# Patient Record
Sex: Male | Born: 1965 | Race: Black or African American | Hispanic: No | Marital: Single | Smoking: Never smoker
Health system: Southern US, Community
[De-identification: ages and names within clinical notes are randomized; demographics above are authoritative.]

## PROBLEM LIST (undated history)

## (undated) DIAGNOSIS — G709 Myoneural disorder, unspecified: Secondary | ICD-10-CM

## (undated) DIAGNOSIS — E11610 Type 2 diabetes mellitus with diabetic neuropathic arthropathy: Secondary | ICD-10-CM

## (undated) DIAGNOSIS — K219 Gastro-esophageal reflux disease without esophagitis: Secondary | ICD-10-CM

## (undated) DIAGNOSIS — K5900 Constipation, unspecified: Secondary | ICD-10-CM

## (undated) DIAGNOSIS — E785 Hyperlipidemia, unspecified: Secondary | ICD-10-CM

## (undated) DIAGNOSIS — I509 Heart failure, unspecified: Secondary | ICD-10-CM

## (undated) DIAGNOSIS — N189 Chronic kidney disease, unspecified: Secondary | ICD-10-CM

## (undated) DIAGNOSIS — R519 Headache, unspecified: Secondary | ICD-10-CM

## (undated) DIAGNOSIS — I10 Essential (primary) hypertension: Secondary | ICD-10-CM

## (undated) HISTORY — DX: Chronic kidney disease, unspecified: N18.9

## (undated) HISTORY — DX: Constipation, unspecified: K59.00

## (undated) HISTORY — PX: CARDIAC CATHETERIZATION: SHX172

## (undated) HISTORY — DX: Hyperlipidemia, unspecified: E78.5

## (undated) HISTORY — DX: Heart failure, unspecified: I50.9

## (undated) HISTORY — DX: Myoneural disorder, unspecified: G70.9

## (undated) HISTORY — DX: Gastro-esophageal reflux disease without esophagitis: K21.9

---

## 1998-10-17 ENCOUNTER — Emergency Department (HOSPITAL_COMMUNITY): Admission: EM | Admit: 1998-10-17 | Discharge: 1998-10-17 | Payer: Self-pay | Admitting: Emergency Medicine

## 1998-11-30 ENCOUNTER — Encounter: Admission: RE | Admit: 1998-11-30 | Discharge: 1998-11-30 | Payer: Self-pay | Admitting: Internal Medicine

## 2000-12-30 HISTORY — PX: UPPER GASTROINTESTINAL ENDOSCOPY: SHX188

## 2001-01-29 ENCOUNTER — Ambulatory Visit (HOSPITAL_COMMUNITY): Admission: RE | Admit: 2001-01-29 | Discharge: 2001-01-29 | Payer: Self-pay | Admitting: Gastroenterology

## 2001-03-31 ENCOUNTER — Emergency Department (HOSPITAL_COMMUNITY): Admission: EM | Admit: 2001-03-31 | Discharge: 2001-03-31 | Payer: Self-pay | Admitting: Emergency Medicine

## 2001-03-31 ENCOUNTER — Encounter: Payer: Self-pay | Admitting: Emergency Medicine

## 2011-07-17 ENCOUNTER — Observation Stay (HOSPITAL_COMMUNITY)
Admission: EM | Admit: 2011-07-17 | Discharge: 2011-07-18 | Disposition: A | Discharge status: Home / Self Care | Payer: Self-pay | Attending: Emergency Medicine | Admitting: Emergency Medicine

## 2011-07-17 DIAGNOSIS — L03221 Cellulitis of neck: Secondary | ICD-10-CM | POA: Insufficient documentation

## 2011-07-17 DIAGNOSIS — E119 Type 2 diabetes mellitus without complications: Secondary | ICD-10-CM | POA: Insufficient documentation

## 2011-07-17 DIAGNOSIS — L0211 Cutaneous abscess of neck: Principal | ICD-10-CM | POA: Insufficient documentation

## 2011-07-17 LAB — GLUCOSE, CAPILLARY: Glucose-Capillary: 164 mg/dL — ABNORMAL HIGH (ref 70–99)

## 2011-07-18 LAB — URINALYSIS, ROUTINE W REFLEX MICROSCOPIC
Leukocytes, UA: NEGATIVE
Protein, ur: NEGATIVE mg/dL
Urobilinogen, UA: 0.2 mg/dL (ref 0.0–1.0)

## 2011-07-18 LAB — DIFFERENTIAL
Basophils Absolute: 0 10*3/uL (ref 0.0–0.1)
Basophils Relative: 0 % (ref 0–1)
Eosinophils Absolute: 0.1 10*3/uL (ref 0.0–0.7)
Eosinophils Relative: 1 % (ref 0–5)
Lymphocytes Relative: 17 % (ref 12–46)
Lymphs Abs: 1.6 10*3/uL (ref 0.7–4.0)
Monocytes Absolute: 0.6 10*3/uL (ref 0.1–1.0)
Monocytes Relative: 7 % (ref 3–12)
Neutro Abs: 6.8 10*3/uL (ref 1.7–7.7)
Neutrophils Relative %: 74 % (ref 43–77)

## 2011-07-18 LAB — GLUCOSE, CAPILLARY
Glucose-Capillary: 317 mg/dL — ABNORMAL HIGH (ref 70–99)
Glucose-Capillary: 302 mg/dL — ABNORMAL HIGH (ref 70–99)

## 2011-07-18 LAB — BASIC METABOLIC PANEL
BUN: 16 mg/dL (ref 6–23)
CO2: 24 mEq/L (ref 19–32)
Calcium: 9.4 mg/dL (ref 8.4–10.5)
Chloride: 97 mEq/L (ref 96–112)
Creatinine, Ser: 1.09 mg/dL (ref 0.50–1.35)
GFR calc Af Amer: >60 mL/min (ref >60)
GFR calc non Af Amer: >60 mL/min (ref >60)
Glucose, Bld: 352 mg/dL — ABNORMAL HIGH (ref 70–99)
Potassium: 4.3 mEq/L (ref 3.5–5.1)
Sodium: 132 mEq/L — ABNORMAL LOW (ref 135–145)

## 2011-07-18 LAB — CBC
HCT: 41.7 % (ref 39.0–52.0)
Hemoglobin: 14.6 g/dL (ref 13.0–17.0)
MCH: 28.6 pg (ref 26.0–34.0)
MCHC: 35 g/dL (ref 30.0–36.0)
MCV: 81.6 fL (ref 78.0–100.0)
Platelets: 353 10*3/uL (ref 150–400)
RBC: 5.11 MIL/uL (ref 4.22–5.81)
RDW: 13.6 % (ref 11.5–15.5)
WBC: 9.1 10*3/uL (ref 4.0–10.5)

## 2011-07-18 LAB — URINE MICROSCOPIC-ADD ON

## 2011-07-19 LAB — URINE CULTURE
Culture  Setup Time: 201207191221
Culture: NO GROWTH

## 2012-05-04 ENCOUNTER — Emergency Department (HOSPITAL_COMMUNITY)
Admission: EM | Admit: 2012-05-04 | Discharge: 2012-05-04 | Disposition: A | Discharge status: Home / Self Care | Payer: Self-pay | Attending: Emergency Medicine | Admitting: Emergency Medicine

## 2012-05-04 ENCOUNTER — Encounter (HOSPITAL_COMMUNITY): Payer: Self-pay | Admitting: Adult Health

## 2012-05-04 DIAGNOSIS — L0291 Cutaneous abscess, unspecified: Secondary | ICD-10-CM | POA: Insufficient documentation

## 2012-05-04 DIAGNOSIS — E1169 Type 2 diabetes mellitus with other specified complication: Secondary | ICD-10-CM | POA: Insufficient documentation

## 2012-05-04 DIAGNOSIS — I10 Essential (primary) hypertension: Secondary | ICD-10-CM | POA: Insufficient documentation

## 2012-05-04 DIAGNOSIS — L0390 Cellulitis, unspecified: Secondary | ICD-10-CM

## 2012-05-04 DIAGNOSIS — E11621 Type 2 diabetes mellitus with foot ulcer: Secondary | ICD-10-CM

## 2012-05-04 DIAGNOSIS — Z79899 Other long term (current) drug therapy: Secondary | ICD-10-CM | POA: Insufficient documentation

## 2012-05-04 DIAGNOSIS — L97509 Non-pressure chronic ulcer of other part of unspecified foot with unspecified severity: Secondary | ICD-10-CM | POA: Insufficient documentation

## 2012-05-04 HISTORY — DX: Essential (primary) hypertension: I10

## 2012-05-04 LAB — POCT I-STAT, CHEM 8
Calcium, Ion: 1.23 mmol/L (ref 1.12–1.32)
Chloride: 108 mEq/L (ref 96–112)
HCT: 42 % (ref 39.0–52.0)
Hemoglobin: 14.3 g/dL (ref 13.0–17.0)
TCO2: 26 mmol/L (ref 0–100)

## 2012-05-04 LAB — CBC
HCT: 41.3 % (ref 39.0–52.0)
MCH: 28.1 pg (ref 26.0–34.0)
MCHC: 33.2 g/dL (ref 30.0–36.0)
RDW: 14.2 % (ref 11.5–15.5)

## 2012-05-04 LAB — DIFFERENTIAL
Basophils Absolute: 0.1 10*3/uL (ref 0.0–0.1)
Basophils Relative: 1 % (ref 0–1)
Eosinophils Absolute: 0.2 10*3/uL (ref 0.0–0.7)
Monocytes Absolute: 0.5 10*3/uL (ref 0.1–1.0)
Neutro Abs: 3.5 10*3/uL (ref 1.7–7.7)

## 2012-05-04 MED ORDER — CEPHALEXIN 500 MG PO CAPS: 500.0000 mg | ORAL_CAPSULE | Freq: Four times a day (QID) | ORAL | Status: AC

## 2012-05-04 MED ORDER — SULFAMETHOXAZOLE-TMP DS 800-160 MG PO TABS
1.0000 | ORAL_TABLET | Freq: Once | ORAL | Status: AC
  Administered 2012-05-04: 1 via ORAL
  Filled 2012-05-04: qty 1

## 2012-05-04 MED ORDER — SULFAMETHOXAZOLE-TRIMETHOPRIM 800-160 MG PO TABS: 1.0000 | ORAL_TABLET | Freq: Two times a day (BID) | ORAL | Status: AC

## 2012-05-04 MED ORDER — CEPHALEXIN 250 MG PO CAPS
500.0000 mg | ORAL_CAPSULE | Freq: Once | ORAL | Status: AC
  Administered 2012-05-04: 500 mg via ORAL
  Filled 2012-05-04: qty 2

## 2012-05-04 NOTE — ED Notes (Signed)
Bilateral ankle swelling that began Friday, able to ambulate well, denies pain. CMS intact. Denies SOB and CP.

## 2012-05-04 NOTE — ED Notes (Signed)
Pt requesting something to eat and drink.  Before giving pain med asked pt who was driving st's she had to call when she was discharged.  Explained to pt that she would not be able to drive after narcotics was given.  Pt voices understanding.  Pt requesting to know what kind of pain med she was getting and why was I diluting it.

## 2012-05-04 NOTE — Discharge Instructions (Signed)

## 2012-05-04 NOTE — ED Provider Notes (Addendum)
This chart was scribed for Gwyneth Sprout, MD by Williemae Natter. The patient was seen in room STRE3/STRE3 at 5:32 PM.  History     CSN: [id number]  Arrival date & time 05/04/12  1607   First MD Initiated Contact with Patient 05/04/12 1730      Chief Complaint  Patient presents with  . Ankle Pain    (Consider location/radiation/quality/duration/timing/severity/associated sxs/prior treatment) HPI Raymond Ryan is a 46 y.o. male with a hx of diabetes who presents to the Emergency Department complaining of bilateral ankle swelling that started 2 days ago. Pt was wearing work boots that scraped the skin on outer ankles shortly before onset.  Pt does not have a fever but has a headache.  Past Medical History  Diagnosis Date  . Diabetes mellitus   . Hypertension     History reviewed. No pertinent past surgical history.  History reviewed. No pertinent family history.  History  Substance Use Topics  . Smoking status: Never Smoker   . Smokeless tobacco: Not on file  . Alcohol Use: No      Review of Systems  Constitutional: Negative for fever and chills.  Respiratory: Negative for shortness of breath.   Cardiovascular: Positive for leg swelling.  Gastrointestinal: Negative for nausea and vomiting.  Neurological: Negative for weakness.    Allergies  Review of patient's allergies indicates no known allergies.  Home Medications   Current Outpatient Rx  Name Route Sig Dispense Refill  . GLIPIZIDE 10 MG PO TABS Oral Take 10 mg by mouth 2 (two) times daily before a meal.    . LISINOPRIL 10 MG PO TABS Oral Take 10 mg by mouth daily.    Marland Kitchen METFORMIN HCL 1000 MG PO TABS Oral Take 1,000 mg by mouth 2 (two) times daily with a meal.      BP 155/99  Pulse 90  Temp(Src) 98.4 F (36.9 C) (Oral)  Resp 20  SpO2 97%  Physical Exam  Nursing note and vitals reviewed. Constitutional: He is oriented to person, place, and time. He appears well-developed and well-nourished. No  distress.  HENT:  Head: Normocephalic and atraumatic.  Eyes: EOM are normal.  Neck: Normal range of motion. Neck supple. No tracheal deviation present.  Cardiovascular: Normal rate, regular rhythm and normal heart sounds.   Pulmonary/Chest: Effort normal and breath sounds normal. No respiratory distress.  Musculoskeletal: Normal range of motion. He exhibits edema (non pitting edema in bilateral ankles and feet. ). He exhibits no tenderness.       Dime sized lesions on bilateral lateral malleoli Mild erythema and warmth on rt foot  Neurological: He is alert and oriented to person, place, and time.  Skin: Skin is warm and dry.  Psychiatric: He has a normal mood and affect. His behavior is normal.    ED Course  Procedures (including critical care time)  Labs Reviewed  POCT I-STAT, CHEM 8 - Abnormal; Notable for the following:    Glucose, Bld 177 (*)    All other components within normal limits  CBC  DIFFERENTIAL   No results found.   No diagnosis found.    MDM   Patient who is diabetic who now has 2 lesions on bilateral ankles after wearing tight fitting shoes. The right foot is swollen and mild erythema with some cellulitis. There's no drainage from either lesion patient is having no systemic symptoms and labs are within normal limits. Start patient on Keflex and Bactrim to cover or strep and staph. Patient was  instructed to not wear any tight fitting shoes and to elevate his foot when he can. He is to return here in 2 days for a recheck to insure that the erythema and swelling is improving.  I personally performed the services described in this documentation, which was scribed in my presence.  The recorded information has been reviewed and considered.        Gwyneth Sprout, MD 05/04/12 1914  Gwyneth Sprout, MD 05/04/12 1850

## 2012-05-04 NOTE — ED Notes (Signed)
PT st's he had been wearing boots at this job now has wound on right ankle with swelling.  Pt denies pain

## 2012-05-07 ENCOUNTER — Encounter (HOSPITAL_COMMUNITY): Payer: Self-pay | Admitting: Emergency Medicine

## 2012-05-07 ENCOUNTER — Emergency Department (HOSPITAL_COMMUNITY)
Admission: EM | Admit: 2012-05-07 | Discharge: 2012-05-07 | Disposition: A | Discharge status: Home / Self Care | Payer: Self-pay | Attending: Emergency Medicine | Admitting: Emergency Medicine

## 2012-05-07 DIAGNOSIS — I10 Essential (primary) hypertension: Secondary | ICD-10-CM | POA: Insufficient documentation

## 2012-05-07 DIAGNOSIS — L97509 Non-pressure chronic ulcer of other part of unspecified foot with unspecified severity: Secondary | ICD-10-CM | POA: Insufficient documentation

## 2012-05-07 DIAGNOSIS — R609 Edema, unspecified: Secondary | ICD-10-CM | POA: Insufficient documentation

## 2012-05-07 DIAGNOSIS — E1169 Type 2 diabetes mellitus with other specified complication: Secondary | ICD-10-CM | POA: Insufficient documentation

## 2012-05-07 DIAGNOSIS — E11621 Type 2 diabetes mellitus with foot ulcer: Secondary | ICD-10-CM

## 2012-05-07 LAB — GLUCOSE, CAPILLARY: Glucose-Capillary: 200 mg/dL — ABNORMAL HIGH (ref 70–99)

## 2012-05-07 NOTE — ED Notes (Signed)
PT. REPORTS PERSISTENT PAIN / SWELLING / DRAINAGE AT RIGHT LATERAL DIABETIC ANKLE ULCER , SEEN HERE LAST Tuesday PRESCRIBED WITH KEFLEX WITH NO IMPROVEMENT.

## 2012-05-07 NOTE — Discharge Instructions (Signed)
Follow up with your family doctor.  If you need to find a new family doctor, call [facility], [phone] for assistance.  There are several doctors in [place] who see patient's who do not have medical insurance.  Please return to the ER if you develop fever, shortness of breath, Diabetes and Foot Care Diabetes may cause you to have a poor blood supply (circulation) to your legs and feet. Because of this, the skin may be thinner, break easier, and heal more slowly. You also may have nerve damage in your legs and feet causing decreased feeling. You may not notice minor injuries to your feet that could lead to serious problems or infections. Taking care of your feet is one of the most important things you can do for yourself.  HOME CARE INSTRUCTIONS  Do not go barefoot. Bare feet are easily injured.   Check your feet daily for blisters, cuts, and redness.   Wash your feet with warm water (not hot) and mild soap. Pat your feet and between your toes until completely dry.   Apply a moisturizing lotion that does not contain alcohol or petroleum jelly to the dry skin on your feet and to dry brittle toenails. Do not put it between your toes.   Trim your toenails straight across. Do not dig under them or around the cuticle.   Do not cut corns or calluses, or try to remove them with medicine.   Wear clean cotton socks or stockings every day. Make sure they are not too tight. Do not wear knee high stockings since they may decrease blood flow to your legs.   Wear leather shoes that fit properly and have enough cushioning. To break in new shoes, wear them just a few hours a day to avoid injuring your feet.   Wear shoes at all times, even in the house.   Do not cross your legs. This may decrease the blood flow to your feet.   If you find a minor scrape, cut, or break in the skin on your feet, keep it and the skin around it clean and dry. These areas may be cleansed with mild soap and water. Do not use  peroxide, alcohol, iodine or Merthiolate.   When you remove an adhesive bandage, be sure not to harm the skin around it.   If you have a wound, look at it several times a day to make sure it is healing.   Do not use heating pads or hot water bottles. Burns can occur. If you have lost feeling in your feet or legs, you may not know it is happening until it is too late.   Report any cuts, sores or bruises to your caregiver. Do not wait!  SEEK MEDICAL CARE IF:   You have an injury that is not healing or you notice redness, numbness, burning, or tingling.   Your feet always feel cold.   You have pain or cramps in your legs and feet.  SEEK IMMEDIATE MEDICAL CARE IF:   There is increasing redness, swelling, or increasing pain in the wound.   There is a red line that goes up your leg.   Pus is coming from a wound.   You develop an unexplained oral temperature above 102 F (38.9 C), or as your caregiver suggests.   You notice a bad smell coming from an ulcer or wound.  MAKE SURE YOU:   Understand these instructions.   Will watch your condition.   Will get help right away  if you are not doing well or get worse.  Document Released: 12/13/2000 Document Revised: 12/05/2011 Document Reviewed: 06/21/2009 [employer] Patient Information 2012 [employer].worsening swelling, redness of skin around wounds or drainage of pus from wounds.

## 2012-05-07 NOTE — ED Notes (Signed)
The patient's CBG was 200.

## 2012-05-07 NOTE — ED Provider Notes (Signed)
History     CSN: [id number]  Arrival date & time 05/07/12  4098   First MD Initiated Contact with Patient 05/07/12 [phone]      Chief Complaint  Patient presents with  . Wound Infection    (Consider location/radiation/quality/duration/timing/severity/associated sxs/prior treatment) HPI History provided by pt and prior chart.  Per prior chart, pt presented to ED 2 days ago w/ 2 days of bilateral ankle edema and diabetic ulcers of lateral malleoli that were attributed to tight-fitting work boots.  On exam, pt had bilateral cellulitis of right ankle and was d/c'd home w/ bactrim and keflex.  BUN/Cr were normal.  Pt returns today for recheck.  He has been compliant w/ abx and foot elevation.  Reports that his swelling is mildly improved.  There is no pain in his feet/ankles but he has peripheral neuropathy.  No drainage from wounds.  Has not had fever, cough or SOB.  No h/o CHF.   Past Medical History  Diagnosis Date  . Diabetes mellitus   . Hypertension     History reviewed. No pertinent past surgical history.  No family history on file.  History  Substance Use Topics  . Smoking status: Never Smoker   . Smokeless tobacco: Not on file  . Alcohol Use: No      Review of Systems  All other systems reviewed and are negative.    Allergies  Review of patient's allergies indicates no known allergies.  Home Medications   Current Outpatient Rx  Name Route Sig Dispense Refill  . CEPHALEXIN 500 MG PO CAPS Oral Take 1 capsule (500 mg total) by mouth 4 (four) times daily. 20 capsule 0  . GLIPIZIDE 10 MG PO TABS Oral Take 10 mg by mouth 2 (two) times daily before a meal.    . LISINOPRIL 10 MG PO TABS Oral Take 10 mg by mouth daily.    Marland Kitchen METFORMIN HCL 1000 MG PO TABS Oral Take 1,000 mg by mouth 2 (two) times daily with a meal.    . SULFAMETHOXAZOLE-TRIMETHOPRIM 800-160 MG PO TABS Oral Take 1 tablet by mouth every 12 (twelve) hours. 10 tablet 0    BP 134/85  Pulse 88  Temp(Src) 98.2  F (36.8 C) (Oral)  Resp 14  SpO2 100%  Physical Exam  Nursing note and vitals reviewed. Constitutional: He is oriented to person, place, and time. He appears well-developed and well-nourished. No distress.  HENT:  Head: Normocephalic and atraumatic.  Eyes:       Normal appearance  Neck: Normal range of motion.  Cardiovascular: Normal rate and regular rhythm.   Pulmonary/Chest: Effort normal and breath sounds normal. No respiratory distress. He has no rales.  Musculoskeletal: Normal range of motion.       Bilateral 2+ ankle edema.  Shallow, 2cm ulcerations w/out drainage or surrounding erythema/edema bilateral lateral malleoli.  Ankles and feet non-tender.  2+ DP pulses.  No sensation in toes which patient reports is chronic.    Neurological: He is alert and oriented to person, place, and time.  Skin: Skin is warm and dry. No rash noted.  Psychiatric: He has a normal mood and affect. His behavior is normal.    ED Course  Procedures (including critical care time)  Labs Reviewed - No data to display No results found.   1. Diabetic foot ulcer   2. Peripheral edema       MDM  45yo M presents for cellulitis recheck.  Has small, shallow diabetic ulcerations bilateral malleoli and  symmetric foot/ankle edema.  2 days ago, there was erythema of right foot and lateral malleolus.  Pt has been compliant w/ abx and this appears to have resolved.  He reports that his swelling is mildly improved as well.  No recent fever nor SOB/cough.  Basic labs checked 2 days ago and unremarkable.  Pt d/c'd home w/ recommendation to continue abx and leg elevation and f/u with his PCP.  Advised to return if swelling works or he develops SOB.          Otilio Miu, [place] 05/07/12 [phone]

## 2012-05-07 NOTE — ED Notes (Signed)
PA back at bedside to answer questions.

## 2012-05-08 NOTE — ED Provider Notes (Signed)
Medical screening examination/treatment/procedure(s) were performed by non-physician practitioner and as supervising physician I was immediately available for consultation/collaboration.   Glynn Octave, MD 05/08/12 [phone]

## 2012-09-06 ENCOUNTER — Encounter (HOSPITAL_COMMUNITY): Payer: Self-pay | Admitting: Emergency Medicine

## 2012-09-06 ENCOUNTER — Emergency Department (HOSPITAL_COMMUNITY)
Admission: EM | Admit: 2012-09-06 | Discharge: 2012-09-07 | Disposition: A | Discharge status: Home / Self Care | Payer: Self-pay | Attending: Emergency Medicine | Admitting: Emergency Medicine

## 2012-09-06 DIAGNOSIS — I10 Essential (primary) hypertension: Secondary | ICD-10-CM | POA: Insufficient documentation

## 2012-09-06 DIAGNOSIS — S93409A Sprain of unspecified ligament of unspecified ankle, initial encounter: Secondary | ICD-10-CM | POA: Insufficient documentation

## 2012-09-06 DIAGNOSIS — E119 Type 2 diabetes mellitus without complications: Secondary | ICD-10-CM | POA: Insufficient documentation

## 2012-09-06 DIAGNOSIS — W19XXXA Unspecified fall, initial encounter: Secondary | ICD-10-CM | POA: Insufficient documentation

## 2012-09-06 NOTE — ED Notes (Addendum)
C/o R foot "heaviness" and swelling since Friday.  No known injury.

## 2012-09-06 NOTE — ED Notes (Signed)
Pt states tingling in fingers, and right sided edema RLE foot and ankle

## 2012-09-07 ENCOUNTER — Emergency Department (HOSPITAL_COMMUNITY): Payer: Self-pay

## 2012-09-07 LAB — BASIC METABOLIC PANEL
Calcium: 10 mg/dL (ref 8.4–10.5)
Creatinine, Ser: 1.4 mg/dL — ABNORMAL HIGH (ref 0.50–1.35)
GFR calc non Af Amer: 59 mL/min — ABNORMAL LOW (ref >90)
Sodium: 141 mEq/L (ref 135–145)

## 2012-09-07 LAB — CBC WITH DIFFERENTIAL/PLATELET
Basophils Absolute: 0.1 10*3/uL (ref 0.0–0.1)
Basophils Relative: 1 % (ref 0–1)
Eosinophils Absolute: 0.2 10*3/uL (ref 0.0–0.7)
Eosinophils Relative: 2 % (ref 0–5)
HCT: 41.4 % (ref 39.0–52.0)
MCH: 28.8 pg (ref 26.0–34.0)
MCHC: 34.1 g/dL (ref 30.0–36.0)
MCV: 84.5 fL (ref 78.0–100.0)
Monocytes Absolute: 0.5 10*3/uL (ref 0.1–1.0)
Platelets: 356 10*3/uL (ref 150–400)
RDW: 14.3 % (ref 11.5–15.5)
WBC: 8.4 10*3/uL (ref 4.0–10.5)

## 2012-09-07 LAB — GLUCOSE, CAPILLARY: Glucose-Capillary: 80 mg/dL (ref 70–99)

## 2012-09-07 MED ORDER — HYDROCODONE-ACETAMINOPHEN 5-500 MG PO TABS: 1.0000 | ORAL_TABLET | Freq: Four times a day (QID) | ORAL | Status: AC | PRN

## 2012-09-07 NOTE — ED Notes (Signed)
CBG: 80 

## 2012-09-07 NOTE — ED Provider Notes (Signed)
History     CSN: [id number]  Arrival date & time 09/06/12  2044   First MD Initiated Contact with Patient 09/07/12 0050      Chief Complaint  Patient presents with  . Foot Swelling    (Consider location/radiation/quality/duration/timing/severity/associated sxs/prior treatment) HPI  Patient presents to the emergency department with right ankle and foot swelling. The patient is a diabetic and also has hypertension. He has been known to have wound infections to his feet as he has diabetic neuropathy. Patient states that the couple of days ago he fell backwards a little bit at work. But since he is unable to feel his feet very well he is not sure if he twisted his ankle or foot or not. He has not had any fevers, weakness, diarrhea, nausea, vomiting. He informs me that he checks his feet every day and does not have any wounds at this time. He is limping on ambulation. He informs me he does not necessarily because of the pain but because it is swollen and it feels tight to walk on. The patient's vital signs are stable he is in no acute distress  Past Medical History  Diagnosis Date  . Diabetes mellitus   . Hypertension     History reviewed. No pertinent past surgical history.  No family history on file.  History  Substance Use Topics  . Smoking status: Never Smoker   . Smokeless tobacco: Not on file  . Alcohol Use: No      Review of Systems  Review of Systems  Gen: no weight loss, fevers, chills, night sweats  Eyes: no discharge or drainage, no occular pain or visual changes  Nose: no epistaxis or rhinorrhea  Mouth: no dental pain, no sore throat  Neck: no neck pain  Lungs:No wheezing, coughing or hemoptysis CV: no chest pain, palpitations, dependent edema or orthopnea  Abd: no abdominal pain, nausea, vomiting  GU: no dysuria or gross hematuria  MSK:  Left ankle swelling Neuro: no headache, no focal neurologic deficits  Skin: no abnormalities Psyche:  negative.   Allergies  Review of patient's allergies indicates no known allergies.  Home Medications   Current Outpatient Rx  Name Route Sig Dispense Refill  . GLIPIZIDE 10 MG PO TABS Oral Take 10 mg by mouth 2 (two) times daily before a meal.    . LISINOPRIL 10 MG PO TABS Oral Take 10 mg by mouth daily.    Marland Kitchen METFORMIN HCL 1000 MG PO TABS Oral Take 1,000 mg by mouth 2 (two) times daily with a meal.    . HYDROCODONE-ACETAMINOPHEN 5-500 MG PO TABS Oral Take 1-2 tablets by mouth every 6 (six) hours as needed for pain. 15 tablet 0    BP 108/69  Pulse 98  Temp 99.4 F (37.4 C) (Oral)  Resp 18  SpO2 97%  Physical Exam  Nursing note and vitals reviewed. Constitutional: He appears well-developed and well-nourished. No distress.  HENT:  Head: Normocephalic and atraumatic.  Eyes: Pupils are equal, round, and reactive to light.  Neck: Normal range of motion. Neck supple.  Cardiovascular: Normal rate and regular rhythm.   Pulmonary/Chest: Effort normal.  Abdominal: Soft.  Musculoskeletal:       Right foot: He exhibits tenderness and swelling. He exhibits normal range of motion, no bony tenderness, normal capillary refill, no crepitus, no deformity and no laceration.       Feet:       Decreased sensation to bilateral feet. No wounds noted. No induration, crepitus,  purulent discharge from any wounds. The foot does not feel feverish. Patient has some tenderness to the lateral malleolus.  Neurological: He is alert.  Skin: Skin is warm and dry.    ED Course  Procedures (including critical care time)  Labs Reviewed  BASIC METABOLIC PANEL - Abnormal; Notable for the following:    Creatinine, Ser 1.40 (*)     GFR calc non Af Amer 59 (*)     GFR calc Af Amer 69 (*)     All other components within normal limits  CBC WITH DIFFERENTIAL   Dg Ankle Complete Right  09/07/2012  *RADIOLOGY REPORT*  Clinical Data: Right foot swelling and ankle pain.  No known injury.  History of diabetes.   RIGHT ANKLE - COMPLETE 3+ VIEW  Comparison: None.  Findings: Diffuse soft tissue swelling, most pronounced medially. Irregularity of the superior aspect of the mid to distal talus without overlying soft tissue swelling.  No effusion.  Posterior calcaneal spur.  IMPRESSION:  1.  Diffuse soft tissue swelling, most pronounced medially. 2.  Superior talar irregularity, most likely due to a previous injury. 3.  Posterior calcaneal spur.   Original Report Authenticated By: Darrol Angel, M.D.      1. Ankle sprain       MDM  Patient's symptoms most consistent with sprain. On physical examination there are no wounds to bilateral feet. The foot is not indurated or erythematous could be suggestive of infection. I have headache in depth discussion with the patient about the chance that this could potential he turned into cellulitis and infection however that is not the case at this time. The most likely scenario is that he sprained his ankle.  We'll treat with ASO splint and have him followup with his primary care Dr. Patient is to return to the emergency department ASAP if he develops any fevers, induration, foot wound or erythema to the foot. The patient's sugars are 93 in the ER and the rest of his labs are physiologic for him.  Pt has been advised of the symptoms that warrant their return to the ED. Patient has voiced understanding and has agreed to follow-up with the PCP or specialist.        Dorthula Matas, PA 09/07/12 0127

## 2012-09-08 NOTE — ED Provider Notes (Signed)
Medical screening examination/treatment/procedure(s) were performed by non-physician practitioner and as supervising physician I was immediately available for consultation/collaboration.   Markise Haymer, MD 09/08/12 0730 

## 2012-10-30 ENCOUNTER — Emergency Department (HOSPITAL_COMMUNITY)
Admission: EM | Admit: 2012-10-30 | Discharge: 2012-10-30 | Disposition: A | Discharge status: Home / Self Care | Payer: Self-pay | Attending: Emergency Medicine | Admitting: Emergency Medicine

## 2012-10-30 ENCOUNTER — Encounter (HOSPITAL_COMMUNITY): Payer: Self-pay | Admitting: *Deleted

## 2012-10-30 DIAGNOSIS — M25476 Effusion, unspecified foot: Secondary | ICD-10-CM | POA: Insufficient documentation

## 2012-10-30 DIAGNOSIS — M7989 Other specified soft tissue disorders: Secondary | ICD-10-CM

## 2012-10-30 DIAGNOSIS — I10 Essential (primary) hypertension: Secondary | ICD-10-CM | POA: Insufficient documentation

## 2012-10-30 DIAGNOSIS — E119 Type 2 diabetes mellitus without complications: Secondary | ICD-10-CM | POA: Insufficient documentation

## 2012-10-30 DIAGNOSIS — M25473 Effusion, unspecified ankle: Secondary | ICD-10-CM | POA: Insufficient documentation

## 2012-10-30 DIAGNOSIS — L02619 Cutaneous abscess of unspecified foot: Secondary | ICD-10-CM | POA: Insufficient documentation

## 2012-10-30 DIAGNOSIS — L03115 Cellulitis of right lower limb: Secondary | ICD-10-CM

## 2012-10-30 DIAGNOSIS — M79609 Pain in unspecified limb: Secondary | ICD-10-CM

## 2012-10-30 LAB — GLUCOSE, CAPILLARY: Glucose-Capillary: 111 mg/dL — ABNORMAL HIGH (ref 70–99)

## 2012-10-30 LAB — CBC WITH DIFFERENTIAL/PLATELET
HCT: 37.2 % — ABNORMAL LOW (ref 39.0–52.0)
Hemoglobin: 12.4 g/dL — ABNORMAL LOW (ref 13.0–17.0)
Lymphocytes Relative: 20 % (ref 12–46)
Monocytes Absolute: 1.2 10*3/uL — ABNORMAL HIGH (ref 0.1–1.0)
Monocytes Relative: 12 % (ref 3–12)
Neutro Abs: 6.3 10*3/uL (ref 1.7–7.7)
Neutrophils Relative %: 63 % (ref 43–77)
RBC: 4.41 MIL/uL (ref 4.22–5.81)
WBC: 9.9 10*3/uL (ref 4.0–10.5)

## 2012-10-30 LAB — BASIC METABOLIC PANEL
BUN: 32 mg/dL — ABNORMAL HIGH (ref 6–23)
CO2: 27 mEq/L (ref 19–32)
Chloride: 99 mEq/L (ref 96–112)
Creatinine, Ser: 1.76 mg/dL — ABNORMAL HIGH (ref 0.50–1.35)
Potassium: 3.9 mEq/L (ref 3.5–5.1)

## 2012-10-30 LAB — D-DIMER, QUANTITATIVE: D-Dimer, Quant: 6.45 ug/mL-FEU — ABNORMAL HIGH (ref 0.00–0.48)

## 2012-10-30 MED ORDER — CLINDAMYCIN HCL 150 MG PO CAPS: 450.0000 mg | ORAL_CAPSULE | Freq: Three times a day (TID) | ORAL | Status: DC

## 2012-10-30 MED ORDER — SODIUM CHLORIDE 0.9 % IV BOLUS (SEPSIS): 500.0000 mL | Freq: Once | INTRAVENOUS | Status: DC

## 2012-10-30 MED ORDER — OXYCODONE-ACETAMINOPHEN 5-325 MG PO TABS: ORAL_TABLET | ORAL | Status: DC

## 2012-10-30 MED ORDER — SODIUM CHLORIDE 0.9 % IV BOLUS (SEPSIS)
1000.0000 mL | Freq: Once | INTRAVENOUS | Status: AC
  Administered 2012-10-30: 1000 mL via INTRAVENOUS

## 2012-10-30 MED ORDER — OXYCODONE-ACETAMINOPHEN 5-325 MG PO TABS
2.0000 | ORAL_TABLET | Freq: Once | ORAL | Status: AC
  Administered 2012-10-30: 2 via ORAL
  Filled 2012-10-30: qty 2

## 2012-10-30 MED ORDER — CLINDAMYCIN PHOSPHATE 600 MG/50ML IV SOLN
600.0000 mg | Freq: Once | INTRAVENOUS | Status: AC
Start: 2012-10-30 — End: 2012-10-30
  Administered 2012-10-30: 600 mg via INTRAVENOUS
  Filled 2012-10-30: qty 50

## 2012-10-30 NOTE — ED Provider Notes (Signed)
History     CSN: [id number]  Arrival date & time 10/30/12  0603   First MD Initiated Contact with Patient 10/30/12 [phone]      Chief Complaint  Patient presents with  . Foot Pain    (Consider location/radiation/quality/duration/timing/severity/associated sxs/prior treatment) HPI Comments: Patient with a history of DM comes in today with a chief complaint of pain, swelling, and erythema of his right foot and ankle.  He reports that his symptoms have been present for the past week and are gradually worsening.  He states that he was seen for something similar in the ED last month.  At that time he was diagnosed with an ankle sprain and was given an Ankle ASO.  He reports that the swelling improved after that, but then returned again one week ago.  He denies any recent injury or trauma.  He does have a history of DM and currently takes Metformin and Glipizide.  He does not check his blood sugars at home.  He does not have a PCP.  He denies any recent prolonged travel, surgeries in the past 4 weeks, prior history of DVT or PE, or history of Cancer.  No fever or chills.  He has been able to ambulate, but increased pain with ambulation.  The history is provided by the patient.    Past Medical History  Diagnosis Date  . Diabetes mellitus   . Hypertension     History reviewed. No pertinent past surgical history.  No family history on file.  History  Substance Use Topics  . Smoking status: Never Smoker   . Smokeless tobacco: Not on file  . Alcohol Use: No      Review of Systems  Constitutional: Negative for fever and chills.  Musculoskeletal:       Swelling of right foot  Skin: Positive for color change.  Neurological: Positive for numbness.    Allergies  Review of patient's allergies indicates no known allergies.  Home Medications   Current Outpatient Rx  Name Route Sig Dispense Refill  . GLIPIZIDE 10 MG PO TABS Oral Take 10 mg by mouth 2 (two) times daily before a meal.      . HYDROCHLOROTHIAZIDE 25 MG PO TABS Oral Take 25 mg by mouth daily.    Marland Kitchen LISINOPRIL 10 MG PO TABS Oral Take 10 mg by mouth daily.    Marland Kitchen METFORMIN HCL 1000 MG PO TABS Oral Take 1,000 mg by mouth 2 (two) times daily with a meal.      BP 115/79  Pulse 95  Temp 98.1 F (36.7 C) (Oral)  Resp 16  SpO2 99%  Physical Exam  Nursing note and vitals reviewed. Constitutional: He appears well-developed and well-nourished. No distress.  HENT:  Head: Normocephalic and atraumatic.  Mouth/Throat: Oropharynx is clear and moist.  Cardiovascular: Normal rate, regular rhythm and normal heart sounds.   Pulmonary/Chest: Effort normal and breath sounds normal.  Musculoskeletal: Normal range of motion.       Patient with full ROM of the right ankle and able to wiggle all of his toes  Neurological: He is alert.       Decreased sensation of both feet  Skin: He is not diaphoretic.       Erythema, Edema, and Warmth of the dorsal aspect of the right foot, right ankle, and distal portion of the right leg Good capillary refill<2 seconds  Psychiatric: He has a normal mood and affect.    ED Course  Procedures (including critical care time)  Labs Reviewed  CBC WITH DIFFERENTIAL  BASIC METABOLIC PANEL   No results found.   No diagnosis found.    MDM  Patient presenting with a chief complaint of right foot pain, erythema, and swelling over the past week.  Area gradually worsening.  PMH significant for DM.  Patient given one dose of IV Clindamycin while in the ED.  D-dimer ordered to rule out DVT.  Patient moved to CDU and signed out to [facility] with the d-dimer pending.  The plan if for the patient to have a LE ultrasound if the d-dimer is elevated.  If d-dimer is negative the patient will be discharged home with antibiotics and recheck in 1-2 days.        Pascal Lux [place], [name] 10/30/12 1645

## 2012-10-30 NOTE — ED Notes (Signed)
POCT CBG resulted 111; Kelly notified

## 2012-10-30 NOTE — ED Notes (Signed)
Family at bedside. 

## 2012-10-30 NOTE — ED Provider Notes (Signed)
Medical screening examination/treatment/procedure(s) were conducted as a shared visit with non-physician practitioner(s) and myself.  I personally evaluated the patient during the encounter  Doug Sou, MD 10/30/12 1650

## 2012-10-30 NOTE — ED Provider Notes (Signed)
Raymond Ryan is a 46 y.o. male in CDU from pod A. Sign out from PA [place] as follows: Plan is to followup d-dimer results [id number] for right lower extremity DVT versus cellulitis. Patient has received one dose of IV clindamycin. No second dose of IV antibiotics is indicated at this time.  Patient seen and examined at the bedside resting comfortably, pain is moderate and patient refuses any pain control medications at this time. Right lower extremity erythematous, swollen, tender to palpation and warm; edema up to the level of the low calf. Patient denies any shortness of breath, lung sounds are clear to auscultation bilaterally, heart sounds are regular rate and rhythm with no murmur stroke or gallops, abdominal exam is benign with no tenderness to palpation or peritoneal signs.  D-dimer is elevated at greater than 6 venous Doppler will be ordered. Discussed results with patient.  Venous Doppler shows no signs of thrombus:  Author:  Kerrin Champagne  Service:  Vascular Lab  Author Type:  Cardiovascular Sonographer   Filed:  10/30/12 1234  Note Time:  10/30/12 1232          VASCULAR LAB  PRELIMINARY PRELIMINARY PRELIMINARY PRELIMINARY  Right lower extremity venous duplex completed.  Preliminary report: Right: No evidence of DVT, superficial thrombosis, or Baker's cyst. Enlargement of the inguinal lymph nodes noted.  SLAUGHTER, [place], RVS  10/30/2012, 12:33 PM    Pt verbalized understanding and agrees with care plan. Outpatient follow-up and return precautions given.    Patient will be given crutches, advised to elevate the leg and patient instructed to return for wound check in 24-48 hours.  New Prescriptions   CLINDAMYCIN (CLEOCIN) 150 MG CAPSULE    Take 3 capsules (450 mg total) by mouth 3 (three) times daily.   OXYCODONE-ACETAMINOPHEN (PERCOCET/ROXICET) 5-325 MG PER TABLET    1 to 2 tabs PO q6hrs  PRN for pain    [name], [name] 10/30/12 1444

## 2012-10-30 NOTE — Progress Notes (Signed)
Orthopedic Tech Progress Note Patient Details:  Raymond Ryan May 25, 1966 [id number] Patient issued crutches fitted for height and comfort. Patient demonstrated proper crutch use. Ortho Devices Type of Ortho Device: Crutches Ortho Device/Splint Interventions: Application   Asia R Thompson 10/30/2012, 4:01 PM

## 2012-10-30 NOTE — ED Notes (Signed)
Pt return from doppler.

## 2012-10-30 NOTE — ED Provider Notes (Signed)
Complains of painful swollen right lower extremity for one week pain started at medial ankle he describes pain is minimal feels like "a tightness" on exam right lower extremity 2+ edema red warm and tender at distal leg and dorsum of foot DP pulse 2+ no inguinal adenopathy  Doug Sou, MD 10/30/12 1054

## 2012-10-30 NOTE — Progress Notes (Signed)
VASCULAR LAB PRELIMINARY  PRELIMINARY  PRELIMINARY  PRELIMINARY  Right lower extremity venous duplex completed.    Preliminary report:  Right:  No evidence of DVT, superficial thrombosis, or Baker's cyst. Enlargement of the inguinal lymph nodes noted.  Wilsie Kern, RVS 10/30/2012, 12:33 PM

## 2012-10-30 NOTE — ED Notes (Signed)
Ordered diabetic tray

## 2012-10-30 NOTE — ED Provider Notes (Signed)
Medical screening examination/treatment/procedure(s) were conducted as a shared visit with non-physician practitioner(s) and myself.  I personally evaluated the patient during the encounter  Romilda Proby, MD 10/30/12 1650 

## 2012-10-30 NOTE — ED Notes (Signed)
Pt to ED c/o R foot pain and swelling.  He was tx here for a sprain to that same foot in Sept.  States he was d/c'd with a boot which ended up giving him a blister.  Pt states continued pain since then, but last week pain increased.  R foot swollen and red.  <2 cap refill.

## 2012-11-01 ENCOUNTER — Inpatient Hospital Stay (HOSPITAL_COMMUNITY)
Admission: EM | Admit: 2012-11-01 | Discharge: 2012-11-04 | DRG: 603 | Disposition: A | Discharge status: Home / Self Care | Payer: MEDICAID | Attending: Internal Medicine | Admitting: Internal Medicine

## 2012-11-01 ENCOUNTER — Encounter (HOSPITAL_COMMUNITY): Payer: Self-pay | Admitting: Physical Medicine and Rehabilitation

## 2012-11-01 DIAGNOSIS — I498 Other specified cardiac arrhythmias: Secondary | ICD-10-CM | POA: Diagnosis present

## 2012-11-01 DIAGNOSIS — N179 Acute kidney failure, unspecified: Secondary | ICD-10-CM | POA: Diagnosis present

## 2012-11-01 DIAGNOSIS — I10 Essential (primary) hypertension: Secondary | ICD-10-CM | POA: Diagnosis present

## 2012-11-01 DIAGNOSIS — L02619 Cutaneous abscess of unspecified foot: Principal | ICD-10-CM | POA: Diagnosis present

## 2012-11-01 DIAGNOSIS — L03119 Cellulitis of unspecified part of limb: Principal | ICD-10-CM

## 2012-11-01 DIAGNOSIS — E119 Type 2 diabetes mellitus without complications: Secondary | ICD-10-CM | POA: Diagnosis present

## 2012-11-01 DIAGNOSIS — L03115 Cellulitis of right lower limb: Secondary | ICD-10-CM

## 2012-11-01 DIAGNOSIS — A5211 Tabes dorsalis: Secondary | ICD-10-CM | POA: Diagnosis present

## 2012-11-01 LAB — BASIC METABOLIC PANEL
BUN: 22 mg/dL (ref 6–23)
CO2: 28 mEq/L (ref 19–32)
CO2: 29 mEq/L (ref 19–32)
Calcium: 9.9 mg/dL (ref 8.4–10.5)
Calcium: 9.6 mg/dL (ref 8.4–10.5)
Chloride: 98 mEq/L (ref 96–112)
Chloride: 99 mEq/L (ref 96–112)
Creatinine, Ser: 1.11 mg/dL (ref 0.50–1.35)
GFR calc Af Amer: >90 mL/min (ref >90)
GFR calc non Af Amer: 79 mL/min — ABNORMAL LOW (ref >90)
Glucose, Bld: 73 mg/dL (ref 70–99)
Glucose, Bld: 77 mg/dL (ref 70–99)
Potassium: 5.4 mEq/L — ABNORMAL HIGH (ref 3.5–5.1)
Potassium: 4.4 mEq/L (ref 3.5–5.1)
Sodium: 136 mEq/L (ref 135–145)
Sodium: 135 mEq/L (ref 135–145)

## 2012-11-01 LAB — CBC WITH DIFFERENTIAL/PLATELET
Basophils Absolute: 0.1 10*3/uL (ref 0.0–0.1)
Basophils Relative: 1 % (ref 0–1)
HCT: 38.6 % — ABNORMAL LOW (ref 39.0–52.0)
Hemoglobin: 13.1 g/dL (ref 13.0–17.0)
Lymphocytes Relative: 22 % (ref 12–46)
MCHC: 33.9 g/dL (ref 30.0–36.0)
Monocytes Absolute: 0.7 10*3/uL (ref 0.1–1.0)
Neutro Abs: 5.3 10*3/uL (ref 1.7–7.7)
Neutrophils Relative %: 65 % (ref 43–77)
RDW: 14.1 % (ref 11.5–15.5)
WBC: 8.2 10*3/uL (ref 4.0–10.5)

## 2012-11-01 MED ORDER — ACETAMINOPHEN 325 MG PO TABS: 650.0000 mg | ORAL_TABLET | Freq: Four times a day (QID) | ORAL | Status: DC | PRN

## 2012-11-01 MED ORDER — ACETAMINOPHEN 650 MG RE SUPP: 650.0000 mg | Freq: Four times a day (QID) | RECTAL | Status: DC | PRN

## 2012-11-01 MED ORDER — INSULIN ASPART 100 UNIT/ML ~~LOC~~ SOLN
0.0000 [IU] | Freq: Three times a day (TID) | SUBCUTANEOUS | Status: DC
  Administered 2012-11-02 – 2012-11-03 (×3): 1 [IU] via SUBCUTANEOUS

## 2012-11-01 MED ORDER — LISINOPRIL 10 MG PO TABS
10.0000 mg | ORAL_TABLET | Freq: Every day | ORAL | Status: DC
  Administered 2012-11-02 – 2012-11-04 (×3): 10 mg via ORAL
  Filled 2012-11-01 (×3): qty 1

## 2012-11-01 MED ORDER — ONDANSETRON HCL 4 MG/2ML IJ SOLN: 4.0000 mg | Freq: Four times a day (QID) | INTRAMUSCULAR | Status: DC | PRN

## 2012-11-01 MED ORDER — SODIUM CHLORIDE 0.9 % IV BOLUS (SEPSIS)
1000.0000 mL | Freq: Once | INTRAVENOUS | Status: AC
  Administered 2012-11-01: 1000 mL via INTRAVENOUS

## 2012-11-01 MED ORDER — ENOXAPARIN SODIUM 40 MG/0.4ML ~~LOC~~ SOLN
40.0000 mg | SUBCUTANEOUS | Status: DC
  Administered 2012-11-01 – 2012-11-03 (×3): 40 mg via SUBCUTANEOUS
  Filled 2012-11-01 (×4): qty 0.4

## 2012-11-01 MED ORDER — INFLUENZA VIRUS VACC SPLIT PF IM SUSP
0.5000 mL | INTRAMUSCULAR | Status: AC
  Administered 2012-11-02: 0.5 mL via INTRAMUSCULAR
  Filled 2012-11-01: qty 0.5

## 2012-11-01 MED ORDER — VANCOMYCIN HCL IN DEXTROSE 1-5 GM/200ML-% IV SOLN
1000.0000 mg | Freq: Two times a day (BID) | INTRAVENOUS | Status: DC
  Administered 2012-11-02 – 2012-11-04 (×4): 1000 mg via INTRAVENOUS
  Filled 2012-11-01 (×5): qty 200

## 2012-11-01 MED ORDER — SODIUM CHLORIDE 0.9 % IV SOLN
INTRAVENOUS | Status: AC
  Administered 2012-11-02: 12:00:00 via INTRAVENOUS

## 2012-11-01 MED ORDER — ONDANSETRON HCL 4 MG/2ML IJ SOLN: 4.0000 mg | Freq: Three times a day (TID) | INTRAMUSCULAR | Status: DC | PRN

## 2012-11-01 MED ORDER — CLINDAMYCIN PHOSPHATE 900 MG/50ML IV SOLN
900.0000 mg | Freq: Once | INTRAVENOUS | Status: AC
  Administered 2012-11-01: 900 mg via INTRAVENOUS
  Filled 2012-11-01: qty 50

## 2012-11-01 MED ORDER — VANCOMYCIN HCL 1000 MG IV SOLR
1500.0000 mg | INTRAVENOUS | Status: AC
  Administered 2012-11-01: 1500 mg via INTRAVENOUS
  Filled 2012-11-01: qty 1500

## 2012-11-01 MED ORDER — TETANUS-DIPHTH-ACELL PERTUSSIS 5-2.5-18.5 LF-MCG/0.5 IM SUSP
0.5000 mL | Freq: Once | INTRAMUSCULAR | Status: AC
  Administered 2012-11-02: 0.5 mL via INTRAMUSCULAR
  Filled 2012-11-01 (×2): qty 0.5

## 2012-11-01 MED ORDER — GLIPIZIDE 10 MG PO TABS
10.0000 mg | ORAL_TABLET | Freq: Two times a day (BID) | ORAL | Status: DC
  Administered 2012-11-02 – 2012-11-04 (×4): 10 mg via ORAL
  Filled 2012-11-01 (×7): qty 1

## 2012-11-01 MED ORDER — CIPROFLOXACIN IN D5W 400 MG/200ML IV SOLN
400.0000 mg | Freq: Two times a day (BID) | INTRAVENOUS | Status: DC
  Administered 2012-11-01 – 2012-11-03 (×5): 400 mg via INTRAVENOUS
  Filled 2012-11-01 (×6): qty 200

## 2012-11-01 MED ORDER — PNEUMOCOCCAL VAC POLYVALENT 25 MCG/0.5ML IJ INJ
0.5000 mL | INJECTION | INTRAMUSCULAR | Status: AC
  Administered 2012-11-02: 0.5 mL via INTRAMUSCULAR
  Filled 2012-11-01: qty 0.5

## 2012-11-01 MED ORDER — ONDANSETRON HCL 4 MG PO TABS: 4.0000 mg | ORAL_TABLET | Freq: Four times a day (QID) | ORAL | Status: DC | PRN

## 2012-11-01 MED ORDER — VANCOMYCIN HCL IN DEXTROSE 1-5 GM/200ML-% IV SOLN: 1000.0000 mg | Freq: Two times a day (BID) | INTRAVENOUS | Status: DC

## 2012-11-01 MED ORDER — VANCOMYCIN HCL 1000 MG IV SOLR
2500.0000 mg | Freq: Once | INTRAVENOUS | Status: DC
  Filled 2012-11-01: qty 2500

## 2012-11-01 MED ORDER — VANCOMYCIN HCL IN DEXTROSE 1-5 GM/200ML-% IV SOLN
1000.0000 mg | INTRAVENOUS | Status: AC
  Administered 2012-11-01: 1000 mg via INTRAVENOUS
  Filled 2012-11-01: qty 200

## 2012-11-01 MED ORDER — SODIUM CHLORIDE 0.9 % IV SOLN
INTRAVENOUS | Status: DC
  Administered 2012-11-01: 23:00:00 via INTRAVENOUS

## 2012-11-01 MED ORDER — OXYCODONE-ACETAMINOPHEN 5-325 MG PO TABS
1.0000 | ORAL_TABLET | Freq: Four times a day (QID) | ORAL | Status: DC | PRN
  Administered 2012-11-01 – 2012-11-03 (×3): 2 via ORAL
  Filled 2012-11-01 (×4): qty 2

## 2012-11-01 NOTE — ED Notes (Signed)
Pt presents to department for evaluation of R foot swelling and pain. Onset last week, was seen on 10/30/12 for same and prescribed Keflex. Pt states no relief from pain and swelling has increased. 10/10 pain upon arrival. Pedal pulses present, able to wiggle digits, swelling noted, foot also warm to touch. Pt using crutches at the time. He is alert and oriented x4.

## 2012-11-01 NOTE — Progress Notes (Addendum)
ANTIBIOTIC CONSULT NOTE - INITIAL  Pharmacy Consult for Vancomycin and Ciprofloxacin  Indication: R foot cellulitis   No Known Allergies  Patient Measurements: Height: 6\' 3"  (190.5 cm) Weight: 267 lb (121.11 kg) IBW/kg (Calculated) : 84.5   Vital Signs: Temp: 98.1 F (36.7 C) (11/03 2121) Temp src: Oral (11/03 2121) BP: 123/69 mmHg (11/03 2121) Pulse Rate: 79  (11/03 2121) Intake/Output from previous day:   Intake/Output from this shift:    Labs:  Basename 11/01/12 1820 11/01/12 1640 10/30/12 0749  WBC -- 8.2 9.9  HGB -- 13.1 12.4*  PLT -- 577* 425*  LABCREA -- -- --  CREATININE 1.15 1.11 1.76*   Estimated Creatinine Clearance: 113.7 ml/min (by C-G formula based on Cr of 1.15). No results found for this basename: VANCOTROUGH:2,VANCOPEAK:2,VANCORANDOM:2,GENTTROUGH:2,GENTPEAK:2,GENTRANDOM:2,TOBRATROUGH:2,TOBRAPEAK:2,TOBRARND:2,AMIKACINPEAK:2,AMIKACINTROU:2,AMIKACIN:2, in the last 72 hours   Microbiology: No results found for this or any previous visit (from the past 720 hour(s)).  Medical History: Past Medical History  Diagnosis Date  . Diabetes mellitus   . Hypertension    Assessment: Mr. Kauppila is a 83 yom recently [facility] for R foot cellulitis and discharged on po clindamycin. He present today with worsening R foot pain, swelling and redness to start vancomycin and ciprofloxacin per pharmacy. Pt estimated creatinine clearance is >197ml/min. WBC wnl and he is afebrile. No cultures have been drawn yet.   Noted he received 900mg  IV clindamycin as well as 1500mg  Vanc in the ED. Pt is 121kg so will require vanc loading dose.   Goal of Therapy:  Vancomycin trough level 10-15 mcg/ml  Plan:  To complete loading dose, give 1000mg  IV vancomycin x 1  Then vancomycin 1000mg  IV q12h Ciprofloxacin 400mg  IV q12h  F/u renal function, trough at steady state, and cultures  Thank you,  Brett Fairy, PharmD 11/01/2012 9:44 PM

## 2012-11-01 NOTE — H&P (Signed)
Raymond Ryan is an 46 y.o. male.   Patient was seen and examined on November 01, 2012. PCP - [facility] clinic. Chief Complaint: Swelling of the right ankle and foot. HPI: 46 year old male with history of hypertension and diabetes mellitus type 2 started experiencing swelling of his right ankle and foot 3 weeks ago. At that time he presented to the ER and had x-rays done and was told that he had a sprain. Patient over-the-counter Motrin for pain relief. 3 days ago the swelling worsened with fever and chills. Patient presented the ER again and at the time had Doppler of lower extremity done and DVT was negative. He was prescribed doxycycline and discharged home. Despite taking which patient still had worsening of swelling and pain so he returned back to the ER. At this time patient has been admitted for IV antibiotics for cellulitis. Patient denies any trauma or any sharp object piercing through his leg. In the ER initially patient was mildly tachycardic and was given a fluid bolus after which his heart rate became more stable and patient at this time does not look septic and will be admitted to medical floor. Patient has good range of joint movement. Pulses are felt.  Past Medical History  Diagnosis Date  . Diabetes mellitus   . Hypertension     History reviewed. No pertinent past surgical history.  Family History  Problem Relation Age of Onset  . Diabetes Mellitus II Mother   . CAD Mother    Social History:  reports that he has never smoked. He does not have any smokeless tobacco history on file. He reports that he does not drink alcohol or use illicit drugs.  Allergies: No Known Allergies   (Not in a hospital admission)  Results for orders placed during the hospital encounter of 11/01/12 (from the past 48 hour(s))  CBC WITH DIFFERENTIAL     Status: Abnormal   Collection Time   11/01/12  4:40 PM      Component Value Range Comment   WBC 8.2  4.0 - 10.5 K/uL    RBC 4.54  4.22 -  5.81 MIL/uL    Hemoglobin 13.1  13.0 - 17.0 g/dL    HCT 96.0 (*) 45.4 - 52.0 %    MCV 85.0  78.0 - 100.0 fL    MCH 28.9  26.0 - 34.0 pg    MCHC 33.9  30.0 - 36.0 g/dL    RDW 09.8  11.9 - 14.7 %    Platelets 577 (*) 150 - 400 K/uL    Neutrophils Relative 65  43 - 77 %    Neutro Abs 5.3  1.7 - 7.7 K/uL    Lymphocytes Relative 22  12 - 46 %    Lymphs Abs 1.8  0.7 - 4.0 K/uL    Monocytes Relative 8  3 - 12 %    Monocytes Absolute 0.7  0.1 - 1.0 K/uL    Eosinophils Relative 5  0 - 5 %    Eosinophils Absolute 0.4  0.0 - 0.7 K/uL    Basophils Relative 1  0 - 1 %    Basophils Absolute 0.1  0.0 - 0.1 K/uL   BASIC METABOLIC PANEL     Status: Abnormal   Collection Time   11/01/12  4:40 PM      Component Value Range Comment   Sodium 136  135 - 145 mEq/L    Potassium 5.4 (*) 3.5 - 5.1 mEq/L HEMOLYSIS AT THIS LEVEL MAY  AFFECT RESULT   Chloride 98  96 - 112 mEq/L    CO2 28  19 - 32 mEq/L    Glucose, Bld 73  70 - 99 mg/dL    BUN 22  6 - 23 mg/dL    Creatinine, Ser 7.84  0.50 - 1.35 mg/dL    Calcium 9.9  8.4 - 69.6 mg/dL    GFR calc non Af Amer 79 (*) >90 mL/min    GFR calc Af Amer >90  >90 mL/min   BASIC METABOLIC PANEL     Status: Abnormal   Collection Time   11/01/12  6:20 PM      Component Value Range Comment   Sodium 135  135 - 145 mEq/L    Potassium 4.4  3.5 - 5.1 mEq/L DELTA CHECK NOTED   Chloride 99  96 - 112 mEq/L    CO2 29  19 - 32 mEq/L    Glucose, Bld 77  70 - 99 mg/dL    BUN 22  6 - 23 mg/dL    Creatinine, Ser 2.95  0.50 - 1.35 mg/dL    Calcium 9.6  8.4 - 28.4 mg/dL    GFR calc non Af Amer 75 (*) >90 mL/min    GFR calc Af Amer 87 (*) >90 mL/min    No results found.  Review of Systems  Constitutional: Positive for fever and chills.  HENT: Negative.   Eyes: Negative.   Respiratory: Negative.   Cardiovascular: Negative.   Gastrointestinal: Negative.   Genitourinary: Negative.   Musculoskeletal: Negative.        Swelling of the right foot and ankle.  Skin: Negative.     Endo/Heme/Allergies: Negative.   Psychiatric/Behavioral: Negative.     Blood pressure 115/63, pulse 85, temperature 98.7 F (37.1 C), temperature source Oral, resp. rate 18, height 6\' 3"  (1.905 m), weight 121.11 kg (267 lb), SpO2 100.00%. Physical Exam  Constitutional: He is oriented to person, place, and time. He appears well-developed and well-nourished. No distress.  HENT:  Head: Normocephalic and atraumatic.  Right Ear: External ear normal.  Left Ear: External ear normal.  Nose: Nose normal.  Mouth/Throat: Oropharynx is clear and moist. No oropharyngeal exudate.  Eyes: Conjunctivae normal are normal. Pupils are equal, round, and reactive to light. Right eye exhibits no discharge. Left eye exhibits no discharge. No scleral icterus.  Neck: Normal range of motion. Neck supple.  Cardiovascular: Normal rate and regular rhythm.   Respiratory: Effort normal and breath sounds normal. No respiratory distress. He has no wheezes. He has no rales.  GI: Soft. Bowel sounds are normal. He exhibits no distension. There is no tenderness. There is no rebound.  Musculoskeletal:       Swelling of the right foot and ankle. Mildly warm and not tender. Pulses felt.  Neurological: He is alert and oriented to person, place, and time.       Moves all extremities.  Skin: He is not diaphoretic.  Psychiatric: His behavior is normal.     Assessment/Plan #1. Cellulitis of the right foot and ankle - patient's clinical scenario is compatible with cellulitis and thus patient has been started on vancomycin which will be continued, with Cipro. Since patient has been having swelling for 3 weeks we will get an MRI of the right foot and ankle. Recent Dopplers done 2 days ago were negative for DVT. Check uric acid levels. #2. Hypertension - since patient is getting hydrated will hold HCTZ. Continue lisinopril. #3. Diabetes mellitus type 2 -  continue Glucotrol but will hold metformin and will be placing patient on  sliding-scale coverage. Check hemoglobin A1c.  CODE STATUS - full code urine  Douglas Rooks N. 11/01/2012, 8:08 PM

## 2012-11-01 NOTE — ED Provider Notes (Signed)
History     CSN: [id number]  Arrival date & time 11/01/12  1514   First MD Initiated Contact with Patient 11/01/12 1627      Chief Complaint  Patient presents with  . Foot Pain  . Edema    (Consider location/radiation/quality/duration/timing/severity/associated sxs/prior treatment) HPI Comments: Patient was seen here 2 days ago and diagnosed with right foot cellulitis. He also had a DVT study which was negative. He was prescribed oral clindamycin. He returns to the ER today for worsening right foot pain, swelling, redness. He is also had subjective fever and chills at home. Denies any drainage, focal area of induration or fluctuance, new injury, or calf or knee pain.  Patient is a 46 y.o. male presenting with lower extremity pain. The history is provided by the patient. No language interpreter was used.  Foot Pain This is a recurrent problem. The current episode started in the past 7 days. The problem occurs constantly. The problem has been gradually worsening. Associated symptoms include chills, a fever (subj) and joint swelling (R ankle). Pertinent negatives include no abdominal pain, arthralgias, chest pain, coughing, diaphoresis, fatigue, nausea, neck pain, numbness, sore throat, vomiting or weakness. The symptoms are aggravated by walking. He has tried oral narcotics (on day 3 of clinda) for the symptoms. The treatment provided moderate relief.    Past Medical History  Diagnosis Date  . Diabetes mellitus   . Hypertension     No past surgical history on file.  History reviewed. No pertinent family history.  History  Substance Use Topics  . Smoking status: Never Smoker   . Smokeless tobacco: Not on file  . Alcohol Use: No      Review of Systems  Constitutional: Positive for fever (subj) and chills. Negative for diaphoresis, activity change, appetite change and fatigue.  HENT: Negative for sore throat and neck pain.   Eyes: Negative for discharge and visual disturbance.   Respiratory: Negative for cough, choking and shortness of breath.   Cardiovascular: Negative for chest pain and leg swelling.  Gastrointestinal: Negative for nausea, vomiting, abdominal pain, diarrhea and constipation.  Genitourinary: Negative for dysuria and difficulty urinating.  Musculoskeletal: Positive for joint swelling (R ankle) and gait problem (mild, still ambulatory. hurts more to walk on it). Negative for back pain and arthralgias.  Skin: Negative for color change, pallor and wound.  Neurological: Negative for dizziness, speech difficulty, weakness, light-headedness and numbness.  Psychiatric/Behavioral: Negative for behavioral problems and agitation.  All other systems reviewed and are negative.    Allergies  Review of patient's allergies indicates no known allergies.  Home Medications   Current Outpatient Rx  Name  Route  Sig  Dispense  Refill  . CLINDAMYCIN HCL 150 MG PO CAPS   Oral   Take 3 capsules (450 mg total) by mouth 3 (three) times daily.   90 capsule   0   . GLIPIZIDE 10 MG PO TABS   Oral   Take 10 mg by mouth 2 (two) times daily before a meal.         . HYDROCHLOROTHIAZIDE 25 MG PO TABS   Oral   Take 25 mg by mouth daily.         Marland Kitchen LISINOPRIL 10 MG PO TABS   Oral   Take 10 mg by mouth daily.         Marland Kitchen METFORMIN HCL 1000 MG PO TABS   Oral   Take 1,000 mg by mouth 2 (two) times daily with a meal.         .  OXYCODONE-ACETAMINOPHEN 5-325 MG PO TABS      1 to 2 tabs PO q6hrs  PRN for pain   15 tablet   0     BP 150/86  Pulse 105  Temp 97.7 F (36.5 C) (Oral)  Resp 18  SpO2 100%  Physical Exam  Constitutional: He appears well-developed. No distress.  HENT:  Head: Normocephalic and atraumatic.  Mouth/Throat: No oropharyngeal exudate.  Eyes: EOM are normal. Pupils are equal, round, and reactive to light. Right eye exhibits no discharge. Left eye exhibits no discharge.  Neck: Normal range of motion. Neck supple. No JVD present.    Cardiovascular: Regular rhythm and normal heart sounds.        Sinus tac  Pulmonary/Chest: Effort normal and breath sounds normal. No stridor. No respiratory distress. He has no wheezes. He has no rales. He exhibits no tenderness.  Abdominal: Soft. Bowel sounds are normal. There is no tenderness. There is no guarding.  Genitourinary: Penis normal.  Musculoskeletal: Normal range of motion. He exhibits tenderness. He exhibits no edema.       On inspection the entire dorsum of the right foot is erythematous, warm to touch, mildly tender to palpation. The edema extends from the dorsum of his foot to his entire ankle. Patient has full range of motion of his ankle with minimal pain. 2+ dorsalis pedis pulse. Full range of motion of all toes. No focal area of induration, fluctuance, drainage. No tenderness over the calf or knee, full range of motion of the knee. 1+ pitting edema over dorsum of foot and ankle. No open sores over the bottom of his foot.  Neurological: He is alert. No cranial nerve deficit. He exhibits normal muscle tone.  Skin: Skin is warm and dry. He is not diaphoretic. No erythema. No pallor.  Psychiatric: He has a normal mood and affect. His behavior is normal. Judgment and thought content normal.    ED Course  Procedures (including critical care time)  Labs Reviewed  CBC WITH DIFFERENTIAL - Abnormal; Notable for the following:    HCT 38.6 (*)     Platelets 577 (*)     All other components within normal limits  BASIC METABOLIC PANEL - Abnormal; Notable for the following:    Potassium 5.4 (*)  HEMOLYSIS AT THIS LEVEL MAY AFFECT RESULT   GFR calc non Af Amer 79 (*)     All other components within normal limits  BASIC METABOLIC PANEL - Abnormal; Notable for the following:    GFR calc non Af Amer 75 (*)     GFR calc Af Amer 87 (*)     All other components within normal limits  BASIC METABOLIC PANEL  CBC WITH DIFFERENTIAL  URIC ACID  HEMOGLOBIN A1C   No results found.   1.  Cellulitis of right foot   2. Diabetes mellitus   3. HTN (hypertension)       Date: 11/01/2012  Rate: 70  Rhythm: normal sinus rhythm  QRS Axis: normal  Intervals: normal  ST/T Wave abnormalities: normal  Conduction Disutrbances: none  Narrative Interpretation: nml  Old EKG Reviewed: No significant changes noted    MDM  4:57 PM consistent with cellulitis. Consider but doubt septic joint, necrotizing fasciitis, abscess. Has failed outpatient management at this point so will give IV clindamycin and vancomycin. Will get CBC, BMP, getting IV fluids. Nontoxic appearing, not septic.  Admitted in stable condition        Warrick Parisian, MD 11/02/12 [phone]

## 2012-11-01 NOTE — ED Notes (Signed)
Admitting MD at bedside, pt awaiting inpt beds assignment.  

## 2012-11-01 NOTE — ED Notes (Signed)
Pt resting quietly in chair, watching television. Pt denies any pain or complaints at this time. Pt has antibiotics infusing with no s/s of any infiltration noted. Pt has moderate to severe swelling of right foot and lower leg with pitting edema up to calf area of leg. Pt has palpable pedal pulse, pulse are is marked and pt tolerated procedure well. Plan of care is updated with verbal understanding and will continue to monitor pt.

## 2012-11-01 NOTE — ED Notes (Addendum)
Pt c/o swelling to right leg and right foot since 1 week ago, it has progressively worsened. Pt is diabetic, neuropathy at feet, rates intermittent shooting 7/10 pain to right foot. Friday, pt stubbed rt toe and oozing puss was noted by pt. Pt A&Ox4, ambulatory using crutches. Pt's foot is noted to be warm and red, bilateral dp pulses strong.

## 2012-11-02 ENCOUNTER — Inpatient Hospital Stay (HOSPITAL_COMMUNITY): Payer: Self-pay

## 2012-11-02 ENCOUNTER — Inpatient Hospital Stay (HOSPITAL_COMMUNITY): Payer: MEDICAID

## 2012-11-02 LAB — GLUCOSE, CAPILLARY
Glucose-Capillary: 92 mg/dL (ref 70–99)
Glucose-Capillary: 100 mg/dL — ABNORMAL HIGH (ref 70–99)

## 2012-11-02 LAB — BASIC METABOLIC PANEL
BUN: 18 mg/dL (ref 6–23)
CO2: 29 mEq/L (ref 19–32)
Calcium: 9.3 mg/dL (ref 8.4–10.5)
Chloride: 102 mEq/L (ref 96–112)
Creatinine, Ser: 1.14 mg/dL (ref 0.50–1.35)
GFR calc Af Amer: 88 mL/min — ABNORMAL LOW (ref >90)
GFR calc non Af Amer: 76 mL/min — ABNORMAL LOW (ref >90)
Glucose, Bld: 128 mg/dL — ABNORMAL HIGH (ref 70–99)
Potassium: 4.5 mEq/L (ref 3.5–5.1)
Sodium: 139 mEq/L (ref 135–145)

## 2012-11-02 LAB — CBC WITH DIFFERENTIAL/PLATELET
Basophils Relative: 1 % (ref 0–1)
Eosinophils Absolute: 0.4 10*3/uL (ref 0.0–0.7)
Lymphs Abs: 2.1 10*3/uL (ref 0.7–4.0)
MCH: 28 pg (ref 26.0–34.0)
MCHC: 32.8 g/dL (ref 30.0–36.0)
Neutrophils Relative %: 56 % (ref 43–77)
Platelets: 549 10*3/uL — ABNORMAL HIGH (ref 150–400)
RBC: 4.43 MIL/uL (ref 4.22–5.81)

## 2012-11-02 LAB — HEMOGLOBIN A1C
Hgb A1c MFr Bld: 6.6 % — ABNORMAL HIGH (ref <5.7)
Mean Plasma Glucose: 143 mg/dL — ABNORMAL HIGH (ref <117)

## 2012-11-02 LAB — URIC ACID: Uric Acid, Serum: 7 mg/dL (ref 4.0–7.8)

## 2012-11-02 MED ORDER — SODIUM CHLORIDE 0.9 % IV SOLN: INTRAVENOUS | Status: AC

## 2012-11-02 MED ORDER — GADOBENATE DIMEGLUMINE 529 MG/ML IV SOLN
20.0000 mL | Freq: Once | INTRAVENOUS | Status: AC
  Administered 2012-11-02: 20 mL via INTRAVENOUS

## 2012-11-02 NOTE — Progress Notes (Signed)
[facility]                                                                                Patient Demographics  Raymond Ryan, is a 46 y.o. male  ZOX:[id number]  [id number]  DOB - 09/25/1966  Admit date - 11/01/2012  Admitting Physician Eduard Clos, MD  Outpatient Primary MD for the patient is DEFAULT,PROVIDER, MD  LOS - 1   Chief Complaint  Patient presents with  . Foot Pain  . Edema        Assessment & Plan    1. Cellulitis of foot - clinically improved, await MRI of the foot, continue empiric antibiotics.   2. Diabetes mellitus -2 - stable continue present regimen of oral hypoglycemic agent along with sliding scale insulin  No results found for this basename: HGBA1C    CBG (last 3)   Basename 11/02/12 1100 11/02/12 0805 11/02/12 0335  GLUCAP 146* 116* 107*      3. History of hypertension  Blood pressure stable continue on present regimen Of ACE inhibitor   Code Status: Full  Family Communication: Discussed with the patient  Disposition Plan: home    Procedures MRI of the right leg and foot   Consults  None   Time Spent in minutes   45   Antibiotics   Anti-infectives     Start     Dose/Rate Route Frequency Ordered Stop   11/02/12 1200   vancomycin (VANCOCIN) IVPB 1000 mg/200 mL premix        1,000 mg 200 mL/hr over 60 Minutes Intravenous Every 12 hours 11/01/12 2159     11/02/12 1100   vancomycin (VANCOCIN) IVPB 1000 mg/200 mL premix  Status:  Discontinued        1,000 mg 200 mL/hr over 60 Minutes Intravenous Every 12 hours 11/01/12 2151 11/01/12 2159   11/01/12 2300   vancomycin (VANCOCIN) 2,500 mg in sodium chloride 0.9 % 500 mL IVPB  Status:  Discontinued        2,500 mg 250 mL/hr over 120 Minutes Intravenous  Once 11/01/12 2151 11/01/12 2154   11/01/12 2200   vancomycin (VANCOCIN) IVPB 1000 mg/200 mL premix        1,000 mg 200 mL/hr over 60 Minutes Intravenous NOW 11/01/12 2158 11/02/12 0002   11/01/12 1745   vancomycin (VANCOCIN) 1,500 mg in sodium chloride 0.9 % 500 mL IVPB        1,500 mg 250 mL/hr over 120 Minutes Intravenous To Emergency Dept 11/01/12 1656 11/01/12 1959   11/01/12 1645   clindamycin (CLEOCIN) IVPB 900 mg        900 mg 100 mL/hr over 30 Minutes Intravenous  Once 11/01/12 1639 11/01/12 1759   11/01/12 0000   ciprofloxacin (CIPRO) IVPB 400 mg        400 mg 200 mL/hr over 60 Minutes Intravenous Every 12 hours 11/01/12 2151            Scheduled Meds:   . sodium chloride   Intravenous STAT  . ciprofloxacin  400 mg Intravenous Q12H  . [COMPLETED] clindamycin (CLEOCIN) IV  900 mg Intravenous Once  . enoxaparin (LOVENOX) injection  40 mg  Subcutaneous Q24H  . [COMPLETED] gadobenate dimeglumine  20 mL Intravenous Once  . glipiZIDE  10 mg Oral BID AC  . [COMPLETED] influenza  inactive virus vaccine  0.5 mL Intramuscular Tomorrow-1000  . insulin aspart  0-9 Units Subcutaneous TID WC  . lisinopril  10 mg Oral Daily  . [COMPLETED] pneumococcal 23 valent vaccine  0.5 mL Intramuscular Tomorrow-1000  . [COMPLETED] sodium chloride  1,000 mL Intravenous Once  . [COMPLETED] TDaP  0.5 mL Intramuscular Once  . [COMPLETED] vancomycin  1,500 mg Intravenous To ER  . [COMPLETED] vancomycin  1,000 mg Intravenous NOW  . vancomycin  1,000 mg Intravenous Q12H  . [DISCONTINUED] vancomycin  2,500 mg Intravenous Once  . [DISCONTINUED] vancomycin  1,000 mg Intravenous Q12H   Continuous Infusions:   . sodium chloride 125 mL/hr at 11/01/12 2303   PRN Meds:.acetaminophen, acetaminophen, ondansetron (ZOFRAN) IV, ondansetron, oxyCODONE-acetaminophen, [DISCONTINUED] ondansetron (ZOFRAN) IV   DVT Prophylaxis  Lovenox  Lab Results  Component Value Date   PLT 549* 11/02/2012      Susa Raring K M.D on 11/02/2012 at 11:13 AM  Between 7am to 7pm - Pager - [phone]  After 7pm go to [email or web address] - password [email or web address]  And look for the night coverage person covering for me  after hours  [facility] Office  [phone]    Subjective:   Raymond Ryan today has, No headache, No chest pain, No abdominal pain - No Nausea, No new weakness tingling or numbness, No Cough - SOB.  Objective:   Filed Vitals:   11/01/12 1922 11/01/12 2057 11/01/12 2121 11/02/12 0605  BP: 115/63 117/82 123/69 130/81  Pulse: 85 87 79 74  Temp: 98.7 F (37.1 C) 98.5 F (36.9 C) 98.1 F (36.7 C) 97.7 F (36.5 C)  TempSrc: Oral Oral Oral   Resp: 18 18 18 18   Height:      Weight:      SpO2: 100% 100% 100% 100%    Wt Readings from Last 3 Encounters:  11/01/12 121.11 kg (267 lb)     Intake/Output Summary (Last 24 hours) at 11/02/12 1113 Last data filed at 11/02/12 0700  Gross per 24 hour  Intake 683.75 ml  Output    500 ml  Net 183.75 ml    Exam Awake Alert, Oriented X 3, No new F.N deficits, Normal affect [place].AT,PERRAL Supple Neck,No JVD, No cervical lymphadenopathy appriciated.  Symmetrical Chest wall movement, Good air movement bilaterally, CTAB RRR,No Gallops,Rubs or new Murmurs, No Parasternal Heave +ve B.Sounds, Abd Soft, Non tender, No organomegaly appriciated, No rebound - guarding or rigidity. No Cyanosis, Clubbing or edema, No new Rash or bruise , Rt foot mildly swollen , mild redness n warmth   Data Review   Micro Results No results found for this or any previous visit (from the past 240 hour(s)).  Radiology Reports No results found.  CBC  Lab 11/02/12 0730 11/01/12 1640 10/30/12 0749  WBC 7.3 8.2 9.9  HGB 12.4* 13.1 12.4*  HCT 37.8* 38.6* 37.2*  PLT 549* 577* 425*  MCV 85.3 85.0 84.4  MCH 28.0 28.9 28.1  MCHC 32.8 33.9 33.3  RDW 14.1 14.1 14.3  LYMPHSABS 2.1 1.8 2.0  MONOABS 0.6 0.7 1.2*  EOSABS 0.4 0.4 0.4  BASOSABS 0.1 0.1 0.1  BANDABS -- -- --    Chemistries   Lab 11/02/12 0730 11/01/12 1820 11/01/12 1640 10/30/12 0749  NA 139 135 136 138  K 4.5 4.4 5.4* 3.9  CL 102 99 98 99  CO2  29 29 28 27   GLUCOSE 128* 77 73  113*  BUN 18 22 22  32*  CREATININE 1.14 1.15 1.11 1.76*  CALCIUM 9.3 9.6 9.9 9.4  MG -- -- -- --  AST -- -- -- --  ALT -- -- -- --  ALKPHOS -- -- -- --  BILITOT -- -- -- --   ------------------------------------------------------------------------------------------------------------------ estimated creatinine clearance is 114.7 ml/min (by C-G formula based on Cr of 1.14). ------------------------------------------------------------------------------------------------------------------ No results found for this basename: HGBA1C:2 in the last 72 hours ------------------------------------------------------------------------------------------------------------------ No results found for this basename: CHOL:2,HDL:2,LDLCALC:2,TRIG:2,CHOLHDL:2,LDLDIRECT:2 in the last 72 hours ------------------------------------------------------------------------------------------------------------------ No results found for this basename: TSH,T4TOTAL,FREET3,T3FREE,THYROIDAB in the last 72 hours ------------------------------------------------------------------------------------------------------------------ No results found for this basename: VITAMINB12:2,FOLATE:2,FERRITIN:2,TIBC:2,IRON:2,RETICCTPCT:2 in the last 72 hours  Coagulation profile No results found for this basename: INR:5,PROTIME:5 in the last 168 hours  No results found for this basename: DDIMER:2 in the last 72 hours  Cardiac Enzymes No results found for this basename: CK:3,CKMB:3,TROPONINI:3,MYOGLOBIN:3 in the last 168 hours ------------------------------------------------------------------------------------------------------------------ No components found with this basename: POCBNP:3

## 2012-11-02 NOTE — Progress Notes (Signed)
Nutrition Brief Note  Patient identified on the Malnutrition Screening Tool (MST) Report  Body mass index is 33.37 kg/(m^2). Pt meets criteria for obese based on current BMI.   Current diet order is CHO Mod, patient is consuming approximately 100% of meals at this time per pt report. Labs and medications reviewed.   Pt reports ~10 lbs wt loss over an unspecified period of time (3% wt change).  States he lost this weight "because I'm working now."  RD clarified with pt that this wt loss was due to increased work-related activity vs. Poor appetite or intake.  Pt reports he checks his blood glucose several times per week- does not check it every day.  He reports it is usually 140-150 mg/dL  "like it has been here."  RD stated importance of nutrition and adequate glucose control for healing.  Pt denies need for education at this time.  No nutrition interventions warranted at this time. If nutrition issues arise, please consult RD.   Loyce Dys, MS RD LDN Clinical Inpatient Dietitian Pager: [phone] Weekend/After hours pager: [phone]

## 2012-11-03 LAB — GLUCOSE, CAPILLARY: Glucose-Capillary: 135 mg/dL — ABNORMAL HIGH (ref 70–99)

## 2012-11-03 NOTE — Progress Notes (Signed)
Orthopedic Tech Progress Note Patient Details:  Raymond Ryan 01-15-1966 [id number] CAM Walker applied to Right LE. Because of significant amount of swelling coupled with the large shoe size of patient, fitting modified. Toe strappings left loose as to not irritate toes or cut off circulation.   Ortho Devices Type of Ortho Device: CAM walker Ortho Device/Splint Location: Right Le Ortho Device/Splint Interventions: Application   Asia R Thompson 11/03/2012, 10:46 AM

## 2012-11-03 NOTE — Progress Notes (Signed)
[facility]                                                                                Patient Demographics  Raymond Ryan, is a 46 y.o. male  [id number]  [id number]  DOB - 1966/06/01  Admit date - 11/01/2012  Admitting Physician Eduard Clos, MD  Outpatient Primary MD for the patient is DEFAULT,PROVIDER, MD  LOS - 2   Chief Complaint  Patient presents with  . Foot Pain  . Edema        Assessment & Plan    1. Cellulitis of foot - clinically improved, x-ray and MRI of the right foot confirm chronic Charcot joint related fractures along with cellulitis but no ostia mellitus, continue empiric antibiotics. Discussed the case with Dr. Aldean Baker orthopedics who has requested CAM boot for the right foot which has been ordered, will give another few days of IV antibiotics thereafter we'll switch to by mouth pill and sent home with outpatient orthopedic followup.   2. Diabetes mellitus -2 - stable continue present regimen of oral hypoglycemic agent along with sliding scale insulin  Lab Results  Component Value Date   HGBA1C 6.6* 11/02/2012    CBG (last 3)   Basename 11/03/12 0643 11/02/12 2223 11/02/12 1601  GLUCAP 135* 100* 92      3. History of hypertension  Blood pressure stable continue on present regimen Of ACE inhibitor.    4. Mild acute renal failure upon admission  Completely resolved after IV fluids suggesting prerenal azotemia upon admission.   Code Status: Full  Family Communication: Discussed with the patient  Disposition Plan: home    Procedures MRI of the right leg and foot   Consults  None   Time Spent in minutes   45   Antibiotics   Anti-infectives     Start     Dose/Rate Route Frequency Ordered Stop   11/02/12 1200   vancomycin (VANCOCIN) IVPB 1000 mg/200 mL premix        1,000 mg 200 mL/hr over 60 Minutes Intravenous Every 12 hours 11/01/12 2159     11/02/12 1100   vancomycin (VANCOCIN) IVPB  1000 mg/200 mL premix  Status:  Discontinued        1,000 mg 200 mL/hr over 60 Minutes Intravenous Every 12 hours 11/01/12 2151 11/01/12 2159   11/01/12 2300   vancomycin (VANCOCIN) 2,500 mg in sodium chloride 0.9 % 500 mL IVPB  Status:  Discontinued        2,500 mg 250 mL/hr over 120 Minutes Intravenous  Once 11/01/12 2151 11/01/12 2154   11/01/12 2200   vancomycin (VANCOCIN) IVPB 1000 mg/200 mL premix        1,000 mg 200 mL/hr over 60 Minutes Intravenous NOW 11/01/12 2158 11/02/12 0002   11/01/12 1745   vancomycin (VANCOCIN) 1,500 mg in sodium chloride 0.9 % 500 mL IVPB        1,500 mg 250 mL/hr over 120 Minutes Intravenous To Emergency Dept 11/01/12 1656 11/01/12 1959   11/01/12 1645   clindamycin (CLEOCIN) IVPB 900 mg        900 mg 100 mL/hr over 30 Minutes Intravenous  Once  11/01/12 1639 11/01/12 1759   11/01/12 0000   ciprofloxacin (CIPRO) IVPB 400 mg        400 mg 200 mL/hr over 60 Minutes Intravenous Every 12 hours 11/01/12 2151            Scheduled Meds:    . [COMPLETED] sodium chloride   Intravenous STAT  . ciprofloxacin  400 mg Intravenous Q12H  . enoxaparin (LOVENOX) injection  40 mg Subcutaneous Q24H  . [COMPLETED] gadobenate dimeglumine  20 mL Intravenous Once  . glipiZIDE  10 mg Oral BID AC  . [COMPLETED] influenza  inactive virus vaccine  0.5 mL Intramuscular Tomorrow-1000  . insulin aspart  0-9 Units Subcutaneous TID WC  . lisinopril  10 mg Oral Daily  . [COMPLETED] pneumococcal 23 valent vaccine  0.5 mL Intramuscular Tomorrow-1000  . vancomycin  1,000 mg Intravenous Q12H   Continuous Infusions:    . sodium chloride    . [DISCONTINUED] sodium chloride 125 mL/hr at 11/01/12 2303   PRN Meds:.acetaminophen, acetaminophen, ondansetron (ZOFRAN) IV, ondansetron, oxyCODONE-acetaminophen   DVT Prophylaxis  Lovenox  Lab Results  Component Value Date   PLT 549* 11/02/2012      Susa Raring K M.D on 11/03/2012 at 8:46 AM  Between 7am to 7pm - Pager  - [phone]  After 7pm go to [email or web address] - password [email or web address]  And look for the night coverage person covering for me after hours  [facility] Office  [phone]    Subjective:   [name] today has, No headache, No chest pain, No abdominal pain - No Nausea, No new weakness tingling or numbness, No Cough - SOB.  Objective:   Filed Vitals:   11/02/12 0605 11/02/12 1407 11/02/12 2326 11/03/12 0617  BP: 130/81 136/75 140/78 145/86  Pulse: 74 88 86 82  Temp: 97.7 F (36.5 C) 97.9 F (36.6 C) 98.7 F (37.1 C) 98.9 F (37.2 C)  TempSrc:      Resp: 18 20 20 20   Height:      Weight:      SpO2: 100% 100% 99% 100%    Wt Readings from Last 3 Encounters:  11/01/12 121.11 kg (267 lb)     Intake/Output Summary (Last 24 hours) at 11/03/12 0846 Last data filed at 11/02/12 1700  Gross per 24 hour  Intake    480 ml  Output      0 ml  Net    480 ml    Exam Awake Alert, Oriented X 3, No new F.N deficits, Normal affect [place].AT,PERRAL Supple Neck,No JVD, No cervical lymphadenopathy appriciated.  Symmetrical Chest wall movement, Good air movement bilaterally, CTAB RRR,No Gallops,Rubs or new Murmurs, No Parasternal Heave +ve B.Sounds, Abd Soft, Non tender, No organomegaly appriciated, No rebound - guarding or rigidity. No Cyanosis, Clubbing or edema, No new Rash or bruise , Rt foot mildly swollen , mild redness n warmth   Data Review   Micro Results No results found for this or any previous visit (from the past 240 hour(s)).  Radiology Reports No results found.  CBC  Lab 11/02/12 0730 11/01/12 1640 10/30/12 0749  WBC 7.3 8.2 9.9  HGB 12.4* 13.1 12.4*  HCT 37.8* 38.6* 37.2*  PLT 549* 577* 425*  MCV 85.3 85.0 84.4  MCH 28.0 28.9 28.1  MCHC 32.8 33.9 33.3  RDW 14.1 14.1 14.3  LYMPHSABS 2.1 1.8 2.0  MONOABS 0.6 0.7 1.2*  EOSABS 0.4 0.4 0.4  BASOSABS 0.1 0.1 0.1  BANDABS -- -- --    Chemistries  Lab 11/02/12 0730 11/01/12 1820 11/01/12 1640  10/30/12 0749  NA 139 135 136 138  K 4.5 4.4 5.4* 3.9  CL 102 99 98 99  CO2 29 29 28 27   GLUCOSE 128* 77 73 113*  BUN 18 22 22  32*  CREATININE 1.14 1.15 1.11 1.76*  CALCIUM 9.3 9.6 9.9 9.4  MG -- -- -- --  AST -- -- -- --  ALT -- -- -- --  ALKPHOS -- -- -- --  BILITOT -- -- -- --   ------------------------------------------------------------------------------------------------------------------ estimated creatinine clearance is 114.7 ml/min (by C-G formula based on Cr of 1.14). ------------------------------------------------------------------------------------------------------------------  Basename 11/02/12 0730  HGBA1C 6.6*   ------------------------------------------------------------------------------------------------------------------ No results found for this basename: CHOL:2,HDL:2,LDLCALC:2,TRIG:2,CHOLHDL:2,LDLDIRECT:2 in the last 72 hours ------------------------------------------------------------------------------------------------------------------ No results found for this basename: TSH,T4TOTAL,FREET3,T3FREE,THYROIDAB in the last 72 hours ------------------------------------------------------------------------------------------------------------------ No results found for this basename: VITAMINB12:2,FOLATE:2,FERRITIN:2,TIBC:2,IRON:2,RETICCTPCT:2 in the last 72 hours  Coagulation profile No results found for this basename: INR:5,PROTIME:5 in the last 168 hours  No results found for this basename: DDIMER:2 in the last 72 hours  Cardiac Enzymes No results found for this basename: CK:3,CKMB:3,TROPONINI:3,MYOGLOBIN:3 in the last 168 hours ------------------------------------------------------------------------------------------------------------------ No components found with this basename: POCBNP:3

## 2012-11-03 NOTE — Progress Notes (Signed)
CARE MANAGEMENT NOTE 11/03/2012  Patient:  Raymond Ryan, Raymond Ryan   Account Number:  [id number]  Date Initiated:  11/03/2012  Documentation initiated by:  Vance Peper  Subjective/Objective Assessment:   46 yr old male admitted for right foot cellulitis.     Action/Plan:   No home health needs identified at this time. will follow.   Anticipated DC Date:  11/04/2012   Anticipated DC Plan:  HOME/SELF CARE      DC Planning Services  CM consult      Choice offered to / List presented to:             Status of service:  In process, will continue to follow Medicare Important Message given?   (If response is "NO", the following Medicare IM given date fields will be blank) Date Medicare IM given:   Date Additional Medicare IM given:    Discharge Disposition:    Per UR Regulation:    If discussed at Long Length of Stay Meetings, dates discussed:    Comments:

## 2012-11-04 LAB — GLUCOSE, CAPILLARY: Glucose-Capillary: 112 mg/dL — ABNORMAL HIGH (ref 70–99)

## 2012-11-04 MED ORDER — DOXYCYCLINE HYCLATE 100 MG PO TABS: 100.0000 mg | ORAL_TABLET | Freq: Two times a day (BID) | ORAL | Status: DC

## 2012-11-04 NOTE — Progress Notes (Signed)
CARE MANAGEMENT NOTE 11/04/2012  Patient:  Raymond Ryan, Raymond Ryan   Account Number:  [id number]  Date Initiated:  11/03/2012  Documentation initiated by:  Vance Peper  Subjective/Objective Assessment:   46 yr old male admitted for right foot cellulitis.     Action/Plan:   No home health needs identified at this time. will follow.has CAM boot.  Patient is active with [facility]. Finanical counselor to call patient regarding disability process.   Anticipated DC Date:  11/04/2012   Anticipated DC Plan:  HOME/SELF CARE      DC Planning Services  CM consult      Choice offered to / List presented to:             Status of service:  Completed, signed off Medicare Important Message given?   (If response is "NO", the following Medicare IM given date fields will be blank) Date Medicare IM given:   Date Additional Medicare IM given:    Discharge Disposition:    Per UR Regulation:    If discussed at Long Length of Stay Meetings, dates discussed:    Comments:

## 2012-11-04 NOTE — Discharge Summary (Signed)
[facility]                                                                                   Westly Hinnant, is a 46 y.o. male  DOB 1966-06-10  MRN [id number].  Admission date:  11/01/2012  Discharge Date:  11/04/2012  Primary MD  DEFAULT,PROVIDER, MD  Admitting Physician  Eduard Clos, MD  Admission Diagnosis  HTN (hypertension) [401.9] Cellulitis of right foot [682.7] Diabetes mellitus [250.00] Cellulitis  Discharge Diagnosis     Active Problems:  Cellulitis of foot  Diabetes mellitus  HTN (hypertension)   Past Medical History  Diagnosis Date  . Diabetes mellitus   . Hypertension     History reviewed. No pertinent past surgical history.      Discharge Diagnoses:   Active Problems:  Cellulitis of foot  Diabetes mellitus  HTN (hypertension)    Discharge Condition: Stable   Diet recommendation: See Discharge Instructions below   Consults Dr. Lajoyce Corners over the phone who reviewed his images and recommended CAM boot with outpatient followup with him.   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 Lajoyce Corners to the right foot requested by Dr. Lajoyce Corners  over the phone, at this point CAM boot along with outpatient followup with him in a week.  Case management has been requested to arrange for outpatient primary care physician .   He should have history of diabetes mellitus and hypertension both were stable and home medications for both will be continued. Have requested to closely follow with her primary care physician suggested by case management .   Lab Results  Component Value  Date   HGBA1C 6.6* 11/02/2012   CBG (last 3)   Basename 11/04/12 0658 11/03/12 2222 11/03/12 1606  GLUCAP 112* 98 99     Today   Subjective:   Raymond Ryan today has no headache,no chest abdominal pain,no new weakness tingling or numbness, feels much better wants to go home today.   Objective:   Blood pressure 150/80, pulse 90, temperature 99 F (37.2 C), temperature source Oral, resp. rate 22, height 6\' 3"  (1.905 m), weight 121.11 kg (267 lb), SpO2 99.00%.   Intake/Output Summary (Last 24 hours) at 11/04/12 0929 Last data filed at 11/03/12 2252  Gross per 24 hour  Intake   1160 ml  Output    350 ml  Net    810 ml    Exam Awake Alert, Oriented *3, No new F.N deficits, Normal affect [place].AT,PERRAL Supple Neck,No JVD, No cervical lymphadenopathy appriciated.  Symmetrical Chest wall movement, Good air movement bilaterally, CTAB RRR,No Gallops,Rubs or new Murmurs, No Parasternal Heave +ve B.Sounds, Abd Soft, Non tender, No organomegaly appriciated, No rebound -guarding or rigidity. No Cyanosis, Clubbing or edema, No new Rash or bruise, mild edema in the right foot, no warmth or  tenderness, good range of motion at the ankle.  Data Review    Mr Foot Right W Wo Contrast  11/02/2012  *RADIOLOGY REPORT*  Clinical Data: Diabetic patient with swollen foot. Osteomyelitis.  MRI OF THE RIGHT FOREFOOT WITHOUT AND WITH CONTRAST  Technique:  Multiplanar, multisequence MR imaging was performed both before and after administration of intravenous contrast.  Contrast: 20mL MULTIHANCE GADOBENATE DIMEGLUMINE 529 MG/ML IV SOLN  Comparison: None.  Findings: Constellation of findings present compatible with neuropathic midfoot.  There is dislocation and subluxation at the tarsometatarsal junction which spares the fifth metatarsal base. Dorsal subluxation of the first metatarsal base.  There are fractures of the second and third metatarsal bases. There is dorsal dislocation of the third metatarsal with a  small plantar fragment of bone.  Chronic the second metatarsal base fracture is present.  Lateral subluxation of the third metatarsal base associated with fracture and midfoot instability.  Dorsal and lateral subluxation of the third through fifth metatarsal bases relative to the midfoot bones. Lisfranc ligament remains attached to the second metatarsal base.  Diffuse edema is present in the forefoot both subcutaneously and in the musculature.  There are no focal fluid collections or ulcerations to suggest abscess.  There is a chronic transverse fracture of the second metatarsal base.   Intermetatarsal bursitis is present in the first and second metatarsals.  Midfoot joint effusions are present.  IMPRESSION: 1.  Negative for osteomyelitis.  Diffuse edema of the soft tissues likely represents combination of either dependent edema or cellulitis and denervation in this diabetic patient. 2.  Neuropathic midfoot changes with effusion. 3. Chronic fractures of the second third metatarsal bases with dorsal dislocation of the third metatarsal and lateral and dorsal subluxation of the fourth and fifth metatarsals.  Plain film radiographs would be useful to assess the alignment. Although there is a second metatarsal base fracture, the second metatarsal remains fairly well approximated to the middle cuneiform which is atypical for Lisfranc fracture dislocation.   Original Report Authenticated By: Andreas Newport, M.D.    Mr Ankle Right W Wo Contrast  11/02/2012  *RADIOLOGY REPORT*  Clinical Data: Osteomyelitis.  Foot and ankle swelling.  Diabetic foot.  MRI OF THE RIGHT ANKLE WITHOUT AND WITH CONTRAST  Technique:  Multiplanar, multisequence MR imaging was performed both before and after administration of intravenous contrast.  Contrast: 20mL MULTIHANCE GADOBENATE DIMEGLUMINE 529 MG/ML IV SOLN  Comparison: Ankle radiographs 09/07/2012.  MRI of the forefoot today.  Findings: Neuropathic changes of the midfoot are again noted.   See forefoot dictation for further description.  Mild subtalar osteoarthritis.  Achilles tendon and plantar fascia appear within normal limits.  Pes planus is present associated with midfoot instability.  Peroneal tendons and posteromedial tendons appear intact.  Diffuse subcutaneous edema is present circumferentially. Anterior tendon group appears normal.  Sinus tarsi appears within normal limits.  There is no ankle or subtalar effusion.  There is no evidence of osteomyelitis.  No soft tissue abscess.  IMPRESSION:  1.  Negative for osteomyelitis or abscess. 2.  Neuropathic midfoot. 3.  Intact ankle ligaments and tendons.  Mild subtalar osteoarthritis. 4.  Diffuse subcutaneous edema in the leg and ankle.  This may represent dependent edema or cellulitis in the appropriate clinical setting.   Original Report Authenticated By: Andreas Newport, M.D.    Dg Foot Complete Right  11/02/2012  *RADIOLOGY REPORT*  Clinical Data: 46 year old male with right foot swelling times 1 week and diabetes.  RIGHT FOOT COMPLETE - 3+ VIEW  Comparison: Right foot MRI from the same day.  Findings: Some of midfoot collapse and fragmentation at the tarsometatarsal junction. Calcaneus intact.  Phalanges intact.  No definite osteolysis. No subcutaneous gas.  There is soft tissue swelling primarily at the level of the metatarsals. No radiopaque foreign body identified.  IMPRESSION: Soft tissue swelling and neuropathic changes with no plain radiographic evidence of osteomyelitis.   Original Report Authenticated By: Erskine Speed, M.D.     Micro Results     CBC w Diff: Lab Results  Component Value Date   WBC 7.3 11/02/2012   HGB 12.4* 11/02/2012   HCT 37.8* 11/02/2012   PLT 549* 11/02/2012   LYMPHOPCT 28 11/02/2012   MONOPCT 8 11/02/2012   EOSPCT 6* 11/02/2012   BASOPCT 1 11/02/2012    CMP: Lab Results  Component Value Date   NA 139 11/02/2012   K 4.5 11/02/2012   CL 102 11/02/2012   CO2 29 11/02/2012   BUN 18 11/02/2012   CREATININE  1.14 11/02/2012  .   Discharge Instructions     Follow with Primary MD DEFAULT,PROVIDER, MD in 2 days   Get CBC, CMP, checked 2 days by Primary MD and again as instructed by your Primary MD.   Get Medicines reviewed and adjusted.  Please request your Prim.MD to go over all Hospital Tests and Procedure/Radiological results at the follow up, please get all Hospital records sent to your Prim MD by signing hospital release before you go home.  Activity: As tolerated with Full fall precautions use walker/cane & assistance as needed  Accuchecks 4 times/day, Once in AM empty stomach and then before each meal. Log in all results and show them to your Prim.MD in 3 days. If any glucose reading is under 80 or above 300 call your Prim MD immidiately. Follow Low glucose instructions for glucose under 80 as instructed.   Diet:  Heart healthy low carbohydrate  For Heart failure patients - Check your Weight same time everyday, if you gain over 2 pounds, or you develop in leg swelling, experience more shortness of breath or chest pain, call your Primary MD immediately. Follow Cardiac Low Salt Diet and 1.8 lit/day fluid restriction.  Disposition Home   If you experience worsening of your admission symptoms, develop shortness of breath, life threatening emergency, suicidal or homicidal thoughts you must seek medical attention immediately by calling 911 or calling your MD immediately  if symptoms less severe.  You Must read complete instructions/literature along with all the possible adverse reactions/side effects for all the Medicines you take and that have been prescribed to you. Take any new Medicines after you have completely understood and accpet all the possible adverse reactions/side effects.   Do not drive and provide baby sitting services if your were admitted for syncope or siezures until you have seen by Primary MD or a Neurologist and advised to do so again.  Do not drive when taking Pain  medications.    Do not take more than prescribed Pain, Sleep and Anxiety Medications  Special Instructions: If you have smoked or chewed Tobacco  in the last 2 yrs please stop smoking, stop any regular Alcohol  and or any Recreational drug use.  Wear Seat belts while driving.  Follow-up Information    Follow up with DEFAULT,PROVIDER, MD. Schedule an appointment as soon as possible for a visit in 3 days.   Contact information:   [address] [place] [address] [phone]  Follow up with DUDA,MARCUS V, MD. Schedule an appointment as soon as possible for a visit in 1 week.   Contact information:   [address] [place] [address] [place] [address] [phone]       Schedule an appointment as soon as possible for a visit in 2 days to follow up.           Discharge Medications     Medication List     As of 11/04/2012  9:29 AM    START taking these medications         doxycycline 100 MG tablet   Commonly known as: VIBRA-TABS   Take 1 tablet (100 mg total) by mouth 2 (two) times daily.      CONTINUE taking these medications         glipiZIDE 10 MG tablet   Commonly known as: GLUCOTROL      hydrochlorothiazide 25 MG tablet   Commonly known as: HYDRODIURIL      lisinopril 10 MG tablet   Commonly known as: PRINIVIL,ZESTRIL      metFORMIN 1000 MG tablet   Commonly known as: GLUCOPHAGE      oxyCODONE-acetaminophen 5-325 MG per tablet   Commonly known as: PERCOCET/ROXICET      STOP taking these medications         clindamycin 150 MG capsule   Commonly known as: CLEOCIN          Where to get your medications    These are the prescriptions that you need to pick up.   You may get these medications from any pharmacy.         doxycycline 100 MG tablet               Total Time in preparing paper work, data evaluation and todays exam - 35 minutes  Leroy Sea M.D on 11/04/2012 at 9:29 AM  [facility] Office  [phone]

## 2012-11-11 NOTE — ED Provider Notes (Signed)
I saw and evaluated the patient, reviewed the resident's note and I agree with the findings and plan.  Raeford Razor, MD 11/11/12 [phone]

## 2012-11-11 NOTE — ED Provider Notes (Signed)
I saw and evaluated the patient, reviewed the resident's note and I agree with the findings and plan.  46 year old male with right lower extremity pain and swelling. Recently seen for the same concern on antibiotics. Symptoms worsening despite this. Patient did have an ultrasound on his previous evaluation which is negative for clot. Given worsening of patient's symptoms will admit for IV antibiotics.  Raeford Razor, MD 11/11/12 [phone]

## 2013-06-25 ENCOUNTER — Encounter: Payer: Self-pay | Admitting: Family Medicine

## 2013-06-25 ENCOUNTER — Ambulatory Visit: Payer: No Typology Code available for payment source | Attending: Family Medicine | Admitting: Family Medicine

## 2013-06-25 VITALS — BP 157/93 | HR 104 | Temp 97.3°F | Resp 18 | Ht 76.0 in | Wt 280.4 lb

## 2013-06-25 DIAGNOSIS — E1122 Type 2 diabetes mellitus with diabetic chronic kidney disease: Secondary | ICD-10-CM | POA: Insufficient documentation

## 2013-06-25 DIAGNOSIS — I10 Essential (primary) hypertension: Secondary | ICD-10-CM

## 2013-06-25 DIAGNOSIS — A5211 Tabes dorsalis: Secondary | ICD-10-CM

## 2013-06-25 DIAGNOSIS — M14671 Charcot's joint, right ankle and foot: Secondary | ICD-10-CM

## 2013-06-25 DIAGNOSIS — E1165 Type 2 diabetes mellitus with hyperglycemia: Secondary | ICD-10-CM

## 2013-06-25 DIAGNOSIS — E1140 Type 2 diabetes mellitus with diabetic neuropathy, unspecified: Secondary | ICD-10-CM | POA: Insufficient documentation

## 2013-06-25 DIAGNOSIS — E1149 Type 2 diabetes mellitus with other diabetic neurological complication: Secondary | ICD-10-CM

## 2013-06-25 DIAGNOSIS — E1142 Type 2 diabetes mellitus with diabetic polyneuropathy: Secondary | ICD-10-CM

## 2013-06-25 DIAGNOSIS — E119 Type 2 diabetes mellitus without complications: Secondary | ICD-10-CM | POA: Insufficient documentation

## 2013-06-25 LAB — HEMOGLOBIN A1C: Hgb A1c MFr Bld: 7.9 % — ABNORMAL HIGH (ref <5.7)

## 2013-06-25 LAB — COMPREHENSIVE METABOLIC PANEL
ALT: 22 U/L (ref 0–53)
Albumin: 4.6 g/dL (ref 3.5–5.2)
CO2: 22 mEq/L (ref 19–32)
Calcium: 9.9 mg/dL (ref 8.4–10.5)
Chloride: 103 mEq/L (ref 96–112)
Creat: 1.4 mg/dL — ABNORMAL HIGH (ref 0.50–1.35)
Total Protein: 8.4 g/dL — ABNORMAL HIGH (ref 6.0–8.3)

## 2013-06-25 LAB — LIPID PANEL: LDL Cholesterol: 106 mg/dL — ABNORMAL HIGH (ref 0–99)

## 2013-06-25 MED ORDER — LISINOPRIL-HYDROCHLOROTHIAZIDE 20-12.5 MG PO TABS: 1.0000 | ORAL_TABLET | Freq: Every day | ORAL | Status: DC

## 2013-06-25 MED ORDER — GLIPIZIDE 10 MG PO TABS: 10.0000 mg | ORAL_TABLET | Freq: Two times a day (BID) | ORAL | Status: DC

## 2013-06-25 MED ORDER — GABAPENTIN 300 MG PO CAPS: 300.0000 mg | ORAL_CAPSULE | Freq: Every evening | ORAL | Status: DC | PRN

## 2013-06-25 MED ORDER — METFORMIN HCL ER 500 MG PO TB24: 1000.0000 mg | ORAL_TABLET | Freq: Two times a day (BID) | ORAL | Status: DC

## 2013-06-25 NOTE — Patient Instructions (Addendum)
A1c, Hemoglobin A1c The A1c (hemoglobin A1c, glycosylated hemoglobin) test checks the average amount of sugar (glucose) in the blood over the last 2 to 3 months. It does this by measuring the concentration of glycosylated hemoglobin. As glucose circulates in the blood, some of it binds to hemoglobin A. This is the main form of hemoglobin in adults. Hemoglobin is a red protein that carries oxygen in the red blood cells (RBCs). Once the glucose is bound to the hemoglobin A, it remains there for the life of the red blood cell (about 120 days). This combination of glucose and hemoglobin A is called A1c. Increased glucose in the blood, increases the hemoglobin A1c. A1c levels do not change quickly but will shift as RBCs are replaced. A1c is a valuable test because it enables you to know how your glucose has been controlled over the past 3 months.  5% A1c  Estimated Average Glucose mg/dL: 97  6% Z6X  Estimated Average Glucose mg/dL: 096  7% E4V  Estimated Average Glucose mg/dL: 409  8% W1X  Estimated Average Glucose mg/dL: 914  9% N8G  Estimated Average Glucose mg/dL: 956  21% H0Q  Estimated Average Glucose mg/dL: 657  84% O9G  Estimated Average Glucose mg/dL: 295  28% U1L  Estimated Average Glucose mg/dL: 244 The American Diabetes Association (ADA) recommends testing your A1c level 4 times each year if you have type 1 or type 2 diabetes and use insulin; or 2 times each year if you have type 2 diabetes and do not use insulin. When someone is first diagnosed with diabetes or if control is not good, A1c may be ordered more frequently. PREPARATION FOR TEST No preparation or fasting is necessary for this blood sample. NORMAL FINDINGS   Adults without diabetes: 2.2 to 4.8%  Children without diabetes: 1.8 to 4.0%  Good diabetic control: 2.5 to 5.9%  Fair diabetic control: 6 to 8%  Poor diabetic control: greater than 8% The values of A1c may be falsely low in pregnancy, in  disorders with shortened red blood cell lives, or in sickle cell disease or trait (carrier). The values may be falsely high in disorders with longer red cell lives, or in people with Thallassemia, kidney failure, or iron deficiency anemia. Ranges for normal findings may vary among different laboratories and hospitals. You should always check with your doctor after having lab work or other tests done to discuss the meaning of your test results and whether your values are considered within normal limits. MEANING OF TEST  Your caregiver will go over the test results with you and discuss the importance and meaning of your results, as well as treatment options and the need for additional tests if necessary. If your A1c is greater than 7%, discuss treatment options with your caregiver. OBTAINING THE TEST RESULTS  It is your responsibility to obtain your test results. Ask the lab or department performing the test when and how you will get your results. Document Released: 01/07/2005 Document Revised: 03/09/2012 Document Reviewed: 11/12/2012 [facility] Patient Information 2014 [place], [place]. Diabetes and Foot Care Diabetes may cause you to have a poor blood supply (circulation) to your legs and feet. Because of this, the skin may be thinner, break easier, and heal more slowly. You also may have nerve damage in your legs and feet causing decreased feeling. You may not notice minor injuries to your feet that could lead to serious problems or infections. Taking care of your feet is one of the most important things you  can do for yourself.  HOME CARE INSTRUCTIONS  Do not go barefoot. Bare feet are easily injured.  Check your feet daily for blisters, cuts, and redness.  Wash your feet with warm water (not hot) and mild soap. Pat your feet and between your toes until completely dry.  Apply a moisturizing lotion that does not contain alcohol or petroleum jelly to the dry skin on your feet and to dry brittle  toenails. Do not put it between your toes.  Trim your toenails straight across. Do not dig under them or around the cuticle.  Do not cut corns or calluses, or try to remove them with medicine.  Wear clean cotton socks or stockings every day. Make sure they are not too tight. Do not wear knee high stockings since they may decrease blood flow to your legs.  Wear leather shoes that fit properly and have enough cushioning. To break in new shoes, wear them just a few hours a day to avoid injuring your feet.  Wear shoes at all times, even in the house.  Do not cross your legs. This may decrease the blood flow to your feet.  If you find a minor scrape, cut, or break in the skin on your feet, keep it and the skin around it clean and dry. These areas may be cleansed with mild soap and water. Do not use peroxide, alcohol, iodine or Merthiolate.  When you remove an adhesive bandage, be sure not to harm the skin around it.  If you have a wound, look at it several times a day to make sure it is healing.  Do not use heating pads or hot water bottles. Burns can occur. If you have lost feeling in your feet or legs, you may not know it is happening until it is too late.  Report any cuts, sores or bruises to your caregiver. Do not wait! SEEK MEDICAL CARE IF:   You have an injury that is not healing or you notice redness, numbness, burning, or tingling.  Your feet always feel cold.  You have pain or cramps in your legs and feet. SEEK IMMEDIATE MEDICAL CARE IF:   There is increasing redness, swelling, or increasing pain in the wound.  There is a red line that goes up your leg.  Pus is coming from a wound.  You develop an unexplained oral temperature above 102 F (38.9 C), or as your caregiver suggests.  You notice a bad smell coming from an ulcer or wound. MAKE SURE YOU:   Understand these instructions.  Will watch your condition.  Will get help right away if you are not doing well or get  worse. Document Released: 12/13/2000 Document Revised: 03/09/2012 Document Reviewed: 06/21/2009 [name] Patient Information 2014 [place], [place]. Blood Sugar Monitoring, Adult GLUCOSE METERS FOR SELF-MONITORING OF BLOOD GLUCOSE  It is important to be able to correctly measure your blood sugar (glucose). You can use a blood glucose monitor (a small battery-operated device) to check your glucose level at any time. This allows you and your caregiver to monitor your diabetes and to determine how well your treatment plan is working. The process of monitoring your blood glucose with a glucose meter is called self-monitoring of blood glucose (SMBG). When people with diabetes control their blood sugar, they have better health. To test for glucose with a typical glucose meter, place the disposable strip in the meter. Then place a small sample of blood on the "test strip." The test strip is coated with chemicals  that combine with glucose in blood. The meter measures how much glucose is present. The meter displays the glucose level as a number. Several new models can record and store a number of test results. Some models can connect to personal computers to store test results or print them out.  Newer meters are often easier to use than older models. Some meters allow you to get blood from places other than your fingertip. Some new models have automatic timing, error codes, signals, or barcode readers to help with proper adjustment (calibration). Some meters have a large display screen or spoken instructions for people with visual impairments.  INSTRUCTIONS FOR USING GLUCOSE METERS  Wash your hands with soap and warm water, or clean the area with alcohol. Dry your hands completely.  Prick the side of your fingertip with a lancet (a sharp-pointed tool used by hand).  Hold the hand down and gently milk the finger until a small drop of blood appears. Catch the blood with the test strip.  Follow the instructions  for inserting the test strip and using the SMBG meter. Most meters require the meter to be turned on and the test strip to be inserted before applying the blood sample.  Record the test result.  Read the instructions carefully for both the meter and the test strips that go with it. Meter instructions are found in the user manual. Keep this manual to help you solve any problems that may arise. Many meters use "error codes" when there is a problem with the meter, the test strip, or the blood sample on the strip. You will need the manual to understand these error codes and fix the problem.  New devices are available such as laser lancets and meters that can test blood taken from "alternative sites" of the body, other than fingertips. However, you should use standard fingertip testing if your glucose changes rapidly. Also, use standard testing if:  You have eaten, exercised, or taken insulin in the past 2 hours.  You think your glucose is low.  You tend to not feel symptoms of low blood glucose (hypoglycemia).  You are ill or under stress.  Clean the meter as directed by the manufacturer.  Test the meter for accuracy as directed by the manufacturer.  Take your meter with you to your caregiver's office. This way, you can test your glucose in front of your caregiver to make sure you are using the meter correctly. Your caregiver can also take a sample of blood to test using a routine lab method. If values on the glucose meter are close to the lab results, you and your caregiver will see that your meter is working well and you are using good technique. Your caregiver will advise you about what to do if the results do not match. FREQUENCY OF TESTING  Your caregiver will tell you how often you should check your blood glucose. This will depend on your type of diabetes, your current level of diabetes control, and your types of medicines. The following are general guidelines, but your care plan may be  different. Record all your readings and the time of day you took them for review with your caregiver.   Diabetes type 1.  When you are using insulin with good diabetic control (either multiple daily injections or via a pump), you should check your glucose 4 times a day.  If your diabetes is not well controlled, you may need to monitor more frequently, including before meals and 2 hours after  meals, at bedtime, and occasionally between 2 a.m. and 3 a.m.  You should always check your glucose before a dose of insulin or before changing the rate on your insulin pump.  Diabetes type 2.  Guidelines for SMBG in diabetes type 2 are not as well defined.  If you are on insulin, follow the guidelines above.  If you are on medicines, but not insulin, and your glucose is not well controlled, you should test at least twice daily.  If you are not on insulin, and your diabetes is controlled with medicines or diet alone, you should test at least once daily, usually before breakfast.  A weekly profile will help your caregiver advise you on your care plan. The week before your visit, check your glucose before a meal and 2 hours after a meal at least daily. You may want to test before and after a different meal each day so you and your caregiver can tell how well controlled your blood sugars are throughout the course of a 24 hour period.  Gestational diabetes (diabetes during pregnancy).  Frequent testing is often necessary. Accurate timing is important.  If you are not on insulin, check your glucose 4 times a day. Check it before breakfast and 1 hour after the start of each meal.  If you are on insulin, check your glucose 6 times a day. Check it before each meal and 1 hour after the first bite of each meal.  General guidelines.  More frequent testing is required at the start of insulin treatment. Your caregiver will instruct you.  Test your glucose any time you suspect you have low blood sugar  (hypoglycemia).  You should test more often when you change medicines, when you have unusual stress or illness, or in other unusual circumstances. OTHER THINGS TO KNOW ABOUT GLUCOSE METERS  Measurement Range. Most glucose meters are able to read glucose levels over a broad range of values from as low as 0 to as high as 600 mg/dL. If you get an extremely high or low reading from your meter, you should first confirm it with another reading. Report very high or very low readings to your caregiver.  Whole Blood Glucose versus Plasma Glucose. Some older home glucose meters measure glucose in your whole blood. In a lab or when using some newer home glucose meters, the glucose is measured in your plasma (one component of blood). The difference can be important. It is important for you and your caregiver to know whether your meter gives its results as "whole blood equivalent" or "plasma equivalent."  Display of High and Low Glucose Values. Part of learning how to operate a meter is understanding what the meter results mean. Know how high and low glucose concentrations are displayed on your meter.  Factors that Affect Glucose Meter Performance. The accuracy of your test results depends on many factors and varies depending on the brand and type of meter. These factors include:  Low red blood cell count (anemia).  Substances in your blood (such as uric acid, vitamin C, and others).  Environmental factors (temperature, humidity, altitude).  Name-brand versus generic test strips.  Calibration. Make sure your meter is set up properly. It is a good idea to do a calibration test with a control solution recommended by the manufacturer of your meter whenever you begin using a fresh bottle of test strips. This will help verify the accuracy of your meter.  Improperly stored, expired, or defective test strips. Keep your strips in a  dry place with the lid on.  Soiled meter.  Inadequate blood sample. NEW  TECHNOLOGIES FOR GLUCOSE TESTING Alternative site testing Some glucose meters allow testing blood from alternative sites. These include the:  Upper arm.  Forearm.  Base of the thumb.  Thigh. Sampling blood from alternative sites may be desirable. However, it may have some limitations. Blood in the fingertips show changes in glucose levels more quickly than blood in other parts of the body. This means that alternative site test results may be different from fingertip test results, not because of the meter's ability to test accurately, but because the actual glucose concentration can be different.  Continuous Glucose Monitoring Devices to measure your blood glucose continuously are available, and others are in development. These methods can be more expensive than self-monitoring with a glucose meter. However, it is uncertain how effective and reliable these devices are. Your caregiver will advise you if this approach makes sense for you. IF BLOOD SUGARS ARE CONTROLLED, PEOPLE WITH DIABETES REMAIN HEALTHIER.  SMBG is an important part of the treatment plan of patients with diabetes mellitus. Below are reasons for using SMBG:   It confirms that your glucose is at a specific, healthy level.  It detects hypoglycemia and severe hyperglycemia.  It allows you and your caregiver to make adjustments in response to changes in lifestyle for individuals requiring medicine.  It determines the need for starting insulin therapy in temporary diabetes that happens during pregnancy (gestational diabetes). Document Released: 12/19/2003 Document Revised: 03/09/2012 Document Reviewed: 04/11/2011 [facility] Patient Information 2014 [place], [place].

## 2013-06-25 NOTE — Progress Notes (Signed)
Patient ID: Raymond Ryan, male   DOB: 20-Dec-1966, 47 y.o.   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   He has type 2 DM and reports no feeling in the feet.  He says that he does not take metformin regularly because it causes diarrhea.  He's been having so much difficulty with the GI complaints that he has decided to take more of the Glucotrol unless of the metformin because of symptoms and side effects.  He reports that he would like to see podiatrist to be treated for his nails.  He needs to have some clippings done.   No Known Allergies Past Medical History  Diagnosis Date  . Diabetes mellitus   . Hypertension    Current Outpatient Prescriptions on File Prior to Visit  Medication Sig Dispense Refill  . oxyCODONE-acetaminophen (PERCOCET/ROXICET) 5-325 MG per tablet Take 1-2 tablets by mouth every 6 (six) hours as needed. For pain       No current facility-administered medications on file prior to visit.   Family History  Problem Relation Age of Onset  . Diabetes Mellitus II Mother   . CAD Mother    History   Social History  . Marital Status: Single    Spouse Name: N/A    Number of Children: N/A  . Years of Education: N/A   Occupational History  . Not on file.   Social History Main Topics  . Smoking status: Never Smoker   . Smokeless tobacco: Not on file  . Alcohol Use: No  . Drug Use: No  . Sexually Active:    Other Topics Concern  . Not on file   Social History Narrative  . No narrative on file    Review of Systems  Constitutional: Negative for fever, chills, diaphoresis, activity change, appetite change and fatigue.  HENT: Negative for ear pain, nosebleeds, congestion, facial swelling, rhinorrhea, neck pain, neck stiffness and ear discharge.   Eyes: Negative for pain, discharge, redness, itching and visual disturbance.  Respiratory: Negative for cough, choking, chest tightness, shortness of  breath, wheezing and stridor.   Cardiovascular: Negative for chest pain, palpitations and leg swelling.  Gastrointestinal: Negative for abdominal distention.  Genitourinary: Negative for dysuria, urgency, frequency, hematuria, flank pain, decreased urine volume, difficulty urinating and dyspareunia.  Musculoskeletal: Negative for back pain, joint swelling, arthralgias and gait problem.  Neurological: Negative for dizziness, tremors, seizures, syncope, facial asymmetry, speech difficulty, weakness, light-headedness, numbness and headaches.  Hematological: Negative for adenopathy. Does not bruise/bleed easily.  Psychiatric/Behavioral: Negative for hallucinations, behavioral problems, confusion, dysphoric mood, decreased concentration and agitation.    Objective:   Filed Vitals:   06/25/13 1325  BP: 157/93  Pulse: 104  Temp: 97.3 F (36.3 C)  Resp: 18    Physical Exam  Constitutional: Appears well-developed and well-nourished. No distress.  HENT: Normocephalic. External right and left ear normal. Oropharynx is clear and moist.  Eyes: Conjunctivae and EOM are normal. PERRLA, no scleral icterus.  Neck: Normal ROM. Neck supple. No JVD. No tracheal deviation. No thyromegaly.  CVS: RRR, S1/S2 +, no murmurs, no gallops, no carotid bruit.  Pulmonary: Effort and breath sounds normal, no stridor, rhonchi, wheezes, rales.  Abdominal: Soft. BS +,  no distension, tenderness, rebound or guarding.  Musculoskeletal:right charcot foot  Lymphadenopathy: No lymphadenopathy noted, cervical, inguinal. Neuro: Alert. Normal reflexes, muscle tone coordination. No cranial nerve deficit. Skin: Skin is warm and dry. No rash noted.  Not diaphoretic. No erythema. No pallor.  Psychiatric: Normal mood and affect. Behavior, judgment, thought content normal.   Lab Results  Component Value Date   WBC 7.3 11/02/2012   HGB 12.4* 11/02/2012   HCT 37.8* 11/02/2012   MCV 85.3 11/02/2012   PLT 549* 11/02/2012   Lab  Results  Component Value Date   CREATININE 1.14 11/02/2012   BUN 18 11/02/2012   NA 139 11/02/2012   K 4.5 11/02/2012   CL 102 11/02/2012   CO2 29 11/02/2012    Lab Results  Component Value Date   HGBA1C 6.6* 11/02/2012   Lipid Panel  No results found for this basename: chol, trig, hdl, cholhdl, vldl, ldlcalc       Assessment and plan:   Patient Active Problem List   Diagnosis Date Noted  . Uncontrolled diabetes mellitus 06/25/2013  . Charcot's joint of right foot 06/25/2013  . Diabetic neuropathy 06/25/2013  . HTN (hypertension) 11/01/2012   Check labs Check A1c Referral to podiatry for further care and nail clippings  Increase BP meds to zestoretic 20/12.5 take 1 po daily  Follow up in 3 months  The patient was given clear instructions to go to ER or return to medical center if symptoms don't improve, worsen or new problems develop.  The patient verbalized understanding.  The patient was told to call to get any lab results if not heard anything in the next week.    Rodney Langton, MD, CDE, FAAFP [facility] [place], [place]

## 2013-06-25 NOTE — Progress Notes (Signed)
PT HERE FOR MEDICATION REFILL AND TO F/U ON DIABETES CARE. ALSO NEED A1C . STATES LAST CBG X 2 DYS AGO 260.DENIES PAIN

## 2013-06-28 ENCOUNTER — Telehealth: Payer: Self-pay | Admitting: *Deleted

## 2013-06-28 NOTE — Progress Notes (Signed)
Quick Note:  Please inform patient that his blood sugar and cholesterol is elevated and his hemoglobin A1c has gone way up to 7.9% from 6.6% 7 months ago. He needs to really work harder on getting his diabetes under better control. Watching his diet and taking his medications. He needs to take something for cholesterol to get his LDL cholesterol under 100. Recommend pravastatin 20 mg take 1 by mouth each bedtime, dispense #30, refill x3. Please call in for patient and document the medication in the med list. Labs should be rechecked again in 3 months.  Rodney Langton, MD, CDE, FAAFP [facility] [place], [place]   ______

## 2013-06-28 NOTE — Telephone Encounter (Signed)
06/28/13 Patient made aware of lab results . Informed patient to start making better food choices  And to work on his Diabetes . Will call in prescription for cholesterol per Dr. Laural Benes. Patient request  Pharmacy [facility] at [facility].Marland Kitchen P.Jakub Debold,RN BSN [facility]

## 2013-06-30 ENCOUNTER — Ambulatory Visit (HOSPITAL_COMMUNITY)
Admission: RE | Admit: 2013-06-30 | Discharge: 2013-06-30 | Disposition: A | Discharge status: Home / Self Care | Payer: No Typology Code available for payment source | Source: Ambulatory Visit | Attending: Internal Medicine | Admitting: Internal Medicine

## 2013-06-30 ENCOUNTER — Ambulatory Visit: Payer: No Typology Code available for payment source | Attending: Family Medicine | Admitting: Internal Medicine

## 2013-06-30 VITALS — BP 135/87 | HR 60 | Temp 97.7°F | Resp 17 | Ht 75.0 in | Wt 280.0 lb

## 2013-06-30 DIAGNOSIS — M259 Joint disorder, unspecified: Secondary | ICD-10-CM | POA: Insufficient documentation

## 2013-06-30 DIAGNOSIS — M25473 Effusion, unspecified ankle: Secondary | ICD-10-CM

## 2013-06-30 DIAGNOSIS — M7989 Other specified soft tissue disorders: Secondary | ICD-10-CM | POA: Insufficient documentation

## 2013-06-30 DIAGNOSIS — M25474 Effusion, right foot: Secondary | ICD-10-CM

## 2013-06-30 MED ORDER — DOXYCYCLINE HYCLATE 100 MG PO TABS: 100.0000 mg | ORAL_TABLET | Freq: Two times a day (BID) | ORAL | Status: DC

## 2013-06-30 NOTE — Progress Notes (Signed)
Patient ID: Raymond Ryan, male   DOB: 03/09/1966, 47 y.o.   MRN: [id number]   CC: Right great toe swelling  HPI: Patient is 47 year old male who presents to clinic with main concern of 2-3 days duration of right toe pain, swelling, draining pus, clear, no specific odor to it, tender to palpation, erythema and warmth to touch. Patient denies any known trauma to the area, no difficulty with ambulation, no fevers or chills, no similar events in the past.  No Known Allergies Past Medical History  Diagnosis Date  . Diabetes mellitus   . Hypertension    Current Outpatient Prescriptions on File Prior to Visit  Medication Sig Dispense Refill  . gabapentin (NEURONTIN) 300 MG capsule Take 1 capsule (300 mg total) by mouth at bedtime as needed.  30 capsule  3  . glipiZIDE (GLUCOTROL) 10 MG tablet Take 1 tablet (10 mg total) by mouth 2 (two) times daily before a meal.  60 tablet  4  . lisinopril-hydrochlorothiazide (ZESTORETIC) 20-12.5 MG per tablet Take 1 tablet by mouth daily.  30 tablet  3  . metFORMIN (GLUCOPHAGE XR) 500 MG 24 hr tablet Take 2 tablets (1,000 mg total) by mouth 2 (two) times daily with a meal.  120 tablet  4  . oxyCODONE-acetaminophen (PERCOCET/ROXICET) 5-325 MG per tablet Take 1-2 tablets by mouth every 6 (six) hours as needed. For pain       No current facility-administered medications on file prior to visit.   Family History  Problem Relation Age of Onset  . Diabetes Mellitus II Mother   . CAD Mother    History   Social History  . Marital Status: Single    Spouse Name: N/A    Number of Children: N/A  . Years of Education: N/A   Occupational History  . Not on file.   Social History Main Topics  . Smoking status: Never Smoker   . Smokeless tobacco: Not on file  . Alcohol Use: No  . Drug Use: No  . Sexually Active:    Other Topics Concern  . Not on file   Social History Narrative  . No narrative on file    Review of Systems  Constitutional: Negative  for fever, chills, diaphoresis, activity change, appetite change and fatigue.  HENT: Negative for ear pain, nosebleeds, congestion, facial swelling, rhinorrhea, neck pain, neck stiffness and ear discharge.   Eyes: Negative for pain, discharge, redness, itching and visual disturbance.  Respiratory: Negative for cough, choking, chest tightness, shortness of breath, wheezing and stridor.   Cardiovascular: Negative for chest pain, palpitations and leg swelling.  Gastrointestinal: Negative for abdominal distention.  Genitourinary: Negative for dysuria, urgency, frequency, hematuria, flank pain, decreased urine volume, difficulty urinating and dyspareunia.  Musculoskeletal: Negative for back pain, joint swelling, arthralgias and gait problem.  Neurological: Negative for dizziness, tremors, seizures, syncope, facial asymmetry, speech difficulty, weakness, light-headedness, numbness and headaches.  Hematological: Negative for adenopathy. Does not bruise/bleed easily.  Psychiatric/Behavioral: Negative for hallucinations, behavioral problems, confusion, dysphoric mood, decreased concentration and agitation.    Objective:   Filed Vitals:   06/30/13 1122  BP: 135/87  Pulse: 60  Temp: 97.7 F (36.5 C)  Resp: 17    Physical Exam  Constitutional: Appears well-developed and well-nourished. No distress.  HENT: Normocephalic. External right and left ear normal. Oropharynx is clear and moist.  Eyes: Conjunctivae and EOM are normal. PERRLA, no scleral icterus.  Neck: Normal ROM. Neck supple. No JVD. No tracheal deviation. No thyromegaly.  CVS: RRR, S1/S2 +, no murmurs, no gallops, no carotid bruit.  Pulmonary: Effort and breath sounds normal, no stridor, rhonchi, wheezes, rales.  Abdominal: Soft. BS +,  no distension, tenderness, rebound or guarding.  Musculoskeletal: Normal range of motion. No edema and no tenderness. Right great toe swollen and with erythema and tenderness to palpation  Lab Results   Component Value Date   WBC 7.3 11/02/2012   HGB 12.4* 11/02/2012   HCT 37.8* 11/02/2012   MCV 85.3 11/02/2012   PLT 549* 11/02/2012   Lab Results  Component Value Date   CREATININE 1.40* 06/25/2013   BUN 19 06/25/2013   NA 139 06/25/2013   K 4.6 06/25/2013   CL 103 06/25/2013   CO2 22 06/25/2013    Lab Results  Component Value Date   HGBA1C 7.9* 06/25/2013   Lipid Panel     Component Value Date/Time   CHOL 194 06/25/2013 1401   TRIG 285* 06/25/2013 1401   HDL 31* 06/25/2013 1401   CHOLHDL 6.3 06/25/2013 1401   VLDL 57* 06/25/2013 1401   LDLCALC 106* 06/25/2013 1401       Assessment and plan:   Patient Active Problem List   Diagnosis Date Noted  . Right great toe swelling - consistent with cellulitis, possible fracture, will send for xray of the right foot, prescribe course of Doxycycline for 7 days  06/25/2013

## 2013-06-30 NOTE — Progress Notes (Signed)
Patient states went yesterday to get fitted for orthotics When they put his foot into mold the great toe on the right foot began Bleeding

## 2013-06-30 NOTE — Patient Instructions (Signed)
Cellulitis Cellulitis is an infection of the skin and the tissue beneath it. The infected area is usually red and tender. Cellulitis occurs most often in the arms and lower legs.  CAUSES  Cellulitis is caused by bacteria that enter the skin through cracks or cuts in the skin. The most common types of bacteria that cause cellulitis are Staphylococcus and Streptococcus. SYMPTOMS   Redness and warmth.  Swelling.  Tenderness or pain.  Fever. DIAGNOSIS  Your caregiver can usually determine what is wrong based on a physical exam. Blood tests may also be done. TREATMENT  Treatment usually involves taking an antibiotic medicine. HOME CARE INSTRUCTIONS   Take your antibiotics as directed. Finish them even if you start to feel better.  Keep the infected arm or leg elevated to reduce swelling.  Apply a warm cloth to the affected area up to 4 times per day to relieve pain.  Only take over-the-counter or prescription medicines for pain, discomfort, or fever as directed by your caregiver.  Keep all follow-up appointments as directed by your caregiver. SEEK MEDICAL CARE IF:   You notice red streaks coming from the infected area.  Your red area gets larger or turns dark in color.  Your bone or joint underneath the infected area becomes painful after the skin has healed.  Your infection returns in the same area or another area.  You notice a swollen bump in the infected area.  You develop new symptoms. SEEK IMMEDIATE MEDICAL CARE IF:   You have a fever.  You feel very sleepy.  You develop vomiting or diarrhea.  You have a general ill feeling (malaise) with muscle aches and pains. MAKE SURE YOU:   Understand these instructions.  Will watch your condition.  Will get help right away if you are not doing well or get worse. Document Released: 09/25/2005 Document Revised: 06/16/2012 Document Reviewed: 03/02/2012 [employer] Patient Information 2014 [employer].  

## 2013-07-08 ENCOUNTER — Telehealth: Payer: Self-pay

## 2013-07-08 NOTE — Telephone Encounter (Signed)
Message copied by Lestine Mount on Thu Jul 08, 2013 12:41 PM ------      Message from: Cleora Fleet      Created: Mon Jun 28, 2013  9:06 AM       Please inform patient that his blood sugar and cholesterol is elevated and his hemoglobin A1c has gone way up to 7.9% from 6.6% 7 months ago.  He needs to really work harder on getting his diabetes under better control.  Watching his diet and taking his medications.  He needs to take something for cholesterol to get his LDL cholesterol under 100.  Recommend pravastatin 20 mg take 1 by mouth each bedtime, dispense #30, refill x3.  Please call in for patient and document the medication in the med list.  Labs should be rechecked again in 3 months.            Rodney Langton, MD, CDE, FAAFP      [facility]      [facility]      [place], [place]        ------

## 2013-07-08 NOTE — Telephone Encounter (Signed)
Left message to return our call.

## 2013-07-13 ENCOUNTER — Ambulatory Visit: Payer: No Typology Code available for payment source | Attending: Family Medicine | Admitting: Family Medicine

## 2013-07-13 VITALS — BP 127/82 | HR 99 | Temp 99.2°F | Resp 16 | Wt 284.0 lb

## 2013-07-13 DIAGNOSIS — E1140 Type 2 diabetes mellitus with diabetic neuropathy, unspecified: Secondary | ICD-10-CM

## 2013-07-13 DIAGNOSIS — A5211 Tabes dorsalis: Secondary | ICD-10-CM | POA: Insufficient documentation

## 2013-07-13 DIAGNOSIS — E1165 Type 2 diabetes mellitus with hyperglycemia: Secondary | ICD-10-CM

## 2013-07-13 DIAGNOSIS — I10 Essential (primary) hypertension: Secondary | ICD-10-CM

## 2013-07-13 DIAGNOSIS — N289 Disorder of kidney and ureter, unspecified: Secondary | ICD-10-CM | POA: Insufficient documentation

## 2013-07-13 DIAGNOSIS — IMO0001 Reserved for inherently not codable concepts without codable children: Secondary | ICD-10-CM | POA: Insufficient documentation

## 2013-07-13 DIAGNOSIS — M14671 Charcot's joint, right ankle and foot: Secondary | ICD-10-CM

## 2013-07-13 DIAGNOSIS — E1142 Type 2 diabetes mellitus with diabetic polyneuropathy: Secondary | ICD-10-CM

## 2013-07-13 DIAGNOSIS — E1149 Type 2 diabetes mellitus with other diabetic neurological complication: Secondary | ICD-10-CM

## 2013-07-13 LAB — LIPID PANEL
LDL Cholesterol: 57 mg/dL (ref 0–99)
VLDL: 47 mg/dL — ABNORMAL HIGH (ref 0–40)

## 2013-07-13 LAB — COMPLETE METABOLIC PANEL WITH GFR
ALT: 20 U/L (ref 0–53)
CO2: 30 mEq/L (ref 19–32)
Calcium: 10.3 mg/dL (ref 8.4–10.5)
Chloride: 99 mEq/L (ref 96–112)
GFR, Est African American: 73 mL/min
Potassium: 4.6 mEq/L (ref 3.5–5.3)
Sodium: 138 mEq/L (ref 135–145)
Total Protein: 7.9 g/dL (ref 6.0–8.3)

## 2013-07-13 NOTE — Progress Notes (Signed)
Patient ID: Raymond Ryan, male   DOB: [birth year], 47 y.o.   MRN: [id number]  CC: follow up right charcot foot   HPI: Pt says that he is here to follow up   No Known Allergies Past Medical History  Diagnosis Date  . Diabetes mellitus   . Hypertension    Current Outpatient Prescriptions on File Prior to Visit  Medication Sig Dispense Refill  . doxycycline (VIBRA-TABS) 100 MG tablet Take 1 tablet (100 mg total) by mouth 2 (two) times daily.  14 tablet  0  . gabapentin (NEURONTIN) 300 MG capsule Take 1 capsule (300 mg total) by mouth at bedtime as needed.  30 capsule  3  . glipiZIDE (GLUCOTROL) 10 MG tablet Take 1 tablet (10 mg total) by mouth 2 (two) times daily before a meal.  60 tablet  4  . lisinopril-hydrochlorothiazide (ZESTORETIC) 20-12.5 MG per tablet Take 1 tablet by mouth daily.  30 tablet  3  . metFORMIN (GLUCOPHAGE XR) 500 MG 24 hr tablet Take 2 tablets (1,000 mg total) by mouth 2 (two) times daily with a meal.  120 tablet  4  . oxyCODONE-acetaminophen (PERCOCET/ROXICET) 5-325 MG per tablet Take 1-2 tablets by mouth every 6 (six) hours as needed. For pain       No current facility-administered medications on file prior to visit.   Family History  Problem Relation Age of Onset  . Diabetes Mellitus II Mother   . CAD Mother    History   Social History  . Marital Status: Single    Spouse Name: N/A    Number of Children: N/A  . Years of Education: N/A   Occupational History  . Not on file.   Social History Main Topics  . Smoking status: Never Smoker   . Smokeless tobacco: Not on file  . Alcohol Use: No  . Drug Use: No  . Sexually Active:    Other Topics Concern  . Not on file   Social History Narrative  . No narrative on file    Review of Systems  Constitutional: Negative for fever, chills, diaphoresis, activity change, appetite change and fatigue.  HENT: Negative for ear pain, nosebleeds, congestion, facial swelling, rhinorrhea, neck pain, neck stiffness  and ear discharge.   Eyes: Negative for pain, discharge, redness, itching and visual disturbance.  Respiratory: Negative for cough, choking, chest tightness, shortness of breath, wheezing and stridor.   Cardiovascular: Negative for chest pain, palpitations and leg swelling.  Gastrointestinal: Negative for abdominal distention.  Genitourinary: Negative for dysuria, urgency, frequency, hematuria, flank pain, decreased urine volume, difficulty urinating and dyspareunia.  Musculoskeletal: Negative for back pain, joint swelling, arthralgias and gait problem.  Neurological: Negative for dizziness, tremors, seizures, syncope, facial asymmetry, speech difficulty, weakness, light-headedness, numbness and headaches.  Hematological: Negative for adenopathy. Does not bruise/bleed easily.  Psychiatric/Behavioral: Negative for hallucinations, behavioral problems, confusion, dysphoric mood, decreased concentration and agitation.    Objective:   Filed Vitals:   07/13/13 0959  BP: 127/82  Pulse: 99  Temp: 99.2 F (37.3 C)  Resp: 16    Physical Exam  Constitutional: Appears well-developed and well-nourished. No distress.  HENT: Normocephalic. External right and left ear normal. Oropharynx is clear and moist.  Eyes: Conjunctivae and EOM are normal. PERRLA, no scleral icterus.  Neck: Normal ROM. Neck supple. No JVD. No tracheal deviation. No thyromegaly.  CVS: RRR, S1/S2 +, no murmurs, no gallops, no carotid bruit.  Pulmonary: Effort and breath sounds normal, no stridor, rhonchi, wheezes, rales.  Abdominal: Soft. BS +,  no distension, tenderness, rebound or guarding.  Musculoskeletal: Normal range of motion. No edema and no tenderness.  Lymphadenopathy: No lymphadenopathy noted, cervical, inguinal. Neuro: Alert. Normal reflexes, muscle tone coordination. No cranial nerve deficit. Skin: Skin is warm and dry. No rash noted. Not diaphoretic. No erythema. No pallor.  Psychiatric: Normal mood and affect.  Behavior, judgment, thought content normal.   Lab Results  Component Value Date   WBC 7.3 11/02/2012   HGB 12.4* 11/02/2012   HCT 37.8* 11/02/2012   MCV 85.3 11/02/2012   PLT 549* 11/02/2012   Lab Results  Component Value Date   CREATININE 1.40* 06/25/2013   BUN 19 06/25/2013   NA 139 06/25/2013   K 4.6 06/25/2013   CL 103 06/25/2013   CO2 22 06/25/2013    Lab Results  Component Value Date   HGBA1C 7.9* 06/25/2013   Lipid Panel     Component Value Date/Time   CHOL 194 06/25/2013 1401   TRIG 285* 06/25/2013 1401   HDL 31* 06/25/2013 1401   CHOLHDL 6.3 06/25/2013 1401   VLDL 57* 06/25/2013 1401   LDLCALC 106* 06/25/2013 1401       Assessment and plan:   Patient Active Problem List   Diagnosis Date Noted  . Uncontrolled diabetes mellitus 06/25/2013  . Charcot's joint of right foot 06/25/2013  . Diabetic neuropathy 06/25/2013  . HTN (hypertension) 11/01/2012    Right Charcot Foot - will refer for second opinion and for podiatry from [facility] -also will get a second opinion orthopedic consult per patient request - Encouraged to wear a boot for protection of foot -the patient is at high-risk for amputation  Type 2 diabetes mellitus, uncontrolled - pt to continue to work to better control his DM and eat better  Renal Insufficiency - check labs today  Follow results.  RTC in 3 months  The patient was given clear instructions to go to ER or return to medical center if symptoms don't improve, worsen or new problems develop.  The patient verbalized understanding.  The patient was told to call to get any lab results if not heard anything in the next week.    Rodney Langton, MD, CDE, FAAFP [facility] [place], [place]

## 2013-07-13 NOTE — Progress Notes (Signed)
Pt here for wound recheck to right ft great toe/second digit s/p infection. Pt was sent for for xray 06/30/13 requesting results. Taking prescribed ATB's but concerned about clear drainage. Skin intact,swelling noted and nail bed off.

## 2013-07-13 NOTE — Patient Instructions (Addendum)
Blood Sugar Monitoring, Adult GLUCOSE METERS FOR SELF-MONITORING OF BLOOD GLUCOSE  It is important to be able to correctly measure your blood sugar (glucose). You can use a blood glucose monitor (a small battery-operated device) to check your glucose level at any time. This allows you and your caregiver to monitor your diabetes and to determine how well your treatment plan is working. The process of monitoring your blood glucose with a glucose meter is called self-monitoring of blood glucose (SMBG). When people with diabetes control their blood sugar, they have better health. To test for glucose with a typical glucose meter, place the disposable strip in the meter. Then place a small sample of blood on the "test strip." The test strip is coated with chemicals that combine with glucose in blood. The meter measures how much glucose is present. The meter displays the glucose level as a number. Several new models can record and store a number of test results. Some models can connect to personal computers to store test results or print them out.  Newer meters are often easier to use than older models. Some meters allow you to get blood from places other than your fingertip. Some new models have automatic timing, error codes, signals, or barcode readers to help with proper adjustment (calibration). Some meters have a large display screen or spoken instructions for people with visual impairments.  INSTRUCTIONS FOR USING GLUCOSE METERS  Wash your hands with soap and warm water, or clean the area with alcohol. Dry your hands completely.  Prick the side of your fingertip with a lancet (a sharp-pointed tool used by hand).  Hold the hand down and gently milk the finger until a small drop of blood appears. Catch the blood with the test strip.  Follow the instructions for inserting the test strip and using the SMBG meter. Most meters require the meter to be turned on and the test strip to be inserted before applying  the blood sample.  Record the test result.  Read the instructions carefully for both the meter and the test strips that go with it. Meter instructions are found in the user manual. Keep this manual to help you solve any problems that may arise. Many meters use "error codes" when there is a problem with the meter, the test strip, or the blood sample on the strip. You will need the manual to understand these error codes and fix the problem.  New devices are available such as laser lancets and meters that can test blood taken from "alternative sites" of the body, other than fingertips. However, you should use standard fingertip testing if your glucose changes rapidly. Also, use standard testing if:  You have eaten, exercised, or taken insulin in the past 2 hours.  You think your glucose is low.  You tend to not feel symptoms of low blood glucose (hypoglycemia).  You are ill or under stress.  Clean the meter as directed by the manufacturer.  Test the meter for accuracy as directed by the manufacturer.  Take your meter with you to your caregiver's office. This way, you can test your glucose in front of your caregiver to make sure you are using the meter correctly. Your caregiver can also take a sample of blood to test using a routine lab method. If values on the glucose meter are close to the lab results, you and your caregiver will see that your meter is working well and you are using good technique. Your caregiver will advise you about what   to do if the results do not match. FREQUENCY OF TESTING  Your caregiver will tell you how often you should check your blood glucose. This will depend on your type of diabetes, your current level of diabetes control, and your types of medicines. The following are general guidelines, but your care plan may be different. Record all your readings and the time of day you took them for review with your caregiver.   Diabetes type 1.  When you are using insulin  with good diabetic control (either multiple daily injections or via a pump), you should check your glucose 4 times a day.  If your diabetes is not well controlled, you may need to monitor more frequently, including before meals and 2 hours after meals, at bedtime, and occasionally between 2 a.m. and 3 a.m.  You should always check your glucose before a dose of insulin or before changing the rate on your insulin pump.  Diabetes type 2.  Guidelines for SMBG in diabetes type 2 are not as well defined.  If you are on insulin, follow the guidelines above.  If you are on medicines, but not insulin, and your glucose is not well controlled, you should test at least twice daily.  If you are not on insulin, and your diabetes is controlled with medicines or diet alone, you should test at least once daily, usually before breakfast.  A weekly profile will help your caregiver advise you on your care plan. The week before your visit, check your glucose before a meal and 2 hours after a meal at least daily. You may want to test before and after a different meal each day so you and your caregiver can tell how well controlled your blood sugars are throughout the course of a 24 hour period.  Gestational diabetes (diabetes during pregnancy).  Frequent testing is often necessary. Accurate timing is important.  If you are not on insulin, check your glucose 4 times a day. Check it before breakfast and 1 hour after the start of each meal.  If you are on insulin, check your glucose 6 times a day. Check it before each meal and 1 hour after the first bite of each meal.  General guidelines.  More frequent testing is required at the start of insulin treatment. Your caregiver will instruct you.  Test your glucose any time you suspect you have low blood sugar (hypoglycemia).  You should test more often when you change medicines, when you have unusual stress or illness, or in other unusual circumstances. OTHER  THINGS TO KNOW ABOUT GLUCOSE METERS  Measurement Range. Most glucose meters are able to read glucose levels over a broad range of values from as low as 0 to as high as 600 mg/dL. If you get an extremely high or low reading from your meter, you should first confirm it with another reading. Report very high or very low readings to your caregiver.  Whole Blood Glucose versus Plasma Glucose. Some older home glucose meters measure glucose in your whole blood. In a lab or when using some newer home glucose meters, the glucose is measured in your plasma (one component of blood). The difference can be important. It is important for you and your caregiver to know whether your meter gives its results as "whole blood equivalent" or "plasma equivalent."  Display of High and Low Glucose Values. Part of learning how to operate a meter is understanding what the meter results mean. Know how high and low glucose concentrations are displayed  on your meter.  Factors that Affect Glucose Meter Performance. The accuracy of your test results depends on many factors and varies depending on the brand and type of meter. These factors include:  Low red blood cell count (anemia).  Substances in your blood (such as uric acid, vitamin C, and others).  Environmental factors (temperature, humidity, altitude).  Name-brand versus generic test strips.  Calibration. Make sure your meter is set up properly. It is a good idea to do a calibration test with a control solution recommended by the manufacturer of your meter whenever you begin using a fresh bottle of test strips. This will help verify the accuracy of your meter.  Improperly stored, expired, or defective test strips. Keep your strips in a dry place with the lid on.  Soiled meter.  Inadequate blood sample. NEW TECHNOLOGIES FOR GLUCOSE TESTING Alternative site testing Some glucose meters allow testing blood from alternative sites. These include the:  Upper  arm.  Forearm.  Base of the thumb.  Thigh. Sampling blood from alternative sites may be desirable. However, it may have some limitations. Blood in the fingertips show changes in glucose levels more quickly than blood in other parts of the body. This means that alternative site test results may be different from fingertip test results, not because of the meter's ability to test accurately, but because the actual glucose concentration can be different.  Continuous Glucose Monitoring Devices to measure your blood glucose continuously are available, and others are in development. These methods can be more expensive than self-monitoring with a glucose meter. However, it is uncertain how effective and reliable these devices are. Your caregiver will advise you if this approach makes sense for you. IF BLOOD SUGARS ARE CONTROLLED, PEOPLE WITH DIABETES REMAIN HEALTHIER.  SMBG is an important part of the treatment plan of patients with diabetes mellitus. Below are reasons for using SMBG:   It confirms that your glucose is at a specific, healthy level.  It detects hypoglycemia and severe hyperglycemia.  It allows you and your caregiver to make adjustments in response to changes in lifestyle for individuals requiring medicine.  It determines the need for starting insulin therapy in temporary diabetes that happens during pregnancy (gestational diabetes). Document Released: 12/19/2003 Document Revised: 03/09/2012 Document Reviewed: 04/11/2011 [facility] Patient Information 2014 [place], [place]. Charcot-Marie-Tooth Disorder Charcot-Marie-Tooth (CMT) Disorder is a group of inherited diseases which affect the nerves to the arms and legs. The problems can range from very mild to severe weakness. The feet and legs tend to be affected first. High foot arches and curled toes are often the first signs of this disorder. Because the muscles are not getting the right signals from the brain, walking may become difficult.  There may be numbness as well. Fingers and hands may also be involved. Over time, the feet and hands may be deformed.  TREATMENT  There is no cure or specific treatment for CMT. Care may include custom-made shoes and leg braces to reduce discomfort and increase function. Physical therapy and moderate activity are often used to maintain muscle strength. For some patients, surgery may help correct deformities. Pain medicines may be needed. PROGNOSIS CMT is not a fatal disease and most forms of the disorder do not affect normal life expectancy. Most individuals with CMT are able to work. Wheelchair confinement is rare. Document Released: 12/06/2002 Document Revised: 03/09/2012 Document Reviewed: 12/13/2008 [facility] Patient Information 2014 [place], [place].

## 2013-07-14 ENCOUNTER — Telehealth: Payer: Self-pay

## 2013-07-14 NOTE — Telephone Encounter (Signed)
Message copied by Lestine Mount on Wed Jul 14, 2013  9:35 AM ------      Message from: Cleora Fleet      Created: Wed Jul 14, 2013  8:37 AM       Please inform patient that his kidney function came back that showed improvement.  Other labs came back okay except that his blood sugar is elevated.  He really needs to work harder on getting his blood sugars under better control.  Recommend rechecking in 3 months.                  Rodney Langton, MD, CDE, FAAFP      [facility]      [facility]      [place], [place]        ------

## 2013-07-14 NOTE — Telephone Encounter (Signed)
Spoke with patient Aware of his labs

## 2013-07-14 NOTE — Progress Notes (Signed)
Quick Note:  Please inform patient that his kidney function came back that showed improvement. Other labs came back okay except that his blood sugar is elevated. He really needs to work harder on getting his blood sugars under better control. Recommend rechecking in 3 months.   Rodney Langton, MD, CDE, FAAFP [facility] [place], [place]   ______

## 2013-08-17 ENCOUNTER — Ambulatory Visit (INDEPENDENT_AMBULATORY_CARE_PROVIDER_SITE_OTHER): Payer: No Typology Code available for payment source | Admitting: Family Medicine

## 2013-08-17 VITALS — BP 141/99 | Ht 75.0 in | Wt 280.0 lb

## 2013-08-17 DIAGNOSIS — E1149 Type 2 diabetes mellitus with other diabetic neurological complication: Secondary | ICD-10-CM

## 2013-08-17 DIAGNOSIS — E11610 Type 2 diabetes mellitus with diabetic neuropathic arthropathy: Secondary | ICD-10-CM

## 2013-08-17 NOTE — Patient Instructions (Signed)
Thank you for coming in today  You have Charcot foot Unfortunately, there is very little we can do for this.  We will refer you to [facility] to see foot and ankle surgeon I would recommend that you check your feet EVERY DAY Never walk barefoot Work really hard to get your sugar under control. Trying stationary bike or swimming

## 2013-08-17 NOTE — Progress Notes (Signed)
CC: Right foot deformity HPI: Patient is a very pleasant 68 are old male with past medical history of diabetes complicated by neuropathy who presents for evaluation of right foot deformity. Patient states that he was at work and had acute collapse of his foot. He was seen initially by podiatrist and placed in a boot. He was in a boot for 8-9 months and was only recently taken out of the boot. He has had foot infections in the past. He has neuropathy to the level of his ankle. He has minimal to no pain at this time as he is very little in sensation in the foot. He is most concerned because he has been told that this may eventually result in amputation. He thought he was supposed to be referred to [facility] but was scheduled for an appointment here.  ROS: As above in the HPI. All other systems are stable or negative.  PMH: Includes hypertension, poorly controlled diabetes with neuropathy, obesity, history of diabetic foot infection Social: Patient denies alcohol or drug use. Family: Positive for diabetes and coronary artery disease in patient's mother.  Allergies: No known drug allergies    OBJECTIVE: APPEARANCE:  Patient in no acute distress.The patient appeared well nourished and normally developed. HEENT: No scleral icterus. Conjunctiva non-injected Resp: Non labored Skin: No rash MSK:  Right foot: Patient has obvious Charcot deformity of the foot with collapse of the longitudinal arch and bony hypertrophy over the midfoot. There is no evidence of ulceration at this point. Patient has complete loss of sensation to light touch in the foot to the level of the ankle. There is no tenderness to palpation. Full range of motion at the ankle.    Radiographs: Previous radiographs from outside facility were personally reviewed. These show complete collapse of the midfoot with fragmentation of the tarsal metatarsal joints.  ASSESSMENT: #1. Charcot deformity of right foot in diabetic patient    PLAN: Discussed with the patient that unfortunately this is a very challenging problem to treat. Unfortunately we do not have much to offer him as far as shoe modification, orthotics, or rehabilitation for this problem. Recommended that he see a foot and ankle surgeon for consultation about the possibility of surgery to discuss his candidacy as well as risks and benefits. We will place a referral to [facility] for him to see a foot and ankle surgeon. In the meantime, recommended that patient check his feet every single day, avoid walking barefoot at any time, maximized his blood sugar control, as well as increase non-weightbearing exercise such as stationary bicycle.

## 2013-09-10 IMAGING — CR DG FOOT COMPLETE 3+V*R*
3 series · 3 of 3 positions shown · non-contrast
Comparison: Right foot MRI from the same day.

CLINICAL DATA: 46-year-old male with right foot swelling times 1
week and diabetes.

RIGHT FOOT COMPLETE - 3+ VIEW

[t foot ap right]
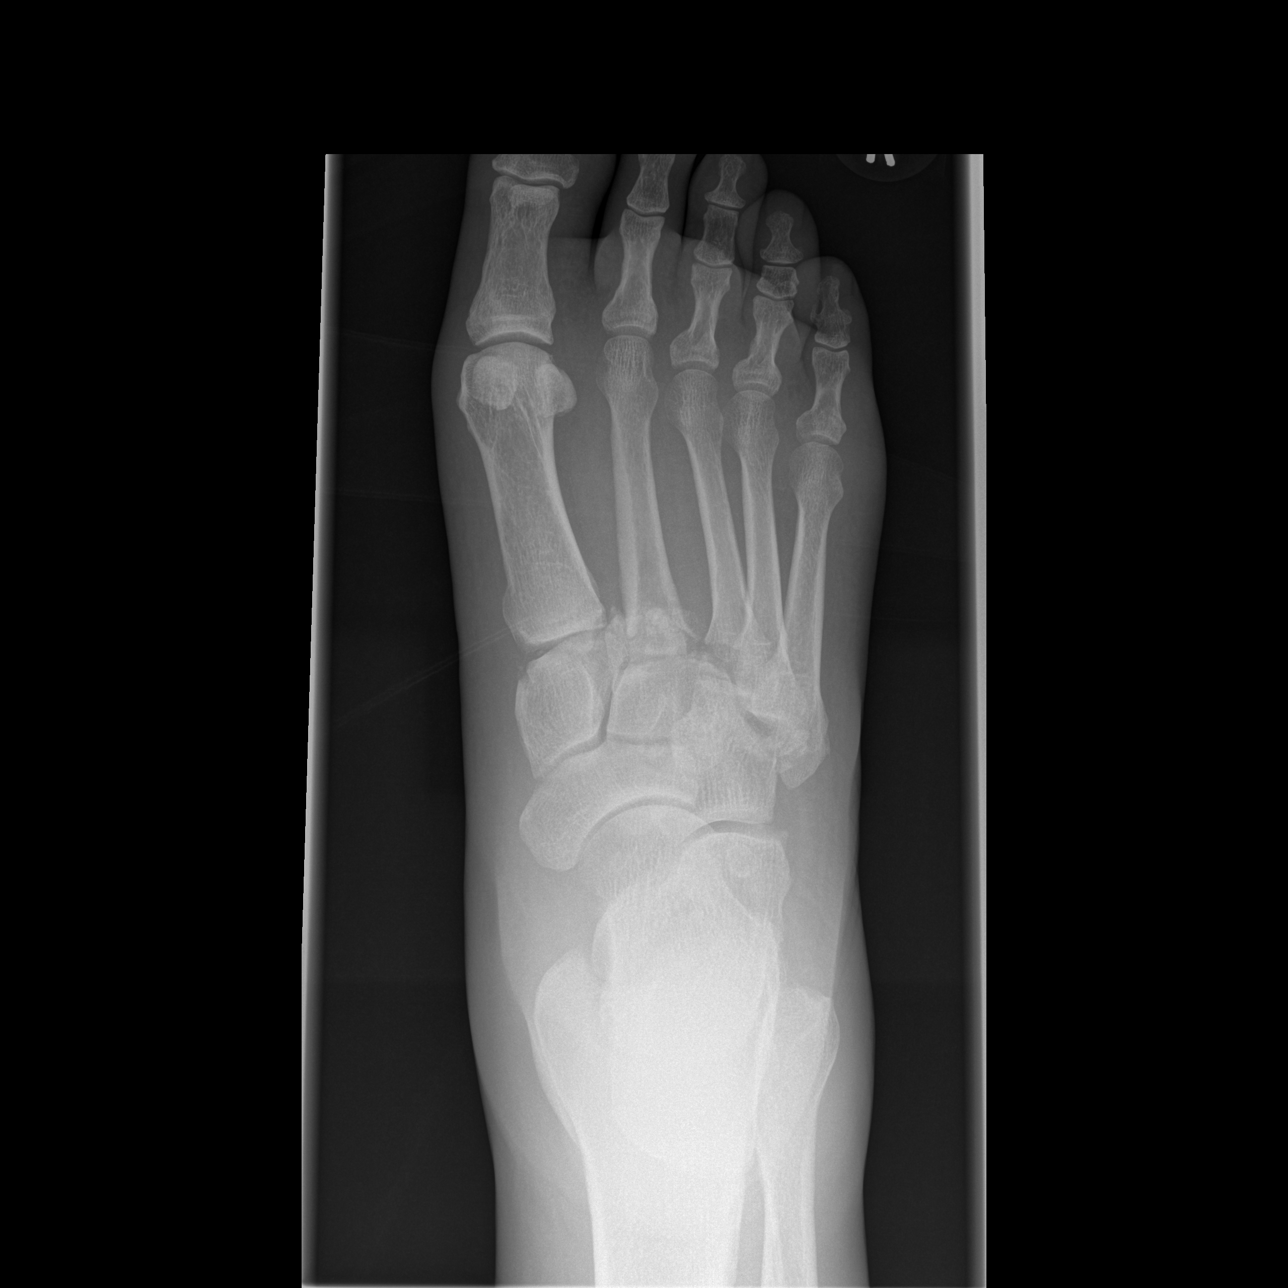

[t foot oblique right]
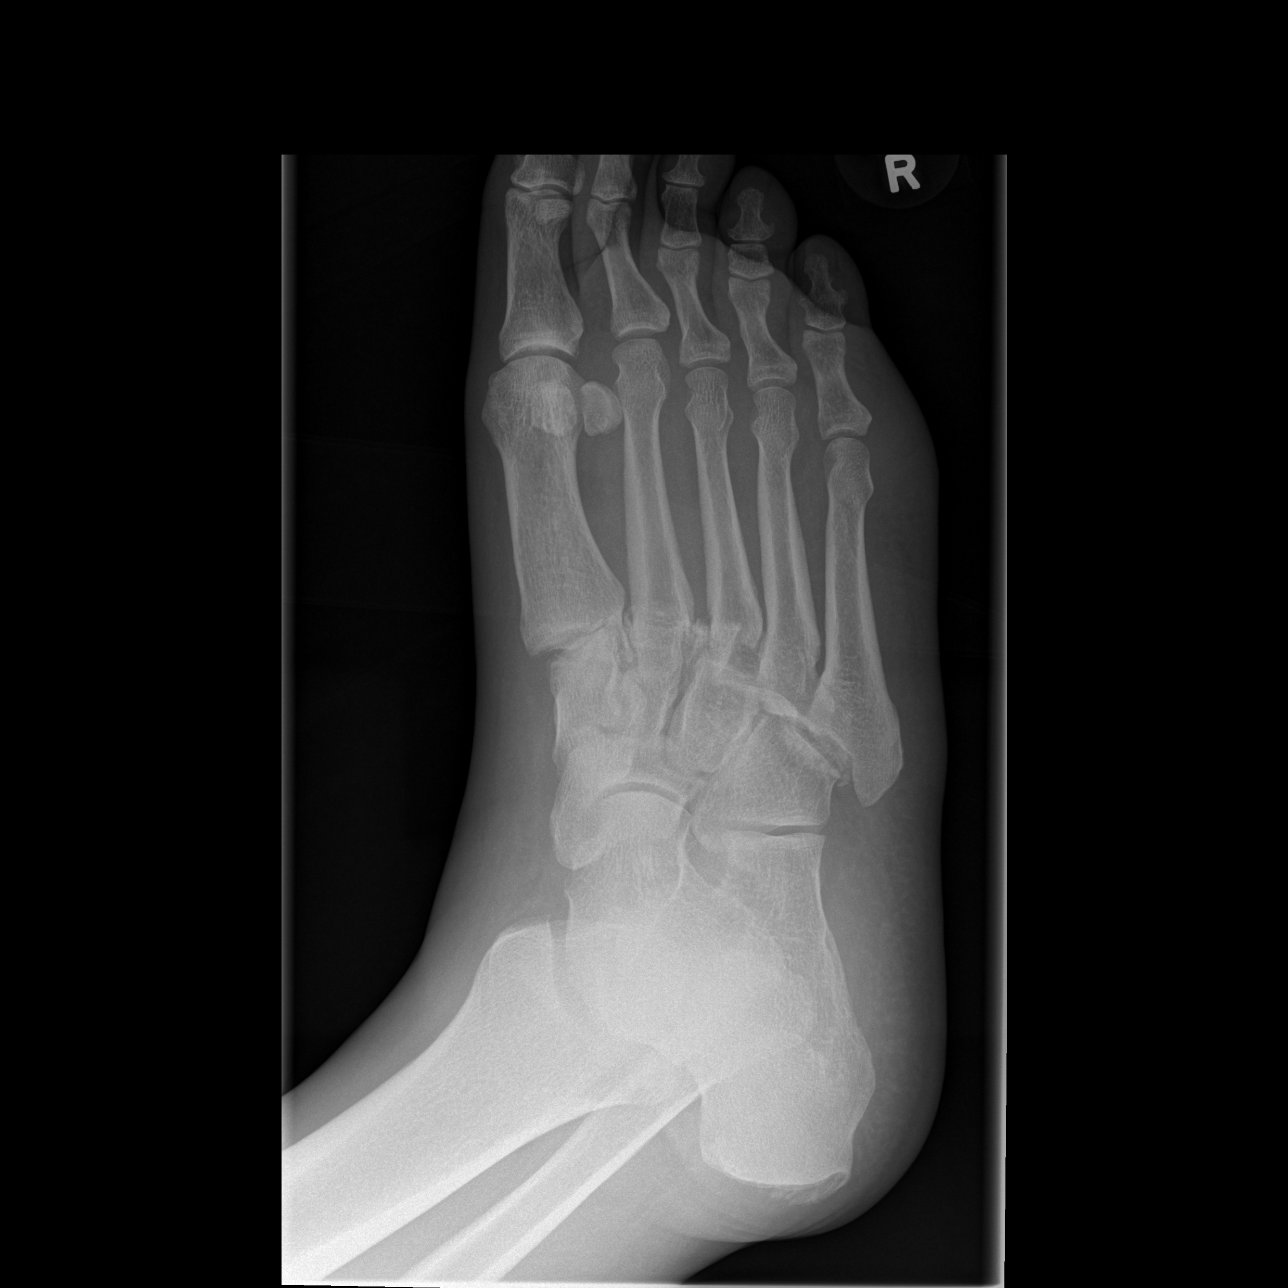

[t foot lat right]
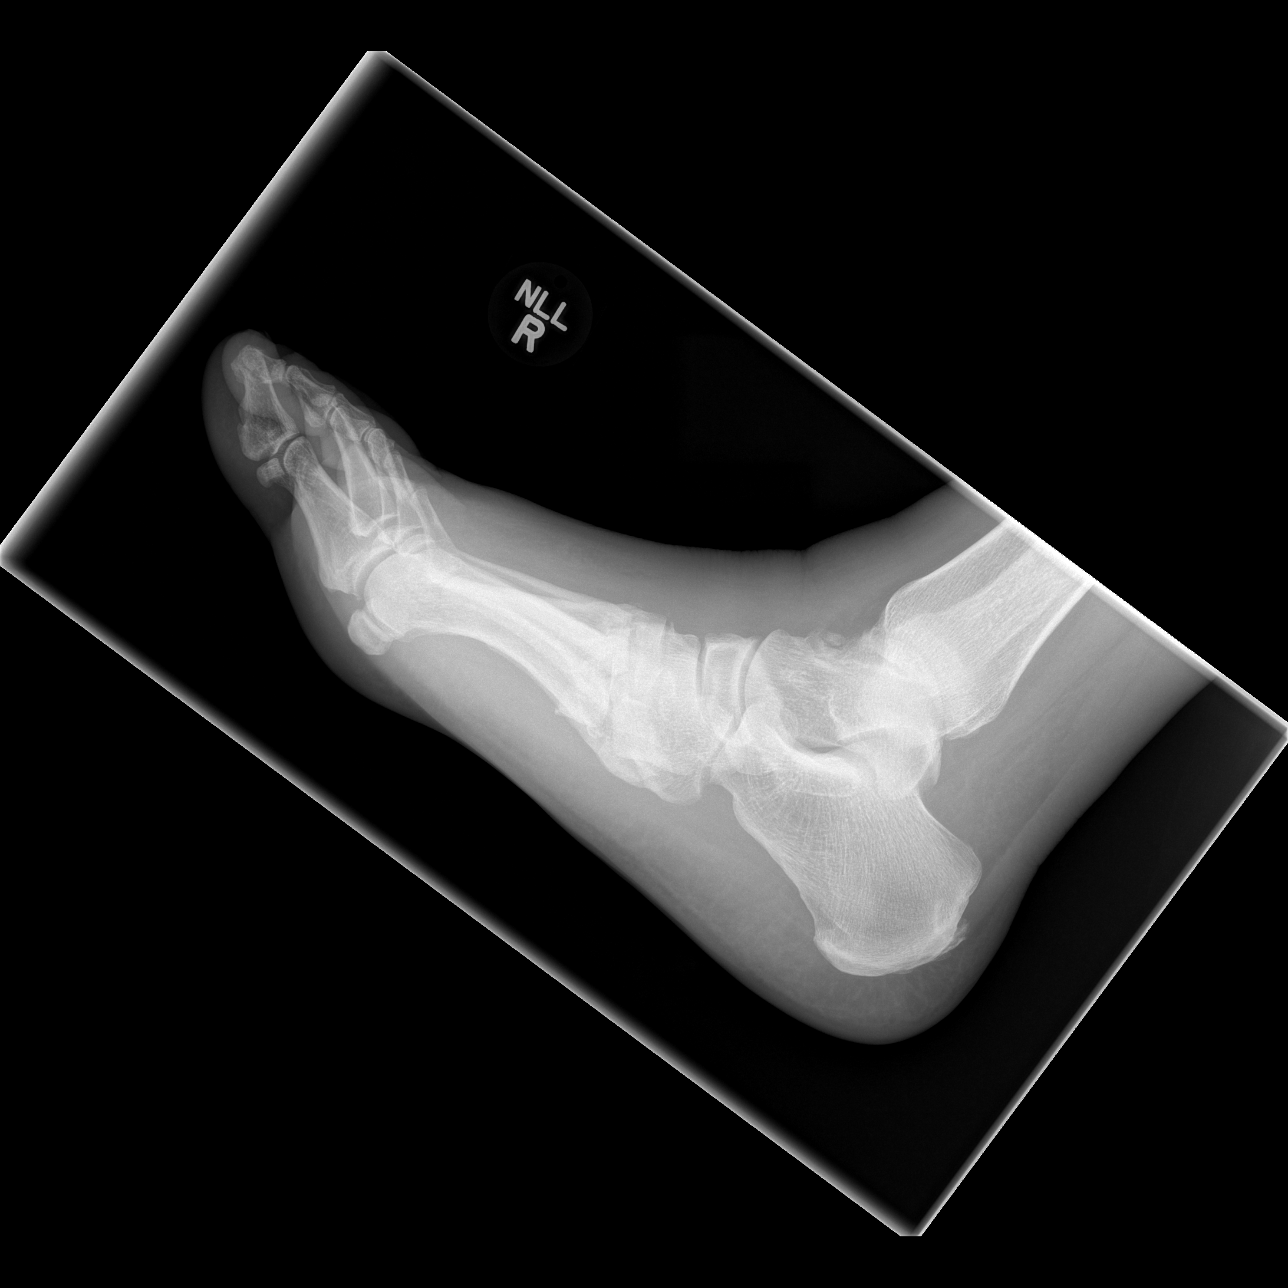

[3 of 3 positions shown; findings below may reference images not displayed]

FINDINGS: Some of midfoot collapse and fragmentation at the
tarsometatarsal junction. Calcaneus intact.  Phalanges intact.  No
definite osteolysis. No subcutaneous gas.  There is soft tissue
swelling primarily at the level of the metatarsals. No radiopaque
foreign body identified.
IMPRESSION: Soft tissue swelling and neuropathic changes with no plain
radiographic evidence of osteomyelitis.

## 2013-09-27 ENCOUNTER — Ambulatory Visit: Payer: No Typology Code available for payment source

## 2013-10-13 ENCOUNTER — Ambulatory Visit: Payer: No Typology Code available for payment source

## 2013-11-03 ENCOUNTER — Ambulatory Visit: Payer: No Typology Code available for payment source | Attending: Internal Medicine | Admitting: Internal Medicine

## 2013-11-03 VITALS — BP 128/83 | HR 70 | Temp 98.5°F | Resp 15

## 2013-11-03 DIAGNOSIS — E1142 Type 2 diabetes mellitus with diabetic polyneuropathy: Secondary | ICD-10-CM

## 2013-11-03 DIAGNOSIS — IMO0001 Reserved for inherently not codable concepts without codable children: Secondary | ICD-10-CM

## 2013-11-03 DIAGNOSIS — Z0000 Encounter for general adult medical examination without abnormal findings: Secondary | ICD-10-CM

## 2013-11-03 DIAGNOSIS — E1140 Type 2 diabetes mellitus with diabetic neuropathy, unspecified: Secondary | ICD-10-CM

## 2013-11-03 DIAGNOSIS — I10 Essential (primary) hypertension: Secondary | ICD-10-CM

## 2013-11-03 DIAGNOSIS — E1149 Type 2 diabetes mellitus with other diabetic neurological complication: Secondary | ICD-10-CM

## 2013-11-03 DIAGNOSIS — E1165 Type 2 diabetes mellitus with hyperglycemia: Secondary | ICD-10-CM

## 2013-11-03 DIAGNOSIS — E119 Type 2 diabetes mellitus without complications: Secondary | ICD-10-CM

## 2013-11-03 LAB — LIPID PANEL
Cholesterol: 156 mg/dL (ref 0–200)
HDL: 31 mg/dL — ABNORMAL LOW (ref >39)
Total CHOL/HDL Ratio: 5 Ratio

## 2013-11-03 MED ORDER — GLIPIZIDE 10 MG PO TABS: 10.0000 mg | ORAL_TABLET | Freq: Two times a day (BID) | ORAL | Status: DC

## 2013-11-03 MED ORDER — LISINOPRIL-HYDROCHLOROTHIAZIDE 20-12.5 MG PO TABS: 1.0000 | ORAL_TABLET | Freq: Every day | ORAL | Status: DC

## 2013-11-03 MED ORDER — METFORMIN HCL ER 500 MG PO TB24: 1000.0000 mg | ORAL_TABLET | Freq: Two times a day (BID) | ORAL | Status: DC

## 2013-11-03 MED ORDER — GABAPENTIN 300 MG PO CAPS: 300.0000 mg | ORAL_CAPSULE | Freq: Every evening | ORAL | Status: DC | PRN

## 2013-11-03 NOTE — Progress Notes (Signed)
Patient ID: Raymond Ryan, male   DOB: Oct 28, 1966, 47 y.o.   MRN: [id number]  CC: follow up  HPI: 47 year old male with past medical history of diabetes and diabetic neuropathy, hypertension, Charcot's foot who presented to clinic for followup. Patient reports numbness and tingling especially in the right foot. He reports pain on ambulation. No blurry vision or headaches.  No Known Allergies Past Medical History  Diagnosis Date  . Diabetes mellitus   . Hypertension    Current Outpatient Prescriptions on File Prior to Visit  Medication Sig Dispense Refill  . doxycycline (VIBRA-TABS) 100 MG tablet Take 1 tablet (100 mg total) by mouth 2 (two) times daily.  14 tablet  0  . oxyCODONE-acetaminophen (PERCOCET/ROXICET) 5-325 MG per tablet Take 1-2 tablets by mouth every 6 (six) hours as needed. For pain       No current facility-administered medications on file prior to visit.   Family History  Problem Relation Age of Onset  . Diabetes Mellitus II Mother   . CAD Mother    History   Social History  . Marital Status: Single    Spouse Name: N/A    Number of Children: N/A  . Years of Education: N/A   Occupational History  . Not on file.   Social History Main Topics  . Smoking status: Never Smoker   . Smokeless tobacco: Not on file  . Alcohol Use: No  . Drug Use: No  . Sexual Activity:    Other Topics Concern  . Not on file   Social History Narrative  . No narrative on file    Review of Systems  Constitutional: Negative for fever, chills, diaphoresis, activity change, appetite change and fatigue.  HENT: Negative for ear pain, nosebleeds, congestion, facial swelling, rhinorrhea, neck pain, neck stiffness and ear discharge.   Eyes: Negative for pain, discharge, redness, itching and visual disturbance.  Respiratory: Negative for cough, choking, chest tightness, shortness of breath, wheezing and stridor.   Cardiovascular: Negative for chest pain, palpitations and leg  swelling.  Gastrointestinal: Negative for abdominal distention.  Genitourinary: Negative for dysuria, urgency, frequency, hematuria, flank pain, decreased urine volume, difficulty urinating and dyspareunia.  Musculoskeletal: Negative for back pain, joint swelling, arthralgias and gait problem.  Neurological: Negative for dizziness, tremors, seizures, syncope, facial asymmetry, speech difficulty, weakness, light-headedness Hematological: Negative for adenopathy. Does not bruise/bleed easily.  Psychiatric/Behavioral: Negative for hallucinations, behavioral problems, confusion, dysphoric mood, decreased concentration and agitation.    Objective:   Filed Vitals:   11/03/13 1011  BP: 128/83  Pulse: 70  Temp: 98.5 F (36.9 C)  Resp: 15    Physical Exam  Constitutional: Appears well-developed and well-nourished. No distress.  HENT: Normocephalic. External right and left ear normal. Oropharynx is clear and moist.  Eyes: Conjunctivae and EOM are normal. PERRLA, no scleral icterus.  Neck: Normal ROM. Neck supple. No JVD. No tracheal deviation. No thyromegaly.  CVS: RRR, S1/S2 +, no murmurs, no gallops, no carotid bruit.  Pulmonary: Effort and breath sounds normal, no stridor, rhonchi, wheezes, rales.  Abdominal: Soft. BS +,  no distension, tenderness, rebound or guarding.  Musculoskeletal: Normal range of motion. No edema and no tenderness.  Lymphadenopathy: No lymphadenopathy noted, cervical, inguinal. Neuro: Alert. Normal reflexes, muscle tone coordination. No cranial nerve deficit. Skin: Skin is warm and dry. No rash noted. Not diaphoretic. No erythema. No pallor.  Psychiatric: Normal mood and affect. Behavior, judgment, thought content normal.   Lab Results  Component Value Date   WBC  7.3 11/02/2012   HGB 12.4* 11/02/2012   HCT 37.8* 11/02/2012   MCV 85.3 11/02/2012   PLT 549* 11/02/2012   Lab Results  Component Value Date   CREATININE 1.35 07/13/2013   BUN 16 07/13/2013   NA 138  07/13/2013   K 4.6 07/13/2013   CL 99 07/13/2013   CO2 30 07/13/2013    Lab Results  Component Value Date   HGBA1C 7.9* 06/25/2013   Lipid Panel     Component Value Date/Time   CHOL 135 07/13/2013 1105   TRIG 236* 07/13/2013 1105   HDL 31* 07/13/2013 1105   CHOLHDL 4.4 07/13/2013 1105   VLDL 47* 07/13/2013 1105   LDLCALC 57 07/13/2013 1105       Assessment and plan:   Patient Active Problem List   Diagnosis Date Noted  . Uncontrolled diabetes mellitus 06/25/2013    Priority: Medium - Check A1c today  - Continue glipizide and metformin   . Diabetic neuropathy 06/25/2013    Priority: Medium - Continue gabapentin   . HTN (hypertension) 11/01/2012    Priority: Medium - We have discussed target BP range - I have advised pt to check BP regularly and to call us back if the numbers are higher than 140/90 - discussed the importance of compliance with medical therapy and diet  - Continue Zestoretic   . Charcot's joint of right foot - follow up with ortho per scheduled appt 06/25/2013

## 2013-11-03 NOTE — Patient Instructions (Signed)

## 2013-11-03 NOTE — Progress Notes (Signed)
Patient here for follow up on his sharko foot(right) States was referred to [facility] but required 170.00 co pay that he can  Not afford

## 2014-06-30 ENCOUNTER — Telehealth: Payer: Self-pay | Admitting: Internal Medicine

## 2014-06-30 NOTE — Telephone Encounter (Signed)
Pt calling for diabetes and bp med refill, last appt was in Nov and was advised he would need to see Dr first. Pt is all out of meds and wanted to walkin on week of 07/04/14, pt was advised that walkins would not be helpful since he needs to schedule appt with Dr but not appts available. Please f/u with pt.

## 2014-07-27 ENCOUNTER — Encounter: Payer: Self-pay | Admitting: Internal Medicine

## 2014-07-27 ENCOUNTER — Ambulatory Visit: Payer: No Typology Code available for payment source | Attending: Internal Medicine | Admitting: Internal Medicine

## 2014-07-27 VITALS — BP 122/80 | HR 103 | Temp 98.6°F | Resp 15 | Wt 278.6 lb

## 2014-07-27 DIAGNOSIS — Z8249 Family history of ischemic heart disease and other diseases of the circulatory system: Secondary | ICD-10-CM | POA: Insufficient documentation

## 2014-07-27 DIAGNOSIS — E1122 Type 2 diabetes mellitus with diabetic chronic kidney disease: Secondary | ICD-10-CM | POA: Insufficient documentation

## 2014-07-27 DIAGNOSIS — Z833 Family history of diabetes mellitus: Secondary | ICD-10-CM | POA: Insufficient documentation

## 2014-07-27 DIAGNOSIS — E785 Hyperlipidemia, unspecified: Secondary | ICD-10-CM | POA: Insufficient documentation

## 2014-07-27 DIAGNOSIS — E089 Diabetes mellitus due to underlying condition without complications: Secondary | ICD-10-CM

## 2014-07-27 DIAGNOSIS — K5900 Constipation, unspecified: Secondary | ICD-10-CM | POA: Insufficient documentation

## 2014-07-27 DIAGNOSIS — E139 Other specified diabetes mellitus without complications: Secondary | ICD-10-CM | POA: Insufficient documentation

## 2014-07-27 DIAGNOSIS — I10 Essential (primary) hypertension: Secondary | ICD-10-CM | POA: Insufficient documentation

## 2014-07-27 LAB — POCT URINALYSIS DIPSTICK
BILIRUBIN UA: NEGATIVE
Glucose, UA: 500
Ketones, UA: NEGATIVE
Leukocytes, UA: NEGATIVE
NITRITE UA: NEGATIVE
PH UA: 5
PROTEIN UA: NEGATIVE
RBC UA: NEGATIVE
Spec Grav, UA: <=1.005
Urobilinogen, UA: 0.2

## 2014-07-27 LAB — GLUCOSE, POCT (MANUAL RESULT ENTRY)
POC Glucose: 469 mg/dl — AB (ref 70–99)
POC Glucose: 443 mg/dl — AB (ref 70–99)

## 2014-07-27 LAB — POCT GLYCOSYLATED HEMOGLOBIN (HGB A1C): HEMOGLOBIN A1C: 12.7

## 2014-07-27 MED ORDER — MAGNESIUM HYDROXIDE 400 MG/5ML PO SUSP: 15.0000 mL | Freq: Every day | ORAL | Status: DC | PRN

## 2014-07-27 MED ORDER — LISINOPRIL-HYDROCHLOROTHIAZIDE 20-12.5 MG PO TABS: 1.0000 | ORAL_TABLET | Freq: Every day | ORAL | Status: DC

## 2014-07-27 MED ORDER — METFORMIN HCL ER 500 MG PO TB24: 1000.0000 mg | ORAL_TABLET | Freq: Two times a day (BID) | ORAL | Status: DC

## 2014-07-27 MED ORDER — INSULIN ASPART 100 UNIT/ML ~~LOC~~ SOLN
20.0000 [IU] | Freq: Once | SUBCUTANEOUS | Status: AC
  Administered 2014-07-27: 20 [IU] via SUBCUTANEOUS

## 2014-07-27 MED ORDER — GLIPIZIDE 10 MG PO TABS: 20.0000 mg | ORAL_TABLET | Freq: Two times a day (BID) | ORAL | Status: DC

## 2014-07-27 MED ORDER — ATORVASTATIN CALCIUM 20 MG PO TABS: 20.0000 mg | ORAL_TABLET | Freq: Every day | ORAL | Status: DC

## 2014-07-27 NOTE — Progress Notes (Signed)
MRN: [id number] Name: Raymond Ryan  Sex: male Age: 48 y.o. DOB: 04/19/1966  Allergies: Review of patient's allergies indicates no known allergies.  Chief Complaint  Patient presents with  . Diabetes    HPI: Patient is 48 y.o. male who has history of diabetes hypertension hyperlipidemia patient comes today for followup as per patient for the last few days he ran out of his medications and is requesting refill, today's blood sugar is elevated, was given insulin his urine is negative for ketones patient has some symptoms of polyuria polydipsia denies any headache dizziness chest and shortness of breath. Patient denies smoking cigarettes. As per patient he was also on Lipitor in the past for high cholesterol. Patient has also eaten today. Patient also reported to have constipation and has to strain. Past Medical History  Diagnosis Date  . Diabetes mellitus   . Hypertension     History reviewed. No pertinent past surgical history.    Medication List       This list is accurate as of: 07/27/14  2:47 PM.  Always use your most recent med list.               atorvastatin 20 MG tablet  Commonly known as:  LIPITOR  Take 1 tablet (20 mg total) by mouth daily.     doxycycline 100 MG tablet  Commonly known as:  VIBRA-TABS  Take 1 tablet (100 mg total) by mouth 2 (two) times daily.     gabapentin 300 MG capsule  Commonly known as:  NEURONTIN  Take 1 capsule (300 mg total) by mouth at bedtime as needed.     glipiZIDE 10 MG tablet  Commonly known as:  GLUCOTROL  Take 2 tablets (20 mg total) by mouth 2 (two) times daily before a meal.     lisinopril-hydrochlorothiazide 20-12.5 MG per tablet  Commonly known as:  ZESTORETIC  Take 1 tablet by mouth daily.     magnesium hydroxide 400 MG/5ML suspension  Commonly known as:  MILK OF MAGNESIA  Take 15 mLs by mouth daily as needed for mild constipation.     metFORMIN 500 MG 24 hr tablet  Commonly known as:  GLUCOPHAGE XR  Take  2 tablets (1,000 mg total) by mouth 2 (two) times daily with a meal.     oxyCODONE-acetaminophen 5-325 MG per tablet  Commonly known as:  PERCOCET/ROXICET  Take 1-2 tablets by mouth every 6 (six) hours as needed. For pain        Meds ordered this encounter  Medications  . insulin aspart (novoLOG) injection 20 Units    Sig:   . glipiZIDE (GLUCOTROL) 10 MG tablet    Sig: Take 2 tablets (20 mg total) by mouth 2 (two) times daily before a meal.    Dispense:  120 tablet    Refill:  4  . metFORMIN (GLUCOPHAGE XR) 500 MG 24 hr tablet    Sig: Take 2 tablets (1,000 mg total) by mouth 2 (two) times daily with a meal.    Dispense:  120 tablet    Refill:  4  . atorvastatin (LIPITOR) 20 MG tablet    Sig: Take 1 tablet (20 mg total) by mouth daily.    Dispense:  90 tablet    Refill:  3  . lisinopril-hydrochlorothiazide (ZESTORETIC) 20-12.5 MG per tablet    Sig: Take 1 tablet by mouth daily.    Dispense:  30 tablet    Refill:  3  . magnesium hydroxide (MILK  OF MAGNESIA) 400 MG/5ML suspension    Sig: Take 15 mLs by mouth daily as needed for mild constipation.    Dispense:  360 mL    Refill:  0    Immunization History  Administered Date(s) Administered  . Influenza Split 11/02/2012  . Pneumococcal Polysaccharide-23 11/02/2012  . Tdap 11/02/2012    Family History  Problem Relation Age of Onset  . Diabetes Mellitus II Mother   . CAD Mother     History  Substance Use Topics  . Smoking status: Never Smoker   . Smokeless tobacco: Not on file  . Alcohol Use: No    Review of Systems   As noted in HPI  Filed Vitals:   07/27/14 1419  BP: 122/80  Pulse: 103  Temp: 98.6 F (37 C)  Resp: 15    Physical Exam  Physical Exam  HENT:  Moist oral mucosa  Eyes: EOM are normal. Pupils are equal, round, and reactive to light.  Cardiovascular: Normal rate and regular rhythm.   Pulmonary/Chest: Breath sounds normal. No respiratory distress. He has no wheezes. He has no rales.    Abdominal: Soft. There is no tenderness.  Musculoskeletal: He exhibits no edema.    CBC    Component Value Date/Time   WBC 7.3 11/02/2012 0730   RBC 4.43 11/02/2012 0730   HGB 12.4* 11/02/2012 0730   HCT 37.8* 11/02/2012 0730   PLT 549* 11/02/2012 0730   MCV 85.3 11/02/2012 0730   LYMPHSABS 2.1 11/02/2012 0730   MONOABS 0.6 11/02/2012 0730   EOSABS 0.4 11/02/2012 0730   BASOSABS 0.1 11/02/2012 0730    CMP     Component Value Date/Time   NA 138 07/13/2013 1105   K 4.6 07/13/2013 1105   CL 99 07/13/2013 1105   CO2 30 07/13/2013 1105   GLUCOSE 289* 07/13/2013 1105   BUN 16 07/13/2013 1105   CREATININE 1.35 07/13/2013 1105   CREATININE 1.14 11/02/2012 0730   CALCIUM 10.3 07/13/2013 1105   PROT 7.9 07/13/2013 1105   ALBUMIN 4.4 07/13/2013 1105   AST 17 07/13/2013 1105   ALT 20 07/13/2013 1105   ALKPHOS 95 07/13/2013 1105   BILITOT 0.5 07/13/2013 1105   GFRNONAA 63 07/13/2013 1105   GFRNONAA 76* 11/02/2012 0730   GFRAA 73 07/13/2013 1105   GFRAA 88* 11/02/2012 0730    Lab Results  Component Value Date/Time   CHOL 156 11/03/2013 10:29 AM    No components found with this basename: hga1c    Lab Results  Component Value Date/Time   AST 17 07/13/2013 11:05 AM    Assessment and Plan  Diabetes mellitus due to underlying condition without complications - Plan:  Results for orders placed in visit on 07/27/14  GLUCOSE, POCT (MANUAL RESULT ENTRY)      Result Value Ref Range   POC Glucose 469 (*) 70 - 99 mg/dl  POCT GLYCOSYLATED HEMOGLOBIN (HGB A1C)      Result Value Ref Range   Hemoglobin A1C 12.7    POCT URINALYSIS DIPSTICK      Result Value Ref Range   Color, UA yellow     Clarity, UA clear     Glucose, UA 500     Bilirubin, UA neg     Ketones, UA neg     Spec Grav, UA <=1.005     Blood, UA neg     pH, UA 5.0     Protein, UA neg     Urobilinogen, UA 0.2  Nitrite, UA neg     Leukocytes, UA Negative    GLUCOSE, POCT (MANUAL RESULT ENTRY)      Result Value Ref Range   POC  Glucose 443 (*) 70 - 99 mg/dl   Urine is negative for ketones. Diabetes is uncontrolled secondary to patient being off medications, resume back on metformin, I have increased the dose of Glucotrol to 20 mg twice a day, advise patient to keep the fingerstick log, he'll come back in 2 weeks for CBG check nurses it. Glucose (CBG), HgB A1c, insulin aspart (novoLOG) injection 20 Units, Urinalysis Dipstick, glipiZIDE (GLUCOTROL) 10 MG tablet, metFORMIN (GLUCOPHAGE XR) 500 MG 24 hr tablet  Essential hypertension - Plan: Resume back on lisinopril-hydrochlorothiazide (ZESTORETIC) 20-12.5 MG per tablet, advised for DASH diet.  Other and unspecified hyperlipidemia - Plan: Resume back on atorvastatin (LIPITOR) 20 MG tablet, will do fasting lipid panel.  Unspecified constipation - Plan: Advised patient to increase fiber diet, trial of magnesium hydroxide (MILK OF MAGNESIA) 400 MG/5ML suspension   Return in about 3 months (around 10/27/2014) for diabetes,  CBG check in 2 weeks/Nurse Visit.  Lorayne Marek, MD

## 2014-07-27 NOTE — Progress Notes (Signed)
Patient here for follow up on his diabetes Presents in office today with elevated blood sugar

## 2014-07-27 NOTE — Patient Instructions (Signed)
DASH Eating Plan DASH stands for "Dietary Approaches to Stop Hypertension." The DASH eating plan is a healthy eating plan that has been shown to reduce high blood pressure (hypertension). Additional health benefits may include reducing the risk of type 2 diabetes mellitus, heart disease, and stroke. The DASH eating plan may also help with weight loss. WHAT DO I NEED TO KNOW ABOUT THE DASH EATING PLAN? For the DASH eating plan, you will follow these general guidelines:  Choose foods with a percent daily value for sodium of less than 5% (as listed on the food label).  Use salt-free seasonings or herbs instead of table salt or sea salt.  Check with your health care provider or pharmacist before using salt substitutes.  Eat lower-sodium products, often labeled as "lower sodium" or "no salt added."  Eat fresh foods.  Eat more vegetables, fruits, and low-fat dairy products.  Choose whole grains. Look for the word "whole" as the first word in the ingredient list.  Choose fish and skinless chicken or turkey more often than red meat. Limit fish, poultry, and meat to 6 oz (170 g) each day.  Limit sweets, desserts, sugars, and sugary drinks.  Choose heart-healthy fats.  Limit cheese to 1 oz (28 g) per day.  Eat more home-cooked food and less restaurant, buffet, and fast food.  Limit fried foods.  Cook foods using methods other than frying.  Limit canned vegetables. If you do use them, rinse them well to decrease the sodium.  When eating at a restaurant, ask that your food be prepared with less salt, or no salt if possible. WHAT FOODS CAN I EAT? Seek help from a dietitian for individual calorie needs. Grains Whole grain or whole wheat bread. Brown rice. Whole grain or whole wheat pasta. Quinoa, bulgur, and whole grain cereals. Low-sodium cereals. Corn or whole wheat flour tortillas. Whole grain cornbread. Whole grain crackers. Low-sodium crackers. Vegetables Fresh or frozen vegetables  (raw, steamed, roasted, or grilled). Low-sodium or reduced-sodium tomato and vegetable juices. Low-sodium or reduced-sodium tomato sauce and paste. Low-sodium or reduced-sodium canned vegetables.  Fruits All fresh, canned (in natural juice), or frozen fruits. Meat and Other Protein Products Ground beef (85% or leaner), grass-fed beef, or beef trimmed of fat. Skinless chicken or turkey. Ground chicken or turkey. Pork trimmed of fat. All fish and seafood. Eggs. Dried beans, peas, or lentils. Unsalted nuts and seeds. Unsalted canned beans. Dairy Low-fat dairy products, such as skim or 1% milk, 2% or reduced-fat cheeses, low-fat ricotta or cottage cheese, or plain low-fat yogurt. Low-sodium or reduced-sodium cheeses. Fats and Oils Tub margarines without trans fats. Light or reduced-fat mayonnaise and salad dressings (reduced sodium). Avocado. Safflower, olive, or canola oils. Natural peanut or almond butter. Other Unsalted popcorn and pretzels. The items listed above may not be a complete list of recommended foods or beverages. Contact your dietitian for more options. WHAT FOODS ARE NOT RECOMMENDED? Grains White bread. White pasta. White rice. Refined cornbread. Bagels and croissants. Crackers that contain trans fat. Vegetables Creamed or fried vegetables. Vegetables in a cheese sauce. Regular canned vegetables. Regular canned tomato sauce and paste. Regular tomato and vegetable juices. Fruits Dried fruits. Canned fruit in light or heavy syrup. Fruit juice. Meat and Other Protein Products Fatty cuts of meat. Ribs, chicken wings, bacon, sausage, bologna, salami, chitterlings, fatback, hot dogs, bratwurst, and packaged luncheon meats. Salted nuts and seeds. Canned beans with salt. Dairy Whole or 2% milk, cream, half-and-half, and cream cheese. Whole-fat or sweetened yogurt. Full-fat   cheeses or blue cheese. Nondairy creamers and whipped toppings. Processed cheese, cheese spreads, or cheese  curds. Condiments Onion and garlic salt, seasoned salt, table salt, and sea salt. Canned and packaged gravies. Worcestershire sauce. Tartar sauce. Barbecue sauce. Teriyaki sauce. Soy sauce, including reduced sodium. Steak sauce. Fish sauce. Oyster sauce. Cocktail sauce. Horseradish. Ketchup and mustard. Meat flavorings and tenderizers. Bouillon cubes. Hot sauce. Tabasco sauce. Marinades. Taco seasonings. Relishes. Fats and Oils Butter, stick margarine, lard, shortening, ghee, and bacon fat. Coconut, palm kernel, or palm oils. Regular salad dressings. Other Pickles and olives. Salted popcorn and pretzels. The items listed above may not be a complete list of foods and beverages to avoid. Contact your dietitian for more information. WHERE CAN I FIND MORE INFORMATION? National [employer], [employer], [employer]: travelstabloid.com Document Released: 12/05/2011 Document Revised: 05/02/2014 Document Reviewed: 10/20/2013 [facility] Patient Information 2015 [place], [place]. This information is not intended to replace advice given to you by your health care provider. Make sure you discuss any questions you have with your health care provider. Diabetes and Exercise Exercising regularly is important. It is not just about losing weight. It has many health benefits, such as:  Improving your overall fitness, flexibility, and endurance.  Increasing your bone density.  Helping with weight control.  Decreasing your body fat.  Increasing your muscle strength.  Reducing stress and tension.  Improving your overall health. People with diabetes who exercise gain additional benefits because exercise:  Reduces appetite.  Improves the body's use of blood sugar (glucose).  Helps lower or control blood glucose.  Decreases blood pressure.  Helps control blood lipids (such as cholesterol and triglycerides).  Improves the body's use of the hormone insulin by:  Increasing the  body's insulin sensitivity.  Reducing the body's insulin needs.  Decreases the risk for heart disease because exercising:  Lowers cholesterol and triglycerides levels.  Increases the levels of good cholesterol (such as high-density lipoproteins [HDL]) in the body.  Lowers blood glucose levels. YOUR ACTIVITY PLAN  Choose an activity that you enjoy and set realistic goals. Your health care provider or diabetes educator can help you make an activity plan that works for you. Exercise regularly as directed by your health care provider. This includes:  Performing resistance training twice a week such as push-ups, sit-ups, lifting weights, or using resistance bands.  Performing 150 minutes of cardio exercises each week such as walking, running, or playing sports.  Staying active and spending no more than 90 minutes at one time being inactive. Even short bursts of exercise are good for you. Three 10-minute sessions spread throughout the day are just as beneficial as a single 30-minute session. Some exercise ideas include:  Taking the dog for a walk.  Taking the stairs instead of the elevator.  Dancing to your favorite song.  Doing an exercise video.  Doing your favorite exercise with a friend. RECOMMENDATIONS FOR EXERCISING WITH TYPE 1 OR TYPE 2 DIABETES   Check your blood glucose before exercising. If blood glucose levels are greater than 240 mg/dL, check for urine ketones. Do not exercise if ketones are present.  Avoid injecting insulin into areas of the body that are going to be exercised. For example, avoid injecting insulin into:  The arms when playing tennis.  The legs when jogging.  Keep a record of:  Food intake before and after you exercise.  Expected peak times of insulin action.  Blood glucose levels before and after you exercise.  The type and amount of exercise  you have done.  Review your records with your health care provider. Your health care provider will  help you to develop guidelines for adjusting food intake and insulin amounts before and after exercising.  If you take insulin or oral hypoglycemic agents, watch for signs and symptoms of hypoglycemia. They include:  Dizziness.  Shaking.  Sweating.  Chills.  Confusion.  Drink plenty of water while you exercise to prevent dehydration or heat stroke. Body water is lost during exercise and must be replaced.  Talk to your health care provider before starting an exercise program to make sure it is safe for you. Remember, almost any type of activity is better than none. Document Released: 03/07/2004 Document Revised: 05/02/2014 Document Reviewed: 05/25/2013 [employer] Patient Information 2015 [employer]. This information is not intended to replace advice given to you by your health care provider. Make sure you discuss any questions you have with your health care provider.  

## 2014-08-10 ENCOUNTER — Other Ambulatory Visit: Payer: No Typology Code available for payment source

## 2015-01-08 ENCOUNTER — Other Ambulatory Visit: Payer: Self-pay | Admitting: Internal Medicine

## 2015-04-05 ENCOUNTER — Ambulatory Visit: Payer: Self-pay | Attending: Internal Medicine | Admitting: Internal Medicine

## 2015-04-05 ENCOUNTER — Encounter: Payer: Self-pay | Admitting: Internal Medicine

## 2015-04-05 VITALS — BP 143/95 | HR 102 | Temp 98.0°F | Resp 16 | Wt 276.4 lb

## 2015-04-05 DIAGNOSIS — Z794 Long term (current) use of insulin: Secondary | ICD-10-CM | POA: Insufficient documentation

## 2015-04-05 DIAGNOSIS — K029 Dental caries, unspecified: Secondary | ICD-10-CM | POA: Insufficient documentation

## 2015-04-05 DIAGNOSIS — E7800 Pure hypercholesterolemia, unspecified: Secondary | ICD-10-CM

## 2015-04-05 DIAGNOSIS — E139 Other specified diabetes mellitus without complications: Secondary | ICD-10-CM

## 2015-04-05 DIAGNOSIS — E119 Type 2 diabetes mellitus without complications: Secondary | ICD-10-CM | POA: Insufficient documentation

## 2015-04-05 DIAGNOSIS — H538 Other visual disturbances: Secondary | ICD-10-CM | POA: Insufficient documentation

## 2015-04-05 DIAGNOSIS — I10 Essential (primary) hypertension: Secondary | ICD-10-CM | POA: Insufficient documentation

## 2015-04-05 LAB — POCT GLYCOSYLATED HEMOGLOBIN (HGB A1C): HEMOGLOBIN A1C: 14

## 2015-04-05 LAB — GLUCOSE, POCT (MANUAL RESULT ENTRY): POC Glucose: 297 mg/dl — AB (ref 70–99)

## 2015-04-05 MED ORDER — ATORVASTATIN CALCIUM 20 MG PO TABS: 20.0000 mg | ORAL_TABLET | Freq: Every day | ORAL | Status: DC

## 2015-04-05 MED ORDER — GLIPIZIDE 10 MG PO TABS: 20.0000 mg | ORAL_TABLET | Freq: Two times a day (BID) | ORAL | Status: DC

## 2015-04-05 MED ORDER — GABAPENTIN 300 MG PO CAPS: 300.0000 mg | ORAL_CAPSULE | Freq: Every evening | ORAL | Status: DC | PRN

## 2015-04-05 MED ORDER — METFORMIN HCL ER 500 MG PO TB24: 1000.0000 mg | ORAL_TABLET | Freq: Two times a day (BID) | ORAL | Status: DC

## 2015-04-05 MED ORDER — LISINOPRIL-HYDROCHLOROTHIAZIDE 20-12.5 MG PO TABS: 1.0000 | ORAL_TABLET | Freq: Every day | ORAL | Status: DC

## 2015-04-05 MED ORDER — GLIPIZIDE 10 MG PO TABS
20.0000 mg | ORAL_TABLET | Freq: Two times a day (BID) | ORAL | Status: DC
Start: 2015-04-05 — End: 2015-04-05

## 2015-04-05 MED ORDER — INSULIN GLARGINE 100 UNIT/ML SOLOSTAR PEN: 15.0000 [IU] | PEN_INJECTOR | Freq: Every day | SUBCUTANEOUS | Status: DC

## 2015-04-05 NOTE — Patient Instructions (Signed)
Diabetes Mellitus and Food It is important for you to manage your blood sugar (glucose) level. Your blood glucose level can be greatly affected by what you eat. Eating healthier foods in the appropriate amounts throughout the day at about the same time each day will help you control your blood glucose level. It can also help slow or prevent worsening of your diabetes mellitus. Healthy eating may even help you improve the level of your blood pressure and reach or maintain a healthy weight.  HOW CAN FOOD AFFECT ME? Carbohydrates Carbohydrates affect your blood glucose level more than any other type of food. Your dietitian will help you determine how many carbohydrates to eat at each meal and teach you how to count carbohydrates. Counting carbohydrates is important to keep your blood glucose at a healthy level, especially if you are using insulin or taking certain medicines for diabetes mellitus. Alcohol Alcohol can cause sudden decreases in blood glucose (hypoglycemia), especially if you use insulin or take certain medicines for diabetes mellitus. Hypoglycemia can be a life-threatening condition. Symptoms of hypoglycemia (sleepiness, dizziness, and disorientation) are similar to symptoms of having too much alcohol.  If your health care provider has given you approval to drink alcohol, do so in moderation and use the following guidelines:  Women should not have more than one drink per day, and men should not have more than two drinks per day. One drink is equal to:  12 oz of beer.  5 oz of wine.  1 oz of hard liquor.  Do not drink on an empty stomach.  Keep yourself hydrated. Have water, diet soda, or unsweetened iced tea.  Regular soda, juice, and other mixers might contain a lot of carbohydrates and should be counted. WHAT FOODS ARE NOT RECOMMENDED? As you make food choices, it is important to remember that all foods are not the same. Some foods have fewer nutrients per serving than other  foods, even though they might have the same number of calories or carbohydrates. It is difficult to get your body what it needs when you eat foods with fewer nutrients. Examples of foods that you should avoid that are high in calories and carbohydrates but low in nutrients include:  Trans fats (most processed foods list trans fats on the Nutrition Facts label).  Regular soda.  Juice.  Candy.  Sweets, such as cake, pie, doughnuts, and cookies.  Fried foods. WHAT FOODS CAN I EAT? Have nutrient-rich foods, which will nourish your body and keep you healthy. The food you should eat also will depend on several factors, including:  The calories you need.  The medicines you take.  Your weight.  Your blood glucose level.  Your blood pressure level.  Your cholesterol level. You also should eat a variety of foods, including:  Protein, such as meat, poultry, fish, tofu, nuts, and seeds (lean animal proteins are best).  Fruits.  Vegetables.  Dairy products, such as milk, cheese, and yogurt (low fat is best).  Breads, grains, pasta, cereal, rice, and beans.  Fats such as olive oil, trans fat-free margarine, canola oil, avocado, and olives. DOES EVERYONE WITH DIABETES MELLITUS HAVE THE SAME MEAL PLAN? Because every person with diabetes mellitus is different, there is not one meal plan that works for everyone. It is very important that you meet with a dietitian who will help you create a meal plan that is just right for you. Document Released: 09/12/2005 Document Revised: 12/21/2013 Document Reviewed: 11/12/2013 Exit[employer] Patient Information 2015 [employer]. This   information is not intended to replace advice given to you by your health care provider. Make sure you discuss any questions you have with your health care provider. DASH Eating Plan DASH stands for "Dietary Approaches to Stop Hypertension." The DASH eating plan is a healthy eating plan that has been shown to reduce high  blood pressure (hypertension). Additional health benefits may include reducing the risk of type 2 diabetes mellitus, heart disease, and stroke. The DASH eating plan may also help with weight loss. WHAT DO I NEED TO KNOW ABOUT THE DASH EATING PLAN? For the DASH eating plan, you will follow these general guidelines:  Choose foods with a percent daily value for sodium of less than 5% (as listed on the food label).  Use salt-free seasonings or herbs instead of table salt or sea salt.  Check with your health care provider or pharmacist before using salt substitutes.  Eat lower-sodium products, often labeled as "lower sodium" or "no salt added."  Eat fresh foods.  Eat more vegetables, fruits, and low-fat dairy products.  Choose whole grains. Look for the word "whole" as the first word in the ingredient list.  Choose fish and skinless chicken or turkey more often than red meat. Limit fish, poultry, and meat to 6 oz (170 g) each day.  Limit sweets, desserts, sugars, and sugary drinks.  Choose heart-healthy fats.  Limit cheese to 1 oz (28 g) per day.  Eat more home-cooked food and less restaurant, buffet, and fast food.  Limit fried foods.  Cook foods using methods other than frying.  Limit canned vegetables. If you do use them, rinse them well to decrease the sodium.  When eating at a restaurant, ask that your food be prepared with less salt, or no salt if possible. WHAT FOODS CAN I EAT? Seek help from a dietitian for individual calorie needs. Grains Whole grain or whole wheat bread. Brown rice. Whole grain or whole wheat pasta. Quinoa, bulgur, and whole grain cereals. Low-sodium cereals. Corn or whole wheat flour tortillas. Whole grain cornbread. Whole grain crackers. Low-sodium crackers. Vegetables Fresh or frozen vegetables (raw, steamed, roasted, or grilled). Low-sodium or reduced-sodium tomato and vegetable juices. Low-sodium or reduced-sodium tomato sauce and paste. Low-sodium  or reduced-sodium canned vegetables.  Fruits All fresh, canned (in natural juice), or frozen fruits. Meat and Other Protein Products Ground beef (85% or leaner), grass-fed beef, or beef trimmed of fat. Skinless chicken or turkey. Ground chicken or turkey. Pork trimmed of fat. All fish and seafood. Eggs. Dried beans, peas, or lentils. Unsalted nuts and seeds. Unsalted canned beans. Dairy Low-fat dairy products, such as skim or 1% milk, 2% or reduced-fat cheeses, low-fat ricotta or cottage cheese, or plain low-fat yogurt. Low-sodium or reduced-sodium cheeses. Fats and Oils Tub margarines without trans fats. Light or reduced-fat mayonnaise and salad dressings (reduced sodium). Avocado. Safflower, olive, or canola oils. Natural peanut or almond butter. Other Unsalted popcorn and pretzels. The items listed above may not be a complete list of recommended foods or beverages. Contact your dietitian for more options. WHAT FOODS ARE NOT RECOMMENDED? Grains White bread. White pasta. White rice. Refined cornbread. Bagels and croissants. Crackers that contain trans fat. Vegetables Creamed or fried vegetables. Vegetables in a cheese sauce. Regular canned vegetables. Regular canned tomato sauce and paste. Regular tomato and vegetable juices. Fruits Dried fruits. Canned fruit in light or heavy syrup. Fruit juice. Meat and Other Protein Products Fatty cuts of meat. Ribs, chicken wings, bacon, sausage, bologna, salami, chitterlings, fatback, hot   dogs, bratwurst, and packaged luncheon meats. Salted nuts and seeds. Canned beans with salt. Dairy Whole or 2% milk, cream, half-and-half, and cream cheese. Whole-fat or sweetened yogurt. Full-fat cheeses or blue cheese. Nondairy creamers and whipped toppings. Processed cheese, cheese spreads, or cheese curds. Condiments Onion and garlic salt, seasoned salt, table salt, and sea salt. Canned and packaged gravies. Worcestershire sauce. Tartar sauce. Barbecue sauce.  Teriyaki sauce. Soy sauce, including reduced sodium. Steak sauce. Fish sauce. Oyster sauce. Cocktail sauce. Horseradish. Ketchup and mustard. Meat flavorings and tenderizers. Bouillon cubes. Hot sauce. Tabasco sauce. Marinades. Taco seasonings. Relishes. Fats and Oils Butter, stick margarine, lard, shortening, ghee, and bacon fat. Coconut, palm kernel, or palm oils. Regular salad dressings. Other Pickles and olives. Salted popcorn and pretzels. The items listed above may not be a complete list of foods and beverages to avoid. Contact your dietitian for more information. WHERE CAN I FIND MORE INFORMATION? National Heart, [employer], [employer] Institute: www.nhlbi.nih.gov/health/health-topics/topics/dash/ Document Released: 12/05/2011 Document Revised: 05/02/2014 Document Reviewed: 10/20/2013 Exit[employer] Patient Information 2015 [employer]. This information is not intended to replace advice given to you by your health care provider. Make sure you discuss any questions you have with your health care provider.  

## 2015-04-05 NOTE — Progress Notes (Signed)
Patient here for follow up on his diabetes Patient is requesting referrals to podiatry, dentist and eye doctor

## 2015-04-05 NOTE — Progress Notes (Signed)
MRN: [id number] Name: Raymond Ryan  Sex: male Age: 49 y.o. DOB: 23-Feb-1966  Allergies: Review of patient's allergies indicates no known allergies.  Chief Complaint  Patient presents with  . Follow-up    HPI: Patient is 49 y.o. male who has history of diabetes hypertension hyperlipidemia, he was seen in our office last year, as per patient he ran out of his medications currently his blood pressure is borderline elevated, denies any headache dizziness but does complain of blurry vision and is requesting referral to see an ophthalmologist, also has several dental cavities and is requesting to see a dentist, he has lost follow up with his podiatrist and needs the referral,his diabetes is uncontrolled his hemoglobin A1c has trended up to 14 %.  Past Medical History  Diagnosis Date  . Diabetes mellitus   . Hypertension     History reviewed. No pertinent past surgical history.    Medication List       This list is accurate as of: 04/05/15 12:27 PM.  Always use your most recent med list.               atorvastatin 20 MG tablet  Commonly known as:  LIPITOR  Take 1 tablet (20 mg total) by mouth daily.     doxycycline 100 MG tablet  Commonly known as:  VIBRA-TABS  Take 1 tablet (100 mg total) by mouth 2 (two) times daily.     gabapentin 300 MG capsule  Commonly known as:  NEURONTIN  Take 1 capsule (300 mg total) by mouth at bedtime as needed.     glipiZIDE 10 MG tablet  Commonly known as:  GLUCOTROL  Take 2 tablets (20 mg total) by mouth 2 (two) times daily before a meal.     Insulin Glargine 100 UNIT/ML Solostar Pen  Commonly known as:  LANTUS  Inject 15 Units into the skin daily at 10 pm.     lisinopril-hydrochlorothiazide 20-12.5 MG per tablet  Commonly known as:  PRINZIDE,ZESTORETIC  Take 1 tablet by mouth daily.     magnesium hydroxide 400 MG/5ML suspension  Commonly known as:  MILK OF MAGNESIA  Take 15 mLs by mouth daily as needed for mild constipation.     metFORMIN 500 MG 24 hr tablet  Commonly known as:  GLUCOPHAGE XR  Take 2 tablets (1,000 mg total) by mouth 2 (two) times daily with a meal.     oxyCODONE-acetaminophen 5-325 MG per tablet  Commonly known as:  PERCOCET/ROXICET  Take 1-2 tablets by mouth every 6 (six) hours as needed. For pain        Meds ordered this encounter  Medications  . atorvastatin (LIPITOR) 20 MG tablet    Sig: Take 1 tablet (20 mg total) by mouth daily.    Dispense:  90 tablet    Refill:  3  . gabapentin (NEURONTIN) 300 MG capsule    Sig: Take 1 capsule (300 mg total) by mouth at bedtime as needed.    Dispense:  30 capsule    Refill:  3  . glipiZIDE (GLUCOTROL) 10 MG tablet    Sig: Take 2 tablets (20 mg total) by mouth 2 (two) times daily before a meal.    Dispense:  120 tablet    Refill:  3  . lisinopril-hydrochlorothiazide (PRINZIDE,ZESTORETIC) 20-12.5 MG per tablet    Sig: Take 1 tablet by mouth daily.    Dispense:  30 tablet    Refill:  3  . metFORMIN (GLUCOPHAGE XR) 500  MG 24 hr tablet    Sig: Take 2 tablets (1,000 mg total) by mouth 2 (two) times daily with a meal.    Dispense:  120 tablet    Refill:  4  . Insulin Glargine (LANTUS) 100 UNIT/ML Solostar Pen    Sig: Inject 15 Units into the skin daily at 10 pm.    Dispense:  15 mL    Refill:  3    Immunization History  Administered Date(s) Administered  . Influenza Split 11/02/2012  . Pneumococcal Polysaccharide-23 11/02/2012  . Tdap 11/02/2012    Family History  Problem Relation Age of Onset  . Diabetes Mellitus II Mother   . CAD Mother     History  Substance Use Topics  . Smoking status: Never Smoker   . Smokeless tobacco: Not on file  . Alcohol Use: No    Review of Systems   As noted in HPI  Filed Vitals:   04/05/15 1206  BP: 143/95  Pulse: 102  Temp: 98 F (36.7 C)  Resp: 16    Physical Exam  Physical Exam  Constitutional:  Obese male sitting comfortably not in acute distress  HENT:  Dental cavities    Eyes: EOM are normal. Pupils are equal, round, and reactive to light.  Cardiovascular: Normal rate and regular rhythm.   Pulmonary/Chest: Breath sounds normal. No respiratory distress. He has no wheezes. He has no rales.  Musculoskeletal: He exhibits no edema.    CBC    Component Value Date/Time   WBC 7.3 11/02/2012 0730   RBC 4.43 11/02/2012 0730   HGB 12.4* 11/02/2012 0730   HCT 37.8* 11/02/2012 0730   PLT 549* 11/02/2012 0730   MCV 85.3 11/02/2012 0730   LYMPHSABS 2.1 11/02/2012 0730   MONOABS 0.6 11/02/2012 0730   EOSABS 0.4 11/02/2012 0730   BASOSABS 0.1 11/02/2012 0730    CMP     Component Value Date/Time   NA 138 07/13/2013 1105   K 4.6 07/13/2013 1105   CL 99 07/13/2013 1105   CO2 30 07/13/2013 1105   GLUCOSE 289* 07/13/2013 1105   BUN 16 07/13/2013 1105   CREATININE 1.35 07/13/2013 1105   CREATININE 1.14 11/02/2012 0730   CALCIUM 10.3 07/13/2013 1105   PROT 7.9 07/13/2013 1105   ALBUMIN 4.4 07/13/2013 1105   AST 17 07/13/2013 1105   ALT 20 07/13/2013 1105   ALKPHOS 95 07/13/2013 1105   BILITOT 0.5 07/13/2013 1105   GFRNONAA 63 07/13/2013 1105   GFRNONAA 76* 11/02/2012 0730   GFRAA 73 07/13/2013 1105   GFRAA 88* 11/02/2012 0730    Lab Results  Component Value Date/Time   CHOL 156 11/03/2013 10:29 AM    No components found for: HGA1C  Lab Results  Component Value Date/Time   AST 17 07/13/2013 11:05 AM    Assessment and Plan  Other specified diabetes mellitus without complications - Plan:  Results for orders placed or performed in visit on 04/05/15  Glucose (CBG)  Result Value Ref Range   POC Glucose 297.0 (A) 70 - 99 mg/dl  HgB A1c  Result Value Ref Range   Hemoglobin A1C 14.0    Diabetes is uncontrolled, he will be resumed back on metformin, Glucotrol, I have started patient on Lantus 15 units each bedtime, advise patient for diabetes meal planning, keep the fingerstick log, he will come back in 3 weeks per nurse visit CBG check HgB A1c,  glipiZIDE (GLUCOTROL) 10 MG tablet, metFORMIN (GLUCOPHAGE XR) 500 MG 24 hr tablet,  Ambulatory referral to Podiatry, Insulin Glargine (LANTUS) 100 UNIT/ML Solostar Pen  High cholesterol - Plan: resume back on atorvastatin (LIPITOR) 20 MG tablet,recheck Lipid panel  Essential hypertension - Plan:advised patient for DASH diet, resume back on  lisinopril-hydrochlorothiazide (PRINZIDE,ZESTORETIC) 20-12.5 MG per tablet, COMPLETE METABOLIC PANEL WITH GFR  Blurry vision - Plan: Ambulatory referral to Ophthalmology  Dental cavities - Plan: Ambulatory referral to Dentistry    Return in about 3 months (around 07/05/2015) for diabetes, hypertension, hyperipidemia, BP check in 3 weeks/Nurse Visit.   This note has been created with Surveyor, quantity. Any transcriptional errors are unintentional.    Lorayne Marek, MD

## 2015-04-06 LAB — COMPLETE METABOLIC PANEL WITHOUT GFR
ALT: 18 U/L (ref 0–53)
AST: 13 U/L (ref 0–37)
Albumin: 4.5 g/dL (ref 3.5–5.2)
Alkaline Phosphatase: 85 U/L (ref 39–117)
BUN: 21 mg/dL (ref 6–23)
CO2: 24 meq/L (ref 19–32)
Calcium: 10.1 mg/dL (ref 8.4–10.5)
Chloride: 97 meq/L (ref 96–112)
Creat: 1.38 mg/dL — ABNORMAL HIGH (ref 0.50–1.35)
GFR, Est African American: 70 mL/min
GFR, Est Non African American: 60 mL/min
Glucose, Bld: 293 mg/dL — ABNORMAL HIGH (ref 70–99)
Potassium: 5 meq/L (ref 3.5–5.3)
Sodium: 136 meq/L (ref 135–145)
Total Bilirubin: 0.6 mg/dL (ref 0.2–1.2)
Total Protein: 8.2 g/dL (ref 6.0–8.3)

## 2015-04-06 LAB — LIPID PANEL
Cholesterol: 189 mg/dL (ref 0–200)
HDL: 25 mg/dL — AB (ref >=40)
LDL Cholesterol: 99 mg/dL (ref 0–99)
TRIGLYCERIDES: 325 mg/dL — AB (ref <150)
Total CHOL/HDL Ratio: 7.6 Ratio
VLDL: 65 mg/dL — ABNORMAL HIGH (ref 0–40)

## 2015-04-07 ENCOUNTER — Telehealth: Payer: Self-pay

## 2015-04-07 NOTE — Telephone Encounter (Signed)
Patient not available.

## 2015-04-07 NOTE — Telephone Encounter (Signed)
-----   Message from Lorayne Marek, MD sent at 04/06/2015  9:36 AM EDT ----- Blood work reviewed noticed worsening renal function, likely secondary to uncontrolled diabetes, advise patient for diabetes meal planning and start taking insulin which he was prescribed yesterday. Also noticed his triglycerides are elevated, advise patient for low fat diet and continue with statins Lipitor will check fasting lipid panel on the next visit.

## 2015-04-26 ENCOUNTER — Ambulatory Visit: Payer: No Typology Code available for payment source | Admitting: Podiatry

## 2015-04-26 ENCOUNTER — Ambulatory Visit: Payer: Self-pay

## 2015-04-26 ENCOUNTER — Encounter: Payer: Self-pay | Admitting: Podiatry

## 2015-04-26 DIAGNOSIS — R52 Pain, unspecified: Secondary | ICD-10-CM

## 2015-04-26 DIAGNOSIS — E11610 Type 2 diabetes mellitus with diabetic neuropathic arthropathy: Secondary | ICD-10-CM

## 2015-04-26 NOTE — Progress Notes (Signed)
   Subjective:    Patient ID: Raymond Ryan, male    DOB: [birth year], 49 y.o.   MRN: [id number]  HPI N-PAINFUL L-RT FOOT ARCH D-2+ YEARS O-SLOWLY C-WORSE A- WALKING, PRESSURE T-DR. 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 He describes approximately year and a half of immobilization with a cast-like device. More recently last 6-8 months patient is wearing an athletic style shoe with a soft Plastizote insoles. He denies any open wounds in the right foot. He is here for a second opinion about treatment options  Patient denies any history of claudication or foot ulcerations  Review of Systems  Musculoskeletal: Positive for gait problem.  All other systems reviewed and are negative.      Objective:   Physical Exam  Orientated 3  Vascular: DP and PT pulses 2/4 bilaterally Capillary reflex immediate bilaterally No edema noted bilaterally  Neurological: Sensation to 10 g monofilament wire intact 0/5 bilaterally Vibratory sensation nonreactive bilaterally Ankle reflex equal and reactive bilaterally  Dermatological: Texture and turgor within normal limits bilaterally No skin lesions are noted bilaterally The toenails are neatly trimmed with texture and color changes within nail plate 6-10  Musculoskeletal: The right foot demonstrates a low medial longitudinal arch with a rocker bottom shape foot The left foot demonstrates medium longitudinal arch There is restricted range of motion in the midtarsal joint right There is no restriction of motion or crepitus midtarsal joint left There is no restriction or crepitus on range of motion MPJs bilaterally There is no restriction in range of motion or crepitus of ankle joints bilaterally Patient has stable gait  Patient has multilaminated soft Plastizote insole in his athletic style shoes that are in a good state of repair         Assessment & Plan:   Assessment: Satisfactory vascular status Diabetic peripheral neuropathy Stable Charcot's foot right without any history of foot ulceration, pre-ulcerative callus  Plan: At this time because patient has had no open wounds, pre-ulcerative callus I am recommending that he continue wearing a soft accommodative foot orthotic an ongoing continuous basis in athletic style shoe. Replace the accommodative shoe insole on a regular basis.  Reappoint when when necessary or yearly

## 2015-04-26 NOTE — Patient Instructions (Addendum)
Continue to wear your molded insole on the right foot in an athletic style shoe on a continuous and daily basis Replace the insoles as needed or yearly  Diabetes and Foot Care Diabetes may cause you to have problems because of poor blood supply (circulation) to your feet and legs. This may cause the skin on your feet to become thinner, break easier, and heal more slowly. Your skin may become dry, and the skin may peel and crack. You may also have nerve damage in your legs and feet causing decreased feeling in them. You may not notice minor injuries to your feet that could lead to infections or more serious problems. Taking care of your feet is one of the most important things you can do for yourself.  HOME CARE INSTRUCTIONS  Wear shoes at all times, even in the house. Do not go barefoot. Bare feet are easily injured.  Check your feet daily for blisters, cuts, and redness. If you cannot see the bottom of your feet, use a mirror or ask someone for help.  Wash your feet with warm water (do not use hot water) and mild soap. Then pat your feet and the areas between your toes until they are completely dry. Do not soak your feet as this can dry your skin.  Apply a moisturizing lotion or petroleum jelly (that does not contain alcohol and is unscented) to the skin on your feet and to dry, brittle toenails. Do not apply lotion between your toes.  Trim your toenails straight across. Do not dig under them or around the cuticle. File the edges of your nails with an emery board or nail file.  Do not cut corns or calluses or try to remove them with medicine.  Wear clean socks or stockings every day. Make sure they are not too tight. Do not wear knee-high stockings since they may decrease blood flow to your legs.  Wear shoes that fit properly and have enough cushioning. To break in new shoes, wear them for just a few hours a day. This prevents you from injuring your feet. Always look in your shoes before you  put them on to be sure there are no objects inside.  Do not cross your legs. This may decrease the blood flow to your feet.  If you find a minor scrape, cut, or break in the skin on your feet, keep it and the skin around it clean and dry. These areas may be cleansed with mild soap and water. Do not cleanse the area with peroxide, alcohol, or iodine.  When you remove an adhesive bandage, be sure not to damage the skin around it.  If you have a wound, look at it several times a day to make sure it is healing.  Do not use heating pads or hot water bottles. They may burn your skin. If you have lost feeling in your feet or legs, you may not know it is happening until it is too late.  Make sure your health care provider performs a complete foot exam at least annually or more often if you have foot problems. Report any cuts, sores, or bruises to your health care provider immediately. SEEK MEDICAL CARE IF:   You have an injury that is not healing.  You have cuts or breaks in the skin.  You have an ingrown nail.  You notice redness on your legs or feet.  You feel burning or tingling in your legs or feet.  You have pain or cramps  in your legs and feet.  Your legs or feet are numb.  Your feet always feel cold. SEEK IMMEDIATE MEDICAL CARE IF:   There is increasing redness, swelling, or pain in or around a wound.  There is a red line that goes up your leg.  Pus is coming from a wound.  You develop a fever or as directed by your health care provider.  You notice a bad smell coming from an ulcer or wound. Document Released: 12/13/2000 Document Revised: 08/18/2013 Document Reviewed: 05/25/2013 [facility] Patient Information 2015 [place], [place]. This information is not intended to replace advice given to you by your health care provider. Make sure you discuss any questions you have with your health care provider.

## 2015-04-27 ENCOUNTER — Ambulatory Visit: Payer: Self-pay | Attending: Internal Medicine | Admitting: *Deleted

## 2015-04-27 VITALS — BP 107/69 | HR 89 | Temp 98.5°F | Resp 18

## 2015-04-27 DIAGNOSIS — E139 Other specified diabetes mellitus without complications: Secondary | ICD-10-CM

## 2015-04-27 DIAGNOSIS — E1165 Type 2 diabetes mellitus with hyperglycemia: Secondary | ICD-10-CM | POA: Insufficient documentation

## 2015-04-27 LAB — POCT CBG (FASTING - GLUCOSE)-MANUAL ENTRY: Glucose Fasting, POC: 232 mg/dL — AB (ref 70–99)

## 2015-04-27 MED ORDER — INSULIN GLARGINE 100 UNIT/ML SOLOSTAR PEN: 15.0000 [IU] | PEN_INJECTOR | Freq: Every day | SUBCUTANEOUS | Status: DC

## 2015-04-27 NOTE — Patient Instructions (Addendum)
DASH Eating Plan DASH stands for "Dietary Approaches to Stop Hypertension." The DASH eating plan is a healthy eating plan that has been shown to reduce high blood pressure (hypertension). Additional health benefits may include reducing the risk of type 2 diabetes mellitus, heart disease, and stroke. The DASH eating plan may also help with weight loss. WHAT DO I NEED TO KNOW ABOUT THE DASH EATING PLAN? For the DASH eating plan, you will follow these general guidelines:  Choose foods with a percent daily value for sodium of less than 5% (as listed on the food label).  Use salt-free seasonings or herbs instead of table salt or sea salt.  Check with your health care provider or pharmacist before using salt substitutes.  Eat lower-sodium products, often labeled as "lower sodium" or "no salt added."  Eat fresh foods.  Eat more vegetables, fruits, and low-fat dairy products.  Choose whole grains. Look for the word "whole" as the first word in the ingredient list.  Choose fish and skinless chicken or turkey more often than red meat. Limit fish, poultry, and meat to 6 oz (170 g) each day.  Limit sweets, desserts, sugars, and sugary drinks.  Choose heart-healthy fats.  Limit cheese to 1 oz (28 g) per day.  Eat more home-cooked food and less restaurant, buffet, and fast food.  Limit fried foods.  Cook foods using methods other than frying.  Limit canned vegetables. If you do use them, rinse them well to decrease the sodium.  When eating at a restaurant, ask that your food be prepared with less salt, or no salt if possible. WHAT FOODS CAN I EAT? Seek help from a dietitian for individual calorie needs. Grains Whole grain or whole wheat bread. Brown rice. Whole grain or whole wheat pasta. Quinoa, bulgur, and whole grain cereals. Low-sodium cereals. Corn or whole wheat flour tortillas. Whole grain cornbread. Whole grain crackers. Low-sodium crackers. Vegetables Fresh or frozen vegetables  (raw, steamed, roasted, or grilled). Low-sodium or reduced-sodium tomato and vegetable juices. Low-sodium or reduced-sodium tomato sauce and paste. Low-sodium or reduced-sodium canned vegetables.  Fruits All fresh, canned (in natural juice), or frozen fruits. Meat and Other Protein Products Ground beef (85% or leaner), grass-fed beef, or beef trimmed of fat. Skinless chicken or turkey. Ground chicken or turkey. Pork trimmed of fat. All fish and seafood. Eggs. Dried beans, peas, or lentils. Unsalted nuts and seeds. Unsalted canned beans. Dairy Low-fat dairy products, such as skim or 1% milk, 2% or reduced-fat cheeses, low-fat ricotta or cottage cheese, or plain low-fat yogurt. Low-sodium or reduced-sodium cheeses. Fats and Oils Tub margarines without trans fats. Light or reduced-fat mayonnaise and salad dressings (reduced sodium). Avocado. Safflower, olive, or canola oils. Natural peanut or almond butter. Other Unsalted popcorn and pretzels. The items listed above may not be a complete list of recommended foods or beverages. Contact your dietitian for more options. WHAT FOODS ARE NOT RECOMMENDED? Grains White bread. White pasta. White rice. Refined cornbread. Bagels and croissants. Crackers that contain trans fat. Vegetables Creamed or fried vegetables. Vegetables in a cheese sauce. Regular canned vegetables. Regular canned tomato sauce and paste. Regular tomato and vegetable juices. Fruits Dried fruits. Canned fruit in light or heavy syrup. Fruit juice. Meat and Other Protein Products Fatty cuts of meat. Ribs, chicken wings, bacon, sausage, bologna, salami, chitterlings, fatback, hot dogs, bratwurst, and packaged luncheon meats. Salted nuts and seeds. Canned beans with salt. Dairy Whole or 2% milk, cream, half-and-half, and cream cheese. Whole-fat or sweetened yogurt. Full-fat   cheeses or blue cheese. Nondairy creamers and whipped toppings. Processed cheese, cheese spreads, or cheese  curds. Condiments Onion and garlic salt, seasoned salt, table salt, and sea salt. Canned and packaged gravies. Worcestershire sauce. Tartar sauce. Barbecue sauce. Teriyaki sauce. Soy sauce, including reduced sodium. Steak sauce. Fish sauce. Oyster sauce. Cocktail sauce. Horseradish. Ketchup and mustard. Meat flavorings and tenderizers. Bouillon cubes. Hot sauce. Tabasco sauce. Marinades. Taco seasonings. Relishes. Fats and Oils Butter, stick margarine, lard, shortening, ghee, and bacon fat. Coconut, palm kernel, or palm oils. Regular salad dressings. Other Pickles and olives. Salted popcorn and pretzels. The items listed above may not be a complete list of foods and beverages to avoid. Contact your dietitian for more information. WHERE CAN I FIND MORE INFORMATION? National Heart, Lung, and [employer] Institute: www.nhlbi.nih.gov/health/health-topics/topics/dash/ Document Released: 12/05/2011 Document Revised: 05/02/2014 Document Reviewed: 10/20/2013 [employer] Patient Information 2015 [employer]. This information is not intended to replace advice given to you by your health care provider. Make sure you discuss any questions you have with your health care provider. Diabetes Mellitus and Food It is important for you to manage your blood sugar (glucose) level. Your blood glucose level can be greatly affected by what you eat. Eating healthier foods in the appropriate amounts throughout the day at about the same time each day will help you control your blood glucose level. It can also help slow or prevent worsening of your diabetes mellitus. Healthy eating may even help you improve the level of your blood pressure and reach or maintain a healthy weight.  HOW CAN FOOD AFFECT ME? Carbohydrates Carbohydrates affect your blood glucose level more than any other type of food. Your dietitian will help you determine how many carbohydrates to eat at each meal and teach you how to count carbohydrates. Counting  carbohydrates is important to keep your blood glucose at a healthy level, especially if you are using insulin or taking certain medicines for diabetes mellitus. Alcohol Alcohol can cause sudden decreases in blood glucose (hypoglycemia), especially if you use insulin or take certain medicines for diabetes mellitus. Hypoglycemia can be a life-threatening condition. Symptoms of hypoglycemia (sleepiness, dizziness, and disorientation) are similar to symptoms of having too much alcohol.  If your health care provider has given you approval to drink alcohol, do so in moderation and use the following guidelines:  Women should not have more than one drink per day, and men should not have more than two drinks per day. One drink is equal to:  12 oz of beer.  5 oz of wine.  1 oz of hard liquor.  Do not drink on an empty stomach.  Keep yourself hydrated. Have water, diet soda, or unsweetened iced tea.  Regular soda, juice, and other mixers might contain a lot of carbohydrates and should be counted. WHAT FOODS ARE NOT RECOMMENDED? As you make food choices, it is important to remember that all foods are not the same. Some foods have fewer nutrients per serving than other foods, even though they might have the same number of calories or carbohydrates. It is difficult to get your body what it needs when you eat foods with fewer nutrients. Examples of foods that you should avoid that are high in calories and carbohydrates but low in nutrients include:  Trans fats (most processed foods list trans fats on the Nutrition Facts label).  Regular soda.  Juice.  Candy.  Sweets, such as cake, pie, doughnuts, and cookies.  Fried foods. WHAT FOODS CAN I EAT? Have nutrient-rich foods,   which will nourish your body and keep you healthy. The food you should eat also will depend on several factors, including:  The calories you need.  The medicines you take.  Your weight.  Your blood glucose level.  Your  blood pressure level.  Your cholesterol level. You also should eat a variety of foods, including:  Protein, such as meat, poultry, fish, tofu, nuts, and seeds (lean animal proteins are best).  Fruits.  Vegetables.  Dairy products, such as milk, cheese, and yogurt (low fat is best).  Breads, grains, pasta, cereal, rice, and beans.  Fats such as olive oil, trans fat-free margarine, canola oil, avocado, and olives. DOES EVERYONE WITH DIABETES MELLITUS HAVE THE SAME MEAL PLAN? Because every person with diabetes mellitus is different, there is not one meal plan that works for everyone. It is very important that you meet with a dietitian who will help you create a meal plan that is just right for you. Document Released: 09/12/2005 Document Revised: 12/21/2013 Document Reviewed: 11/12/2013 [facility] Patient Information 2015 [place], [place]. This information is not intended to replace advice given to you by your health care provider. Make sure you discuss any questions you have with your health care provider. Diabetes and Foot Care Diabetes may cause you to have problems because of poor blood supply (circulation) to your feet and legs. This may cause the skin on your feet to become thinner, break easier, and heal more slowly. Your skin may become dry, and the skin may peel and crack. You may also have nerve damage in your legs and feet causing decreased feeling in them. You may not notice minor injuries to your feet that could lead to infections or more serious problems. Taking care of your feet is one of the most important things you can do for yourself.  HOME CARE INSTRUCTIONS  Wear shoes at all times, even in the house. Do not go barefoot. Bare feet are easily injured.  Check your feet daily for blisters, cuts, and redness. If you cannot see the bottom of your feet, use a mirror or ask someone for help.  Wash your feet with warm water (do not use hot water) and mild soap. Then pat your feet and  the areas between your toes until they are completely dry. Do not soak your feet as this can dry your skin.  Apply a moisturizing lotion or petroleum jelly (that does not contain alcohol and is unscented) to the skin on your feet and to dry, brittle toenails. Do not apply lotion between your toes.  Trim your toenails straight across. Do not dig under them or around the cuticle. File the edges of your nails with an emery board or nail file.  Do not cut corns or calluses or try to remove them with medicine.  Wear clean socks or stockings every day. Make sure they are not too tight. Do not wear knee-high stockings since they may decrease blood flow to your legs.  Wear shoes that fit properly and have enough cushioning. To break in new shoes, wear them for just a few hours a day. This prevents you from injuring your feet. Always look in your shoes before you put them on to be sure there are no objects inside.  Do not cross your legs. This may decrease the blood flow to your feet.  If you find a minor scrape, cut, or break in the skin on your feet, keep it and the skin around it clean and dry. These areas  may be cleansed with mild soap and water. Do not cleanse the area with peroxide, alcohol, or iodine.  When you remove an adhesive bandage, be sure not to damage the skin around it.  If you have a wound, look at it several times a day to make sure it is healing.  Do not use heating pads or hot water bottles. They may burn your skin. If you have lost feeling in your feet or legs, you may not know it is happening until it is too late.  Make sure your health care provider performs a complete foot exam at least annually or more often if you have foot problems. Report any cuts, sores, or bruises to your health care provider immediately. SEEK MEDICAL CARE IF:   You have an injury that is not healing.  You have cuts or breaks in the skin.  You have an ingrown nail.  You notice redness on your legs  or feet.  You feel burning or tingling in your legs or feet.  You have pain or cramps in your legs and feet.  Your legs or feet are numb.  Your feet always feel cold. SEEK IMMEDIATE MEDICAL CARE IF:   There is increasing redness, swelling, or pain in or around a wound.  There is a red line that goes up your leg.  Pus is coming from a wound.  You develop a fever or as directed by your health care provider.  You notice a bad smell coming from an ulcer or wound. Document Released: 12/13/2000 Document Revised: 08/18/2013 Document Reviewed: 05/25/2013 [facility] Patient Information 2015 [place], [place]. This information is not intended to replace advice given to you by your health care provider. Make sure you discuss any questions you have with your health care provider. Diabetes and Exercise Exercising regularly is important. It is not just about losing weight. It has many health benefits, such as:  Improving your overall fitness, flexibility, and endurance.  Increasing your bone density.  Helping with weight control.  Decreasing your body fat.  Increasing your muscle strength.  Reducing stress and tension.  Improving your overall health. People with diabetes who exercise gain additional benefits because exercise:  Reduces appetite.  Improves the body's use of blood sugar (glucose).  Helps lower or control blood glucose.  Decreases blood pressure.  Helps control blood lipids (such as cholesterol and triglycerides).  Improves the body's use of the hormone insulin by:  Increasing the body's insulin sensitivity.  Reducing the body's insulin needs.  Decreases the risk for heart disease because exercising:  Lowers cholesterol and triglycerides levels.  Increases the levels of good cholesterol (such as high-density lipoproteins [HDL]) in the body.  Lowers blood glucose levels. YOUR ACTIVITY PLAN  Choose an activity that you enjoy and set realistic goals. Your  health care provider or diabetes educator can help you make an activity plan that works for you. Exercise regularly as directed by your health care provider. This includes:  Performing resistance training twice a week such as push-ups, sit-ups, lifting weights, or using resistance bands.  Performing 150 minutes of cardio exercises each week such as walking, running, or playing sports.  Staying active and spending no more than 90 minutes at one time being inactive. Even short bursts of exercise are good for you. Three 10-minute sessions spread throughout the day are just as beneficial as a single 30-minute session. Some exercise ideas include:  Taking the dog for a walk.  Taking the stairs instead of the elevator.  Dancing to your favorite song.  Doing an exercise video.  Doing your favorite exercise with a friend. RECOMMENDATIONS FOR EXERCISING WITH TYPE 1 OR TYPE 2 DIABETES   Check your blood glucose before exercising. If blood glucose levels are greater than 240 mg/dL, check for urine ketones. Do not exercise if ketones are present.  Avoid injecting insulin into areas of the body that are going to be exercised. For example, avoid injecting insulin into:  The arms when playing tennis.  The legs when jogging.  Keep a record of:  Food intake before and after you exercise.  Expected peak times of insulin action.  Blood glucose levels before and after you exercise.  The type and amount of exercise you have done.  Review your records with your health care provider. Your health care provider will help you to develop guidelines for adjusting food intake and insulin amounts before and after exercising.  If you take insulin or oral hypoglycemic agents, watch for signs and symptoms of hypoglycemia. They include:  Dizziness.  Shaking.  Sweating.  Chills.  Confusion.  Drink plenty of water while you exercise to prevent dehydration or heat stroke. Body water is lost during  exercise and must be replaced.  Talk to your health care provider before starting an exercise program to make sure it is safe for you. Remember, almost any type of activity is better than none. Document Released: 03/07/2004 Document Revised: 05/02/2014 Document Reviewed: 05/25/2013 [facility] Patient Information 2015 [place], [place]. This information is not intended to replace advice given to you by your health care provider. Make sure you discuss any questions you have with your health care provider. Basic Carbohydrate Counting for Diabetes Mellitus Carbohydrate counting is a method for keeping track of the amount of carbohydrates you eat. Eating carbohydrates naturally increases the level of sugar (glucose) in your blood, so it is important for you to know the amount that is okay for you to have in every meal. Carbohydrate counting helps keep the level of glucose in your blood within normal limits. The amount of carbohydrates allowed is different for every person. A dietitian can help you calculate the amount that is right for you. Once you know the amount of carbohydrates you can have, you can count the carbohydrates in the foods you want to eat. Carbohydrates are found in the following foods:  Grains, such as breads and cereals.  Dried beans and soy products.  Starchy vegetables, such as potatoes, peas, and corn.  Fruit and fruit juices.  Milk and yogurt.  Sweets and snack foods, such as cake, cookies, candy, chips, soft drinks, and fruit drinks. CARBOHYDRATE COUNTING There are two ways to count the carbohydrates in your food. You can use either of the methods or a combination of both. Reading the "Nutrition Facts" on [place] The "Nutrition Facts" is an area that is included on the labels of almost all packaged food and beverages in the [place]. It includes the serving size of that food or beverage and information about the nutrients in each serving of the food, including the  grams (g) of carbohydrate per serving.  Decide the number of servings of this food or beverage that you will be able to eat or drink. Multiply that number of servings by the number of grams of carbohydrate that is listed on the label for that serving. The total will be the amount of carbohydrates you will be having when you eat or drink this food or beverage. Learning Standard Serving  Sizes of Food When you eat food that is not packaged or does not include "Nutrition Facts" on the label, you need to measure the servings in order to count the amount of carbohydrates.A serving of most carbohydrate-rich foods contains about 15 g of carbohydrates. The following list includes serving sizes of carbohydrate-rich foods that provide 15 g ofcarbohydrate per serving:   1 slice of bread (1 oz) or 1 six-inch tortilla.    of a hamburger bun or English muffin.  4-6 crackers.   cup unsweetened dry cereal.    cup hot cereal.   cup rice or pasta.    cup mashed potatoes or  of a large baked potato.  1 cup fresh fruit or one small piece of fruit.    cup canned or frozen fruit or fruit juice.  1 cup milk.   cup plain fat-free yogurt or yogurt sweetened with artificial sweeteners.   cup cooked dried beans or starchy vegetable, such as peas, corn, or potatoes.  Decide the number of standard-size servings that you will eat. Multiply that number of servings by 15 (the grams of carbohydrates in that serving). For example, if you eat 2 cups of strawberries, you will have eaten 2 servings and 30 g of carbohydrates (2 servings x 15 g = 30 g). For foods such as soups and casseroles, in which more than one food is mixed in, you will need to count the carbohydrates in each food that is included. EXAMPLE OF CARBOHYDRATE COUNTING Sample Dinner  3 oz chicken breast.   cup of brown rice.   cup of corn.  1 cup milk.   1 cup strawberries with sugar-free whipped topping.  Carbohydrate  Calculation Step 1: Identify the foods that contain carbohydrates:   Rice.   Corn.   Milk.   Strawberries. Step 2:Calculate the number of servings eaten of each:   2 servings of rice.   1 serving of corn.   1 serving of milk.   1 serving of strawberries. Step 3: Multiply each of those number of servings by 15 g:   2 servings of rice x 15 g = 30 g.   1 serving of corn x 15 g = 15 g.   1 serving of milk x 15 g = 15 g.   1 serving of strawberries x 15 g = 15 g. Step 4: Add together all of the amounts to find the total grams of carbohydrates eaten: 30 g + 15 g + 15 g + 15 g = 75 g. Document Released: 12/16/2005 Document Revised: 05/02/2014 Document Reviewed: 11/12/2013 [facility] Patient Information 2015 [place], [place]. This information is not intended to replace advice given to you by your health care provider. Make sure you discuss any questions you have with your health care provider. Fat and Cholesterol Control Diet Fat and cholesterol levels in your blood and organs are influenced by your diet. High levels of fat and cholesterol may lead to diseases of the heart, small and large blood vessels, gallbladder, liver, and pancreas. CONTROLLING FAT AND CHOLESTEROL WITH DIET Although exercise and lifestyle factors are important, your diet is key. That is because certain foods are known to raise cholesterol and others to lower it. The goal is to balance foods for their effect on cholesterol and more importantly, to replace saturated and trans fat with other types of fat, such as monounsaturated fat, polyunsaturated fat, and omega-3 fatty acids. On average, a person should consume no more than 15 to 17 g of  saturated fat daily. Saturated and trans fats are considered "bad" fats, and they will raise LDL cholesterol. Saturated fats are primarily found in animal products such as meats, butter, and cream. However, that does not mean you need to give up all your favorite foods.  Today, there are good tasting, low-fat, low-cholesterol substitutes for most of the things you like to eat. Choose low-fat or nonfat alternatives. Choose round or loin cuts of red meat. These types of cuts are lowest in fat and cholesterol. Chicken (without the skin), fish, veal, and ground [place] breast are great choices. Eliminate fatty meats, such as hot dogs and salami. Even shellfish have little or no saturated fat. Have a 3 oz (85 g) portion when you eat lean meat, poultry, or fish. Trans fats are also called "partially hydrogenated oils." They are oils that have been scientifically manipulated so that they are solid at room temperature resulting in a longer shelf life and improved taste and texture of foods in which they are added. Trans fats are found in stick margarine, some tub margarines, cookies, crackers, and baked goods.  When baking and cooking, oils are a great substitute for butter. The monounsaturated oils are especially beneficial since it is believed they lower LDL and raise HDL. The oils you should avoid entirely are saturated tropical oils, such as coconut and palm.  Remember to eat a lot from food groups that are naturally free of saturated and trans fat, including fish, fruit, vegetables, beans, grains (barley, rice, couscous, bulgur wheat), and pasta (without cream sauces).  IDENTIFYING FOODS THAT LOWER FAT AND CHOLESTEROL  Soluble fiber may lower your cholesterol. This type of fiber is found in fruits such as apples, vegetables such as broccoli, potatoes, and carrots, legumes such as beans, peas, and lentils, and grains such as barley. Foods fortified with plant sterols (phytosterol) may also lower cholesterol. You should eat at least 2 g per day of these foods for a cholesterol lowering effect.  Read package labels to identify low-saturated fats, trans fat free, and low-fat foods at the supermarket. Select cheeses that have only 2 to 3 g saturated fat per ounce. Use a heart-healthy  tub margarine that is free of trans fats or partially hydrogenated oil. When buying baked goods (cookies, crackers), avoid partially hydrogenated oils. Breads and muffins should be made from whole grains (whole-wheat or whole oat flour, instead of "flour" or "enriched flour"). Buy non-creamy canned soups with reduced salt and no added fats.  FOOD PREPARATION TECHNIQUES  Never deep-fry. If you must fry, either stir-fry, which uses very little fat, or use non-stick cooking sprays. When possible, broil, bake, or roast meats, and steam vegetables. Instead of putting butter or margarine on vegetables, use lemon and herbs, applesauce, and cinnamon (for squash and sweet potatoes). Use nonfat yogurt, salsa, and low-fat dressings for salads.  LOW-SATURATED FAT / LOW-FAT FOOD SUBSTITUTES Meats / Saturated Fat (g)  Avoid: Steak, marbled (3 oz/85 g) / 11 g  Choose: Steak, lean (3 oz/85 g) / 4 g  Avoid: Hamburger (3 oz/85 g) / 7 g  Choose: Hamburger, lean (3 oz/85 g) / 5 g  Avoid: Ham (3 oz/85 g) / 6 g  Choose: Ham, lean cut (3 oz/85 g) / 2.4 g  Avoid: Chicken, with skin, dark meat (3 oz/85 g) / 4 g  Choose: Chicken, skin removed, dark meat (3 oz/85 g) / 2 g  Avoid: Chicken, with skin, light meat (3 oz/85 g) / 2.5 g  Choose: Chicken, skin  removed, light meat (3 oz/85 g) / 1 g Dairy / Saturated Fat (g)  Avoid: Whole milk (1 cup) / 5 g  Choose: Low-fat milk, 2% (1 cup) / 3 g  Choose: Low-fat milk, 1% (1 cup) / 1.5 g  Choose: Skim milk (1 cup) / 0.3 g  Avoid: Hard cheese (1 oz/28 g) / 6 g  Choose: Skim milk cheese (1 oz/28 g) / 2 to 3 g  Avoid: Cottage cheese, 4% fat (1 cup) / 6.5 g  Choose: Low-fat cottage cheese, 1% fat (1 cup) / 1.5 g  Avoid: Ice cream (1 cup) / 9 g  Choose: Sherbet (1 cup) / 2.5 g  Choose: Nonfat frozen yogurt (1 cup) / 0.3 g  Choose: Frozen fruit bar / trace  Avoid: Whipped cream (1 tbs) / 3.5 g  Choose: Nondairy whipped topping (1 tbs) / 1 g Condiments /  Saturated Fat (g)  Avoid: Mayonnaise (1 tbs) / 2 g  Choose: Low-fat mayonnaise (1 tbs) / 1 g  Avoid: Butter (1 tbs) / 7 g  Choose: Extra light margarine (1 tbs) / 1 g  Avoid: Coconut oil (1 tbs) / 11.8 g  Choose: Olive oil (1 tbs) / 1.8 g  Choose: Corn oil (1 tbs) / 1.7 g  Choose: Safflower oil (1 tbs) / 1.2 g  Choose: Sunflower oil (1 tbs) / 1.4 g  Choose: Soybean oil (1 tbs) / 2.4 g  Choose: Canola oil (1 tbs) / 1 g Document Released: 12/16/2005 Document Revised: 04/12/2013 Document Reviewed: 03/16/2014 ExitCare Patient Information 2015 [place], [place]. This information is not intended to replace advice given to you by your health care provider. Make sure you discuss any questions you have with your health care provider.

## 2015-04-27 NOTE — Progress Notes (Signed)
Patient presents for BP check, CBG and record review for T2DM Med list reviewed; states taking all meds as directed Patient is not adding salt to foods or cooking with salt. Patient made aware of Mrs Deliah Boston as alternative to salt. Encouraged patient to choose foods with 5% or less of daily value for sodium. Discussed walking 30 minutes per day for exercise, however, patient states he is non-weight bearing on right foot due to T2DM Patient denies headaches, blurred vision, SHOB, chest pain  Patient's AM fasting blood sugars ranging 129-190 Labs from last OV reviewed with patient  CBG 232 AM Fasting   Lab Results  Component Value Date   HGBA1C 14.0 04/05/2015    Filed Vitals:   04/27/15 0957  BP: 107/69  Pulse: 89  Temp: 98.5 F (36.9 C)  Resp: 18    Per PCP: Increase lantus to 17 units at 10 pm  Patient given blood sugar log and instructed on use. Instructed to bring to all future visits.  Patient to return in 2 weeks for nurse visit for CBG and record review  Patient advised to call for med refills at least 7 days before running out so as not to go without.  Patient given literature on DASH Eating Plan, Fat and Cholesterol Control Diet, Diabetes and Food, Diabetes and Exercise, Basic Carb Counting, Diabetes and Foot Care, and The Plate Method

## 2015-05-12 ENCOUNTER — Ambulatory Visit: Payer: Self-pay | Attending: Internal Medicine | Admitting: *Deleted

## 2015-05-12 VITALS — BP 113/68 | HR 87 | Temp 98.3°F | Resp 16

## 2015-05-12 DIAGNOSIS — E1165 Type 2 diabetes mellitus with hyperglycemia: Secondary | ICD-10-CM

## 2015-05-12 DIAGNOSIS — Z794 Long term (current) use of insulin: Secondary | ICD-10-CM | POA: Insufficient documentation

## 2015-05-12 DIAGNOSIS — E139 Other specified diabetes mellitus without complications: Secondary | ICD-10-CM

## 2015-05-12 LAB — POCT CBG (FASTING - GLUCOSE)-MANUAL ENTRY: GLUCOSE FASTING, POC: 235 mg/dL — AB (ref 70–99)

## 2015-05-12 MED ORDER — INSULIN GLARGINE 100 UNIT/ML SOLOSTAR PEN: 20.0000 [IU] | PEN_INJECTOR | Freq: Every day | SUBCUTANEOUS | Status: DC

## 2015-05-12 NOTE — Progress Notes (Signed)
Patient presents for BP check, CBG and record review for T2DM Med list reviewed; patient reports taking all meds as directed except has been injecting 18 units lantus insulin instead of 17 ordered Patient's AM fasting blood sugars ranging 236-262 Patient's before dinner blood sugars ranging 236-287 Denies increased thirst and urination Patient is not adding salt to foods or cooking with salt.  States since mother's day he has felt depressed and has been overeating; hasn't checked BS in 7 days. Offered appt with LCSW for counseling; patient refused. Also stated he does not want to be on medication for depression. PCP made aware Patient states he knows what to do (food choices) going forward and will try  CBG 235 AM fasting  Lab Results  Component Value Date   HGBA1C 14.0 04/05/2015   Filed Vitals:   05/12/15 1021  BP: 113/68  Pulse: 87  Temp: 98.3 F (36.8 C)  Resp: 16     Per PCP: Increase lantus insulin to 20 units q HS  Patient advised to call for med refills at least 7 days before running out so as not to go without.  Patient to return in 2-4 weeks (based on dietary compliance) for nurse visit for CBG and record review

## 2015-06-06 ENCOUNTER — Ambulatory Visit: Payer: Self-pay | Attending: Internal Medicine

## 2015-06-14 ENCOUNTER — Other Ambulatory Visit: Payer: Self-pay

## 2015-06-14 DIAGNOSIS — E139 Other specified diabetes mellitus without complications: Secondary | ICD-10-CM

## 2015-06-14 MED ORDER — INSULIN GLARGINE 100 UNIT/ML SOLOSTAR PEN: 20.0000 [IU] | PEN_INJECTOR | Freq: Every day | SUBCUTANEOUS | Status: DC

## 2015-07-07 ENCOUNTER — Other Ambulatory Visit: Payer: Self-pay | Admitting: Pharmacist

## 2015-07-07 MED ORDER — INSULIN GLARGINE 300 UNIT/ML ~~LOC~~ SOPN: 20.0000 [IU] | PEN_INJECTOR | Freq: Every day | SUBCUTANEOUS | Status: DC

## 2015-07-12 ENCOUNTER — Ambulatory Visit: Payer: Self-pay | Attending: Family Medicine | Admitting: Family Medicine

## 2015-07-12 ENCOUNTER — Encounter: Payer: Self-pay | Admitting: Family Medicine

## 2015-07-12 VITALS — BP 154/84 | HR 92 | Temp 97.8°F | Resp 16 | Wt 278.6 lb

## 2015-07-12 DIAGNOSIS — S90221A Contusion of right lesser toe(s) with damage to nail, initial encounter: Secondary | ICD-10-CM | POA: Insufficient documentation

## 2015-07-12 DIAGNOSIS — I10 Essential (primary) hypertension: Secondary | ICD-10-CM | POA: Insufficient documentation

## 2015-07-12 DIAGNOSIS — T7840XA Allergy, unspecified, initial encounter: Secondary | ICD-10-CM

## 2015-07-12 DIAGNOSIS — IMO0002 Reserved for concepts with insufficient information to code with codable children: Secondary | ICD-10-CM

## 2015-07-12 DIAGNOSIS — E1165 Type 2 diabetes mellitus with hyperglycemia: Secondary | ICD-10-CM | POA: Insufficient documentation

## 2015-07-12 DIAGNOSIS — Z889 Allergy status to unspecified drugs, medicaments and biological substances status: Secondary | ICD-10-CM | POA: Insufficient documentation

## 2015-07-12 LAB — GLUCOSE, POCT (MANUAL RESULT ENTRY): POC GLUCOSE: 227 mg/dL — AB (ref 70–99)

## 2015-07-12 LAB — POCT GLYCOSYLATED HEMOGLOBIN (HGB A1C)

## 2015-07-12 MED ORDER — LIRAGLUTIDE 18 MG/3ML ~~LOC~~ SOPN: PEN_INJECTOR | SUBCUTANEOUS | Status: DC

## 2015-07-12 MED ORDER — CEPHALEXIN 500 MG PO CAPS: 500.0000 mg | ORAL_CAPSULE | Freq: Four times a day (QID) | ORAL | Status: DC

## 2015-07-12 NOTE — Progress Notes (Addendum)
Subjective:    Patient ID: Raymond Ryan, male    DOB: 20-Jun-1966, 49 y.o.   MRN: [id number]  HPI  Raymond Ryan is a 49 year old male patient with a history of type II uncontrolled diabetes mellitus(hemoglobin A1c of 14. from 03/2015), peripheral neuropathy, hypertension who presents today for complaining of pruritus secondary to to [facility]; he endorses the fact that he also had pruritus with doing Lantus which he took prior to [facility] (denies anaphylaxis or swelling of lip, dyspnea) and expresses that he would like to get off insulin at this time.Review of his chart indicates he is ready and glipizide and metformin; he does admit to not being compliant with ADA diet.  Bumped his big toe 2 days ago and noticed it was bleeding and now has a foul odor; he applied a dressing to it. Has not used any OTC creams or medications.  Past Medical History  Diagnosis Date  . Diabetes mellitus   . Hypertension     No past surgical history on file.  History   Social History  . Marital Status: Single    Spouse Name: N/A  . Number of Children: N/A  . Years of Education: N/A   Occupational History  . Not on file.   Social History Main Topics  . Smoking status: Never Smoker   . Smokeless tobacco: Not on file  . Alcohol Use: No  . Drug Use: No  . Sexual Activity: Not on file   Other Topics Concern  . Not on file   Social History Narrative    Raymond Ryan does not currently have medications on file.  No Known Allergies  Current Outpatient Prescriptions on File Prior to Visit  Medication Sig Dispense Refill  . atorvastatin (LIPITOR) 20 MG tablet Take 1 tablet (20 mg total) by mouth daily. 90 tablet 3  . gabapentin (NEURONTIN) 300 MG capsule Take 1 capsule (300 mg total) by mouth at bedtime as needed. 30 capsule 3  . glipiZIDE (GLUCOTROL) 10 MG tablet Take 2 tablets (20 mg total) by mouth 2 (two) times daily before a meal. 120 tablet 3  . Insulin Glargine (LANTUS) 100 UNIT/ML  Solostar Pen Inject 20 Units into the skin daily at 10 pm. 30 mL 3  . Insulin Glargine (TOUJEO SOLOSTAR) 300 UNIT/ML SOPN Inject 20 Units into the skin daily at 10 pm. 1 pen 0  . lisinopril-hydrochlorothiazide (PRINZIDE,ZESTORETIC) 20-12.5 MG per tablet Take 1 tablet by mouth daily. 30 tablet 3  . magnesium hydroxide (MILK OF MAGNESIA) 400 MG/5ML suspension Take 15 mLs by mouth daily as needed for mild constipation. 360 mL 0  . metFORMIN (GLUCOPHAGE XR) 500 MG 24 hr tablet Take 2 tablets (1,000 mg total) by mouth 2 (two) times daily with a meal. 120 tablet 4   No current facility-administered medications on file prior to visit.        Review of Systems  Constitutional: Negative for activity change and appetite change.  Respiratory: Negative for chest tightness and shortness of breath.   Cardiovascular: Negative for chest pain and palpitations.  Gastrointestinal: Negative for abdominal pain and abdominal distention.  Endocrine: Negative for cold intolerance, heat intolerance and polyphagia.  Genitourinary: Negative for dysuria, frequency and difficulty urinating.  Musculoskeletal:       See hpi  Neurological: Negative for dizziness, tremors and weakness.  Psychiatric/Behavioral: Negative for suicidal ideas and behavioral problems.         Objective: Filed Vitals:   07/12/15 0914  BP: 154/84  Pulse: 92  Temp: 97.8 F (36.6 C)  Resp: 16  Weight: 278 lb 9.6 oz (126.372 kg)  SpO2: 99%      Physical Exam  Constitutional: He is oriented to person, place, and time. He appears well-developed and well-nourished.  HENT:  Head: Atraumatic.  Neck: Normal range of motion. Neck supple. No tracheal deviation present.  Cardiovascular: Normal rate, regular rhythm and normal heart sounds.   No murmur heard. Pulmonary/Chest: Effort normal and breath sounds normal. No respiratory distress. He has no wheezes. He exhibits no tenderness.  Abdominal: Soft. Bowel sounds are normal. He exhibits  no mass. There is no tenderness.  Musculoskeletal:  Right big toe with ulcer at the nail cuticle, foul smelly discharge and tender to palpation. Onychomycosis of right toenail. Left foot is normal.  Neurological: He is alert and oriented to person, place, and time.  Psychiatric: He has a normal mood and affect.            Assessment & Plan:  49 year old male patient with history of uncontrolled type 2 diabetes mellitus, hypertension, presenting with allergic reaction to insulin as well as recent history of trauma to the right foot.  Right big toe trauma: Evidence of beginning infection. Placed on Keflex and dressing change performed in the office. He will need follow-up evaluation at his office visit in 2 weeks with his PCP. 10 precautions discussed.  Type 2 diabetes mellitus: Uncontrolled with A1c of 11.9 which is down from 14 three months ago Discontinue Lantus and Toujeo due to pruritus. Placed on Victoza which she will receive via the patient assistance program from the pharmacy on site any side effects have been discussed with the patient as well as moderate administration.  Questionable Drug reaction: Discontinue Lantus and Toujeo  Hypertension Blood pressure is above goal of less than 140/90 Lifestyle changes advised.  This note has been created with Surveyor, quantity. Any transcriptional errors are unintentional.

## 2015-07-12 NOTE — Patient Instructions (Signed)
Diabetes Mellitus and Food It is important for you to manage your blood sugar (glucose) level. Your blood glucose level can be greatly affected by what you eat. Eating healthier foods in the appropriate amounts throughout the day at about the same time each day will help you control your blood glucose level. It can also help slow or prevent worsening of your diabetes mellitus. Healthy eating may even help you improve the level of your blood pressure and reach or maintain a healthy weight.  HOW CAN FOOD AFFECT ME? Carbohydrates Carbohydrates affect your blood glucose level more than any other type of food. Your dietitian will help you determine how many carbohydrates to eat at each meal and teach you how to count carbohydrates. Counting carbohydrates is important to keep your blood glucose at a healthy level, especially if you are using insulin or taking certain medicines for diabetes mellitus. Alcohol Alcohol can cause sudden decreases in blood glucose (hypoglycemia), especially if you use insulin or take certain medicines for diabetes mellitus. Hypoglycemia can be a life-threatening condition. Symptoms of hypoglycemia (sleepiness, dizziness, and disorientation) are similar to symptoms of having too much alcohol.  If your health care provider has given you approval to drink alcohol, do so in moderation and use the following guidelines:  Women should not have more than one drink per day, and men should not have more than two drinks per day. One drink is equal to:  12 oz of beer.  5 oz of wine.  1 oz of hard liquor.  Do not drink on an empty stomach.  Keep yourself hydrated. Have water, diet soda, or unsweetened iced tea.  Regular soda, juice, and other mixers might contain a lot of carbohydrates and should be counted. WHAT FOODS ARE NOT RECOMMENDED? As you make food choices, it is important to remember that all foods are not the same. Some foods have fewer nutrients per serving than other  foods, even though they might have the same number of calories or carbohydrates. It is difficult to get your body what it needs when you eat foods with fewer nutrients. Examples of foods that you should avoid that are high in calories and carbohydrates but low in nutrients include:  Trans fats (most processed foods list trans fats on the Nutrition Facts label).  Regular soda.  Juice.  Candy.  Sweets, such as cake, pie, doughnuts, and cookies.  Fried foods. WHAT FOODS CAN I EAT? Have nutrient-rich foods, which will nourish your body and keep you healthy. The food you should eat also will depend on several factors, including:  The calories you need.  The medicines you take.  Your weight.  Your blood glucose level.  Your blood pressure level.  Your cholesterol level. You also should eat a variety of foods, including:  Protein, such as meat, poultry, fish, tofu, nuts, and seeds (lean animal proteins are best).  Fruits.  Vegetables.  Dairy products, such as milk, cheese, and yogurt (low fat is best).  Breads, grains, pasta, cereal, rice, and beans.  Fats such as olive oil, trans fat-free margarine, canola oil, avocado, and olives. DOES EVERYONE WITH DIABETES MELLITUS HAVE THE SAME MEAL PLAN? Because every person with diabetes mellitus is different, there is not one meal plan that works for everyone. It is very important that you meet with a dietitian who will help you create a meal plan that is just right for you. Document Released: 09/12/2005 Document Revised: 12/21/2013 Document Reviewed: 11/12/2013 ExitCare Patient Information 2015 [employer]. This   information is not intended to replace advice given to you by your health care provider. Make sure you discuss any questions you have with your health care provider.  

## 2015-07-12 NOTE — Progress Notes (Signed)
Pt present today to check his right big toe. He states he hit it a couple days ago. He current CBG is 227.

## 2015-07-13 NOTE — Progress Notes (Signed)
Quick Note:  Labs addressed at office as well as medications and patient was made aware. ______ 

## 2015-07-24 ENCOUNTER — Ambulatory Visit: Payer: Self-pay | Attending: Internal Medicine | Admitting: Internal Medicine

## 2015-07-24 ENCOUNTER — Encounter: Payer: Self-pay | Admitting: Internal Medicine

## 2015-07-24 VITALS — BP 120/80 | HR 97 | Temp 98.0°F | Resp 16 | Wt 278.0 lb

## 2015-07-24 DIAGNOSIS — I10 Essential (primary) hypertension: Secondary | ICD-10-CM | POA: Insufficient documentation

## 2015-07-24 DIAGNOSIS — Z79899 Other long term (current) drug therapy: Secondary | ICD-10-CM | POA: Insufficient documentation

## 2015-07-24 DIAGNOSIS — E785 Hyperlipidemia, unspecified: Secondary | ICD-10-CM | POA: Insufficient documentation

## 2015-07-24 DIAGNOSIS — L299 Pruritus, unspecified: Secondary | ICD-10-CM

## 2015-07-24 DIAGNOSIS — E119 Type 2 diabetes mellitus without complications: Secondary | ICD-10-CM | POA: Insufficient documentation

## 2015-07-24 DIAGNOSIS — E139 Other specified diabetes mellitus without complications: Secondary | ICD-10-CM

## 2015-07-24 LAB — COMPLETE METABOLIC PANEL WITH GFR
ALT: 17 U/L (ref 9–46)
AST: 15 U/L (ref 10–40)
Albumin: 4.8 g/dL (ref 3.6–5.1)
Alkaline Phosphatase: 80 U/L (ref 40–115)
BUN: 24 mg/dL (ref 7–25)
CALCIUM: 10.3 mg/dL (ref 8.6–10.3)
CHLORIDE: 101 mmol/L (ref 98–110)
CO2: 26 mmol/L (ref 20–31)
Creat: 1.53 mg/dL — ABNORMAL HIGH (ref 0.60–1.35)
GFR, EST AFRICAN AMERICAN: 62 mL/min (ref >=60)
GFR, Est Non African American: 53 mL/min — ABNORMAL LOW (ref >=60)
Glucose, Bld: 207 mg/dL — ABNORMAL HIGH (ref 65–99)
POTASSIUM: 5.2 mmol/L (ref 3.5–5.3)
SODIUM: 140 mmol/L (ref 135–146)
TOTAL PROTEIN: 8 g/dL (ref 6.1–8.1)
Total Bilirubin: 0.5 mg/dL (ref 0.2–1.2)

## 2015-07-24 LAB — LIPID PANEL
CHOLESTEROL: 154 mg/dL (ref 125–200)
HDL: 29 mg/dL — ABNORMAL LOW (ref >=40)
LDL CALC: 88 mg/dL (ref <130)
Total CHOL/HDL Ratio: 5.3 Ratio — ABNORMAL HIGH (ref <=5.0)
Triglycerides: 185 mg/dL — ABNORMAL HIGH (ref <150)
VLDL: 37 mg/dL — ABNORMAL HIGH (ref <30)

## 2015-07-24 LAB — GLUCOSE, POCT (MANUAL RESULT ENTRY): POC GLUCOSE: 192 mg/dL — AB (ref 70–99)

## 2015-07-24 MED ORDER — CETIRIZINE HCL 10 MG PO TABS: 10.0000 mg | ORAL_TABLET | Freq: Every day | ORAL | Status: DC

## 2015-07-24 NOTE — Patient Instructions (Signed)
DASH Eating Plan DASH stands for "Dietary Approaches to Stop Hypertension." The DASH eating plan is a healthy eating plan that has been shown to reduce high blood pressure (hypertension). Additional health benefits may include reducing the risk of type 2 diabetes mellitus, heart disease, and stroke. The DASH eating plan may also help with weight loss. WHAT DO I NEED TO KNOW ABOUT THE DASH EATING PLAN? For the DASH eating plan, you will follow these general guidelines:  Choose foods with a percent daily value for sodium of less than 5% (as listed on the food label).  Use salt-free seasonings or herbs instead of table salt or sea salt.  Check with your health care provider or pharmacist before using salt substitutes.  Eat lower-sodium products, often labeled as "lower sodium" or "no salt added."  Eat fresh foods.  Eat more vegetables, fruits, and low-fat dairy products.  Choose whole grains. Look for the word "whole" as the first word in the ingredient list.  Choose fish and skinless chicken or turkey more often than red meat. Limit fish, poultry, and meat to 6 oz (170 g) each day.  Limit sweets, desserts, sugars, and sugary drinks.  Choose heart-healthy fats.  Limit cheese to 1 oz (28 g) per day.  Eat more home-cooked food and less restaurant, buffet, and fast food.  Limit fried foods.  Cook foods using methods other than frying.  Limit canned vegetables. If you do use them, rinse them well to decrease the sodium.  When eating at a restaurant, ask that your food be prepared with less salt, or no salt if possible. WHAT FOODS CAN I EAT? Seek help from a dietitian for individual calorie needs. Grains Whole grain or whole wheat bread. Brown rice. Whole grain or whole wheat pasta. Quinoa, bulgur, and whole grain cereals. Low-sodium cereals. Corn or whole wheat flour tortillas. Whole grain cornbread. Whole grain crackers. Low-sodium crackers. Vegetables Fresh or frozen vegetables  (raw, steamed, roasted, or grilled). Low-sodium or reduced-sodium tomato and vegetable juices. Low-sodium or reduced-sodium tomato sauce and paste. Low-sodium or reduced-sodium canned vegetables.  Fruits All fresh, canned (in natural juice), or frozen fruits. Meat and Other Protein Products Ground beef (85% or leaner), grass-fed beef, or beef trimmed of fat. Skinless chicken or turkey. Ground chicken or turkey. Pork trimmed of fat. All fish and seafood. Eggs. Dried beans, peas, or lentils. Unsalted nuts and seeds. Unsalted canned beans. Dairy Low-fat dairy products, such as skim or 1% milk, 2% or reduced-fat cheeses, low-fat ricotta or cottage cheese, or plain low-fat yogurt. Low-sodium or reduced-sodium cheeses. Fats and Oils Tub margarines without trans fats. Light or reduced-fat mayonnaise and salad dressings (reduced sodium). Avocado. Safflower, olive, or canola oils. Natural peanut or almond butter. Other Unsalted popcorn and pretzels. The items listed above may not be a complete list of recommended foods or beverages. Contact your dietitian for more options. WHAT FOODS ARE NOT RECOMMENDED? Grains White bread. White pasta. White rice. Refined cornbread. Bagels and croissants. Crackers that contain trans fat. Vegetables Creamed or fried vegetables. Vegetables in a cheese sauce. Regular canned vegetables. Regular canned tomato sauce and paste. Regular tomato and vegetable juices. Fruits Dried fruits. Canned fruit in light or heavy syrup. Fruit juice. Meat and Other Protein Products Fatty cuts of meat. Ribs, chicken wings, bacon, sausage, bologna, salami, chitterlings, fatback, hot dogs, bratwurst, and packaged luncheon meats. Salted nuts and seeds. Canned beans with salt. Dairy Whole or 2% milk, cream, half-and-half, and cream cheese. Whole-fat or sweetened yogurt. Full-fat   cheeses or blue cheese. Nondairy creamers and whipped toppings. Processed cheese, cheese spreads, or cheese  curds. Condiments Onion and garlic salt, seasoned salt, table salt, and sea salt. Canned and packaged gravies. Worcestershire sauce. Tartar sauce. Barbecue sauce. Teriyaki sauce. Soy sauce, including reduced sodium. Steak sauce. Fish sauce. Oyster sauce. Cocktail sauce. Horseradish. Ketchup and mustard. Meat flavorings and tenderizers. Bouillon cubes. Hot sauce. Tabasco sauce. Marinades. Taco seasonings. Relishes. Fats and Oils Butter, stick margarine, lard, shortening, ghee, and bacon fat. Coconut, palm kernel, or palm oils. Regular salad dressings. Other Pickles and olives. Salted popcorn and pretzels. The items listed above may not be a complete list of foods and beverages to avoid. Contact your dietitian for more information. WHERE CAN I FIND MORE INFORMATION? National Heart, Lung, and Blood Institute: www.nhlbi.nih.gov/health/health-topics/topics/dash/ Document Released: 12/05/2011 Document Revised: 05/02/2014 Document Reviewed: 10/20/2013 ExitCare Patient Information 2015 [employer]. This information is not intended to replace advice given to you by your health care provider. Make sure you discuss any questions you have with your health care provider. Diabetes Mellitus and Food It is important for you to manage your blood sugar (glucose) level. Your blood glucose level can be greatly affected by what you eat. Eating healthier foods in the appropriate amounts throughout the day at about the same time each day will help you control your blood glucose level. It can also help slow or prevent worsening of your diabetes mellitus. Healthy eating may even help you improve the level of your blood pressure and reach or maintain a healthy weight.  HOW CAN FOOD AFFECT ME? Carbohydrates Carbohydrates affect your blood glucose level more than any other type of food. Your dietitian will help you determine how many carbohydrates to eat at each meal and teach you how to count carbohydrates. Counting  carbohydrates is important to keep your blood glucose at a healthy level, especially if you are using insulin or taking certain medicines for diabetes mellitus. Alcohol Alcohol can cause sudden decreases in blood glucose (hypoglycemia), especially if you use insulin or take certain medicines for diabetes mellitus. Hypoglycemia can be a life-threatening condition. Symptoms of hypoglycemia (sleepiness, dizziness, and disorientation) are similar to symptoms of having too much alcohol.  If your health care provider has given you approval to drink alcohol, do so in moderation and use the following guidelines:  Women should not have more than one drink per day, and men should not have more than two drinks per day. One drink is equal to:  12 oz of beer.  5 oz of wine.  1 oz of hard liquor.  Do not drink on an empty stomach.  Keep yourself hydrated. Have water, diet soda, or unsweetened iced tea.  Regular soda, juice, and other mixers might contain a lot of carbohydrates and should be counted. WHAT FOODS ARE NOT RECOMMENDED? As you make food choices, it is important to remember that all foods are not the same. Some foods have fewer nutrients per serving than other foods, even though they might have the same number of calories or carbohydrates. It is difficult to get your body what it needs when you eat foods with fewer nutrients. Examples of foods that you should avoid that are high in calories and carbohydrates but low in nutrients include:  Trans fats (most processed foods list trans fats on the Nutrition Facts label).  Regular soda.  Juice.  Candy.  Sweets, such as cake, pie, doughnuts, and cookies.  Fried foods. WHAT FOODS CAN I EAT? Have nutrient-rich foods,   which will nourish your body and keep you healthy. The food you should eat also will depend on several factors, including:  The calories you need.  The medicines you take.  Your weight.  Your blood glucose level.  Your  blood pressure level.  Your cholesterol level. You also should eat a variety of foods, including:  Protein, such as meat, poultry, fish, tofu, nuts, and seeds (lean animal proteins are best).  Fruits.  Vegetables.  Dairy products, such as milk, cheese, and yogurt (low fat is best).  Breads, grains, pasta, cereal, rice, and beans.  Fats such as olive oil, trans fat-free margarine, canola oil, avocado, and olives. DOES EVERYONE WITH DIABETES MELLITUS HAVE THE SAME MEAL PLAN? Because every person with diabetes mellitus is different, there is not one meal plan that works for everyone. It is very important that you meet with a dietitian who will help you create a meal plan that is just right for you. Document Released: 09/12/2005 Document Revised: 12/21/2013 Document Reviewed: 11/12/2013 ExitCare Patient Information 2015 [employer]. This information is not intended to replace advice given to you by your health care provider. Make sure you discuss any questions you have with your health care provider.  

## 2015-07-24 NOTE — Progress Notes (Signed)
MRN: [id number] Name: Raymond Ryan  Sex: male Age: 49 y.o. DOB: 1966-06-08  Allergies: Review of patient's allergies indicates no known allergies.  Chief Complaint  Patient presents with  . Follow-up    HPI: Patient is 48 y.o. male who has history of diabetes hypertension hyperlipidemia comes today for followup her, 2 weeks ago he was seen by Dr. Jarold Song, at the time he had traumatic subungual hematoma of right big toe, at that time he was started on antibiotic as per patient his half nail fell off, swelling is improved her denies any fever chills any discharge, patient has lost follow up with  Podiatrist, his insulin was also switched to [place] as per patient he still has some itching in his arms which is somewhat better than before, denies any chest pain or shortness of breath, denies any recent change in soap detergent.  Past Medical History  Diagnosis Date  . Diabetes mellitus   . Hypertension     History reviewed. No pertinent past surgical history.    Medication List       This list is accurate as of: 07/24/15 11:38 AM.  Always use your most recent med list.               atorvastatin 20 MG tablet  Commonly known as:  LIPITOR  Take 1 tablet (20 mg total) by mouth daily.     cephALEXin 500 MG capsule  Commonly known as:  KEFLEX  Take 1 capsule (500 mg total) by mouth 4 (four) times daily.     cetirizine 10 MG tablet  Commonly known as:  ZYRTEC  Take 1 tablet (10 mg total) by mouth daily.     gabapentin 300 MG capsule  Commonly known as:  NEURONTIN  Take 1 capsule (300 mg total) by mouth at bedtime as needed.     glipiZIDE 10 MG tablet  Commonly known as:  GLUCOTROL  Take 2 tablets (20 mg total) by mouth 2 (two) times daily before a meal.     Liraglutide 18 MG/3ML Sopn  Commonly known as:  VICTOZA  Administer subcutaneously 0.6mg  daily x1 week, then 1.2mg  x1 week , then 1.8mg  daily subsequently     lisinopril-hydrochlorothiazide 20-12.5 MG per tablet   Commonly known as:  PRINZIDE,ZESTORETIC  Take 1 tablet by mouth daily.     magnesium hydroxide 400 MG/5ML suspension  Commonly known as:  MILK OF MAGNESIA  Take 15 mLs by mouth daily as needed for mild constipation.     metFORMIN 500 MG 24 hr tablet  Commonly known as:  GLUCOPHAGE XR  Take 2 tablets (1,000 mg total) by mouth 2 (two) times daily with a meal.        Meds ordered this encounter  Medications  . cetirizine (ZYRTEC) 10 MG tablet    Sig: Take 1 tablet (10 mg total) by mouth daily.    Dispense:  30 tablet    Refill:  3    Immunization History  Administered Date(s) Administered  . Influenza Split 11/02/2012  . Pneumococcal Polysaccharide-23 11/02/2012  . Tdap 11/02/2012    Family History  Problem Relation Age of Onset  . Diabetes Mellitus II Mother   . CAD Mother     History  Substance Use Topics  . Smoking status: Never Smoker   . Smokeless tobacco: Not on file  . Alcohol Use: No    Review of Systems   As noted in HPI  Filed Vitals:   07/24/15 1132  BP: 120/80  Pulse:   Temp:   Resp:     Physical Exam  Physical Exam  Constitutional: No distress.  Eyes: EOM are normal. Pupils are equal, round, and reactive to light.  Cardiovascular: Normal rate and regular rhythm.   Pulmonary/Chest: Breath sounds normal. No respiratory distress. He has no wheezes. He has no rales.  Musculoskeletal: He exhibits no edema.  Right foot big toe no swelling, nail covering half of the nailbed, no apparent discharge     CBC    Component Value Date/Time   WBC 7.3 11/02/2012 0730   RBC 4.43 11/02/2012 0730   HGB 12.4* 11/02/2012 0730   HCT 37.8* 11/02/2012 0730   PLT 549* 11/02/2012 0730   MCV 85.3 11/02/2012 0730   LYMPHSABS 2.1 11/02/2012 0730   MONOABS 0.6 11/02/2012 0730   EOSABS 0.4 11/02/2012 0730   BASOSABS 0.1 11/02/2012 0730    CMP     Component Value Date/Time   NA 136 04/05/2015 1238   K 5.0 04/05/2015 1238   CL 97 04/05/2015 1238   CO2  24 04/05/2015 1238   GLUCOSE 293* 04/05/2015 1238   BUN 21 04/05/2015 1238   CREATININE 1.38* 04/05/2015 1238   CREATININE 1.14 11/02/2012 0730   CALCIUM 10.1 04/05/2015 1238   PROT 8.2 04/05/2015 1238   ALBUMIN 4.5 04/05/2015 1238   AST 13 04/05/2015 1238   ALT 18 04/05/2015 1238   ALKPHOS 85 04/05/2015 1238   BILITOT 0.6 04/05/2015 1238   GFRNONAA 60 04/05/2015 1238   GFRNONAA 76* 11/02/2012 0730   GFRAA 70 04/05/2015 1238   GFRAA 88* 11/02/2012 0730    Lab Results  Component Value Date/Time   CHOL 189 04/05/2015 12:38 PM    Lab Results  Component Value Date/Time   HGBA1C 11.9% 07/12/2015 09:50 AM   HGBA1C 7.9* 06/25/2013 02:01 PM    Lab Results  Component Value Date/Time   AST 13 04/05/2015 12:38 PM    Assessment and Plan  Other specified diabetes mellitus without complications - Plan:  Results for orders placed or performed in visit on 07/24/15  Glucose (CBG)  Result Value Ref Range   POC Glucose 192 (A) 70 - 99 mg/dl   Patient has recently been started on victoza , is a titrated the dose up to 1.8 mg , continue with metformin and Glucotrol, advise patient for diabetes meal planning, repeat A1c in 3 months, Ambulatory referral to Podiatry  Essential hypertension - Plan: blood pressure is controlled continue with current meds, repeat blood chemistry COMPLETE METABOLIC PANEL WITH GFR  Itching - Plan: cetirizine (ZYRTEC) 10 MG tablet  Hyperlipidemia - Plan: currently patient is on Lipitor 20 mg, repeat her  Lipid panel   Return in about 3 months (around 10/24/2015), or if symptoms worsen or fail to improve.   This note has been created with Surveyor, quantity. Any transcriptional errors are unintentional.    Lorayne Marek, MD

## 2015-07-24 NOTE — Progress Notes (Signed)
Patient here for follow up on his diabetes Patient has some concerns with his insulin His fasting blood sugars have been running high

## 2015-07-25 ENCOUNTER — Telehealth: Payer: Self-pay

## 2015-07-25 NOTE — Telephone Encounter (Signed)
Patient not available Left message on voice mail to return our call 

## 2015-07-25 NOTE — Telephone Encounter (Signed)
-----   Message from Lorayne Marek, MD sent at 07/25/2015 10:52 AM EDT ----- Blood work reviewed, call and let the patient know that his creatinine is elevated, advise patient to drink plenty of water to prevent dehydration, also avoid taking any over-the-counter NSAIDs including Aleve, ibuprofen, will repeat blood chemistry on the following visit. Also let  the patient know that his cholesterol level is improved, still has elevated triglycerides, advise patient for low fat diet and exercise, repeat fasting lipid panel on the next visit.

## 2015-07-31 ENCOUNTER — Telehealth: Payer: Self-pay

## 2015-07-31 NOTE — Telephone Encounter (Signed)
Returned patient phone call Patient not available Left message on voice mail to return our call 

## 2015-07-31 NOTE — Telephone Encounter (Signed)
Pt. Is returning nurse's call for results....please call patient

## 2015-08-01 NOTE — Telephone Encounter (Signed)
Patient called Nurse Supervisor telephone and left message explaining someone had called with lab results and patient wanted to know about dental referral. Nurse returned call to patient, patient verified date of birth.  Patient aware of the following:  Creatinine is elevated, patient is advised to drink plenty of water to prevent dehydration. Patient agrees to avoid Nsaids, such as aleve and ibuprofen. Patient aware of improved cholesterol but elevated triglycerides. Nurse advised patient to eat low fat diet and exercise. Patient agrees to repeat fasting labs on next visit.  Patient is requesting dental referral, patient explains he has been asking about it for 2 months.  Nurse will send message to provider.

## 2015-08-08 ENCOUNTER — Other Ambulatory Visit: Payer: Self-pay | Admitting: Internal Medicine

## 2015-08-09 ENCOUNTER — Telehealth: Payer: Self-pay | Admitting: General Practice

## 2015-08-09 ENCOUNTER — Other Ambulatory Visit: Payer: Self-pay

## 2015-08-09 DIAGNOSIS — I10 Essential (primary) hypertension: Secondary | ICD-10-CM

## 2015-08-09 MED ORDER — LISINOPRIL-HYDROCHLOROTHIAZIDE 20-12.5 MG PO TABS: 1.0000 | ORAL_TABLET | Freq: Every day | ORAL | Status: DC

## 2015-08-09 NOTE — Telephone Encounter (Signed)
Patient presents to clinic to request medication refill for lisinopril-hydrochlorothiazide (PRINZIDE,ZESTORETIC) 20-12.5 MG per tablet Please assist

## 2015-08-15 ENCOUNTER — Ambulatory Visit: Payer: No Typology Code available for payment source | Admitting: Podiatry

## 2015-09-20 ENCOUNTER — Ambulatory Visit: Payer: No Typology Code available for payment source | Admitting: Podiatry

## 2015-11-01 ENCOUNTER — Ambulatory Visit: Payer: Self-pay | Attending: Family Medicine

## 2015-11-15 ENCOUNTER — Encounter: Payer: Self-pay | Admitting: Internal Medicine

## 2015-11-15 ENCOUNTER — Ambulatory Visit: Payer: Self-pay | Attending: Internal Medicine | Admitting: Internal Medicine

## 2015-11-15 VITALS — BP 127/87 | HR 106 | Temp 98.7°F | Resp 16 | Ht 76.0 in | Wt 274.0 lb

## 2015-11-15 DIAGNOSIS — Z79899 Other long term (current) drug therapy: Secondary | ICD-10-CM | POA: Insufficient documentation

## 2015-11-15 DIAGNOSIS — Z833 Family history of diabetes mellitus: Secondary | ICD-10-CM | POA: Insufficient documentation

## 2015-11-15 DIAGNOSIS — Z0000 Encounter for general adult medical examination without abnormal findings: Secondary | ICD-10-CM

## 2015-11-15 DIAGNOSIS — Z7984 Long term (current) use of oral hypoglycemic drugs: Secondary | ICD-10-CM | POA: Insufficient documentation

## 2015-11-15 DIAGNOSIS — E119 Type 2 diabetes mellitus without complications: Secondary | ICD-10-CM

## 2015-11-15 DIAGNOSIS — E1142 Type 2 diabetes mellitus with diabetic polyneuropathy: Secondary | ICD-10-CM | POA: Insufficient documentation

## 2015-11-15 DIAGNOSIS — I10 Essential (primary) hypertension: Secondary | ICD-10-CM | POA: Insufficient documentation

## 2015-11-15 DIAGNOSIS — Z8249 Family history of ischemic heart disease and other diseases of the circulatory system: Secondary | ICD-10-CM | POA: Insufficient documentation

## 2015-11-15 LAB — POCT GLYCOSYLATED HEMOGLOBIN (HGB A1C): HEMOGLOBIN A1C: 8.4

## 2015-11-15 LAB — GLUCOSE, POCT (MANUAL RESULT ENTRY): POC GLUCOSE: 99 mg/dL (ref 70–99)

## 2015-11-15 MED ORDER — CANAGLIFLOZIN 100 MG PO TABS: 100.0000 mg | ORAL_TABLET | Freq: Every day | ORAL | Status: DC

## 2015-11-15 MED ORDER — LISINOPRIL-HYDROCHLOROTHIAZIDE 20-12.5 MG PO TABS: 1.0000 | ORAL_TABLET | Freq: Every day | ORAL | Status: DC

## 2015-11-15 NOTE — Progress Notes (Signed)
F/U DM No pain today  No tobacco user No suicidal thought in the past two weeks Dental referral

## 2015-11-15 NOTE — Patient Instructions (Signed)
Call me back in 1-2 weeks to know if you were approved for Invokana.

## 2015-11-15 NOTE — Progress Notes (Signed)
Patient ID: Raymond Ryan, male   DOB: 05-09-1966, 49 y.o.   MRN: [id number]  CC: f/u  HPI: Raymond Ryan is a 49 year old male patient with a history of type II uncontrolled diabetes mellitus, peripheral neuropathy, hypertension. Patient is currently taking Victoza, Metformin, and glipizide daily. Since his last A1C he has come down substantially to 8.4%. 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 Patient is very anxious and wants to be switched to a oral medication if possible.  He takes his blood pressure medication daily with complications of headaches, chest pain, edema, or palpitations.  He would like a dental referral today---just renewed orange card. He has several cavities and has been having pain in his back upper and lower molars.   Patient has No headache, No chest pain, No abdominal pain - No Nausea, No new weakness tingling or numbness, No Cough - SOB.  No Known Allergies Past Medical History  Diagnosis Date  . Diabetes mellitus   . Hypertension    Current Outpatient Prescriptions on File Prior to Visit  Medication Sig Dispense Refill  . atorvastatin (LIPITOR) 20 MG tablet Take 1 tablet (20 mg total) by mouth daily. 90 tablet 3  . gabapentin (NEURONTIN) 300 MG capsule Take 1 capsule (300 mg total) by mouth at bedtime as needed. 30 capsule 3  . glipiZIDE (GLUCOTROL) 10 MG tablet Take 2 tablets (20 mg total) by mouth 2 (two) times daily before a meal. 120 tablet 3  . Liraglutide (VICTOZA) 18 MG/3ML SOPN Administer subcutaneously 0.6mg  daily x1 week, then 1.2mg  x1 week , then 1.8mg  daily subsequently 9 mL 5  . lisinopril-hydrochlorothiazide (PRINZIDE,ZESTORETIC) 20-12.5 MG per tablet Take 1 tablet by mouth daily. 30 tablet 3  . metFORMIN (GLUCOPHAGE XR) 500 MG 24 hr tablet Take 2 tablets (1,000 mg total) by mouth 2 (two) times daily with a meal.  120 tablet 4  . cephALEXin (KEFLEX) 500 MG capsule Take 1 capsule (500 mg total) by mouth 4 (four) times daily. (Patient not taking: Reported on 11/15/2015) 40 capsule 0  . cetirizine (ZYRTEC) 10 MG tablet Take 1 tablet (10 mg total) by mouth daily. (Patient not taking: Reported on 11/15/2015) 30 tablet 3  . magnesium hydroxide (MILK OF MAGNESIA) 400 MG/5ML suspension Take 15 mLs by mouth daily as needed for mild constipation. (Patient not taking: Reported on 11/15/2015) 360 mL 0   No current facility-administered medications on file prior to visit.   Family History  Problem Relation Age of Onset  . Diabetes Mellitus II Mother   . CAD Mother    Social History   Social History  . Marital Status: Single    Spouse Name: N/A  . Number of Children: N/A  . Years of Education: N/A   Occupational History  . Not on file.   Social History Main Topics  . Smoking status: Never Smoker   . Smokeless tobacco: Not on file  . Alcohol Use: No  . Drug Use: No  . Sexual Activity: Not on file   Other Topics Concern  . Not on file   Social History Narrative    Review of Systems: Other than what is stated in HPI, all other systems are negative.   Objective:   Filed Vitals:   11/15/15 1501  BP: 127/87  Pulse: 106  Temp: 98.7 F (37.1 C)  Resp: 16    Physical Exam  Constitutional: He is oriented to person, place, and time.  HENT:  Mouth/Throat: Dental caries present.  Cardiovascular: Normal rate, regular rhythm and normal heart sounds.   Pulses:      Dorsalis pedis pulses are 2+ on the right side, and 2+ on the left side.       Posterior tibial pulses are 2+ on the right side, and 2+ on the left side.  Pulmonary/Chest: Effort normal and breath sounds normal.  Feet:  Right Foot:  Protective Sensation: 10 sites tested.1 site sensed. Skin Integrity: Negative for skin breakdown.  Left Foot:  Protective Sensation: 10 sites tested. 0 sites sensed. Skin Integrity: Negative for skin  breakdown.  Neurological: He is alert and oriented to person, place, and time.  Skin: Skin is warm and dry.     Lab Results  Component Value Date   WBC 7.3 11/02/2012   HGB 12.4* 11/02/2012   HCT 37.8* 11/02/2012   MCV 85.3 11/02/2012   PLT 549* 11/02/2012   Lab Results  Component Value Date   CREATININE 1.53* 07/24/2015   BUN 24 07/24/2015   NA 140 07/24/2015   K 5.2 07/24/2015   CL 101 07/24/2015   CO2 26 07/24/2015    Lab Results  Component Value Date   HGBA1C 8.40 11/15/2015   Lipid Panel     Component Value Date/Time   CHOL 154 07/24/2015 1138   TRIG 185* 07/24/2015 1138   HDL 29* 07/24/2015 1138   CHOLHDL 5.3* 07/24/2015 1138   VLDL 37* 07/24/2015 1138   LDLCALC 88 07/24/2015 1138       Assessment and plan:   Amin was seen today for diabetes and medication refill.  Diagnoses and all orders for this visit:  Type 2 diabetes mellitus without complication, without long-term current use of insulin (HCC) -     Glucose (CBG) -     HgB A1c -     Flu Vaccine QUAD 36+ mos IM -     canagliflozin (INVOKANA) 100 MG TABS tablet; Take 1 tablet (100 mg total) by mouth daily before breakfast. I will d/c Victoza and switch him over to Invokana so that he is able to perform his job. He will apply for patient assistance program with pharmacy.  Diet, exercise, and long term complications addressed with patient  HTN Patient blood pressure is stable and may continue on current medication.  Education on diet, exercise, and modifiable risk factors discussed. Will obtain appropriate labs as needed. Will follow up in 3-6 months.    Healthcare maintenance Flu vaccine given in office     Return in about 3 months (around 02/15/2016) for DM/HTN.   Lance Bosch, [place] [phone] 11/15/2015, 3:15 PM

## 2015-12-27 ENCOUNTER — Other Ambulatory Visit: Payer: Self-pay | Admitting: *Deleted

## 2015-12-27 DIAGNOSIS — E089 Diabetes mellitus due to underlying condition without complications: Secondary | ICD-10-CM

## 2015-12-27 DIAGNOSIS — E119 Type 2 diabetes mellitus without complications: Secondary | ICD-10-CM

## 2015-12-27 MED ORDER — CANAGLIFLOZIN 100 MG PO TABS: 100.0000 mg | ORAL_TABLET | Freq: Every day | ORAL | Status: DC

## 2016-01-02 ENCOUNTER — Telehealth: Payer: Self-pay | Admitting: Internal Medicine

## 2016-01-02 NOTE — Telephone Encounter (Signed)
Patient dropped off DMV paperwork to be completed by doctor.

## 2016-01-03 ENCOUNTER — Telehealth: Payer: Self-pay

## 2016-01-03 ENCOUNTER — Telehealth: Payer: Self-pay | Admitting: Internal Medicine

## 2016-01-03 LAB — HM DIABETES EYE EXAM

## 2016-01-03 NOTE — Telephone Encounter (Signed)
Tried to contact patient  Patient not available Left message on voice mail to return our call 

## 2016-01-03 NOTE — Telephone Encounter (Signed)
Patient picked up completed paperwork by doctor.

## 2016-01-05 ENCOUNTER — Other Ambulatory Visit: Payer: Self-pay

## 2016-01-05 DIAGNOSIS — E119 Type 2 diabetes mellitus without complications: Secondary | ICD-10-CM

## 2016-01-05 MED ORDER — CANAGLIFLOZIN 100 MG PO TABS: 100.0000 mg | ORAL_TABLET | Freq: Every day | ORAL | Status: DC

## 2016-01-11 ENCOUNTER — Other Ambulatory Visit: Payer: Self-pay | Admitting: Internal Medicine

## 2016-01-17 ENCOUNTER — Other Ambulatory Visit: Payer: Self-pay | Admitting: Internal Medicine

## 2016-01-17 ENCOUNTER — Other Ambulatory Visit: Payer: Self-pay

## 2016-01-17 DIAGNOSIS — E119 Type 2 diabetes mellitus without complications: Secondary | ICD-10-CM

## 2016-01-17 MED ORDER — CANAGLIFLOZIN 100 MG PO TABS: 100.0000 mg | ORAL_TABLET | Freq: Every day | ORAL | Status: DC

## 2016-01-17 NOTE — Telephone Encounter (Signed)
Nurse reordered Invokana prescription due to prescription being printed on plain white paper instead of prescription paper.

## 2016-01-21 ENCOUNTER — Emergency Department (HOSPITAL_COMMUNITY)
Admission: EM | Admit: 2016-01-21 | Discharge: 2016-01-21 | Disposition: A | Discharge status: Home / Self Care | Payer: Self-pay | Attending: Emergency Medicine | Admitting: Emergency Medicine

## 2016-01-21 ENCOUNTER — Emergency Department (HOSPITAL_COMMUNITY): Payer: Self-pay

## 2016-01-21 ENCOUNTER — Encounter (HOSPITAL_COMMUNITY): Payer: Self-pay | Admitting: Emergency Medicine

## 2016-01-21 DIAGNOSIS — I10 Essential (primary) hypertension: Secondary | ICD-10-CM | POA: Insufficient documentation

## 2016-01-21 DIAGNOSIS — E11610 Type 2 diabetes mellitus with diabetic neuropathic arthropathy: Secondary | ICD-10-CM

## 2016-01-21 DIAGNOSIS — G8929 Other chronic pain: Secondary | ICD-10-CM | POA: Insufficient documentation

## 2016-01-21 DIAGNOSIS — Z79899 Other long term (current) drug therapy: Secondary | ICD-10-CM | POA: Insufficient documentation

## 2016-01-21 DIAGNOSIS — Z7984 Long term (current) use of oral hypoglycemic drugs: Secondary | ICD-10-CM | POA: Insufficient documentation

## 2016-01-21 HISTORY — DX: Type 2 diabetes mellitus with diabetic neuropathic arthropathy: E11.610

## 2016-01-21 MED ORDER — OXYCODONE-ACETAMINOPHEN 5-325 MG PO TABS
1.0000 | ORAL_TABLET | Freq: Once | ORAL | Status: AC
  Administered 2016-01-21: 1 via ORAL
  Filled 2016-01-21: qty 1

## 2016-01-21 MED ORDER — OXYCODONE-ACETAMINOPHEN 5-325 MG PO TABS: 2.0000 | ORAL_TABLET | ORAL | Status: DC | PRN

## 2016-01-21 NOTE — ED Notes (Signed)
The pt is alert  See notes from triage and pa.  No distress   Med given  Waiting for xray

## 2016-01-21 NOTE — Discharge Instructions (Signed)
Diabetes and Foot Care Diabetes may cause you to have problems because of poor blood supply (circulation) to your feet and legs. This may cause the skin on your feet to become thinner, break easier, and heal more slowly. Your skin may become dry, and the skin may peel and crack. You may also have nerve damage in your legs and feet causing decreased feeling in them. You may not notice minor injuries to your feet that could lead to infections or more serious problems. Taking care of your feet is one of the most important things you can do for yourself.  HOME CARE INSTRUCTIONS  Wear shoes at all times, even in the house. Do not go barefoot. Bare feet are easily injured.  Check your feet daily for blisters, cuts, and redness. If you cannot see the bottom of your feet, use a mirror or ask someone for help.  Wash your feet with warm water (do not use hot water) and mild soap. Then pat your feet and the areas between your toes until they are completely dry. Do not soak your feet as this can dry your skin.  Apply a moisturizing lotion or petroleum jelly (that does not contain alcohol and is unscented) to the skin on your feet and to dry, brittle toenails. Do not apply lotion between your toes.  Trim your toenails straight across. Do not dig under them or around the cuticle. File the edges of your nails with an emery board or nail file.  Do not cut corns or calluses or try to remove them with medicine.  Wear clean socks or stockings every day. Make sure they are not too tight. Do not wear knee-high stockings since they may decrease blood flow to your legs.  Wear shoes that fit properly and have enough cushioning. To break in new shoes, wear them for just a few hours a day. This prevents you from injuring your feet. Always look in your shoes before you put them on to be sure there are no objects inside.  Do not cross your legs. This may decrease the blood flow to your feet.  If you find a minor scrape,  cut, or break in the skin on your feet, keep it and the skin around it clean and dry. These areas may be cleansed with mild soap and water. Do not cleanse the area with peroxide, alcohol, or iodine.  When you remove an adhesive bandage, be sure not to damage the skin around it.  If you have a wound, look at it several times a day to make sure it is healing.  Do not use heating pads or hot water bottles. They may burn your skin. If you have lost feeling in your feet or legs, you may not know it is happening until it is too late.  Make sure your health care provider performs a complete foot exam at least annually or more often if you have foot problems. Report any cuts, sores, or bruises to your health care provider immediately. SEEK MEDICAL CARE IF:   You have an injury that is not healing.  You have cuts or breaks in the skin.  You have an ingrown nail.  You notice redness on your legs or feet.  You feel burning or tingling in your legs or feet.  You have pain or cramps in your legs and feet.  Your legs or feet are numb.  Your feet always feel cold. SEEK IMMEDIATE MEDICAL CARE IF:   There is increasing redness,  swelling, or pain in or around a wound.  There is a red line that goes up your leg.  Pus is coming from a wound.  You develop a fever or as directed by your health care provider.  You notice a bad smell coming from an ulcer or wound.   This information is not intended to replace advice given to you by your health care provider. Make sure you discuss any questions you have with your health care provider.  Follow up with your podiatrist for reevaluation. Continue wearing a walking boot. Take pain medication as needed. Return to the emergency department if you experience severe increase in your pain, new trauma or injury, redness or swelling around your foot, numbness or tingling in your extremity that is new.

## 2016-01-21 NOTE — ED Notes (Addendum)
Pt reports history of Charcot's foot.  States he took his boot off so he could go to work last night and now c/o R foot pain and R hip pain since ambulating without boot.   Pt states he has been wearing boot for 8 months.

## 2016-01-21 NOTE — ED Notes (Signed)
Pt returned from xray

## 2016-01-23 NOTE — ED Provider Notes (Signed)
CSN: [id number]     Arrival date & time 01/21/16  1516 History   First MD Initiated Contact with Patient 01/21/16 1642     Chief Complaint  Patient presents with  . Foot Pain  . Hip Pain     (Consider location/radiation/quality/duration/timing/severity/associated sxs/prior Treatment) HPI   Raymond Ryan is a 50 y.o m with a pmhx of DM, Charcot foot, HTN who presents to the ED c/o R foot pain and R hip pain. Pt states that he has chronic R foot pain due to Charcot foot for which he sees podiatry for. He has been wearing a walking boot as recommended by podiatry for 8 months until this past week. Pt states that he boot is worn out and needs a new one. Pt states that he went back to work last night without his boot and was walking on uneven ground. His R foot and R hip began aching at work last night, but today when he woke up the pain was significantly worse. No new trauma or injury. Pain is worse with ambulation. Pt has chronic neuropathy, no new numbness/paresthesias. No discoloration or edema.   Past Medical History  Diagnosis Date  . Diabetes mellitus   . Hypertension   . Charcot foot due to diabetes mellitus ([place])    History reviewed. No pertinent past surgical history. Family History  Problem Relation Age of Onset  . Diabetes Mellitus II Mother   . CAD Mother    Social History  Substance Use Topics  . Smoking status: Never Smoker   . Smokeless tobacco: None  . Alcohol Use: No    Review of Systems  All other systems reviewed and are negative.     Allergies  Review of patient's allergies indicates no known allergies.  Home Medications   Prior to Admission medications   Medication Sig Start Date End Date Taking? Authorizing Provider  atorvastatin (LIPITOR) 20 MG tablet Take 1 tablet (20 mg total) by mouth daily. 04/05/15   Lorayne Marek, MD  canagliflozin (INVOKANA) 100 MG TABS tablet Take 1 tablet (100 mg total) by mouth daily before breakfast. 01/17/16   Tresa Garter, MD  cephALEXin (KEFLEX) 500 MG capsule Take 1 capsule (500 mg total) by mouth 4 (four) times daily. Patient not taking: Reported on 11/15/2015 07/12/15   Arnoldo Morale, MD  cetirizine (ZYRTEC) 10 MG tablet Take 1 tablet (10 mg total) by mouth daily. Patient not taking: Reported on 11/15/2015 07/24/15   Lorayne Marek, MD  gabapentin (NEURONTIN) 300 MG capsule Take 1 capsule (300 mg total) by mouth at bedtime as needed. 04/05/15   Lorayne Marek, MD  glipiZIDE (GLUCOTROL) 10 MG tablet Take 2 tablets (20 mg total) by mouth 2 (two) times daily before a meal. 04/05/15   Lorayne Marek, MD  lisinopril-hydrochlorothiazide (PRINZIDE,ZESTORETIC) 20-12.5 MG tablet Take 1 tablet by mouth daily. 11/15/15   Lance Bosch, NP  magnesium hydroxide (MILK OF MAGNESIA) 400 MG/5ML suspension Take 15 mLs by mouth daily as needed for mild constipation. Patient not taking: Reported on 11/15/2015 07/27/14   Lorayne Marek, MD  metFORMIN (GLUCOPHAGE XR) 500 MG 24 hr tablet Take 2 tablets (1,000 mg total) by mouth 2 (two) times daily with a meal. 04/05/15   Lorayne Marek, MD  oxyCODONE-acetaminophen (PERCOCET/ROXICET) 5-325 MG tablet Take 2 tablets by mouth every 4 (four) hours as needed for severe pain. 01/21/16   Reggie Bise Tripp Giles Currie, [name]   BP 114/65 mmHg  Pulse 86  Temp(Src) 99.3 F (37.4 C) (  Oral)  Resp 18  Ht 6\' 3"  (1.905 m)  Wt 121.564 kg  BMI 33.50 kg/m2  SpO2 98% Physical Exam  Constitutional: He is oriented to person, place, and time. He appears well-developed and well-nourished. No distress.  HENT:  Head: Normocephalic and atraumatic.  Eyes: Conjunctivae are normal. Right eye exhibits no discharge. Left eye exhibits no discharge. No scleral icterus.  Cardiovascular: Normal rate and intact distal pulses.   Pulmonary/Chest: Effort normal.  Musculoskeletal: Normal range of motion. He exhibits no edema or tenderness.  Chronic foot changes due to Charcot foot. No wound or ulceration.  No decrease Rom of R  hip. No pain with internal or external rotation. Negative SLR. No hip length discrepancy.   Neurological: He is alert and oriented to person, place, and time. Coordination normal.  Skin: Skin is warm and dry. No rash noted. He is not diaphoretic. No erythema. No pallor.  Psychiatric: He has a normal mood and affect. His behavior is normal.  Nursing note and vitals reviewed.   ED Course  Procedures (including critical care time) Labs Review Labs Reviewed - No data to display  Imaging Review No results found. I have personally reviewed and evaluated these images and lab results as part of my medical decision-making.   EKG Interpretation None      MDM   Final diagnoses:  Charcot foot due to diabetes mellitus ([place])    Pt presents with R foot and hip pain due to chronic Charcot foot. Pt has been ambulating without his Cam walker, suspect this is cause of pts worsening pain. No new trauma or injury. Xray reveals chronic Charcot foot changes. No acute abnormality. Pt is ambulating in ED without difficulty. Will order CAM walker for pt. Pain managed in ED. Recommend follow up with podiatrist. Return precautions outlined in patient discharge instructions.      [place], [name] 01/24/16 [place], DO 01/24/16 2357

## 2016-01-29 MED FILL — INVOKANA 100 MG TABLET: 100 | 30 days supply | Qty: 30 | Fill #3

## 2016-01-29 MED FILL — LISINOPRIL-HCTZ 20-12.5 MG: 20-12.5 | 30 days supply | Qty: 30 | Fill #1

## 2016-02-14 ENCOUNTER — Ambulatory Visit: Payer: Self-pay | Attending: Internal Medicine | Admitting: Internal Medicine

## 2016-02-14 ENCOUNTER — Encounter: Payer: Self-pay | Admitting: Internal Medicine

## 2016-02-14 VITALS — BP 126/86 | HR 85 | Temp 98.0°F | Resp 16 | Ht 75.0 in | Wt 273.0 lb

## 2016-02-14 DIAGNOSIS — E119 Type 2 diabetes mellitus without complications: Secondary | ICD-10-CM

## 2016-02-14 DIAGNOSIS — K0889 Other specified disorders of teeth and supporting structures: Secondary | ICD-10-CM

## 2016-02-14 DIAGNOSIS — Z79899 Other long term (current) drug therapy: Secondary | ICD-10-CM | POA: Insufficient documentation

## 2016-02-14 DIAGNOSIS — I10 Essential (primary) hypertension: Secondary | ICD-10-CM

## 2016-02-14 DIAGNOSIS — Z7984 Long term (current) use of oral hypoglycemic drugs: Secondary | ICD-10-CM | POA: Insufficient documentation

## 2016-02-14 DIAGNOSIS — E785 Hyperlipidemia, unspecified: Secondary | ICD-10-CM

## 2016-02-14 LAB — GLUCOSE, POCT (MANUAL RESULT ENTRY): POC Glucose: 172 mg/dL — AB (ref 70–99)

## 2016-02-14 LAB — LIPID PANEL
CHOL/HDL RATIO: 6.5 ratio — AB (ref <=5.0)
Cholesterol: 196 mg/dL (ref 125–200)
HDL: 30 mg/dL — AB (ref >=40)
LDL CALC: 130 mg/dL — AB (ref <130)
Triglycerides: 178 mg/dL — ABNORMAL HIGH (ref <150)
VLDL: 36 mg/dL — ABNORMAL HIGH (ref <30)

## 2016-02-14 LAB — POCT GLYCOSYLATED HEMOGLOBIN (HGB A1C): Hemoglobin A1C: 8.6

## 2016-02-14 LAB — COMPLETE METABOLIC PANEL WITH GFR
ALT: 13 U/L (ref 9–46)
AST: 12 U/L (ref 10–40)
Albumin: 4.3 g/dL (ref 3.6–5.1)
Alkaline Phosphatase: 75 U/L (ref 40–115)
BUN: 17 mg/dL (ref 7–25)
CO2: 26 mmol/L (ref 20–31)
Calcium: 10.3 mg/dL (ref 8.6–10.3)
Chloride: 99 mmol/L (ref 98–110)
Creat: 1.61 mg/dL — ABNORMAL HIGH (ref 0.60–1.35)
GFR, EST NON AFRICAN AMERICAN: 50 mL/min — AB (ref >=60)
GFR, Est African American: 58 mL/min — ABNORMAL LOW (ref >=60)
GLUCOSE: 160 mg/dL — AB (ref 65–99)
POTASSIUM: 4.7 mmol/L (ref 3.5–5.3)
SODIUM: 137 mmol/L (ref 135–146)
Total Bilirubin: 0.7 mg/dL (ref 0.2–1.2)
Total Protein: 7.8 g/dL (ref 6.1–8.1)

## 2016-02-14 MED ORDER — METFORMIN HCL ER 500 MG PO TB24: 1000.0000 mg | ORAL_TABLET | Freq: Two times a day (BID) | ORAL | Status: DC

## 2016-02-14 MED ORDER — ATORVASTATIN CALCIUM 20 MG PO TABS: 20.0000 mg | ORAL_TABLET | Freq: Every day | ORAL | Status: DC

## 2016-02-14 MED ORDER — CANAGLIFLOZIN 100 MG PO TABS: 300.0000 mg | ORAL_TABLET | Freq: Every day | ORAL | Status: DC

## 2016-02-14 MED ORDER — LISINOPRIL-HYDROCHLOROTHIAZIDE 20-12.5 MG PO TABS: 1.0000 | ORAL_TABLET | Freq: Every day | ORAL | Status: DC

## 2016-02-14 MED FILL — glipiZIDE 10 MG TABS: 10 | 30 days supply | Qty: 120 | Fill #2

## 2016-02-14 MED FILL — INVOKANA 100 MG TABLET: 100 | 30 days supply | Qty: 90 | Fill #0

## 2016-02-14 NOTE — Progress Notes (Signed)
Patient here for follow up on his diabetes and HTN Patient also requesting a referral to the dentist for a hole to his back molar

## 2016-02-14 NOTE — Progress Notes (Signed)
Patient ID: Raymond Ryan, male   DOB: 1966/11/14, 50 y.o.   MRN: [id number] SUBJECTIVE: 50 y.o. male for follow up of diabetes  Diabetic Review of Systems - medication compliance: noncompliant some of the time, diabetic diet compliance: noncompliant some of the time, home glucose monitoring: is performed regularly, further diabetic ROS: no polyuria or polydipsia, no chest pain, dyspnea or TIA's, no unusual visual symptoms, no hypoglycemia, has dysesthesias in the feet.  Other symptoms and concerns: Reports that he only received the Invokana 1 month ago. He states that he has been eating unhealthy for the past one month or so.  He takes his blood pressure medication daily with complications of headaches, chest pain, edema, or palpitations.  He would like a dental referral today because he never received a call after last referral. He has several cavities and has been having pain in his back upper and lower molars.   Current Outpatient Prescriptions  Medication Sig Dispense Refill  . atorvastatin (LIPITOR) 20 MG tablet Take 1 tablet (20 mg total) by mouth daily. 90 tablet 3  . canagliflozin (INVOKANA) 100 MG TABS tablet Take 1 tablet (100 mg total) by mouth daily before breakfast. 90 tablet 3  . gabapentin (NEURONTIN) 300 MG capsule Take 1 capsule (300 mg total) by mouth at bedtime as needed. 30 capsule 3  . glipiZIDE (GLUCOTROL) 10 MG tablet Take 2 tablets (20 mg total) by mouth 2 (two) times daily before a meal. 120 tablet 3  . lisinopril-hydrochlorothiazide (PRINZIDE,ZESTORETIC) 20-12.5 MG tablet Take 1 tablet by mouth daily. 30 tablet 3  . metFORMIN (GLUCOPHAGE XR) 500 MG 24 hr tablet Take 2 tablets (1,000 mg total) by mouth 2 (two) times daily with a meal. 120 tablet 4  . cephALEXin (KEFLEX) 500 MG capsule Take 1 capsule (500 mg total) by mouth 4 (four) times daily. (Patient not taking: Reported on 11/15/2015) 40 capsule 0  . cetirizine (ZYRTEC) 10 MG tablet Take 1 tablet (10 mg total) by  mouth daily. (Patient not taking: Reported on 11/15/2015) 30 tablet 3  . magnesium hydroxide (MILK OF MAGNESIA) 400 MG/5ML suspension Take 15 mLs by mouth daily as needed for mild constipation. (Patient not taking: Reported on 11/15/2015) 360 mL 0  . oxyCODONE-acetaminophen (PERCOCET/ROXICET) 5-325 MG tablet Take 2 tablets by mouth every 4 (four) hours as needed for severe pain. 10 tablet 0   No current facility-administered medications for this visit.  Review of Systems: Other than what is stated in HPI, all other systems are negative.   OBJECTIVE: Appearance: alert, well appearing, and in no distress, oriented to person, place, and time and overweight. BP 126/86 mmHg  Pulse 85  Temp(Src) 98 F (36.7 C)  Resp 16  Ht 6\' 3"  (1.905 m)  Wt 273 lb (123.832 kg)  BMI 34.12 kg/m2  SpO2 100%  Exam: heart sounds normal rate, regular rhythm, normal S1, S2, no murmurs, rubs, clicks or gallops, no JVD, chest clear, no carotid bruits, no edema. Dental caries.   ASSESSMENT: [name] was seen today for follow-up.  Diagnoses and all orders for this visit:  Type 2 diabetes mellitus without complication, without long-term current use of insulin (HCC) -     Glucose (CBG) -     HgB A1c -     canagliflozin (INVOKANA) 100 MG TABS tablet; Take 3 tablets (300 mg total) by mouth daily before breakfast. -     metFORMIN (GLUCOPHAGE XR) 500 MG 24 hr tablet; Take 2 tablets (1,000 mg total) by mouth  2 (two) times daily with a meal.  A1C has trended up. I will increase his Invokana and explained that he really needs to focus on following ADA diet recommendations and exercising to lose weight. Explained that diet is a large part of diabetes which helps significantly. Goal A1C for patient is 7%.   Essential hypertension -     lisinopril-hydrochlorothiazide (PRINZIDE,ZESTORETIC) 20-12.5 MG tablet; Take 1 tablet by mouth daily. -     COMPLETE METABOLIC PANEL WITH GFR Patient blood pressure is stable and may continue on  current medication.  Education on diet, exercise, and modifiable risk factors discussed. Will obtain appropriate labs as needed. Will follow up in 3-6 months.   HLD (hyperlipidemia) -     atorvastatin (LIPITOR) 20 MG tablet; Take 1 tablet (20 mg total) by mouth daily. -     Lipid panel Education provided on proper lifestyle changes in order to lower cholesterol. Patient advised to maintain healthy weight and to keep total fat intake at 25-35% of total calories and carbohydrates 50-60% of total daily calories. Explained how high cholesterol places patient at risk for heart disease. Patient placed on appropriate medication and repeat labs in 6 months   Pain, dental -     Ambulatory referral to Dentistry I will place referral again. Explained that he may call number on back of his orange card.   PLAN: See orders for this visit as documented in the electronic medical record. Issues reviewed with him: diabetic diet discussed in detail, written exchange diet given, low cholesterol diet, weight control and daily exercise discussed, foot care discussed and Podiatry visits discussed, annual eye examinations at Ophthalmology discussed and long term diabetic complications discussed.   Return in about 3 months (around 05/13/2016) for DM/HTN.   Lance Bosch, NP 02/14/2016 2:36 PM

## 2016-02-19 ENCOUNTER — Other Ambulatory Visit: Payer: Self-pay | Admitting: Internal Medicine

## 2016-02-19 ENCOUNTER — Telehealth: Payer: Self-pay

## 2016-02-19 MED ORDER — ATORVASTATIN CALCIUM 20 MG PO TABS: 20.0000 mg | ORAL_TABLET | Freq: Every day | ORAL | Status: DC

## 2016-02-19 NOTE — Telephone Encounter (Signed)
Tried to call patient about his labs Patient not available Message left on voice mail to return our call

## 2016-02-19 NOTE — Telephone Encounter (Signed)
-----   Message from Lance Bosch, NP sent at 02/19/2016  8:40 AM EST ----- Cholesterol is really elevated. Please go over things that increase cholesterol levels such as breads pasta, rice, butters, fried foods, etc. Please send Lipitor 20 mg to take every evening with dinner. Please explain that high cholesterol places him at risk for stroke and heart disease. Patient's kidneys are gradually worsening. It is very important that he maintains tight control over sugars and blood pressure to prevent further damage. If he does not have improvement on repeat check in 3 months then I will likely send him for a nephrology consult.

## 2016-03-22 MED FILL — LISINOPRIL-HCTZ 20-12.5 MG: 20-12.5 | 30 days supply | Qty: 30 | Fill #2

## 2016-03-22 MED FILL — METFORMIN HCL ER 500 MG TAB: 500 | 30 days supply | Qty: 120 | Fill #2

## 2016-04-03 MED FILL — INVOKANA 100 MG TABLET: 100 | 30 days supply | Qty: 90 | Fill #1

## 2016-04-24 ENCOUNTER — Ambulatory Visit: Payer: Self-pay | Attending: Family Medicine | Admitting: Family Medicine

## 2016-04-24 ENCOUNTER — Encounter: Payer: Self-pay | Admitting: Family Medicine

## 2016-04-24 DIAGNOSIS — Z79899 Other long term (current) drug therapy: Secondary | ICD-10-CM | POA: Insufficient documentation

## 2016-04-24 DIAGNOSIS — E785 Hyperlipidemia, unspecified: Secondary | ICD-10-CM | POA: Insufficient documentation

## 2016-04-24 DIAGNOSIS — E1149 Type 2 diabetes mellitus with other diabetic neurological complication: Secondary | ICD-10-CM

## 2016-04-24 DIAGNOSIS — E669 Obesity, unspecified: Secondary | ICD-10-CM | POA: Insufficient documentation

## 2016-04-24 DIAGNOSIS — R200 Anesthesia of skin: Secondary | ICD-10-CM | POA: Insufficient documentation

## 2016-04-24 DIAGNOSIS — E1140 Type 2 diabetes mellitus with diabetic neuropathy, unspecified: Secondary | ICD-10-CM | POA: Insufficient documentation

## 2016-04-24 DIAGNOSIS — I10 Essential (primary) hypertension: Secondary | ICD-10-CM | POA: Insufficient documentation

## 2016-04-24 DIAGNOSIS — Z6833 Body mass index (BMI) 33.0-33.9, adult: Secondary | ICD-10-CM | POA: Insufficient documentation

## 2016-04-24 DIAGNOSIS — Z7984 Long term (current) use of oral hypoglycemic drugs: Secondary | ICD-10-CM | POA: Insufficient documentation

## 2016-04-24 LAB — GLUCOSE, POCT (MANUAL RESULT ENTRY): POC Glucose: 84 mg/dl (ref 70–99)

## 2016-04-24 MED ORDER — LISINOPRIL-HYDROCHLOROTHIAZIDE 20-12.5 MG PO TABS: 1.0000 | ORAL_TABLET | Freq: Every day | ORAL | Status: DC

## 2016-04-24 MED ORDER — GLIPIZIDE 10 MG PO TABS: 20.0000 mg | ORAL_TABLET | Freq: Two times a day (BID) | ORAL | Status: DC

## 2016-04-24 MED ORDER — METFORMIN HCL ER 500 MG PO TB24: 1000.0000 mg | ORAL_TABLET | Freq: Two times a day (BID) | ORAL | Status: DC

## 2016-04-24 MED ORDER — ATORVASTATIN CALCIUM 20 MG PO TABS: 20.0000 mg | ORAL_TABLET | Freq: Every day | ORAL | Status: DC

## 2016-04-24 MED ORDER — GABAPENTIN 300 MG PO CAPS: 300.0000 mg | ORAL_CAPSULE | Freq: Every evening | ORAL | Status: DC | PRN

## 2016-04-24 NOTE — Progress Notes (Signed)
Patient's here for f/up DM and HTN and to re-establish care with PCP.  Patient denies any pain today.  Requesting med refills atorvastatin,gabapentin, glipizide, lisinopril-hydrocholorthiazide, metformin.

## 2016-04-24 NOTE — Progress Notes (Signed)
Subjective:  Patient ID: Raymond Ryan, male    DOB: 1966-08-11  Age: 50 y.o. MRN: [id number]  CC: Follow-up; Diabetes; and Hypertension   HPI Raymond Ryan is a 50 year old male with history of type 2 diabetes mellitus (A1c 8.6 from 01/2015), diabetic neuropathy, hypertension, hyperlipidemia who comes into the clinic for a follow-up visit.  He has been compliant with his medications and had an annual eye exam last month. He reports his fasting blood sugars have been in the 80-110 range and denies any random sugars above 200. He suffers from diabetic neuropathy and does have some numbness in his feet; he also has Charcot joint.  He is able to tolerate his antihypertensives and statin and has no complaints at this time. He is hoping to obtain insurance sometime in August through his job but as of now remains on the [place] card which she says will expire next month and I have advised him to renew it.  Outpatient Prescriptions Prior to Visit  Medication Sig Dispense Refill  . canagliflozin (INVOKANA) 100 MG TABS tablet Take 3 tablets (300 mg total) by mouth daily before breakfast. 90 tablet 3  . oxyCODONE-acetaminophen (PERCOCET/ROXICET) 5-325 MG tablet Take 2 tablets by mouth every 4 (four) hours as needed for severe pain. 10 tablet 0  . atorvastatin (LIPITOR) 20 MG tablet Take 1 tablet (20 mg total) by mouth daily. 90 tablet 3  . gabapentin (NEURONTIN) 300 MG capsule Take 1 capsule (300 mg total) by mouth at bedtime as needed. 30 capsule 3  . glipiZIDE (GLUCOTROL) 10 MG tablet Take 2 tablets (20 mg total) by mouth 2 (two) times daily before a meal. 120 tablet 3  . lisinopril-hydrochlorothiazide (PRINZIDE,ZESTORETIC) 20-12.5 MG tablet Take 1 tablet by mouth daily. 30 tablet 3  . metFORMIN (GLUCOPHAGE XR) 500 MG 24 hr tablet Take 2 tablets (1,000 mg total) by mouth 2 (two) times daily with a meal. 120 tablet 4  . cetirizine (ZYRTEC) 10 MG tablet Take 1 tablet (10 mg total) by mouth  daily. (Patient not taking: Reported on 11/15/2015) 30 tablet 3  . magnesium hydroxide (MILK OF MAGNESIA) 400 MG/5ML suspension Take 15 mLs by mouth daily as needed for mild constipation. (Patient not taking: Reported on 11/15/2015) 360 mL 0  . atorvastatin (LIPITOR) 20 MG tablet Take 1 tablet (20 mg total) by mouth daily. (Patient not taking: Reported on 04/24/2016) 90 tablet 3   No facility-administered medications prior to visit.    ROS Review of Systems  Constitutional: Negative for activity change and appetite change.  HENT: Negative for sinus pressure and sore throat.   Eyes: Negative for visual disturbance.  Respiratory: Negative for cough, chest tightness and shortness of breath.   Cardiovascular: Negative for chest pain and leg swelling.  Gastrointestinal: Negative for abdominal pain, diarrhea, constipation and abdominal distention.  Endocrine: Negative.   Genitourinary: Negative for dysuria.  Musculoskeletal: Negative for myalgias and joint swelling.  Skin: Negative for rash.  Allergic/Immunologic: Negative.   Neurological: Positive for numbness. Negative for weakness and light-headedness.  Psychiatric/Behavioral: Negative for suicidal ideas and dysphoric mood.    Objective:  BP 123/85 mmHg  Pulse 81  Temp(Src) 98.4 F (36.9 C) (Oral)  Resp 16  Ht 6\' 3"  (1.905 m)  Wt 266 lb 12.8 oz (121.02 kg)  BMI 33.35 kg/m2  SpO2 98%  BP/Weight 04/24/2016 02/14/2016 [id number]  Systolic BP [id number]  Diastolic BP 85 86 65  Wt. (Lbs) 266.8 273 268  BMI 33.35 34.12  33.5      Physical Exam  Constitutional: He is oriented to person, place, and time. He appears well-developed and well-nourished.  Cardiovascular: Normal rate, normal heart sounds and intact distal pulses.   No murmur heard. Pulmonary/Chest: Effort normal and breath sounds normal. He has no wheezes. He has no rales. He exhibits no tenderness.  Abdominal: Soft. Bowel sounds are normal. He exhibits no distension and  no mass. There is no tenderness.  Musculoskeletal: Normal range of motion.  Neurological: He is alert and oriented to person, place, and time.  Skin: Skin is warm and dry.  Psychiatric: He has a normal mood and affect.    Lab Results  Component Value Date   HGBA1C 8.60 02/14/2016    CMP Latest Ref Rng 02/14/2016 07/24/2015 04/05/2015  Glucose 65 - 99 mg/dL 160(H) 207(H) 293(H)  BUN 7 - 25 mg/dL 17 24 21   Creatinine 0.60 - 1.35 mg/dL 1.61(H) 1.53(H) 1.38(H)  Sodium 135 - 146 mmol/L 137 140 136  Potassium 3.5 - 5.3 mmol/L 4.7 5.2 5.0  Chloride 98 - 110 mmol/L 99 101 97  CO2 20 - 31 mmol/L 26 26 24   Calcium 8.6 - 10.3 mg/dL 10.3 10.3 10.1  Total Protein 6.1 - 8.1 g/dL 7.8 8.0 8.2  Total Bilirubin 0.2 - 1.2 mg/dL 0.7 0.5 0.6  Alkaline Phos 40 - 115 U/L 75 80 85  AST 10 - 40 U/L 12 15 13   ALT 9 - 46 U/L 13 17 18      Lipid Panel     Component Value Date/Time   CHOL 196 02/14/2016 1005   TRIG 178* 02/14/2016 1005   HDL 30* 02/14/2016 1005   CHOLHDL 6.5* 02/14/2016 1005   VLDL 36* 02/14/2016 1005   LDLCALC 130* 02/14/2016 1005      Assessment & Plan:   1. Essential hypertension Controlled - lisinopril-hydrochlorothiazide (PRINZIDE,ZESTORETIC) 20-12.5 MG tablet; Take 1 tablet by mouth daily.  Dispense: 30 tablet; Refill: 3  2. Other diabetic neurological complication associated with type 2 diabetes mellitus (HCC) A1c of 8.6. Blood sugar log reveals some improvement and so I will not make any regimen changes today. - Microalbumin/Creatinine Ratio, Urine - Glucose (CBG) - gabapentin (NEURONTIN) 300 MG capsule; Take 1 capsule (300 mg total) by mouth at bedtime as needed.  Dispense: 30 capsule; Refill: 3 - metFORMIN (GLUCOPHAGE XR) 500 MG 24 hr tablet; Take 2 tablets (1,000 mg total) by mouth 2 (two) times daily with a meal.  Dispense: 120 tablet; Refill: 4 - glipiZIDE (GLUCOTROL) 10 MG tablet; Take 2 tablets (20 mg total) by mouth 2 (two) times daily before a meal.  Dispense:  120 tablet; Refill: 3  3. Hyperlipidemia LDL is 1:30 which is above goal of less than 100 We'll repeat labs at next office visit. - atorvastatin (LIPITOR) 20 MG tablet; Take 1 tablet (20 mg total) by mouth daily.  Dispense: 30 tablet; Refill: 3  4. Obesity Discussed weight loss options including reducing portion sizes, increase in physical activity.   Meds ordered this encounter  Medications  . gabapentin (NEURONTIN) 300 MG capsule    Sig: Take 1 capsule (300 mg total) by mouth at bedtime as needed.    Dispense:  30 capsule    Refill:  3  . lisinopril-hydrochlorothiazide (PRINZIDE,ZESTORETIC) 20-12.5 MG tablet    Sig: Take 1 tablet by mouth daily.    Dispense:  30 tablet    Refill:  3  . metFORMIN (GLUCOPHAGE XR) 500 MG 24 hr tablet  Sig: Take 2 tablets (1,000 mg total) by mouth 2 (two) times daily with a meal.    Dispense:  120 tablet    Refill:  4  . atorvastatin (LIPITOR) 20 MG tablet    Sig: Take 1 tablet (20 mg total) by mouth daily.    Dispense:  30 tablet    Refill:  3  . glipiZIDE (GLUCOTROL) 10 MG tablet    Sig: Take 2 tablets (20 mg total) by mouth 2 (two) times daily before a meal.    Dispense:  120 tablet    Refill:  3    Follow-up: 2 months follow up on Diabetes Mellitus   Arnoldo Morale MD

## 2016-04-25 ENCOUNTER — Encounter: Payer: Self-pay | Admitting: Clinical

## 2016-04-25 LAB — MICROALBUMIN / CREATININE URINE RATIO
CREATININE, URINE: 174 mg/dL (ref 20–370)
MICROALB UR: 1.4 mg/dL
Microalb Creat Ratio: 8 mcg/mg creat (ref <30)

## 2016-04-25 NOTE — Progress Notes (Signed)
Depression screen [facility] 2/9 04/24/2016 02/14/2016 11/15/2015 07/12/2015 06/25/2013  Decreased Interest 0 0 0 0 0  Down, Depressed, Hopeless 0 0 0 0 0  PHQ - 2 Score 0 0 0 0 0  Altered sleeping 2 - - - -  Tired, decreased energy 2 - - - -  Change in appetite 1 - - - -  Feeling bad or failure about yourself  0 - - - -  Trouble concentrating 0 - - - -  Moving slowly or fidgety/restless 0 - - - -  Suicidal thoughts 0 - - - -  PHQ-9 Score 5 - - - -    GAD 7 : Generalized Anxiety Score 04/24/2016 02/14/2016  Nervous, Anxious, on Edge 0 0  Control/stop worrying 0 0  Worry too much - different things 0 0  Trouble relaxing 0 1  Restless 0 0  Easily annoyed or irritable 0 0  Afraid - awful might happen 0 0  Total GAD 7 Score 0 1

## 2016-05-16 MED FILL — INVOKANA 100 MG TABLET: 100 | 30 days supply | Qty: 90 | Fill #2

## 2016-05-29 MED FILL — LISINOPRIL-HCTZ 20-12.5 MG: 20-12.5 | 30 days supply | Qty: 30 | Fill #3

## 2016-05-29 MED FILL — glipiZIDE 10 MG TABS: 10 | 30 days supply | Qty: 120 | Fill #0

## 2016-05-29 MED FILL — METFORMIN HCL ER 500 MG TAB: 500 | 30 days supply | Qty: 120 | Fill #0

## 2016-07-01 ENCOUNTER — Other Ambulatory Visit: Payer: Self-pay | Admitting: Internal Medicine

## 2016-07-01 MED FILL — INVOKANA 100 MG TABLET: 100 | 30 days supply | Qty: 90 | Fill #3

## 2016-07-03 MED FILL — LISINOPRIL-HCTZ 20-12.5 MG: 20-12.5 | 30 days supply | Qty: 30 | Fill #0

## 2016-08-02 ENCOUNTER — Ambulatory Visit: Payer: Self-pay | Attending: Internal Medicine

## 2016-08-02 MED FILL — INVOKANA 100 MG TABLET: 100 | 30 days supply | Qty: 30 | Fill #0

## 2016-08-02 MED FILL — glipiZIDE 10 MG TABS: 10 | 30 days supply | Qty: 120 | Fill #1

## 2016-08-02 MED FILL — METFORMIN HCL ER 500 MG TAB: 500 | 30 days supply | Qty: 120 | Fill #1

## 2016-08-02 MED FILL — LISINOPRIL-HCTZ 20-12.5 MG: 20-12.5 | 30 days supply | Qty: 30 | Fill #1

## 2016-08-19 ENCOUNTER — Ambulatory Visit: Payer: Self-pay | Attending: Family Medicine | Admitting: Family Medicine

## 2016-08-19 ENCOUNTER — Encounter: Payer: Self-pay | Admitting: Family Medicine

## 2016-08-19 VITALS — BP 93/60 | HR 96 | Temp 98.6°F | Ht 76.0 in | Wt 265.6 lb

## 2016-08-19 DIAGNOSIS — E119 Type 2 diabetes mellitus without complications: Secondary | ICD-10-CM

## 2016-08-19 DIAGNOSIS — N179 Acute kidney failure, unspecified: Secondary | ICD-10-CM | POA: Insufficient documentation

## 2016-08-19 DIAGNOSIS — E785 Hyperlipidemia, unspecified: Secondary | ICD-10-CM | POA: Insufficient documentation

## 2016-08-19 DIAGNOSIS — E1140 Type 2 diabetes mellitus with diabetic neuropathy, unspecified: Secondary | ICD-10-CM | POA: Insufficient documentation

## 2016-08-19 DIAGNOSIS — Z7984 Long term (current) use of oral hypoglycemic drugs: Secondary | ICD-10-CM | POA: Insufficient documentation

## 2016-08-19 DIAGNOSIS — E089 Diabetes mellitus due to underlying condition without complications: Secondary | ICD-10-CM

## 2016-08-19 DIAGNOSIS — Z79899 Other long term (current) drug therapy: Secondary | ICD-10-CM | POA: Insufficient documentation

## 2016-08-19 DIAGNOSIS — I10 Essential (primary) hypertension: Secondary | ICD-10-CM | POA: Insufficient documentation

## 2016-08-19 DIAGNOSIS — E1149 Type 2 diabetes mellitus with other diabetic neurological complication: Secondary | ICD-10-CM

## 2016-08-19 DIAGNOSIS — E1169 Type 2 diabetes mellitus with other specified complication: Secondary | ICD-10-CM | POA: Insufficient documentation

## 2016-08-19 LAB — GLUCOSE, POCT (MANUAL RESULT ENTRY): POC GLUCOSE: 182 mg/dL — AB (ref 70–99)

## 2016-08-19 LAB — POCT GLYCOSYLATED HEMOGLOBIN (HGB A1C): HEMOGLOBIN A1C: 7.7

## 2016-08-19 MED ORDER — CANAGLIFLOZIN 100 MG PO TABS: 300.0000 mg | ORAL_TABLET | Freq: Every day | ORAL | 3 refills | Status: DC

## 2016-08-19 MED ORDER — GLIPIZIDE 10 MG PO TABS: 20.0000 mg | ORAL_TABLET | Freq: Two times a day (BID) | ORAL | 3 refills | Status: DC

## 2016-08-19 MED ORDER — LISINOPRIL-HYDROCHLOROTHIAZIDE 10-12.5 MG PO TABS: 1.0000 | ORAL_TABLET | Freq: Every day | ORAL | 3 refills | Status: DC

## 2016-08-19 MED ORDER — GABAPENTIN 300 MG PO CAPS: 300.0000 mg | ORAL_CAPSULE | Freq: Every evening | ORAL | 3 refills | Status: DC | PRN

## 2016-08-19 MED ORDER — ATORVASTATIN CALCIUM 20 MG PO TABS: 20.0000 mg | ORAL_TABLET | Freq: Every day | ORAL | 3 refills | Status: DC

## 2016-08-19 MED ORDER — METFORMIN HCL ER 500 MG PO TB24: 1000.0000 mg | ORAL_TABLET | Freq: Two times a day (BID) | ORAL | 4 refills | Status: DC

## 2016-08-19 MED FILL — ?ATORVASTATIN 20 MG TABLET: 20 | 30 days supply | Qty: 30 | Fill #0

## 2016-08-19 NOTE — Progress Notes (Signed)
Wants to discuss stopping lisinopril Blood in stool last week Headaches recently

## 2016-08-19 NOTE — Progress Notes (Signed)
Subjective:  Patient ID: Raymond Ryan, male    DOB: February 12, 1966  Age: 50 y.o. MRN: [id number]  CC: Hypertension and Diabetes   HPI Raymond Ryan is a 50 year old male with history of type 2 diabetes mellitus (A1c 7.7), diabetic neuropathy, hypertension, hyperlipidemia who comes into the clinic for a follow-up visit.  He has been compliant with his medications and is up to date on annual eye exams. Denies hypoglycemic episodes. He suffers from diabetic neuropathy and does have some numbness in his feet; he also has Charcot joint.  He did have an episode of hematochezia but states this happened after he strained during his bowel movement. Also has intermittent headaches which are infrequent and resolved with the use of ibuprofen.  He is concerned about lisinopril and does not want to take it because he heard "it will affect his liver".  Past Medical History:  Diagnosis Date  . Charcot foot due to diabetes mellitus ([place])   . Diabetes mellitus   . Hypertension     History reviewed. No pertinent surgical history.  No Known Allergies   Outpatient Medications Prior to Visit  Medication Sig Dispense Refill  . atorvastatin (LIPITOR) 20 MG tablet Take 1 tablet (20 mg total) by mouth daily. 30 tablet 3  . canagliflozin (INVOKANA) 100 MG TABS tablet Take 3 tablets (300 mg total) by mouth daily before breakfast. 90 tablet 3  . gabapentin (NEURONTIN) 300 MG capsule Take 1 capsule (300 mg total) by mouth at bedtime as needed. 30 capsule 3  . glipiZIDE (GLUCOTROL) 10 MG tablet Take 2 tablets (20 mg total) by mouth 2 (two) times daily before a meal. 120 tablet 3  . lisinopril-hydrochlorothiazide (PRINZIDE,ZESTORETIC) 20-12.5 MG tablet TAKE 1 TABLET BY MOUTH DAILY. 30 tablet 2  . metFORMIN (GLUCOPHAGE XR) 500 MG 24 hr tablet Take 2 tablets (1,000 mg total) by mouth 2 (two) times daily with a meal. 120 tablet 4  . cetirizine (ZYRTEC) 10 MG tablet Take 1 tablet (10 mg total) by mouth  daily. (Patient not taking: Reported on 11/15/2015) 30 tablet 3  . magnesium hydroxide (MILK OF MAGNESIA) 400 MG/5ML suspension Take 15 mLs by mouth daily as needed for mild constipation. (Patient not taking: Reported on 11/15/2015) 360 mL 0  . oxyCODONE-acetaminophen (PERCOCET/ROXICET) 5-325 MG tablet Take 2 tablets by mouth every 4 (four) hours as needed for severe pain. (Patient not taking: Reported on 08/19/2016) 10 tablet 0   No facility-administered medications prior to visit.     ROS Review of Systems  Constitutional: Negative for activity change and appetite change.  HENT: Negative for sinus pressure and sore throat.   Eyes: Negative for visual disturbance.  Respiratory: Negative for cough, chest tightness and shortness of breath.   Cardiovascular: Negative for chest pain and leg swelling.  Gastrointestinal: Positive for blood in stool. Negative for abdominal distention, abdominal pain, constipation and diarrhea.  Endocrine: Negative.   Genitourinary: Negative for dysuria.  Musculoskeletal: Negative for joint swelling and myalgias.  Skin: Negative for rash.  Allergic/Immunologic: Negative.   Neurological: Positive for numbness and headaches. Negative for weakness and light-headedness.  Psychiatric/Behavioral: Negative for dysphoric mood and suicidal ideas.    Objective:  BP 93/60 (BP Location: Right Arm, Patient Position: Sitting, Cuff Size: Large)   Pulse 96   Temp 98.6 F (37 C) (Oral)   Ht 6\' 4"  (1.93 m)   Wt 265 lb 9.6 oz (120.5 kg)   SpO2 98%   BMI 32.33 kg/m   BP/Weight  08/19/2016 04/24/2016 [id number]  Systolic BP 93 [id number]  Diastolic BP 60 85 86  Wt. (Lbs) 265.6 266.8 273  BMI 32.33 33.35 34.12      Physical Exam  Constitutional: He is oriented to person, place, and time. He appears well-developed and well-nourished.  Cardiovascular: Normal rate, normal heart sounds and intact distal pulses.   No murmur heard. Pulmonary/Chest: Effort normal and breath  sounds normal. He has no wheezes. He has no rales. He exhibits no tenderness.  Abdominal: Soft. Bowel sounds are normal. He exhibits no distension and no mass. There is no tenderness.  Musculoskeletal: Normal range of motion.  Neurological: He is alert and oriented to person, place, and time.  Skin: Skin is warm and dry.  Psychiatric: He has a normal mood and affect.    Lab Results  Component Value Date   HGBA1C 7.7 08/19/2016    Assessment & Plan:   1. Diabetes mellitus due to underlying condition without complication, without long-term current use of insulin (HCC) A1c of 7.7 Improved from 8.6 previously Educated on compliance with diabetic diet and lifestyle modifications I have discussed the option of insulin which is not open to at this time. If renal function worsens I will have to discontinue metformin and Invokana - Glucose (CBG) - HgB A1c  2. Other diabetic neurological complication associated with type 2 diabetes mellitus (HCC) Controlled on gabapentin - metFORMIN (GLUCOPHAGE XR) 500 MG 24 hr tablet; Take 2 tablets (1,000 mg total) by mouth 2 (two) times daily with a meal.  Dispense: 120 tablet; Refill: 4 - glipiZIDE (GLUCOTROL) 10 MG tablet; Take 2 tablets (20 mg total) by mouth 2 (two) times daily before a meal.  Dispense: 120 tablet; Refill: 3 - gabapentin (NEURONTIN) 300 MG capsule; Take 1 capsule (300 mg total) by mouth at bedtime as needed.  Dispense: 30 capsule; Refill: 3  3. Type 2 diabetes mellitus without complication, without long-term current use of insulin (HCC) canagliflozin (INVOKANA) 100 MG TABS tablet; Take 3 tablets (300 mg total) by mouth daily before breakfast.  Dispense: 90 tablet; Refill: 3  4. Hyperlipidemia Controlled Discussed side effects of Lipitor - atorvastatin (LIPITOR) 20 MG tablet; Take 1 tablet (20 mg total) by mouth daily.  Dispense: 30 tablet; Refill: 3  5. Acute kidney injury ([place]) Advised against nephrotoxic agents We will evaluate  creatinine and determine if medication changes need to be made - COMPLETE METABOLIC PANEL WITH GFR  6. Essential hypertension He would like to stay on his current lisinopril/HCTZ after my discussion about the role of ACE inhibitor in renal protection Reduce the dose of lisinopril/HCTZ due to hypotension  Meds ordered this encounter  Medications  . metFORMIN (GLUCOPHAGE XR) 500 MG 24 hr tablet    Sig: Take 2 tablets (1,000 mg total) by mouth 2 (two) times daily with a meal.    Dispense:  120 tablet    Refill:  4  . glipiZIDE (GLUCOTROL) 10 MG tablet    Sig: Take 2 tablets (20 mg total) by mouth 2 (two) times daily before a meal.    Dispense:  120 tablet    Refill:  3  . gabapentin (NEURONTIN) 300 MG capsule    Sig: Take 1 capsule (300 mg total) by mouth at bedtime as needed.    Dispense:  30 capsule    Refill:  3  . canagliflozin (INVOKANA) 100 MG TABS tablet    Sig: Take 3 tablets (300 mg total) by mouth daily before breakfast.  Dispense:  90 tablet    Refill:  3  . atorvastatin (LIPITOR) 20 MG tablet    Sig: Take 1 tablet (20 mg total) by mouth daily.    Dispense:  30 tablet    Refill:  3  . lisinopril-hydrochlorothiazide (PRINZIDE,ZESTORETIC) 10-12.5 MG tablet    Sig: Take 1 tablet by mouth daily.    Dispense:  30 tablet    Refill:  3    A great amount of time spent discussing all his medications including side effects; I discussed the risks versus benefits of his medications and cardiovascular risks that his diabetes mellitus places him at and he fully understands and is willing to remain on his medications at this time. I have assured him that at any time should he change his mind he should notify the clinic and I will change his medication as requested.  Follow-up: Return in about 3 months (around 11/19/2016) for follow up on Diabetes mellitus.   Arnoldo Morale MD

## 2016-08-20 LAB — COMPLETE METABOLIC PANEL WITH GFR
ALT: 12 U/L (ref 9–46)
AST: 10 U/L (ref 10–40)
Albumin: 4.7 g/dL (ref 3.6–5.1)
Alkaline Phosphatase: 65 U/L (ref 40–115)
BUN: 27 mg/dL — AB (ref 7–25)
CALCIUM: 10.1 mg/dL (ref 8.6–10.3)
CHLORIDE: 100 mmol/L (ref 98–110)
CO2: 25 mmol/L (ref 20–31)
CREATININE: 2.14 mg/dL — AB (ref 0.60–1.35)
GFR, Est African American: 41 mL/min — ABNORMAL LOW (ref >=60)
GFR, Est Non African American: 35 mL/min — ABNORMAL LOW (ref >=60)
Glucose, Bld: 160 mg/dL — ABNORMAL HIGH (ref 65–99)
Potassium: 4.4 mmol/L (ref 3.5–5.3)
Sodium: 139 mmol/L (ref 135–146)
Total Bilirubin: 0.5 mg/dL (ref 0.2–1.2)
Total Protein: 8.1 g/dL (ref 6.1–8.1)

## 2016-08-21 ENCOUNTER — Telehealth: Payer: Self-pay

## 2016-08-21 NOTE — Telephone Encounter (Signed)
Writer spoke with patient today regarding his creatinine level that is trending up.  Patient stated an understanding and will see MD tomorrow at 10:30 to discuss a possibility of a new medication regimen.

## 2016-08-21 NOTE — Telephone Encounter (Signed)
-----   Message from Arnoldo Morale, MD sent at 08/20/2016  5:18 PM EDT ----- Please schedule him an appointment for the next available slot on my schedule to discuss his lab results (which reveal creatinine trending up - inform the patient) and possible medication changes.

## 2016-08-22 ENCOUNTER — Ambulatory Visit: Payer: Self-pay | Attending: Family Medicine | Admitting: Family Medicine

## 2016-08-22 ENCOUNTER — Encounter: Payer: Self-pay | Admitting: Family Medicine

## 2016-08-22 VITALS — BP 104/68 | HR 98 | Temp 98.7°F | Ht 76.0 in | Wt 269.6 lb

## 2016-08-22 DIAGNOSIS — Z7984 Long term (current) use of oral hypoglycemic drugs: Secondary | ICD-10-CM | POA: Insufficient documentation

## 2016-08-22 DIAGNOSIS — E1122 Type 2 diabetes mellitus with diabetic chronic kidney disease: Secondary | ICD-10-CM | POA: Insufficient documentation

## 2016-08-22 DIAGNOSIS — I129 Hypertensive chronic kidney disease with stage 1 through stage 4 chronic kidney disease, or unspecified chronic kidney disease: Secondary | ICD-10-CM | POA: Insufficient documentation

## 2016-08-22 DIAGNOSIS — N183 Chronic kidney disease, stage 3 unspecified: Secondary | ICD-10-CM

## 2016-08-22 DIAGNOSIS — E1140 Type 2 diabetes mellitus with diabetic neuropathy, unspecified: Secondary | ICD-10-CM | POA: Insufficient documentation

## 2016-08-22 DIAGNOSIS — Z79899 Other long term (current) drug therapy: Secondary | ICD-10-CM | POA: Insufficient documentation

## 2016-08-22 MED ORDER — PIOGLITAZONE HCL 30 MG PO TABS
30.0000 mg | ORAL_TABLET | Freq: Every day | ORAL | 3 refills | Status: DC
Start: 2016-08-22 — End: 2017-01-22

## 2016-08-22 MED FILL — ?PIOGLITAZONE HCL 30 MG TAB: 30 | 30 days supply | Qty: 30 | Fill #0

## 2016-08-22 NOTE — Progress Notes (Signed)
Constant abdominal pain- not taking anything for acid reflux, had reaction to OTC prilosec (hives)

## 2016-08-22 NOTE — Progress Notes (Signed)
Subjective:  Patient ID: Raymond Ryan, male    DOB: 1966/10/31  Age: 50 y.o. MRN: [id number]  CC: Results (discuss lab results/medication changes); Diabetes; and Hypertension   HPI Raymond Ryan is a 50 year old male with a history of hypertension, type 2 diabetes mellitus (A1c 7.7) who was called into the clinic due to rising creatinine from 1.61 up to 2.14 in the last 6 months. He endorses using OTC NSAIDs intermittently. He has no other complaints today and remains on metformin and Invokana, for his diabetes and takes lisinopril/HCTZ for hypertension and is on gabapentin for diabetic neuropathy.  Past Medical History:  Diagnosis Date  . Charcot foot due to diabetes mellitus ([place])   . Diabetes mellitus   . Hypertension     History reviewed. No pertinent surgical history.  No Known Allergies   Outpatient Medications Prior to Visit  Medication Sig Dispense Refill  . atorvastatin (LIPITOR) 20 MG tablet Take 1 tablet (20 mg total) by mouth daily. 30 tablet 3  . gabapentin (NEURONTIN) 300 MG capsule Take 1 capsule (300 mg total) by mouth at bedtime as needed. 30 capsule 3  . glipiZIDE (GLUCOTROL) 10 MG tablet Take 2 tablets (20 mg total) by mouth 2 (two) times daily before a meal. 120 tablet 3  . lisinopril-hydrochlorothiazide (PRINZIDE,ZESTORETIC) 10-12.5 MG tablet Take 1 tablet by mouth daily. 30 tablet 3  . canagliflozin (INVOKANA) 100 MG TABS tablet Take 3 tablets (300 mg total) by mouth daily before breakfast. 90 tablet 3  . metFORMIN (GLUCOPHAGE XR) 500 MG 24 hr tablet Take 2 tablets (1,000 mg total) by mouth 2 (two) times daily with a meal. 120 tablet 4   No facility-administered medications prior to visit.     ROS Review of Systems  Constitutional: Negative for activity change and appetite change.  HENT: Negative for sinus pressure and sore throat.   Respiratory: Negative for chest tightness, shortness of breath and wheezing.   Cardiovascular: Negative for  chest pain and palpitations.  Gastrointestinal: Negative for abdominal distention, abdominal pain and constipation.  Genitourinary: Negative.   Musculoskeletal: Negative.   Psychiatric/Behavioral: Negative for behavioral problems and dysphoric mood.    Objective:  BP 104/68 (BP Location: Right Arm, Patient Position: Sitting, Cuff Size: Large)   Pulse 98   Temp 98.7 F (37.1 C) (Oral)   Ht 6\' 4"  (1.93 m)   Wt 269 lb 9.6 oz (122.3 kg)   SpO2 98%   BMI 32.82 kg/m   BP/Weight 08/22/2016 08/19/2016 [id number]  Systolic BP 123456 93 [id number]  Diastolic BP 68 60 85  Wt. (Lbs) 269.6 265.6 266.8  BMI 32.82 32.33 33.35      Physical Exam  Constitutional: He is oriented to person, place, and time. He appears well-developed and well-nourished.  Cardiovascular: Normal rate, normal heart sounds and intact distal pulses.   No murmur heard. Pulmonary/Chest: Effort normal and breath sounds normal. He has no wheezes. He has no rales. He exhibits no tenderness.  Abdominal: Soft. Bowel sounds are normal. He exhibits no distension and no mass. There is no tenderness.  Musculoskeletal: Normal range of motion.  Neurological: He is alert and oriented to person, place, and time.     CMP Latest Ref Rng & Units 08/19/2016 02/14/2016 07/24/2015  Glucose 65 - 99 mg/dL 160(H) 160(H) 207(H)  BUN 7 - 25 mg/dL 27(H) 17 24  Creatinine 0.60 - 1.35 mg/dL 2.14(H) 1.61(H) 1.53(H)  Sodium 135 - 146 mmol/L 139 137 140  Potassium 3.5 - 5.3  mmol/L 4.4 4.7 5.2  Chloride 98 - 110 mmol/L 100 99 101  CO2 20 - 31 mmol/L 25 26 26   Calcium 8.6 - 10.3 mg/dL 10.1 10.3 10.3  Total Protein 6.1 - 8.1 g/dL 8.1 7.8 8.0  Total Bilirubin 0.2 - 1.2 mg/dL 0.5 0.7 0.5  Alkaline Phos 40 - 115 U/L 65 75 80  AST 10 - 40 U/L 10 12 15   ALT 9 - 46 U/L 12 13 17   ' Assessment & Plan:   1. 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  Dispense: 30 tablet; Refill: 3  2.Chronic kidney disease Creatinine has trended up from 1.61 up to 2.14 in 6 months Avoid nephrotoxic agents DC Elocon on metformin We'll repeat creatinine at next visit  Meds ordered this encounter  Medications  . pioglitazone (ACTOS) 30 MG tablet    Sig: Take 1 tablet (30 mg total) by mouth daily.    Dispense:  30 tablet    Refill:  3    Discontinue metformin and Invokana    Follow-up: Return in about 1 month (around 09/22/2016) for Follow-up on diabetes mellitus.   Arnoldo Morale MD

## 2016-08-22 NOTE — Patient Instructions (Signed)
Discontinue metformin  Discontinue Invokana Commence Actos  Avoid naproxen, Aleve due to your renal impairment.  Return with your blood sugar log at your next visit for review

## 2016-09-03 MED FILL — LISINOPRIL-HCTZ 10-12.5 MG: 10-12.5 | 30 days supply | Qty: 30 | Fill #0

## 2016-09-23 ENCOUNTER — Ambulatory Visit: Payer: Self-pay | Attending: Family Medicine | Admitting: Family Medicine

## 2016-09-23 ENCOUNTER — Encounter: Payer: Self-pay | Admitting: Family Medicine

## 2016-09-23 VITALS — BP 126/77 | HR 84 | Temp 98.2°F | Ht 76.0 in | Wt 274.0 lb

## 2016-09-23 DIAGNOSIS — I129 Hypertensive chronic kidney disease with stage 1 through stage 4 chronic kidney disease, or unspecified chronic kidney disease: Secondary | ICD-10-CM | POA: Insufficient documentation

## 2016-09-23 DIAGNOSIS — R635 Abnormal weight gain: Secondary | ICD-10-CM

## 2016-09-23 DIAGNOSIS — E1122 Type 2 diabetes mellitus with diabetic chronic kidney disease: Secondary | ICD-10-CM

## 2016-09-23 DIAGNOSIS — Z7984 Long term (current) use of oral hypoglycemic drugs: Secondary | ICD-10-CM | POA: Insufficient documentation

## 2016-09-23 DIAGNOSIS — I10 Essential (primary) hypertension: Secondary | ICD-10-CM

## 2016-09-23 DIAGNOSIS — N183 Chronic kidney disease, stage 3 (moderate): Secondary | ICD-10-CM | POA: Insufficient documentation

## 2016-09-23 LAB — LIPID PANEL
CHOL/HDL RATIO: 3 ratio (ref <=5.0)
Cholesterol: 142 mg/dL (ref 125–200)
HDL: 47 mg/dL (ref >=40)
LDL CALC: 77 mg/dL (ref <130)
Triglycerides: 90 mg/dL (ref <150)
VLDL: 18 mg/dL (ref <30)

## 2016-09-23 LAB — COMPLETE METABOLIC PANEL WITH GFR
ALBUMIN: 4.5 g/dL (ref 3.6–5.1)
ALT: 12 U/L (ref 9–46)
AST: 13 U/L (ref 10–40)
Alkaline Phosphatase: 56 U/L (ref 40–115)
BUN: 20 mg/dL (ref 7–25)
CHLORIDE: 100 mmol/L (ref 98–110)
CO2: 27 mmol/L (ref 20–31)
CREATININE: 1.48 mg/dL — AB (ref 0.60–1.35)
Calcium: 10.1 mg/dL (ref 8.6–10.3)
GFR, Est African American: 63 mL/min (ref >=60)
GFR, Est Non African American: 55 mL/min — ABNORMAL LOW (ref >=60)
GLUCOSE: 66 mg/dL (ref 65–99)
POTASSIUM: 4.1 mmol/L (ref 3.5–5.3)
SODIUM: 140 mmol/L (ref 135–146)
Total Bilirubin: 0.5 mg/dL (ref 0.2–1.2)
Total Protein: 7.8 g/dL (ref 6.1–8.1)

## 2016-09-23 LAB — GLUCOSE, POCT (MANUAL RESULT ENTRY): POC GLUCOSE: 68 mg/dL — AB (ref 70–99)

## 2016-09-23 NOTE — Progress Notes (Signed)
Blood sugar was fasting today.

## 2016-09-23 NOTE — Patient Instructions (Signed)
Chronic Kidney Disease °Chronic kidney disease occurs when the kidneys are damaged over a long period. The kidneys are two organs that lie on either side of the spine between the middle of the back and the front of the abdomen. The kidneys: °· Remove wastes and extra water from the blood. °· Produce important hormones. These help keep bones strong, regulate blood pressure, and help create red blood cells. °· Balance the fluids and chemicals in the blood and tissues. °A small amount of kidney damage may not cause problems, but a large amount of damage may make it difficult or impossible for the kidneys to work the way they should. If steps are not taken to slow down the kidney damage or stop it from getting worse, the kidneys may stop working permanently. Most of the time, chronic kidney disease does not go away. However, it can often be controlled, and those with the disease can usually live normal lives. °CAUSES °The most common causes of chronic kidney disease are diabetes and high blood pressure (hypertension). Chronic kidney disease may also be caused by: °· Diseases that cause the kidneys' filters to become inflamed. °· Diseases that affect the immune system. °· Genetic diseases. °· Medicines that damage the kidneys, such as anti-inflammatory medicines. °· Poisoning or exposure to toxic substances. °· A reoccurring kidney or urinary infection. °· A problem with urine flow. This may be caused by: °¨ Cancer. °¨ Kidney stones. °¨ An enlarged prostate in males. °SIGNS AND SYMPTOMS °Because the kidney damage in chronic kidney disease occurs slowly, symptoms develop slowly and may not be obvious until the kidney damage becomes severe. A person may have a kidney disease for years without showing any symptoms. Symptoms can include: °· Swelling (edema) of the legs, ankles, or feet. °· Tiredness (lethargy). °· Nausea or vomiting. °· Confusion. °· Problems with urination, such as: °¨ Decreased urine  production. °¨ Frequent urination, especially at night. °¨ Frequent accidents in children who are potty trained. °· Muscle twitches and cramps. °· Shortness of breath. °· Weakness. °· Persistent itchiness. °· Loss of appetite. °· Metallic taste in the mouth. °· Trouble sleeping. °· Slowed development in children. °· Short stature in children. °DIAGNOSIS °Chronic kidney disease may be detected and diagnosed by tests, including blood, urine, imaging, or kidney biopsy tests. °TREATMENT °Most chronic kidney diseases cannot be cured. Treatment usually involves relieving symptoms and preventing or slowing the progression of the disease. Treatment may include: °· A special diet. You may need to avoid alcohol and foods that are salty and high in potassium. °· Medicines. These may: °¨ Lower blood pressure. °¨ Relieve anemia. °¨ Relieve swelling. °¨ Protect the bones. °HOME CARE INSTRUCTIONS °· Follow your prescribed diet.  Your health care provider may instruct you to limit daily salt (sodium) and protein intake. °· Take medicines only as directed by your health care provider. Do not take any new medicines (prescription, over-the-counter, or nutritional supplements) unless approved by your health care provider. Many medicines can worsen your kidney damage or need to have the dose adjusted.   °· Quit smoking if you smoke. Talk to your health care provider about a smoking cessation program. °· Keep all follow-up visits as directed by your health care provider. °· Monitor your blood pressure. °· Start or continue an exercise plan. °· Get immunizations as directed by your health care provider. °· Take vitamin and mineral supplements as directed by your health care provider. °SEEK IMMEDIATE MEDICAL CARE IF: °· Your symptoms get worse or you develop   new symptoms. °· You develop symptoms of end-stage kidney disease. These include: °¨ Headaches. °¨ Abnormally dark or light skin. °¨ Numbness in the hands or feet. °¨ Easy  bruising. °¨ Frequent hiccups. °¨ Menstruation stops. °· You have a fever. °· You have decreased urine production. °· You have pain or bleeding when urinating. °MAKE SURE YOU: °· Understand these instructions. °· Will watch your condition. °· Will get help right away if you are not doing well or get worse. °FOR MORE INFORMATION  °· American Association of Kidney Patients: www.aakp.org °· National Kidney Foundation: www.kidney.org °· American Kidney Fund: www.akfinc.org °· [employer] [facility]: www.lifeoptions.org and www.kidneyschool.org °  °This information is not intended to replace advice given to you by your health care provider. Make sure you discuss any questions you have with your health care provider. °  °Document Released: 09/24/2008 Document Revised: 01/06/2015 Document Reviewed: 08/14/2012 °Elsevier Interactive Patient Education ©2016 Elsevier Inc. ° °

## 2016-09-23 NOTE — Progress Notes (Signed)
Subjective:  Patient ID: Raymond Ryan, male    DOB: 1966-11-23  Age: 50 y.o. MRN: [id number]  CC: Diabetes and Hypertension   HPI Raymond Ryan  is a 50 year old male with a history of hypertension, type 2 diabetes mellitus (A1c 7.7), Stage III chronic kidney disease who had a rising creatinine from 1.61 up to 2.14 in the last 6 months. Metformin and Invokana were discontinued at his last visit last month and he was commenced on Actos; fasting blood sugars today revealed numbers between 116 and 150 and he denies hypoglycemic episodes. 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  He has no complaints today  Of note he has gained 6 pounds since his last visit one month ago.  Past Medical History:  Diagnosis Date  . Charcot foot due to diabetes mellitus ([place])   . Diabetes mellitus   . Hypertension     History reviewed. No pertinent surgical history.  No Known Allergies  Outpatient Medications Prior to Visit  Medication Sig Dispense Refill  . atorvastatin (LIPITOR) 20 MG tablet Take 1 tablet (20 mg total) by mouth daily. 30 tablet 3  . gabapentin (NEURONTIN) 300 MG capsule Take 1 capsule (300 mg total) by mouth at bedtime as needed. 30 capsule 3  . glipiZIDE (GLUCOTROL) 10 MG tablet Take 2 tablets (20 mg total) by mouth 2 (two) times daily before a meal. 120 tablet 3  . lisinopril-hydrochlorothiazide (PRINZIDE,ZESTORETIC) 10-12.5 MG tablet Take 1 tablet by mouth daily. 30 tablet 3  . pioglitazone (ACTOS) 30 MG tablet Take 1 tablet (30 mg total) by mouth daily. 30 tablet 3   No facility-administered medications prior to visit.     ROS Review of Systems  Constitutional: Negative for activity change and appetite change.  HENT: Negative for sinus pressure and sore throat.   Respiratory: Negative for chest tightness, shortness of breath and wheezing.   Cardiovascular: Negative for chest pain and palpitations.  Gastrointestinal: Negative for abdominal  distention, abdominal pain and constipation.  Genitourinary: Negative.   Musculoskeletal: Negative.   Psychiatric/Behavioral: Negative for behavioral problems and dysphoric mood.    Objective:  BP 126/77 (BP Location: Right Arm, Patient Position: Sitting, Cuff Size: Large)   Pulse 84   Temp 98.2 F (36.8 C) (Oral)   Ht 6\' 4"  (1.93 m)   Wt 274 lb (124.3 kg)   SpO2 98%   BMI 33.35 kg/m   BP/Weight 09/23/2016 08/22/2016 [id number]  Systolic BP 123XX123 123456 93  Diastolic BP 77 68 60  Wt. (Lbs) 274 269.6 265.6  BMI 33.35 32.82 32.33      Physical Exam  Constitutional: He is oriented to person, place, and time. He appears well-developed and well-nourished.  Cardiovascular: Normal rate, normal heart sounds and intact distal pulses.   No murmur heard. Pulmonary/Chest: Effort normal and breath sounds normal. He has no wheezes. He has no rales. He exhibits no tenderness.  Abdominal: Soft. Bowel sounds are normal. He exhibits no distension and no mass. There is no tenderness.  Musculoskeletal: Normal range of motion.  Neurological: He is alert and oriented to person, place, and time.    CMP Latest Ref Rng & Units 08/19/2016 02/14/2016 07/24/2015  Glucose 65 - 99 mg/dL 160(H) 160(H) 207(H)  BUN 7 - 25 mg/dL 27(H) 17 24  Creatinine 0.60 - 1.35 mg/dL 2.14(H) 1.61(H) 1.53(H)  Sodium 135 - 146 mmol/L 139 137 140  Potassium 3.5 - 5.3 mmol/L 4.4 4.7 5.2  Chloride 98 -  110 mmol/L 100 99 101  CO2 20 - 31 mmol/L 25 26 26   Calcium 8.6 - 10.3 mg/dL 10.1 10.3 10.3  Total Protein 6.1 - 8.1 g/dL 8.1 7.8 8.0  Total Bilirubin 0.2 - 1.2 mg/dL 0.5 0.7 0.5  Alkaline Phos 40 - 115 U/L 65 75 80  AST 10 - 40 U/L 10 12 15   ALT 9 - 46 U/L 12 13 17     Assessment & Plan:   1. Type 2 diabetes mellitus with stage 3 chronic kidney disease, without long-term current use of insulin (HCC) A1c of 7.7 Blood sugar log reveals control.  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 Essential hypertension Controlled  3. Weight gain Gained 6 lbs since the last visit Likely fluid retention from actos Encouraged exercise, reducing portion sizes. If weight continues to trend upward level increased his hydrochlorothiazide.  No orders of the defined types were placed in this encounter.   Follow-up: Return in about 2 months (around 11/23/2016) for follow up on Diabetes Mellitus.   Arnoldo Morale MD

## 2016-09-26 ENCOUNTER — Telehealth: Payer: Self-pay

## 2016-09-26 MED FILL — glipiZIDE 10 MG TABS: 10 | 30 days supply | Qty: 120 | Fill #2

## 2016-09-26 MED FILL — ?PIOGLITAZONE HCL 30 MG TAB: 30 | 30 days supply | Qty: 30 | Fill #1

## 2016-09-26 NOTE — Telephone Encounter (Signed)
-----   Message from Arnoldo Morale, MD sent at 09/24/2016  9:21 AM EDT ----- He has had significant improvement in kidney function which is back to close to baseline

## 2016-09-26 NOTE — Telephone Encounter (Signed)
Patient notified of lab resulrs per Dr. Jarold Song.  Patient very happy with outcome.

## 2016-10-02 MED FILL — LISINOPRIL-HCTZ 10-12.5 MG: 10-12.5 | 30 days supply | Qty: 30 | Fill #1

## 2016-10-29 MED FILL — PIOGLITAZONE HCL 30 MG TAB: 30 | 30 days supply | Qty: 30 | Fill #2

## 2016-10-29 MED FILL — LISINOPRIL-HCTZ 10-12.5 MG: 10-12.5 | 30 days supply | Qty: 30 | Fill #2

## 2016-11-28 MED FILL — LISINOPRIL-HCTZ 10-12.5 MG: 10-12.5 | 30 days supply | Qty: 30 | Fill #3

## 2016-11-28 MED FILL — glipiZIDE 10 MG TABS: 10 | 30 days supply | Qty: 120 | Fill #3

## 2016-12-06 MED FILL — PIOGLITAZONE HCL 15 MG TAB: 15 | 30 days supply | Qty: 60 | Fill #0

## 2016-12-25 ENCOUNTER — Other Ambulatory Visit: Payer: Self-pay | Admitting: Family Medicine

## 2016-12-25 MED FILL — LISINOPRIL-HCTZ 20-12.5 MG: 20-12.5 | 30 days supply | Qty: 30 | Fill #2

## 2016-12-25 MED FILL — glipiZIDE 10 MG TABS: 10 | 30 days supply | Qty: 120 | Fill #0

## 2017-01-06 ENCOUNTER — Other Ambulatory Visit: Payer: Self-pay | Admitting: Family Medicine

## 2017-01-06 MED FILL — PIOGLITAZONE HCL 15 MG TAB: 15 | 30 days supply | Qty: 60 | Fill #0

## 2017-01-18 ENCOUNTER — Emergency Department (HOSPITAL_COMMUNITY): Payer: No Typology Code available for payment source

## 2017-01-18 ENCOUNTER — Emergency Department (HOSPITAL_COMMUNITY)
Admission: EM | Admit: 2017-01-18 | Discharge: 2017-01-18 | Disposition: A | Discharge status: Home / Self Care | Payer: No Typology Code available for payment source | Attending: Emergency Medicine | Admitting: Emergency Medicine

## 2017-01-18 ENCOUNTER — Encounter (HOSPITAL_COMMUNITY): Payer: Self-pay

## 2017-01-18 DIAGNOSIS — Y939 Activity, unspecified: Secondary | ICD-10-CM | POA: Diagnosis not present

## 2017-01-18 DIAGNOSIS — E1140 Type 2 diabetes mellitus with diabetic neuropathy, unspecified: Secondary | ICD-10-CM | POA: Insufficient documentation

## 2017-01-18 DIAGNOSIS — M546 Pain in thoracic spine: Secondary | ICD-10-CM | POA: Diagnosis not present

## 2017-01-18 DIAGNOSIS — N189 Chronic kidney disease, unspecified: Secondary | ICD-10-CM | POA: Diagnosis not present

## 2017-01-18 DIAGNOSIS — I129 Hypertensive chronic kidney disease with stage 1 through stage 4 chronic kidney disease, or unspecified chronic kidney disease: Secondary | ICD-10-CM | POA: Insufficient documentation

## 2017-01-18 DIAGNOSIS — Z7984 Long term (current) use of oral hypoglycemic drugs: Secondary | ICD-10-CM | POA: Diagnosis not present

## 2017-01-18 DIAGNOSIS — E1122 Type 2 diabetes mellitus with diabetic chronic kidney disease: Secondary | ICD-10-CM | POA: Insufficient documentation

## 2017-01-18 DIAGNOSIS — Y999 Unspecified external cause status: Secondary | ICD-10-CM | POA: Diagnosis not present

## 2017-01-18 DIAGNOSIS — Z79899 Other long term (current) drug therapy: Secondary | ICD-10-CM | POA: Insufficient documentation

## 2017-01-18 DIAGNOSIS — S161XXA Strain of muscle, fascia and tendon at neck level, initial encounter: Secondary | ICD-10-CM | POA: Diagnosis not present

## 2017-01-18 DIAGNOSIS — Y92410 Unspecified street and highway as the place of occurrence of the external cause: Secondary | ICD-10-CM | POA: Insufficient documentation

## 2017-01-18 DIAGNOSIS — S199XXA Unspecified injury of neck, initial encounter: Secondary | ICD-10-CM | POA: Diagnosis present

## 2017-01-18 MED ORDER — ACETAMINOPHEN 500 MG PO TABS
1000.0000 mg | ORAL_TABLET | Freq: Once | ORAL | Status: AC
  Administered 2017-01-18: 1000 mg via ORAL
  Filled 2017-01-18: qty 2

## 2017-01-18 MED ORDER — CYCLOBENZAPRINE HCL 10 MG PO TABS
10.0000 mg | ORAL_TABLET | Freq: Once | ORAL | Status: AC
  Administered 2017-01-18: 10 mg via ORAL
  Filled 2017-01-18: qty 1

## 2017-01-18 MED ORDER — CYCLOBENZAPRINE HCL 10 MG PO TABS: 10.0000 mg | ORAL_TABLET | Freq: Two times a day (BID) | ORAL | 0 refills | Status: DC | PRN
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
## 2017-01-18 NOTE — Discharge Instructions (Signed)
Your xrays are normal today.  Ice your back for 10-20 minutes three times daily.  Take Flexeril twice daily.  Do not drive while taking this medicine.  Take 1000 mg of Tylenol every 6 hours.  Follow up with your primary care physician in 1 week.  Return to the ED for any new or concerning symptoms.

## 2017-01-18 NOTE — ED Notes (Signed)
Patient d/c'd self care.  F/U and medications reviewed.  Patient verbalized understanding. 
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
## 2017-01-22 ENCOUNTER — Ambulatory Visit: Payer: No Typology Code available for payment source | Attending: Family Medicine | Admitting: Physician Assistant

## 2017-01-22 VITALS — BP 136/85 | HR 98 | Temp 98.6°F | Resp 18 | Ht 76.0 in | Wt 291.6 lb

## 2017-01-22 DIAGNOSIS — N183 Chronic kidney disease, stage 3 unspecified: Secondary | ICD-10-CM

## 2017-01-22 DIAGNOSIS — I10 Essential (primary) hypertension: Secondary | ICD-10-CM

## 2017-01-22 DIAGNOSIS — K921 Melena: Secondary | ICD-10-CM | POA: Diagnosis not present

## 2017-01-22 DIAGNOSIS — Z7984 Long term (current) use of oral hypoglycemic drugs: Secondary | ICD-10-CM | POA: Insufficient documentation

## 2017-01-22 DIAGNOSIS — Z79899 Other long term (current) drug therapy: Secondary | ICD-10-CM | POA: Insufficient documentation

## 2017-01-22 DIAGNOSIS — M62838 Other muscle spasm: Secondary | ICD-10-CM | POA: Diagnosis not present

## 2017-01-22 DIAGNOSIS — IMO0001 Reserved for inherently not codable concepts without codable children: Secondary | ICD-10-CM

## 2017-01-22 DIAGNOSIS — E1065 Type 1 diabetes mellitus with hyperglycemia: Secondary | ICD-10-CM | POA: Diagnosis not present

## 2017-01-22 DIAGNOSIS — S39012A Strain of muscle, fascia and tendon of lower back, initial encounter: Secondary | ICD-10-CM | POA: Diagnosis present

## 2017-01-22 DIAGNOSIS — E1149 Type 2 diabetes mellitus with other diabetic neurological complication: Secondary | ICD-10-CM

## 2017-01-22 DIAGNOSIS — E1122 Type 2 diabetes mellitus with diabetic chronic kidney disease: Secondary | ICD-10-CM

## 2017-01-22 LAB — COMPREHENSIVE METABOLIC PANEL
ALBUMIN: 4.1 g/dL (ref 3.6–5.1)
ALT: 11 U/L (ref 9–46)
AST: 14 U/L (ref 10–40)
Alkaline Phosphatase: 57 U/L (ref 40–115)
BUN: 15 mg/dL (ref 7–25)
CHLORIDE: 103 mmol/L (ref 98–110)
CO2: 29 mmol/L (ref 20–31)
Calcium: 10.3 mg/dL (ref 8.6–10.3)
Creat: 1.51 mg/dL — ABNORMAL HIGH (ref 0.60–1.35)
Glucose, Bld: 77 mg/dL (ref 65–99)
POTASSIUM: 3.8 mmol/L (ref 3.5–5.3)
Sodium: 143 mmol/L (ref 135–146)
TOTAL PROTEIN: 7.7 g/dL (ref 6.1–8.1)
Total Bilirubin: 0.7 mg/dL (ref 0.2–1.2)

## 2017-01-22 LAB — CBC WITH DIFFERENTIAL/PLATELET
BASOS PCT: 1 %
Basophils Absolute: 70 cells/uL (ref 0–200)
EOS ABS: 140 {cells}/uL (ref 15–500)
Eosinophils Relative: 2 %
HEMATOCRIT: 45.8 % (ref 38.5–50.0)
Hemoglobin: 15.5 g/dL (ref 13.2–17.1)
LYMPHS PCT: 33 %
Lymphs Abs: 2310 cells/uL (ref 850–3900)
MCH: 29.2 pg (ref 27.0–33.0)
MCHC: 33.8 g/dL (ref 32.0–36.0)
MCV: 86.4 fL (ref 80.0–100.0)
MONO ABS: 490 {cells}/uL (ref 200–950)
MPV: 9.2 fL (ref 7.5–12.5)
Monocytes Relative: 7 %
NEUTROS ABS: 3990 {cells}/uL (ref 1500–7800)
Neutrophils Relative %: 57 %
PLATELETS: 383 10*3/uL (ref 140–400)
RBC: 5.3 MIL/uL (ref 4.20–5.80)
RDW: 14.2 % (ref 11.0–15.0)
WBC: 7 10*3/uL (ref 3.8–10.8)

## 2017-01-22 LAB — POCT GLYCOSYLATED HEMOGLOBIN (HGB A1C): HEMOGLOBIN A1C: 8.2

## 2017-01-22 LAB — GLUCOSE, POCT (MANUAL RESULT ENTRY): POC GLUCOSE: 78 mg/dL (ref 70–99)

## 2017-01-22 MED ORDER — GLIPIZIDE 10 MG PO TABS
20.0000 mg | ORAL_TABLET | Freq: Two times a day (BID) | ORAL | 0 refills | Status: DC
Start: 2017-01-22 — End: 2017-03-05

## 2017-01-22 MED ORDER — PIOGLITAZONE HCL 30 MG PO TABS: 30.0000 mg | ORAL_TABLET | Freq: Every day | ORAL | 0 refills | Status: DC

## 2017-01-22 MED ORDER — NAPROXEN 500 MG PO TABS: 500.0000 mg | ORAL_TABLET | Freq: Two times a day (BID) | ORAL | 0 refills | Status: DC

## 2017-01-22 MED ORDER — PIOGLITAZONE HCL 15 MG PO TABS: 30.0000 mg | ORAL_TABLET | Freq: Every day | ORAL | 0 refills | Status: DC

## 2017-01-22 MED ORDER — GLIPIZIDE 10 MG PO TABS: 20.0000 mg | ORAL_TABLET | Freq: Two times a day (BID) | ORAL | 3 refills | Status: DC

## 2017-01-22 MED ORDER — METHOCARBAMOL 500 MG PO TABS: 500.0000 mg | ORAL_TABLET | Freq: Three times a day (TID) | ORAL | 0 refills | Status: DC

## 2017-01-22 MED FILL — glipiZIDE 10 MG TABS: 10 | 30 days supply | Qty: 120 | Fill #0

## 2017-01-22 MED FILL — NAPROXEN 500 MG TABLET: 500 | 15 days supply | Qty: 30 | Fill #0

## 2017-01-22 MED FILL — METHOCARBAMOL 500 MG TABLET: 500 | 20 days supply | Qty: 60 | Fill #0

## 2017-01-22 NOTE — Progress Notes (Signed)
Patient is here for establish care  Patient complains stomach and lower back pain

## 2017-01-22 NOTE — Patient Instructions (Signed)
Check blood sugars fasting and 2 other times daily(3X daily) and record and bring to your next visit
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
## 2017-01-24 ENCOUNTER — Telehealth: Payer: Self-pay

## 2017-01-24 NOTE — Telephone Encounter (Signed)
Contacted pt to go over lab results pt is aware of results and doesn't have any questions or concerns 

## 2017-03-05 ENCOUNTER — Ambulatory Visit: Payer: BC Managed Care – PPO | Attending: Family Medicine | Admitting: Family Medicine

## 2017-03-05 ENCOUNTER — Encounter: Payer: Self-pay | Admitting: Family Medicine

## 2017-03-05 VITALS — BP 158/85 | HR 91 | Temp 98.5°F | Ht 76.0 in | Wt 288.0 lb

## 2017-03-05 DIAGNOSIS — Z7984 Long term (current) use of oral hypoglycemic drugs: Secondary | ICD-10-CM | POA: Insufficient documentation

## 2017-03-05 DIAGNOSIS — E1122 Type 2 diabetes mellitus with diabetic chronic kidney disease: Secondary | ICD-10-CM | POA: Diagnosis present

## 2017-03-05 DIAGNOSIS — Z79899 Other long term (current) drug therapy: Secondary | ICD-10-CM | POA: Diagnosis not present

## 2017-03-05 DIAGNOSIS — E7800 Pure hypercholesterolemia, unspecified: Secondary | ICD-10-CM | POA: Diagnosis not present

## 2017-03-05 DIAGNOSIS — N183 Chronic kidney disease, stage 3 (moderate): Secondary | ICD-10-CM | POA: Diagnosis not present

## 2017-03-05 DIAGNOSIS — K219 Gastro-esophageal reflux disease without esophagitis: Secondary | ICD-10-CM | POA: Diagnosis not present

## 2017-03-05 DIAGNOSIS — I129 Hypertensive chronic kidney disease with stage 1 through stage 4 chronic kidney disease, or unspecified chronic kidney disease: Secondary | ICD-10-CM | POA: Insufficient documentation

## 2017-03-05 DIAGNOSIS — E0800 Diabetes mellitus due to underlying condition with hyperosmolarity without nonketotic hyperglycemic-hyperosmolar coma (NKHHC): Secondary | ICD-10-CM

## 2017-03-05 DIAGNOSIS — I10 Essential (primary) hypertension: Secondary | ICD-10-CM

## 2017-03-05 DIAGNOSIS — N182 Chronic kidney disease, stage 2 (mild): Secondary | ICD-10-CM

## 2017-03-05 DIAGNOSIS — E1149 Type 2 diabetes mellitus with other diabetic neurological complication: Secondary | ICD-10-CM | POA: Diagnosis not present

## 2017-03-05 LAB — GLUCOSE, POCT (MANUAL RESULT ENTRY): POC Glucose: 158 mg/dl — AB (ref 70–99)

## 2017-03-05 MED ORDER — SITAGLIPTIN PHOSPHATE 100 MG PO TABS: 100.0000 mg | ORAL_TABLET | Freq: Every day | ORAL | 3 refills | Status: DC

## 2017-03-05 MED ORDER — ATORVASTATIN CALCIUM 20 MG PO TABS: 20.0000 mg | ORAL_TABLET | Freq: Every day | ORAL | 3 refills | Status: DC

## 2017-03-05 MED ORDER — OMEPRAZOLE 20 MG PO CPDR: 20.0000 mg | DELAYED_RELEASE_CAPSULE | Freq: Every day | ORAL | 3 refills | Status: DC

## 2017-03-05 MED ORDER — GABAPENTIN 300 MG PO CAPS: 300.0000 mg | ORAL_CAPSULE | Freq: Every evening | ORAL | 3 refills | Status: DC | PRN

## 2017-03-05 MED ORDER — SUCRALFATE 1 G PO TABS: 1.0000 g | ORAL_TABLET | Freq: Three times a day (TID) | ORAL | 3 refills | Status: DC

## 2017-03-05 MED ORDER — GLIPIZIDE 10 MG PO TABS: 20.0000 mg | ORAL_TABLET | Freq: Two times a day (BID) | ORAL | 3 refills | Status: DC

## 2017-03-05 MED ORDER — LISINOPRIL-HYDROCHLOROTHIAZIDE 10-12.5 MG PO TABS: 1.0000 | ORAL_TABLET | Freq: Every day | ORAL | 3 refills | Status: DC
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
## 2017-03-05 NOTE — Patient Instructions (Signed)
Food Choices for Gastroesophageal Reflux Disease, Adult When you have gastroesophageal reflux disease (GERD), the foods you eat and your eating habits are very important. Choosing the right foods can help ease your discomfort. What guidelines do I need to follow?  Choose fruits, vegetables, whole grains, and low-fat dairy products.  Choose low-fat meat, fish, and poultry.  Limit fats such as oils, salad dressings, butter, nuts, and avocado.  Keep a food diary. This helps you identify foods that cause symptoms.  Avoid foods that cause symptoms. These may be different for everyone.  Eat small meals often instead of 3 large meals a day.  Eat your meals slowly, in a place where you are relaxed.  Limit fried foods.  Cook foods using methods other than frying.  Avoid drinking alcohol.  Avoid drinking large amounts of liquids with your meals.  Avoid bending over or lying down until 2-3 hours after eating. What foods are not recommended? These are some foods and drinks that may make your symptoms worse: Vegetables  Tomatoes. Tomato juice. Tomato and spaghetti sauce. Chili peppers. Onion and garlic. Horseradish. Fruits  Oranges, grapefruit, and lemon (fruit and juice). Meats  High-fat meats, fish, and poultry. This includes hot dogs, ribs, ham, sausage, salami, and bacon. Dairy  Whole milk and chocolate milk. Sour cream. Cream. Butter. Ice cream. Cream cheese. Drinks  Coffee and tea. Bubbly (carbonated) drinks or energy drinks. Condiments  Hot sauce. Barbecue sauce. Sweets/Desserts  Chocolate and cocoa. Donuts. Peppermint and spearmint. Fats and Oils  High-fat foods. This includes French fries and potato chips. Other  Vinegar. Strong spices. This includes black pepper, white pepper, red pepper, cayenne, curry powder, cloves, ginger, and chili powder. The items listed above may not be a complete list of foods and drinks to avoid. Contact your dietitian for more information.    This information is not intended to replace advice given to you by your health care provider. Make sure you discuss any questions you have with your health care provider. Document Released: 06/16/2012 Document Revised: 05/23/2016 Document Reviewed: 10/20/2013 Elsevier Interactive Patient Education  2017 Elsevier Inc.  

## 2017-03-06 LAB — H. PYLORI BREATH TEST: H. pylori Breath Test: NOT DETECTED

## 2017-03-07 ENCOUNTER — Telehealth: Payer: Self-pay

## 2017-03-07 NOTE — Telephone Encounter (Signed)
Writer called and LVM on patient's cell phone letting him know that his lab results were normal.  Patient encouraged to call back with questions.

## 2017-03-07 NOTE — Telephone Encounter (Signed)
-----   Message from Arnoldo Morale, MD sent at 03/06/2017 12:59 PM EST ----- Please inform the patient that labs are normal. Thank you.

## 2017-05-15 ENCOUNTER — Other Ambulatory Visit: Payer: Self-pay | Admitting: Family Medicine

## 2017-05-15 DIAGNOSIS — E1149 Type 2 diabetes mellitus with other diabetic neurological complication: Secondary | ICD-10-CM

## 2017-06-15 ENCOUNTER — Other Ambulatory Visit: Payer: Self-pay | Admitting: Family Medicine

## 2017-06-15 DIAGNOSIS — E1149 Type 2 diabetes mellitus with other diabetic neurological complication: Secondary | ICD-10-CM

## 2017-06-30 ENCOUNTER — Other Ambulatory Visit: Payer: Self-pay | Admitting: Family Medicine

## 2017-06-30 DIAGNOSIS — I10 Essential (primary) hypertension: Secondary | ICD-10-CM

## 2017-06-30 DIAGNOSIS — E0800 Diabetes mellitus due to underlying condition with hyperosmolarity without nonketotic hyperglycemic-hyperosmolar coma (NKHHC): Secondary | ICD-10-CM

## 2017-06-30 DIAGNOSIS — K219 Gastro-esophageal reflux disease without esophagitis: Secondary | ICD-10-CM

## 2017-07-30 ENCOUNTER — Other Ambulatory Visit: Payer: Self-pay | Admitting: Family Medicine

## 2017-07-30 DIAGNOSIS — K219 Gastro-esophageal reflux disease without esophagitis: Secondary | ICD-10-CM

## 2017-07-30 DIAGNOSIS — I10 Essential (primary) hypertension: Secondary | ICD-10-CM

## 2017-07-30 DIAGNOSIS — E0800 Diabetes mellitus due to underlying condition with hyperosmolarity without nonketotic hyperglycemic-hyperosmolar coma (NKHHC): Secondary | ICD-10-CM

## 2017-08-23 ENCOUNTER — Other Ambulatory Visit: Payer: Self-pay | Admitting: Family Medicine

## 2017-08-23 DIAGNOSIS — E1149 Type 2 diabetes mellitus with other diabetic neurological complication: Secondary | ICD-10-CM

## 2017-12-01 ENCOUNTER — Other Ambulatory Visit: Payer: Self-pay

## 2017-12-01 ENCOUNTER — Ambulatory Visit (INDEPENDENT_AMBULATORY_CARE_PROVIDER_SITE_OTHER): Payer: BC Managed Care – PPO | Admitting: Physician Assistant

## 2017-12-01 ENCOUNTER — Encounter: Payer: Self-pay | Admitting: Gastroenterology

## 2017-12-01 ENCOUNTER — Encounter (INDEPENDENT_AMBULATORY_CARE_PROVIDER_SITE_OTHER): Payer: Self-pay | Admitting: Physician Assistant

## 2017-12-01 VITALS — BP 122/89 | HR 94 | Temp 98.1°F | Wt 271.0 lb

## 2017-12-01 DIAGNOSIS — Z114 Encounter for screening for human immunodeficiency virus [HIV]: Secondary | ICD-10-CM | POA: Diagnosis not present

## 2017-12-01 DIAGNOSIS — R1909 Other intra-abdominal and pelvic swelling, mass and lump: Secondary | ICD-10-CM

## 2017-12-01 DIAGNOSIS — Z1211 Encounter for screening for malignant neoplasm of colon: Secondary | ICD-10-CM

## 2017-12-01 DIAGNOSIS — N182 Chronic kidney disease, stage 2 (mild): Secondary | ICD-10-CM

## 2017-12-01 DIAGNOSIS — E1122 Type 2 diabetes mellitus with diabetic chronic kidney disease: Secondary | ICD-10-CM | POA: Diagnosis not present

## 2017-12-01 LAB — POCT GLYCOSYLATED HEMOGLOBIN (HGB A1C): HEMOGLOBIN A1C: 12.2

## 2017-12-01 MED ORDER — ACCU-CHEK SOFT TOUCH LANCETS MISC: 2 refills | Status: DC

## 2017-12-01 MED ORDER — INSULIN GLARGINE 100 UNIT/ML SOLOSTAR PEN: 35.0000 [IU] | PEN_INJECTOR | Freq: Every day | SUBCUTANEOUS | 2 refills | Status: DC

## 2017-12-01 MED ORDER — METFORMIN HCL ER (MOD) 1000 MG PO TB24: 1000.0000 mg | ORAL_TABLET | Freq: Every day | ORAL | 3 refills | Status: DC

## 2017-12-01 MED ORDER — PEN NEEDLES 30G X 8 MM MISC: 1.0000 "application " | Freq: Every day | 2 refills | Status: DC

## 2017-12-01 NOTE — Progress Notes (Signed)
Subjective:  Patient ID: Raymond Ryan, male    DOB: [birth year]  Age: 51 y.o. MRN: [id number]  CC: DM and mass in navel area  HPI Raymond Ryan is a 51 y.o. male with a medical history of Charcot foot due to DM, DM2, GERD, and hypertension presents as a new patient for management of DM2. Last A1c 8.2% on 01/22/17. A1c 12.2%  in clinic today. Took insulin before but does not want to take insulin now Endorses polyuria, polydipsia, LE numbness, metformin IR related diarrhea. Does not endorse visual blurring, fatigue, abdominal pain, night sweats, unintentional weight loss, CP, SOB, or HA.      Outpatient Medications Prior to Visit  Medication Sig Dispense Refill  . atorvastatin (LIPITOR) 20 MG tablet Take 1 tablet (20 mg total) by mouth daily. 30 tablet 3  . gabapentin (NEURONTIN) 300 MG capsule Take 1 capsule (300 mg total) by mouth at bedtime as needed. 30 capsule 3  . JANUVIA 100 MG tablet TAKE 1 TABLET BY MOUTH EVERY DAY 30 tablet 0  . lisinopril-hydrochlorothiazide (PRINZIDE,ZESTORETIC) 10-12.5 MG tablet TAKE 1 TABLET BY MOUTH EVERY DAY 30 tablet 0  . glipiZIDE (GLUCOTROL) 10 MG tablet TAKE 2 TABLETS (20 MG TOTAL) BY MOUTH 2 (TWO) TIMES DAILY BEFORE A MEAL. OFFICE VISIT FOR REFILLS (Patient not taking: Reported on 12/01/2017) 120 tablet 0  . methocarbamol (ROBAXIN) 500 MG tablet Take 1 tablet (500 mg total) by mouth 3 (three) times daily. X 5 days then prn muscle spasm (Patient not taking: Reported on 12/01/2017) 60 tablet 0  . naproxen (NAPROSYN) 500 MG tablet Take 1 tablet (500 mg total) by mouth 2 (two) times daily with a meal. X 5 days then prn pain (Patient not taking: Reported on 12/01/2017) 30 tablet 0  . omeprazole (PRILOSEC) 20 MG capsule TAKE 1 CAPSULE BY MOUTH EVERY DAY (Patient not taking: Reported on 12/01/2017) 30 capsule 0  . sucralfate (CARAFATE) 1 g tablet Take 1 tablet (1 g total) by mouth 4 (four) times daily -  with meals and at bedtime. (Patient not taking: Reported  on 12/01/2017) 120 tablet 3   No facility-administered medications prior to visit.      ROS Review of Systems  Constitutional: Negative for chills, fever and malaise/fatigue.  Eyes: Negative for blurred vision.  Respiratory: Negative for shortness of breath.   Cardiovascular: Negative for chest pain and palpitations.  Gastrointestinal: Negative for abdominal pain and nausea.  Genitourinary: Negative for dysuria and hematuria.  Musculoskeletal: Negative for joint pain and myalgias.  Skin: Negative for rash.  Neurological: Negative for tingling and headaches.       LE numbness.  Endo/Heme/Allergies: Positive for polydipsia.  Psychiatric/Behavioral: Negative for depression. The patient is not nervous/anxious.     Objective:  BP 122/89 (BP Location: Right Arm, Patient Position: Sitting, Cuff Size: Large)   Pulse 94   Temp 98.1 F (36.7 C) (Oral)   Wt 271 lb (122.9 kg)   SpO2 100%   BMI 32.99 kg/m   BP/Weight 12/01/2017 03/05/2017 9/45/0388  Systolic BP 828 003 491  Diastolic BP 89 85 85  Wt. (Lbs) 271 288 291.6  BMI 32.99 35.06 35.49      Physical Exam  Constitutional: He is oriented to person, place, and time.  Well developed, well nourished, NAD, polite  HENT:  Head: Normocephalic and atraumatic.  Eyes: No scleral icterus.  Neck: Normal range of motion. Neck supple. No thyromegaly present.  Cardiovascular: Normal rate, regular rhythm and normal heart  sounds.  Pulmonary/Chest: Effort normal and breath sounds normal.  Abdominal: Soft. Bowel sounds are normal. There is no tenderness.  Musculoskeletal: He exhibits no edema.  Neurological: He is alert and oriented to person, place, and time.  Skin: Skin is warm and dry. No rash noted. No erythema. No pallor.  Psychiatric: He has a normal mood and affect. His behavior is normal. Thought content normal.  Vitals reviewed.    Assessment & Plan:   1. Type 2 diabetes mellitus with stage 2 chronic kidney disease, without  long-term current use of insulin (HCC) - HgB A1c 12.2% in clinic today. - Begin Insulin Glargine (LANTUS SOLOSTAR) 100 UNIT/ML Solostar Pen; Inject 35 Units into the skin daily at 10 pm.  Dispense: 5 pen; Refill: 2 - Begin Insulin Pen Needle (PEN NEEDLES) 30G X 8 MM MISC; 1 application by Does not apply route daily.  Dispense: 30 each; Refill: 2 - Lipid panel - Comprehensive metabolic panel - CBC with Differential - Begin metFORMIN (GLUMETZA) 1000 MG (MOD) 24 hr tablet; Take 1 tablet (1,000 mg total) by mouth daily with breakfast.  Dispense: 90 tablet; Refill: 3 - Stop Metformin 1000 mg IR BID. - Refill Lancets (ACCU-CHEK SOFT TOUCH) lancets; Use as instructed  Dispense: 100 each; Refill: 2 - TSH  2. Special screening for malignant neoplasms, colon - Ambulatory referral to Gastroenterology  3. Umbilical mass - US Abdomen Limited; Future  4. Screening for HIV (human immunodeficiency virus) - HIV antibody   Meds ordered this encounter  Medications  . Insulin Glargine (LANTUS SOLOSTAR) 100 UNIT/ML Solostar Pen    Sig: Inject 35 Units into the skin daily at 10 pm.    Dispense:  5 pen    Refill:  2    Order Specific Question:   Supervising Provider    Answer:   Tresa Garter [id number]  . Insulin Pen Needle (PEN NEEDLES) 30G X 8 MM MISC    Sig: 1 application by Does not apply route daily.    Dispense:  30 each    Refill:  2    Order Specific Question:   Supervising Provider    Answer:   Tresa Garter [id number]  . metFORMIN (GLUMETZA) 1000 MG (MOD) 24 hr tablet    Sig: Take 1 tablet (1,000 mg total) by mouth daily with breakfast.    Dispense:  90 tablet    Refill:  3    Order Specific Question:   Supervising Provider    Answer:   Tresa Garter [id number]  . Lancets (ACCU-CHEK SOFT TOUCH) lancets    Sig: Use as instructed    Dispense:  100 each    Refill:  2    Order Specific Question:   Supervising Provider    Answer:   Tresa Garter [[id number]]     Follow-up: 3 months for DM. Two weeks for vaccinations.  Clent Demark PA

## 2017-12-02 ENCOUNTER — Other Ambulatory Visit (INDEPENDENT_AMBULATORY_CARE_PROVIDER_SITE_OTHER): Payer: Self-pay | Admitting: Physician Assistant

## 2017-12-02 ENCOUNTER — Telehealth (INDEPENDENT_AMBULATORY_CARE_PROVIDER_SITE_OTHER): Payer: Self-pay

## 2017-12-02 ENCOUNTER — Telehealth (INDEPENDENT_AMBULATORY_CARE_PROVIDER_SITE_OTHER): Payer: Self-pay | Admitting: Physician Assistant

## 2017-12-02 DIAGNOSIS — E0865 Diabetes mellitus due to underlying condition with hyperglycemia: Secondary | ICD-10-CM

## 2017-12-02 LAB — COMPREHENSIVE METABOLIC PANEL
ALBUMIN: 4.7 g/dL (ref 3.5–5.5)
ALK PHOS: 106 IU/L (ref 39–117)
ALT: 15 IU/L (ref 0–44)
AST: 12 IU/L (ref 0–40)
Albumin/Globulin Ratio: 1.4 (ref 1.2–2.2)
BUN / CREAT RATIO: 11 (ref 9–20)
BUN: 17 mg/dL (ref 6–24)
Bilirubin Total: 0.4 mg/dL (ref 0.0–1.2)
CALCIUM: 10.3 mg/dL — AB (ref 8.7–10.2)
CO2: 23 mmol/L (ref 20–29)
CREATININE: 1.54 mg/dL — AB (ref 0.76–1.27)
Chloride: 96 mmol/L (ref 96–106)
GFR calc Af Amer: 60 mL/min/{1.73_m2} (ref >59)
GFR, EST NON AFRICAN AMERICAN: 52 mL/min/{1.73_m2} — AB (ref >59)
GLOBULIN, TOTAL: 3.3 g/dL (ref 1.5–4.5)
GLUCOSE: 303 mg/dL — AB (ref 65–99)
Potassium: 5.6 mmol/L — ABNORMAL HIGH (ref 3.5–5.2)
Sodium: 139 mmol/L (ref 134–144)
Total Protein: 8 g/dL (ref 6.0–8.5)

## 2017-12-02 LAB — CBC WITH DIFFERENTIAL/PLATELET
BASOS: 1 %
Basophils Absolute: 0.1 10*3/uL (ref 0.0–0.2)
EOS (ABSOLUTE): 0.1 10*3/uL (ref 0.0–0.4)
EOS: 2 %
HEMATOCRIT: 47.7 % (ref 37.5–51.0)
HEMOGLOBIN: 16.2 g/dL (ref 13.0–17.7)
Immature Grans (Abs): 0.1 10*3/uL (ref 0.0–0.1)
Immature Granulocytes: 1 %
LYMPHS ABS: 1.7 10*3/uL (ref 0.7–3.1)
Lymphs: 25 %
MCH: 28.6 pg (ref 26.6–33.0)
MCHC: 34 g/dL (ref 31.5–35.7)
MCV: 84 fL (ref 79–97)
MONOCYTES: 5 %
Monocytes Absolute: 0.3 10*3/uL (ref 0.1–0.9)
NEUTROS PCT: 66 %
Neutrophils Absolute: 4.6 10*3/uL (ref 1.4–7.0)
Platelets: 410 10*3/uL — ABNORMAL HIGH (ref 150–379)
RBC: 5.67 x10E6/uL (ref 4.14–5.80)
RDW: 14.5 % (ref 12.3–15.4)
WBC: 6.9 10*3/uL (ref 3.4–10.8)

## 2017-12-02 LAB — LIPID PANEL
CHOL/HDL RATIO: 6.5 ratio — AB (ref 0.0–5.0)
Cholesterol, Total: 214 mg/dL — ABNORMAL HIGH (ref 100–199)
HDL: 33 mg/dL — ABNORMAL LOW (ref >39)
LDL CALC: 131 mg/dL — AB (ref 0–99)
Triglycerides: 250 mg/dL — ABNORMAL HIGH (ref 0–149)
VLDL Cholesterol Cal: 50 mg/dL — ABNORMAL HIGH (ref 5–40)

## 2017-12-02 LAB — HIV ANTIBODY (ROUTINE TESTING W REFLEX): HIV Screen 4th Generation wRfx: NONREACTIVE

## 2017-12-02 LAB — TSH: TSH: 2.08 u[IU]/mL (ref 0.450–4.500)

## 2017-12-02 MED ORDER — METFORMIN HCL ER (OSM) 500 MG PO TB24: 1000.0000 mg | ORAL_TABLET | Freq: Every day | ORAL | 2 refills | Status: DC

## 2017-12-02 MED ORDER — ACCU-CHEK SOFT TOUCH LANCETS MISC: 2 refills | Status: DC

## 2017-12-02 MED ORDER — BASAGLAR KWIKPEN 100 UNIT/ML ~~LOC~~ SOPN: 35.0000 [IU] | PEN_INJECTOR | Freq: Every day | SUBCUTANEOUS | 2 refills | Status: DC

## 2017-12-02 NOTE — Telephone Encounter (Signed)
FWD to PCP. Tempestt S Roberts, CMA  

## 2017-12-02 NOTE — Telephone Encounter (Signed)
-----   Message from Clent Demark, [name] sent at 12/02/2017  1:52 PM EST ----- Elevated cholesterol and triglycerides. Eat less sweets and fats. I will send atorvastatin to his pharmacy. I have also sent lancets, insulin, and metformin requests.

## 2017-12-02 NOTE — Telephone Encounter (Signed)
Patient was here yesterday and he said that PA Altamease Oiler forgot to RX  Lisinopril .Please, call him at  [phone] Thank You

## 2017-12-02 NOTE — Telephone Encounter (Signed)
Patient aware of results and medication being sent. Nat Christen, CMA

## 2017-12-02 NOTE — Telephone Encounter (Signed)
Pt was is calling regarding his Lisinopril medicine  That PA

## 2017-12-03 ENCOUNTER — Other Ambulatory Visit (INDEPENDENT_AMBULATORY_CARE_PROVIDER_SITE_OTHER): Payer: Self-pay | Admitting: Physician Assistant

## 2017-12-03 DIAGNOSIS — Z760 Encounter for issue of repeat prescription: Secondary | ICD-10-CM

## 2017-12-03 DIAGNOSIS — I10 Essential (primary) hypertension: Secondary | ICD-10-CM

## 2017-12-03 MED ORDER — LISINOPRIL-HYDROCHLOROTHIAZIDE 10-12.5 MG PO TABS: 1.0000 | ORAL_TABLET | Freq: Every day | ORAL | 0 refills | Status: DC

## 2017-12-03 NOTE — Telephone Encounter (Signed)
Refill of lisinopril-hctz has been sent to [facility].

## 2017-12-05 ENCOUNTER — Ambulatory Visit (HOSPITAL_COMMUNITY): Payer: BC Managed Care – PPO

## 2017-12-08 ENCOUNTER — Ambulatory Visit (HOSPITAL_COMMUNITY): Payer: BC Managed Care – PPO

## 2017-12-19 ENCOUNTER — Telehealth (INDEPENDENT_AMBULATORY_CARE_PROVIDER_SITE_OTHER): Payer: Self-pay | Admitting: Physician Assistant

## 2017-12-19 ENCOUNTER — Other Ambulatory Visit (INDEPENDENT_AMBULATORY_CARE_PROVIDER_SITE_OTHER): Payer: Self-pay | Admitting: Physician Assistant

## 2017-12-19 DIAGNOSIS — N182 Chronic kidney disease, stage 2 (mild): Principal | ICD-10-CM

## 2017-12-19 DIAGNOSIS — E1122 Type 2 diabetes mellitus with diabetic chronic kidney disease: Secondary | ICD-10-CM

## 2017-12-19 MED ORDER — METFORMIN HCL 500 MG PO TABS: 500.0000 mg | ORAL_TABLET | Freq: Two times a day (BID) | ORAL | 3 refills | Status: DC

## 2017-12-19 NOTE — Telephone Encounter (Signed)
Patient call requesting his medications to be sent to [facility]  Thank you

## 2017-12-19 NOTE — Telephone Encounter (Signed)
FWD to PCP. Tempestt S Roberts, CMA  

## 2017-12-19 NOTE — Progress Notes (Unsigned)
I prescribed Metformin IR 500 mg BID because patient's insurance has denied both Metformin 1000 mg ER and 500 mg ER. I have called and left message for patient to pick up Metformin at [place].

## 2017-12-24 ENCOUNTER — Telehealth (INDEPENDENT_AMBULATORY_CARE_PROVIDER_SITE_OTHER): Payer: Self-pay | Admitting: Physician Assistant

## 2017-12-24 ENCOUNTER — Other Ambulatory Visit (INDEPENDENT_AMBULATORY_CARE_PROVIDER_SITE_OTHER): Payer: Self-pay | Admitting: Physician Assistant

## 2017-12-24 DIAGNOSIS — N182 Chronic kidney disease, stage 2 (mild): Principal | ICD-10-CM

## 2017-12-24 DIAGNOSIS — E1122 Type 2 diabetes mellitus with diabetic chronic kidney disease: Secondary | ICD-10-CM

## 2017-12-24 MED ORDER — METFORMIN HCL 500 MG PO TABS: 500.0000 mg | ORAL_TABLET | Freq: Two times a day (BID) | ORAL | 3 refills | Status: DC

## 2017-12-24 NOTE — Telephone Encounter (Signed)
I have now changed from [place] to [facility] on [address].

## 2017-12-24 NOTE — Telephone Encounter (Signed)
Patient came to the office requesting his Januvia 100mg  & Lisinopril refill [facility] to [facility] . Please, call him and let him know when is ready to  [phone] Thank You

## 2017-12-25 NOTE — Telephone Encounter (Signed)
FWD to PCP. Tempestt S Roberts, CMA  

## 2017-12-26 ENCOUNTER — Other Ambulatory Visit (INDEPENDENT_AMBULATORY_CARE_PROVIDER_SITE_OTHER): Payer: Self-pay | Admitting: Physician Assistant

## 2017-12-26 DIAGNOSIS — E0800 Diabetes mellitus due to underlying condition with hyperosmolarity without nonketotic hyperglycemic-hyperosmolar coma (NKHHC): Secondary | ICD-10-CM

## 2017-12-26 DIAGNOSIS — Z760 Encounter for issue of repeat prescription: Secondary | ICD-10-CM

## 2017-12-26 MED ORDER — SITAGLIPTIN PHOSPHATE 100 MG PO TABS
100.0000 mg | ORAL_TABLET | Freq: Every day | ORAL | 11 refills | Status: DC
Start: 2017-12-26 — End: 2018-04-06

## 2017-12-26 MED ORDER — LISINOPRIL-HYDROCHLOROTHIAZIDE 10-12.5 MG PO TABS: 1.0000 | ORAL_TABLET | Freq: Every day | ORAL | 11 refills | Status: DC

## 2017-12-26 NOTE — Telephone Encounter (Signed)
[facility] to [facility]

## 2017-12-31 ENCOUNTER — Ambulatory Visit (INDEPENDENT_AMBULATORY_CARE_PROVIDER_SITE_OTHER): Payer: BC Managed Care – PPO | Admitting: Physician Assistant

## 2018-01-01 ENCOUNTER — Encounter: Payer: Self-pay | Admitting: Physician Assistant

## 2018-01-16 ENCOUNTER — Other Ambulatory Visit (INDEPENDENT_AMBULATORY_CARE_PROVIDER_SITE_OTHER): Payer: Self-pay | Admitting: Physician Assistant

## 2018-01-16 MED ORDER — METFORMIN HCL 500 MG PO TABS: 500.0000 mg | ORAL_TABLET | Freq: Two times a day (BID) | ORAL | 5 refills | Status: DC

## 2018-01-28 ENCOUNTER — Encounter: Payer: BC Managed Care – PPO | Admitting: Gastroenterology

## 2018-02-26 LAB — HM DIABETES EYE EXAM

## 2018-03-21 ENCOUNTER — Other Ambulatory Visit (INDEPENDENT_AMBULATORY_CARE_PROVIDER_SITE_OTHER): Payer: Self-pay | Admitting: Physician Assistant

## 2018-03-23 NOTE — Telephone Encounter (Signed)
FWD to PCP. Tempestt S Roberts, CMA  

## 2018-04-06 ENCOUNTER — Encounter (INDEPENDENT_AMBULATORY_CARE_PROVIDER_SITE_OTHER): Payer: Self-pay | Admitting: Physician Assistant

## 2018-04-06 ENCOUNTER — Other Ambulatory Visit: Payer: Self-pay

## 2018-04-06 ENCOUNTER — Ambulatory Visit (INDEPENDENT_AMBULATORY_CARE_PROVIDER_SITE_OTHER): Payer: BC Managed Care – PPO | Admitting: Physician Assistant

## 2018-04-06 VITALS — BP 143/96 | HR 94 | Temp 98.6°F | Ht 76.0 in | Wt 282.8 lb

## 2018-04-06 DIAGNOSIS — Z760 Encounter for issue of repeat prescription: Secondary | ICD-10-CM

## 2018-04-06 DIAGNOSIS — N182 Chronic kidney disease, stage 2 (mild): Secondary | ICD-10-CM

## 2018-04-06 DIAGNOSIS — Z029 Encounter for administrative examinations, unspecified: Secondary | ICD-10-CM

## 2018-04-06 DIAGNOSIS — I10 Essential (primary) hypertension: Secondary | ICD-10-CM

## 2018-04-06 DIAGNOSIS — Z794 Long term (current) use of insulin: Secondary | ICD-10-CM

## 2018-04-06 DIAGNOSIS — E1122 Type 2 diabetes mellitus with diabetic chronic kidney disease: Secondary | ICD-10-CM | POA: Diagnosis not present

## 2018-04-06 LAB — POCT GLYCOSYLATED HEMOGLOBIN (HGB A1C): HEMOGLOBIN A1C: 11.8

## 2018-04-06 MED ORDER — ATORVASTATIN CALCIUM 20 MG PO TABS: 20.0000 mg | ORAL_TABLET | Freq: Every day | ORAL | 3 refills | Status: DC

## 2018-04-06 MED ORDER — PEN NEEDLES 30G X 8 MM MISC: 1.0000 "application " | Freq: Every day | 2 refills | Status: DC

## 2018-04-06 MED ORDER — LISINOPRIL-HYDROCHLOROTHIAZIDE 10-12.5 MG PO TABS: 2.0000 | ORAL_TABLET | Freq: Every day | ORAL | 11 refills | Status: DC

## 2018-04-06 MED ORDER — GABAPENTIN 300 MG PO CAPS: 300.0000 mg | ORAL_CAPSULE | Freq: Every evening | ORAL | 3 refills | Status: DC | PRN

## 2018-04-06 MED ORDER — ACCU-CHEK SOFT TOUCH LANCETS MISC: 2 refills | Status: DC

## 2018-04-06 MED ORDER — METFORMIN HCL 1000 MG PO TABS: 1000.0000 mg | ORAL_TABLET | Freq: Two times a day (BID) | ORAL | 3 refills | Status: DC

## 2018-04-06 MED ORDER — BASAGLAR KWIKPEN 100 UNIT/ML ~~LOC~~ SOPN: 60.0000 [IU] | PEN_INJECTOR | Freq: Every day | SUBCUTANEOUS | 6 refills | Status: DC

## 2018-04-06 MED ORDER — SITAGLIPTIN PHOSPHATE 100 MG PO TABS: 100.0000 mg | ORAL_TABLET | Freq: Every day | ORAL | 11 refills | Status: DC

## 2018-04-06 NOTE — Progress Notes (Signed)
Subjective:  Patient ID: Raymond Ryan, male    DOB: Jun 21, 1966  Age: 52 y.o. MRN: [id number]  CC: DMV paperwork  HPI Raymond Ryan is a 52 y.o. 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 Reports no incidents and says his blood sugar is in control. Blood sugars in the 170s on average. Takes insulin and medication as directed. Denies polydipsia, polyuria, polyphagia, fatigue, visual blurring, tingling, or numbness. A1c 11.8% today, down from 12.2% three months ago. BP is also noted to be elevated today. Takes antihypertensive as directed. Does not endorse CP, palpitations, SOB, HA, abdominal pain, presyncope, syncope, LE edema, or GI/GU sxs.     Outpatient Medications Prior to Visit  Medication Sig Dispense Refill  . atorvastatin (LIPITOR) 20 MG tablet Take 1 tablet (20 mg total) by mouth daily. 30 tablet 3  . gabapentin (NEURONTIN) 300 MG capsule Take 1 capsule (300 mg total) by mouth at bedtime as needed. 30 capsule 3  . glipiZIDE (GLUCOTROL) 10 MG tablet TAKE 2 TABLETS (20 MG TOTAL) BY MOUTH 2 (TWO) TIMES DAILY BEFORE A MEAL. OFFICE VISIT FOR REFILLS 120 tablet 0  . Insulin Glargine (BASAGLAR KWIKPEN) 100 UNIT/ML SOPN INJECT 0.35 MLS (35 UNITS TOTAL) INTO THE SKIN AT BEDTIME. 5 pen 5  . Insulin Pen Needle (PEN NEEDLES) 30G X 8 MM MISC 1 application by Does not apply route daily. 30 each 2  . Lancets (ACCU-CHEK SOFT TOUCH) lancets Use one new lancet three times per day. Do not reuse lancets. 100 each 2  . lisinopril-hydrochlorothiazide (PRINZIDE,ZESTORETIC) 10-12.5 MG tablet Take 1 tablet by mouth daily. 30 tablet 11  . metFORMIN (GLUCOPHAGE) 500 MG tablet Take 1 tablet (500 mg total) by mouth 2 (two) times daily with a meal. 60 tablet 5  . sitaGLIPtin (JANUVIA) 100 MG tablet Take 1 tablet (100 mg total) by mouth daily. 30  tablet 11   No facility-administered medications prior to visit.      ROS Review of Systems  Constitutional: Negative for chills, fever and malaise/fatigue.  Eyes: Negative for blurred vision.  Respiratory: Negative for shortness of breath.   Cardiovascular: Negative for chest pain and palpitations.  Gastrointestinal: Negative for abdominal pain and nausea.  Genitourinary: Negative for dysuria and hematuria.  Musculoskeletal: Negative for joint pain and myalgias.  Skin: Negative for rash.  Neurological: Negative for tingling and headaches.  Psychiatric/Behavioral: Negative for depression. The patient is not nervous/anxious.     Objective:  BP (!) 143/96 (BP Location: Left Arm, Patient Position: Sitting, Cuff Size: Large)   Pulse 94   Temp 98.6 F (37 C) (Oral)   Ht 6\' 4"  (1.93 m)   Wt 282 lb 12.8 oz (128.3 kg)   SpO2 98%   BMI 34.42 kg/m   BP/Weight 04/06/2018 81/01/7516 0/0/1749  Systolic BP 449 675 916  Diastolic BP 96 89 85  Wt. (Lbs) 282.8 271 288  BMI 34.42 32.99 35.06      Physical Exam  Constitutional: He is oriented to person, place, and time.  Well developed, well nourished, NAD, polite  HENT:  Head: Normocephalic and atraumatic.  Eyes: Pupils are equal, round, and reactive to light. Conjunctivae are normal. No scleral icterus.  Neck: Normal range of motion. Neck supple. No thyromegaly present.  Cardiovascular: Normal rate, regular rhythm and normal heart sounds.  Pulmonary/Chest:  Effort normal and breath sounds normal.  Abdominal: Soft. Bowel sounds are normal. There is no tenderness.  Musculoskeletal: He exhibits no edema.  Neurological: He is alert and oriented to person, place, and time. No cranial nerve deficit. Coordination normal.  Skin: Skin is warm and dry. No rash noted. No erythema. No pallor.  Psychiatric: He has a normal mood and affect. His behavior is normal. Thought content normal.  Vitals reviewed.    Assessment & Plan:      1.  Encounters for administrative purpose - Medial Review Program of the Division of [employer].   2. Type 2 diabetes mellitus with stage 2 chronic kidney disease, with long-term current use of insulin (HCC) - HgB A1c 11.8% - metFORMIN (GLUCOPHAGE) 1000 MG tablet; Take 1 tablet (1,000 mg total) by mouth 2 (two) times daily with a meal.  Dispense: 180 tablet; Refill: 3 - sitaGLIPtin (JANUVIA) 100 MG tablet; Take 1 tablet (100 mg total) by mouth daily.  Dispense: 30 tablet; Refill: 11 - Lancets (ACCU-CHEK SOFT TOUCH) lancets; Use one new lancet three times per day. Do not reuse lancets.  Dispense: 100 each; Refill: 2 - Insulin Pen Needle (PEN NEEDLES) 30G X 8 MM MISC; 1 application by Does not apply route daily.  Dispense: 30 each; Refill: 2 - Insulin Glargine (BASAGLAR KWIKPEN) 100 UNIT/ML SOPN; Inject 0.6 mLs (60 Units total) into the skin at bedtime.  Dispense: 5 pen; Refill: 6  3. Essential hypertension - Increase lisinopril-hydrochlorothiazide (PRINZIDE,ZESTORETIC) 10-12.5 MG tablet; Take 2 tablets by mouth daily.  Dispense: 60 tablet; Refill: 11  4. Medication refill - gabapentin (NEURONTIN) 300 MG capsule; Take 1 capsule (300 mg total) by mouth at bedtime as needed.  Dispense: 30 capsule; Refill: 3 - atorvastatin (LIPITOR) 20 MG tablet; Take 1 tablet (20 mg total) by mouth daily.  Dispense: 30 tablet; Refill: 3   Meds ordered this encounter  Medications  . metFORMIN (GLUCOPHAGE) 1000 MG tablet    Sig: Take 1 tablet (1,000 mg total) by mouth 2 (two) times daily with a meal.    Dispense:  180 tablet    Refill:  3    Order Specific Question:   Supervising Provider    Answer:   Tresa Garter [id number]  . lisinopril-hydrochlorothiazide (PRINZIDE,ZESTORETIC) 10-12.5 MG tablet    Sig: Take 2 tablets by mouth daily.    Dispense:  60 tablet    Refill:  11    Must have office visit for refills    Order Specific Question:   Supervising Provider    Answer:   Tresa Garter  [id number]  . sitaGLIPtin (JANUVIA) 100 MG tablet    Sig: Take 1 tablet (100 mg total) by mouth daily.    Dispense:  30 tablet    Refill:  11    Must have office visit for refills    Order Specific Question:   Supervising Provider    Answer:   Tresa Garter [id number]  . Lancets (ACCU-CHEK SOFT TOUCH) lancets    Sig: Use one new lancet three times per day. Do not reuse lancets.    Dispense:  100 each    Refill:  2    Order Specific Question:   Supervising Provider    Answer:   Tresa Garter [id number]  . Insulin Pen Needle (PEN NEEDLES) 30G X 8 MM MISC    Sig: 1 application by Does not apply route daily.    Dispense:  30 each    Refill:  2    Order Specific Question:   Supervising Provider    Answer:   Tresa Garter [id number]  . Insulin Glargine (BASAGLAR KWIKPEN) 100 UNIT/ML SOPN    Sig: Inject 0.6 mLs (60 Units total) into the skin at bedtime.    Dispense:  5 pen    Refill:  6    Order Specific Question:   Supervising Provider    Answer:   Tresa Garter [id number]  . gabapentin (NEURONTIN) 300 MG capsule    Sig: Take 1 capsule (300 mg total) by mouth at bedtime as needed.    Dispense:  30 capsule    Refill:  3    Order Specific Question:   Supervising Provider    Answer:   Tresa Garter [id number]  . atorvastatin (LIPITOR) 20 MG tablet    Sig: Take 1 tablet (20 mg total) by mouth daily.    Dispense:  30 tablet    Refill:  3    Order Specific Question:   Supervising Provider    Answer:   Tresa Garter [[id number]]    Follow-up: 4 weeks HTN and DM  Clent Demark PA

## 2018-04-06 NOTE — Patient Instructions (Signed)
Hypoglycemia Hypoglycemia is when the sugar (glucose) level in the blood is too low. Symptoms of low blood sugar may include:  Feeling: ? Hungry. ? Worried or nervous (anxious). ? Sweaty and clammy. ? Confused. ? Dizzy. ? Sleepy. ? Sick to your stomach (nauseous).  Having: ? A fast heartbeat. ? A headache. ? A change in your vision. ? Jerky movements that you cannot control (seizure). ? Nightmares. ? Tingling or no feeling (numbness) around the mouth, lips, or tongue.  Having trouble with: ? Talking. ? Paying attention (concentrating). ? Moving (coordination). ? Sleeping.  Shaking.  Passing out (fainting).  Getting upset easily (irritability).  Low blood sugar can happen to people who have diabetes and people who do not have diabetes. Low blood sugar can happen quickly, and it can be an emergency. Treating Low Blood Sugar Low blood sugar is often treated by eating or drinking something sugary right away. If you can think clearly and swallow safely, follow the 15:15 rule:  Take 15 grams of a fast-acting carb (carbohydrate). Some fast-acting carbs are: ? 1 tube of glucose gel. ? 3 sugar tablets (glucose pills). ? 6-8 pieces of hard candy. ? 4 oz (120 mL) of fruit juice. ? 4 oz (120 mL) of regular (not diet) soda.  Check your blood sugar 15 minutes after you take the carb.  If your blood sugar is still at or below 70 mg/dL (3.9 mmol/L), take 15 grams of a carb again.  If your blood sugar does not go above 70 mg/dL (3.9 mmol/L) after 3 tries, get help right away.  After your blood sugar goes back to normal, eat a meal or a snack within 1 hour.  Treating Very Low Blood Sugar If your blood sugar is at or below 54 mg/dL (3 mmol/L), you have very low blood sugar (severe hypoglycemia). This is an emergency. Do not wait to see if the symptoms will go away. Get medical help right away. Call your local emergency services (911 in the [place] Do not drive yourself to the  hospital. If you have very low blood sugar and you cannot eat or drink, you may need a glucagon shot (injection). A family member or friend should learn how to check your blood sugar and how to give you a glucagon shot. Ask your doctor if you need to have a glucagon shot kit at home. Follow these instructions at home: General instructions  Avoid any diets that cause you to not eat enough food. Talk with your doctor before you start any new diet.  Take over-the-counter and prescription medicines only as told by your doctor.  Limit alcohol to no more than 1 drink per day for nonpregnant women and 2 drinks per day for men. One drink equals 12 oz of beer, 5 oz of wine, or 1 oz of hard liquor.  Keep all follow-up visits as told by your doctor. This is important. If You Have Diabetes:   Make sure you know the symptoms of low blood sugar.  Always keep a source of sugar with you, such as: ? Sugar. ? Sugar tablets. ? Glucose gel. ? Fruit juice. ? Regular soda (not diet soda). ? Milk. ? Hard candy. ? Honey.  Take your medicines as told.  Follow your exercise and meal plan. ? Eat on time. Do not skip meals. ? Follow your sick day plan when you cannot eat or drink normally. Make this plan ahead of time with your doctor.  Check your blood sugar as often   as told by your doctor. Always check before and after exercise.  Share your diabetes care plan with: ? Your work or school. ? People you live with.  Check your pee (urine) for ketones: ? When you are sick. ? As told by your doctor.  Carry a card or wear jewelry that says you have diabetes. If You Have Low Blood Sugar From Other Causes:   Check your blood sugar as often as told by your doctor.  Follow instructions from your doctor about what you cannot eat or drink. Contact a doctor if:  You have trouble keeping your blood sugar in your target range.  You have low blood sugar often. Get help right away if:  You still have  symptoms after you eat or drink something sugary.  Your blood sugar is at or below 54 mg/dL (3 mmol/L).  You have jerky movements that you cannot control.  You pass out. These symptoms may be an emergency. Do not wait to see if the symptoms will go away. Get medical help right away. Call your local emergency services (911 in the [place] Do not drive yourself to the hospital. This information is not intended to replace advice given to you by your health care provider. Make sure you discuss any questions you have with your health care provider. Document Released: 03/12/2010 Document Revised: 05/23/2016 Document Reviewed: 01/19/2016 Elsevier Interactive Patient Education  2018 [employer].   Diabetes Mellitus and Nutrition When you have diabetes (diabetes mellitus), it is very important to have healthy eating habits because your blood sugar (glucose) levels are greatly affected by what you eat and drink. Eating healthy foods in the appropriate amounts, at about the same times every day, can help you:  Control your blood glucose.  Lower your risk of heart disease.  Improve your blood pressure.  Reach or maintain a healthy weight.  Every person with diabetes is different, and each person has different needs for a meal plan. Your health care provider may recommend that you work with a diet and nutrition specialist (dietitian) to make a meal plan that is best for you. Your meal plan may vary depending on factors such as:  The calories you need.  The medicines you take.  Your weight.  Your blood glucose, blood pressure, and cholesterol levels.  Your activity level.  Other health conditions you have, such as heart or kidney disease.  How do carbohydrates affect me? Carbohydrates affect your blood glucose level more than any other type of food. Eating carbohydrates naturally increases the amount of glucose in your blood. Carbohydrate counting is a method for keeping track of how many  carbohydrates you eat. Counting carbohydrates is important to keep your blood glucose at a healthy level, especially if you use insulin or take certain oral diabetes medicines. It is important to know how many carbohydrates you can safely have in each meal. This is different for every person. Your dietitian can help you calculate how many carbohydrates you should have at each meal and for snack. Foods that contain carbohydrates include:  Bread, cereal, rice, pasta, and crackers.  Potatoes and corn.  Peas, beans, and lentils.  Milk and yogurt.  Fruit and juice.  Desserts, such as cakes, cookies, ice cream, and candy.  How does alcohol affect me? Alcohol can cause a sudden decrease in blood glucose (hypoglycemia), especially if you use insulin or take certain oral diabetes medicines. Hypoglycemia can be a life-threatening condition. Symptoms of hypoglycemia (sleepiness, dizziness, and confusion) are similar  to symptoms of having too much alcohol. If your health care provider says that alcohol is safe for you, follow these guidelines:  Limit alcohol intake to no more than 1 drink per day for nonpregnant women and 2 drinks per day for men. One drink equals 12 oz of beer, 5 oz of wine, or 1 oz of hard liquor.  Do not drink on an empty stomach.  Keep yourself hydrated with water, diet soda, or unsweetened iced tea.  Keep in mind that regular soda, juice, and other mixers may contain a lot of sugar and must be counted as carbohydrates.  What are tips for following this plan? Reading food labels  Start by checking the serving size on the label. The amount of calories, carbohydrates, fats, and other nutrients listed on the label are based on one serving of the food. Many foods contain more than one serving per package.  Check the total grams (g) of carbohydrates in one serving. You can calculate the number of servings of carbohydrates in one serving by dividing the total carbohydrates by 15.  For example, if a food has 30 g of total carbohydrates, it would be equal to 2 servings of carbohydrates.  Check the number of grams (g) of saturated and trans fats in one serving. Choose foods that have low or no amount of these fats.  Check the number of milligrams (mg) of sodium in one serving. Most people should limit total sodium intake to less than 2,300 mg per day.  Always check the nutrition information of foods labeled as "low-fat" or "nonfat". These foods may be higher in added sugar or refined carbohydrates and should be avoided.  Talk to your dietitian to identify your daily goals for nutrients listed on the label. Shopping  Avoid buying canned, premade, or processed foods. These foods tend to be high in fat, sodium, and added sugar.  Shop around the outside edge of the grocery store. This includes fresh fruits and vegetables, bulk grains, fresh meats, and fresh dairy. Cooking  Use low-heat cooking methods, such as baking, instead of high-heat cooking methods like deep frying.  Cook using healthy oils, such as olive, canola, or sunflower oil.  Avoid cooking with butter, cream, or high-fat meats. Meal planning  Eat meals and snacks regularly, preferably at the same times every day. Avoid going long periods of time without eating.  Eat foods high in fiber, such as fresh fruits, vegetables, beans, and whole grains. Talk to your dietitian about how many servings of carbohydrates you can eat at each meal.  Eat 4-6 ounces of lean protein each day, such as lean meat, chicken, fish, eggs, or tofu. 1 ounce is equal to 1 ounce of meat, chicken, or fish, 1 egg, or 1/4 cup of tofu.  Eat some foods each day that contain healthy fats, such as avocado, nuts, seeds, and fish. Lifestyle   Check your blood glucose regularly.  Exercise at least 30 minutes 5 or more days each week, or as told by your health care provider.  Take medicines as told by your health care provider.  Do not  use any products that contain nicotine or tobacco, such as cigarettes and e-cigarettes. If you need help quitting, ask your health care provider.  Work with a Social worker or diabetes educator to identify strategies to manage stress and any emotional and social challenges. What are some questions to ask my health care provider?  Do I need to meet with a diabetes educator?  Do I  need to meet with a dietitian?  What number can I call if I have questions?  When are the best times to check my blood glucose? Where to find more information:  American Diabetes Association: diabetes.org/food-and-fitness/food  Academy of Nutrition and Dietetics: PokerClues.dk  [employer] of Diabetes and Digestive and Kidney Diseases (NIH): ContactWire.be Summary  A healthy meal plan will help you control your blood glucose and maintain a healthy lifestyle.  Working with a diet and nutrition specialist (dietitian) can help you make a meal plan that is best for you.  Keep in mind that carbohydrates and alcohol have immediate effects on your blood glucose levels. It is important to count carbohydrates and to use alcohol carefully. This information is not intended to replace advice given to you by your health care provider. Make sure you discuss any questions you have with your health care provider. Document Released: 09/12/2005 Document Revised: 01/20/2017 Document Reviewed: 01/20/2017 Elsevier Interactive Patient Education  [employer].

## 2018-04-07 ENCOUNTER — Ambulatory Visit (AMBULATORY_SURGERY_CENTER): Payer: Self-pay | Admitting: *Deleted

## 2018-04-07 ENCOUNTER — Other Ambulatory Visit: Payer: Self-pay

## 2018-04-07 ENCOUNTER — Encounter: Payer: Self-pay | Admitting: Gastroenterology

## 2018-04-07 VITALS — Ht 76.0 in | Wt 282.0 lb

## 2018-04-07 DIAGNOSIS — Z1211 Encounter for screening for malignant neoplasm of colon: Secondary | ICD-10-CM

## 2018-04-07 MED ORDER — NA SULFATE-K SULFATE-MG SULF 17.5-3.13-1.6 GM/177ML PO SOLN: 1.0000 | Freq: Once | ORAL | 0 refills | Status: AC

## 2018-04-07 NOTE — Progress Notes (Signed)
No egg or soy allergy known to patient  No issues with past sedation with any surgeries  or procedures, no intubation problems  No diet pills per patient No home 02 use per patient  No blood thinners per patient  Pt states  issues with constipation - states takes fiber and does stool daily- sometimes hard, sometimes soft - uses miralax and colace prn  No A fib or A flutter  EMMI video sent to pt's e mail - pt declined  $15 coupon for suprep to pt today in [place]

## 2018-04-13 ENCOUNTER — Other Ambulatory Visit: Payer: Self-pay | Admitting: Pharmacist

## 2018-04-13 MED ORDER — LISINOPRIL-HYDROCHLOROTHIAZIDE 20-25 MG PO TABS: 1.0000 | ORAL_TABLET | Freq: Every day | ORAL | 0 refills | Status: DC

## 2018-04-21 ENCOUNTER — Encounter: Payer: Self-pay | Admitting: Gastroenterology

## 2018-04-21 ENCOUNTER — Ambulatory Visit (AMBULATORY_SURGERY_CENTER): Payer: BC Managed Care – PPO | Admitting: Gastroenterology

## 2018-04-21 ENCOUNTER — Other Ambulatory Visit: Payer: Self-pay

## 2018-04-21 VITALS — BP 125/89 | HR 86 | Temp 98.0°F | Resp 17 | Ht 76.0 in | Wt 282.0 lb

## 2018-04-21 DIAGNOSIS — Z1211 Encounter for screening for malignant neoplasm of colon: Secondary | ICD-10-CM | POA: Diagnosis present

## 2018-04-21 DIAGNOSIS — Z1212 Encounter for screening for malignant neoplasm of rectum: Secondary | ICD-10-CM

## 2018-04-21 MED ORDER — SODIUM CHLORIDE 0.9 % IV SOLN: 500.0000 mL | Freq: Once | INTRAVENOUS | Status: DC

## 2018-04-21 NOTE — Progress Notes (Signed)
To PACU,VSS report to [email or web address]

## 2018-04-21 NOTE — Patient Instructions (Signed)
YOU HAD AN ENDOSCOPIC PROCEDURE TODAY AT [facility]:   Refer to the procedure report that was given to you for any specific questions about what was found during the examination.  If the procedure report does not answer your questions, please call your gastroenterologist to clarify.  If you requested that your care partner not be given the details of your procedure findings, then the procedure report has been included in a sealed envelope for you to review at your convenience later.  YOU SHOULD EXPECT: Some feelings of bloating in the abdomen. Passage of more gas than usual.  Walking can help get rid of the air that was put into your GI tract during the procedure and reduce the bloating. If you had a lower endoscopy (such as a colonoscopy or flexible sigmoidoscopy) you may notice spotting of blood in your stool or on the toilet paper. If you underwent a bowel prep for your procedure, you may not have a normal bowel movement for a few days.  Please Note:  You might notice some irritation and congestion in your nose or some drainage.  This is from the oxygen used during your procedure.  There is no need for concern and it should clear up in a day or so.  SYMPTOMS TO REPORT IMMEDIATELY:   Following lower endoscopy (colonoscopy or flexible sigmoidoscopy):  Excessive amounts of blood in the stool  Significant tenderness or worsening of abdominal pains  Swelling of the abdomen that is new, acute  Fever of 100F or higher  For urgent or emergent issues, a gastroenterologist can be reached at any hour by calling [phone].   DIET:  We do recommend a small meal at first, but then you may proceed to your regular diet.  Drink plenty of fluids but you should avoid alcoholic beverages for 24 hours.  ACTIVITY:  You should plan to take it easy for the rest of today and you should NOT DRIVE or use heavy machinery until tomorrow (because of the sedation medicines used during the test).     FOLLOW UP: Our staff will call the number listed on your records the next business day following your procedure to check on you and address any questions or concerns that you may have regarding the information given to you following your procedure. If we do not reach you, we will leave a message.  However, if you are feeling well and you are not experiencing any problems, there is no need to return our call.  We will assume that you have returned to your regular daily activities without incident.  If any biopsies were taken you will be contacted by phone or by letter within the next 1-3 weeks.  Please call us at [phone] if you have not heard about the biopsies in 3 weeks.   Repeat Colonoscopy screening in 10 years Hemorrhoids (handout given) Consider referral to sleep study for obstructive sleep  apnea   SIGNATURES/CONFIDENTIALITY: You and/or your care partner have signed paperwork which will be entered into your electronic medical record.  These signatures attest to the fact that that the information above on your After Visit Summary has been reviewed and is understood.  Full responsibility of the confidentiality of this discharge information lies with you and/or your care-partner.

## 2018-04-21 NOTE — Progress Notes (Signed)
Pt's states no medical or surgical changes since previsit or office visit. 

## 2018-04-21 NOTE — Op Note (Addendum)
[facility] Patient Name: Raymond Ryan Procedure Date: 04/21/2018 10:00 AM MRN: [id number] Endoscopist: Mauri Pole , MD Age: 52 Referring MD:  Date of Birth: 1966-05-09 Gender: Male Account #: [id number] Procedure:                Colonoscopy Indications:              Screening for colorectal malignant neoplasm Medicines:                Monitored Anesthesia Care Procedure:                Pre-Anesthesia Assessment:                           - Prior to the procedure, a History and Physical                            was performed, and patient medications and                            allergies were reviewed. The patient's tolerance of                            previous anesthesia was also reviewed. The risks                            and benefits of the procedure and the sedation                            options and risks were discussed with the patient.                            All questions were answered, and informed consent                            was obtained. Prior Anticoagulants: The patient has                            taken no previous anticoagulant or antiplatelet                            agents. ASA Grade Assessment: III - A patient with                            severe systemic disease. After reviewing the risks                            and benefits, the patient was deemed in                            satisfactory condition to undergo the procedure.                           After obtaining informed consent, the colonoscope  was passed under direct vision. Throughout the                            procedure, the patient's blood pressure, pulse, and                            oxygen saturations were monitored continuously. The                            Colonoscope was introduced through the anus and                            advanced to the the cecum, identified by                            appendiceal orifice and  ileocecal valve. The                            patient tolerated the procedure well. The quality                            of the bowel preparation was good. The ileocecal                            valve, appendiceal orifice, and rectum were                            photographed. The colonoscopy was technically                            difficult and complex due to the patient's oxygen                            desaturation. Successful completion of the                            procedure was aided by performing chin lift and                            administering oxygen. Scope In: 10:06:28 AM Scope Out: 10:20:47 AM Scope Withdrawal Time: 0 hours 11 minutes 9 seconds  Total Procedure Duration: 0 hours 14 minutes 19 seconds  Findings:                 The perianal and digital rectal examinations were                            normal.                           A few small and large-mouthed diverticula were                            found in the sigmoid colon and ascending colon.  Non-bleeding internal hemorrhoids were found during                            retroflexion. The hemorrhoids were small. Complications:            No immediate complications. Estimated Blood Loss:     Estimated blood loss: none. Impression:               - Diverticulosis in the sigmoid colon and in the                            ascending colon.                           - Non-bleeding internal hemorrhoids.                           - No specimens collected. Recommendation:           - Patient has a contact number available for                            emergencies. The signs and symptoms of potential                            delayed complications were discussed with the                            patient. Return to normal activities tomorrow.                            Written discharge instructions were provided to the                            patient.                            - Resume previous diet.                           - Continue present medications.                           - Repeat colonoscopy in 10 years for screening                            purposes.                           - Consider referral to sleep study for evaluation                            of obstructive sleep apnea Mauri Pole, MD 04/21/2018 10:31:01 AM This report has been signed electronically.

## 2018-04-22 ENCOUNTER — Telehealth: Payer: Self-pay

## 2018-04-22 NOTE — Telephone Encounter (Signed)
-   Follow up Call-  Call back number 04/21/2018  Post procedure Call Back phone  # [phone]  Permission to leave phone message Yes  Some recent data might be hidden     Patient questions:  Do you have a fever, pain , or abdominal swelling? No. Pain Score  0 *  Have you tolerated food without any problems? Yes.    Have you been able to return to your normal activities? Yes.    Do you have any questions about your discharge instructions: Diet   No. Medications  No. Follow up visit  No.  Do you have questions or concerns about your Care? No.  Actions: * If pain score is 4 or above: No action needed, pain <4.

## 2018-05-28 ENCOUNTER — Ambulatory Visit (INDEPENDENT_AMBULATORY_CARE_PROVIDER_SITE_OTHER): Payer: BC Managed Care – PPO | Admitting: Nurse Practitioner

## 2018-06-24 ENCOUNTER — Encounter (INDEPENDENT_AMBULATORY_CARE_PROVIDER_SITE_OTHER): Payer: Self-pay | Admitting: Physician Assistant

## 2018-06-24 ENCOUNTER — Ambulatory Visit (INDEPENDENT_AMBULATORY_CARE_PROVIDER_SITE_OTHER): Payer: BC Managed Care – PPO | Admitting: Physician Assistant

## 2018-06-24 VITALS — BP 141/96 | HR 104 | Temp 99.3°F | Resp 16 | Ht 76.0 in | Wt 280.8 lb

## 2018-06-24 DIAGNOSIS — N183 Chronic kidney disease, stage 3 unspecified: Secondary | ICD-10-CM

## 2018-06-24 DIAGNOSIS — Z794 Long term (current) use of insulin: Secondary | ICD-10-CM | POA: Diagnosis not present

## 2018-06-24 DIAGNOSIS — Z029 Encounter for administrative examinations, unspecified: Secondary | ICD-10-CM | POA: Diagnosis not present

## 2018-06-24 DIAGNOSIS — G479 Sleep disorder, unspecified: Secondary | ICD-10-CM | POA: Diagnosis not present

## 2018-06-24 DIAGNOSIS — E1122 Type 2 diabetes mellitus with diabetic chronic kidney disease: Secondary | ICD-10-CM | POA: Diagnosis not present

## 2018-06-24 LAB — POCT GLYCOSYLATED HEMOGLOBIN (HGB A1C): HBA1C, POC (CONTROLLED DIABETIC RANGE): 12 % — AB (ref 0.0–7.0)

## 2018-06-24 MED ORDER — PEN NEEDLES 30G X 8 MM MISC: 1.0000 "application " | Freq: Every day | 5 refills | Status: DC

## 2018-06-24 MED ORDER — INSULIN LISPRO 100 UNIT/ML (KWIKPEN): PEN_INJECTOR | SUBCUTANEOUS | 11 refills | Status: DC

## 2018-06-24 MED ORDER — LISINOPRIL-HYDROCHLOROTHIAZIDE 20-25 MG PO TABS: 2.0000 | ORAL_TABLET | Freq: Every day | ORAL | 5 refills | Status: DC

## 2018-06-24 NOTE — Patient Instructions (Addendum)
Sleep Apnea Sleep apnea is a condition that affects breathing. People with sleep apnea have moments during sleep when their breathing pauses briefly or gets shallow. Sleep apnea can cause these symptoms:  Trouble staying asleep.  Sleepiness or tiredness during the day.  Irritability.  Loud snoring.  Morning headaches.  Trouble concentrating.  Forgetting things.  Less interest in sex.  Being sleepy for no reason.  Mood swings.  Personality changes.  Depression.  Waking up a lot during the night to pee (urinate).  Dry mouth.  Sore throat.  Follow these instructions at home:  Make any changes in your routine that your doctor recommends.  Eat a healthy, well-balanced diet.  Take over-the-counter and prescription medicines only as told by your doctor.  Avoid using alcohol, calming medicines (sedatives), and narcotic medicines.  Take steps to lose weight if you are overweight.  If you were given a machine (device) to use while you sleep, use it only as told by your doctor.  Do not use any tobacco products, such as cigarettes, chewing tobacco, and e-cigarettes. If you need help quitting, ask your doctor.  Keep all follow-up visits as told by your doctor. This is important. Contact a doctor if:  The machine that you were given to use during sleep is uncomfortable or does not seem to be working.  Your symptoms do not get better.  Your symptoms get worse. Get help right away if:  Your chest hurts.  You have trouble breathing in enough air (shortness of breath).  You have an uncomfortable feeling in your back, arms, or stomach.  You have trouble talking.  One side of your body feels weak.  A part of your face is hanging down (drooping). These symptoms may be an emergency. Do not wait to see if the symptoms will go away. Get medical help right away. Call your local emergency services (911 in the [place] Do not drive yourself to the hospital. This information  is not intended to replace advice given to you by your health care provider. Make sure you discuss any questions you have with your health care provider. Document Released: 09/24/2008 Document Revised: 08/11/2016 Document Reviewed: 09/25/2015 Elsevier Interactive Patient Education  2018 [employer].    Diabetes and Foot Care Diabetes may cause you to have problems because of poor blood supply (circulation) to your feet and legs. This may cause the skin on your feet to become thinner, break easier, and heal more slowly. Your skin may become dry, and the skin may peel and crack. You may also have nerve damage in your legs and feet causing decreased feeling in them. You may not notice minor injuries to your feet that could lead to infections or more serious problems. Taking care of your feet is one of the most important things you can do for yourself. Follow these instructions at home:  Wear shoes at all times, even in the house. Do not go barefoot. Bare feet are easily injured.  Check your feet daily for blisters, cuts, and redness. If you cannot see the bottom of your feet, use a mirror or ask someone for help.  Wash your feet with warm water (do not use hot water) and mild soap. Then pat your feet and the areas between your toes until they are completely dry. Do not soak your feet as this can dry your skin.  Apply a moisturizing lotion or petroleum jelly (that does not contain alcohol and is unscented) to the skin on your feet and  to dry, brittle toenails. Do not apply lotion between your toes.  Trim your toenails straight across. Do not dig under them or around the cuticle. File the edges of your nails with an emery board or nail file.  Do not cut corns or calluses or try to remove them with medicine.  Wear clean socks or stockings every day. Make sure they are not too tight. Do not wear knee-high stockings since they may decrease blood flow to your legs.  Wear shoes that fit properly and  have enough cushioning. To break in new shoes, wear them for just a few hours a day. This prevents you from injuring your feet. Always look in your shoes before you put them on to be sure there are no objects inside.  Do not cross your legs. This may decrease the blood flow to your feet.  If you find a minor scrape, cut, or break in the skin on your feet, keep it and the skin around it clean and dry. These areas may be cleansed with mild soap and water. Do not cleanse the area with peroxide, alcohol, or iodine.  When you remove an adhesive bandage, be sure not to damage the skin around it.  If you have a wound, look at it several times a day to make sure it is healing.  Do not use heating pads or hot water bottles. They may burn your skin. If you have lost feeling in your feet or legs, you may not know it is happening until it is too late.  Make sure your health care provider performs a complete foot exam at least annually or more often if you have foot problems. Report any cuts, sores, or bruises to your health care provider immediately. Contact a health care provider if:  You have an injury that is not healing.  You have cuts or breaks in the skin.  You have an ingrown nail.  You notice redness on your legs or feet.  You feel burning or tingling in your legs or feet.  You have pain or cramps in your legs and feet.  Your legs or feet are numb.  Your feet always feel cold. Get help right away if:  There is increasing redness, swelling, or pain in or around a wound.  There is a red line that goes up your leg.  Pus is coming from a wound.  You develop a fever or as directed by your health care provider.  You notice a bad smell coming from an ulcer or wound. This information is not intended to replace advice given to you by your health care provider. Make sure you discuss any questions you have with your health care provider. Document Released: 12/13/2000 Document Revised:  05/23/2016 Document Reviewed: 05/25/2013 Elsevier Interactive Patient Education  2017 [employer].

## 2018-06-24 NOTE — Progress Notes (Signed)
Subjective:  Patient ID: Raymond Ryan, male    DOB: 1966/03/22  Age: 52 y.o. MRN: [id number]  CC: No chief complaint on file.   HPI Raymond Mesta Wilsonis a 52 y.o.malewith a medical history of DM2, GERD, CKD3, and hypertension presents to f/u on uncontrolled DM2. Seen here approximately 10 weeks ago and found to have an A1c of 11.8%. He was asked to return in 4 weeks for management of DM and HTN but failed to do so. He was prescribed Metformin 1000 mg BID, Sitalgliptin 100 mg, Basaglar 60 units qhs, and Prinzide 10-12.5 mg two tablets qday at his last visit. BP 51/96 mmHg at his last visit. BP 141/96 mmHg today. A1c 12.0% today. Reports taking medications as prescribed except for dividing Basaglar "35-40 units at night and 20 units in the morning". Does not consume much carbs. Does not exercise regularly. Checks blood sugars once per day. Reports 180-190. Endorses fatigue,  tingling and numbness of the feet. Says he never goes barefoot. Does not endorse polydipsia, polyuria, visual blurring, CP, palpitations, SOB, HA, abdominal pain, f/c/n/v, rash, or GI/GU sxs.     Outpatient Medications Prior to Visit  Medication Sig Dispense Refill  . atorvastatin (LIPITOR) 20 MG tablet Take 1 tablet (20 mg total) by mouth daily. 30 tablet 3  . gabapentin (NEURONTIN) 300 MG capsule Take 1 capsule (300 mg total) by mouth at bedtime as needed. 30 capsule 3  . glipiZIDE (GLUCOTROL) 10 MG tablet Take by mouth.    . Insulin Glargine (BASAGLAR KWIKPEN) 100 UNIT/ML SOPN Inject 0.6 mLs (60 Units total) into the skin at bedtime. 5 pen 6  . Insulin Pen Needle (PEN NEEDLES) 30G X 8 MM MISC 1 application by Does not apply route daily. 30 each 2  . Lancets (ACCU-CHEK SOFT TOUCH) lancets Use one new lancet three times per day. Do not reuse lancets. 100 each 2  . lisinopril-hydrochlorothiazide (PRINZIDE,ZESTORETIC) 20-25 MG tablet Take 1 tablet by mouth daily. 90 tablet 0  . metFORMIN (GLUCOPHAGE) 1000 MG tablet  Take 1 tablet (1,000 mg total) by mouth 2 (two) times daily with a meal. 180 tablet 3  . sitaGLIPtin (JANUVIA) 100 MG tablet Take 1 tablet (100 mg total) by mouth daily. 30 tablet 11   Facility-Administered Medications Prior to Visit  Medication Dose Route Frequency Provider Last Rate Last Dose  . 0.9 %  sodium chloride infusion  500 mL Intravenous Once Nandigam, Venia Minks, MD         ROS Review of Systems  Constitutional: Positive for malaise/fatigue. Negative for chills and fever.  Eyes: Negative for blurred vision.  Respiratory: Negative for shortness of breath.   Cardiovascular: Negative for chest pain and palpitations.  Gastrointestinal: Negative for abdominal pain and nausea.  Genitourinary: Negative for dysuria and hematuria.  Musculoskeletal: Negative for joint pain and myalgias.  Skin: Negative for rash.  Neurological: Negative for tingling and headaches.  Psychiatric/Behavioral: Negative for depression. The patient is not nervous/anxious.     Objective:     Physical Exam  Constitutional: He is oriented to person, place, and time.  Well developed, obese, NAD, polite  HENT:  Head: Normocephalic and atraumatic.  Eyes: No scleral icterus.  Neck: Normal range of motion. Neck supple. No thyromegaly present.  Cardiovascular: Normal rate, regular rhythm and normal heart sounds.  Pulmonary/Chest: Effort normal and breath sounds normal.  Musculoskeletal: He exhibits no edema.  Neurological: He is alert and oriented to person, place, and time.  Skin: Skin is  warm and dry. No rash noted. No erythema. No pallor.  Psychiatric: He has a normal mood and affect. His behavior is normal. Thought content normal.  Vitals reviewed.    Assessment & Plan:   1. Type 2 diabetes mellitus with stage 3 chronic kidney disease, with long-term current use of insulin (HCC) - POCT glycosylated hemoglobin (Hb A1C) 12.0% today. - Ambulatory referral to Endocrinology - insulin lispro (HUMALOG  KWIKPEN) 100 UNIT/ML KiwkPen; If sugar 150-200 take 2 units If sugar 201-251 take 4 units If sugar 251-300 take 6 units If sugar 301-350 take 8 units If sugar 351-400 take 10 units  Dispense: 15 mL; Refill: 11 - Insulin Pen Needle (PEN NEEDLES) 30G X 8 MM MISC; 1 application by Does not apply route daily.  Dispense: 150 each; Refill: 5 - Basic Metabolic Panel  2. Sleep disturbance - Stop Bang score 7/8.  - Nocturnal polysomnography (NPSG); Future  3. Administrative encounter - Letter written for [place] [employer]   Meds ordered this encounter  Medications  . lisinopril-hydrochlorothiazide (PRINZIDE,ZESTORETIC) 20-25 MG tablet    Sig: Take 2 tablets by mouth daily.    Dispense:  60 tablet    Refill:  5    To replace 10-12.5 mg 2 tablets daily    Order Specific Question:   Supervising Provider    Answer:   Charlott Rakes [[id number]]  . insulin lispro (HUMALOG KWIKPEN) 100 UNIT/ML KiwkPen    Sig: If sugar 150-200 take 2 units If sugar 201-251 take 4 units If sugar 251-300 take 6 units If sugar 301-350 take 8 units If sugar 351-400 take 10 units    Dispense:  15 mL    Refill:  11    Order Specific Question:   Supervising Provider    Answer:   Charlott Rakes [[id number]]  . Insulin Pen Needle (PEN NEEDLES) 30G X 8 MM MISC    Sig: 1 application by Does not apply route daily.    Dispense:  150 each    Refill:  5    Order Specific Question:   Supervising Provider    Answer:   Charlott Rakes [[id number]]    Follow-up: Return in about 3 months (around 09/24/2018) for diabetes and HTN.   Clent Demark PA

## 2018-06-25 ENCOUNTER — Telehealth (INDEPENDENT_AMBULATORY_CARE_PROVIDER_SITE_OTHER): Payer: Self-pay

## 2018-06-25 LAB — BASIC METABOLIC PANEL
BUN/Creatinine Ratio: 15 (ref 9–20)
BUN: 26 mg/dL — AB (ref 6–24)
CALCIUM: 10.2 mg/dL (ref 8.7–10.2)
CO2: 19 mmol/L — AB (ref 20–29)
Chloride: 103 mmol/L (ref 96–106)
Creatinine, Ser: 1.68 mg/dL — ABNORMAL HIGH (ref 0.76–1.27)
GFR calc Af Amer: 54 mL/min/{1.73_m2} — ABNORMAL LOW (ref >59)
GFR, EST NON AFRICAN AMERICAN: 47 mL/min/{1.73_m2} — AB (ref >59)
Glucose: 232 mg/dL — ABNORMAL HIGH (ref 65–99)
Potassium: 5 mmol/L (ref 3.5–5.2)
Sodium: 139 mmol/L (ref 134–144)

## 2018-06-25 NOTE — Telephone Encounter (Signed)
-----   Message from Clent Demark, [name] sent at 06/25/2018  8:42 AM EDT ----- Chronic kidney disease. Needs to control diabetes.

## 2018-06-25 NOTE — Telephone Encounter (Signed)
Patient is aware of chronic kidney disease and the need to control his diabetes. Patient expressed understanding. Nat Christen, CMA

## 2018-07-01 ENCOUNTER — Ambulatory Visit: Payer: BC Managed Care – PPO | Admitting: Family Medicine

## 2018-07-01 ENCOUNTER — Encounter: Payer: Self-pay | Admitting: Family Medicine

## 2018-07-01 VITALS — BP 115/82 | Ht 76.0 in | Wt 280.0 lb

## 2018-07-01 DIAGNOSIS — E11610 Type 2 diabetes mellitus with diabetic neuropathic arthropathy: Secondary | ICD-10-CM | POA: Diagnosis not present

## 2018-07-01 NOTE — Progress Notes (Signed)
Chief complaint: Right foot anatomic abnormality, requesting orthotics or script  History of present illness: Raymond Ryan is a 52 year old male who presents to sports medicine office today with chief complaint of right foot anatomic abnormality.  About 5 years ago, he was diagnosed with Charcot foot on the right side.  He has complete longitudinal collapse of the right foot with midfoot bony prominence on plantar aspect. This happened after an acute collapse of his right foot while he was at work. He did come to the office here shortly afterwards after being kindly referred from his primary physician. He saw Dr. Maryln Gottron here, who recommended him seeing [facility] for custom orthotics. She also recommended him to have specialty evaluation at [facility]. He reports that he did see the foot and ankle specialists at [facility]. He reports that they did talk about a few surgical interventions, but given his uncontrolled diabetes, he would be at high risk. Supportive management was given. He has had orthotics made at [facility] and has been very pleased with this.  He reports that the old orthotics have worn out any try to get appointment at [facility] to get new orthotics.  They did request him to get new prescription from the office to be able to do that.  Fortunately, he does not report of any foot pain today.  His only request is just to have prescription for orthotics. He went to [facility] yesterday, was told that it would be over $300, which he thought was too expensive for the orthotics. He does not report of any other issues to address today.  He does have diabetic peripheral neuropathy to which he is on gabapentin.  He does not report of any falls.  Review of systems:  As stated above  His past medical history, surgical history, family history, and social history obtained and reviewed.  His past medical history is notable for hypertension, uncontrolled type 2 diabetes on insulin, diabetic neuropathy, and hyperlipidemia;  he does not report of any surgeries/operations; he does not report of any current tobacco use; family history is notable for type 2 diabetes, CAD, and prostate cancer; allergies and medications have been reviewed and are reflected in EMR.  Physical exam: Vital signs are reviewed and are documented in the chart Gen.: Alert, oriented, appears stated age, in no apparent distress HEENT: Moist oral mucosa Respiratory: Normal respirations, able to speak in full sentences Cardiac: Regular rate, distal pulses 2+ Integumentary: No rashes on visible skin:  Neurologic: Strength 5/5 with bilateral ankle dorsiflexion, plantar flexion, inversion, eversion, sensation 2+ on dorsum of both feet, slightly diminished on the plantar aspect more medially on both feet to light touch Psych: Normal affect, mood is described as good Musculoskeletal: Inspection of his right foot reveals that he does have obvious longitudinal collapse with classic findings suggestive of Charcot foot, he does have midfoot bony prominence on the plantar aspect of his right foot over the medial arch, no warmth, erythema, ecchymosis, or effusion, no tenderness to palpation anywhere in his right foot today, on ambulation he does have obvious collapse of the longitudinal arch and does have pronation on ambulation, he does have slight collapse of the transverse arch, he does have unremarkable appearing left foot with normal arch on the left foot  Assessment and plan: 1.  Right Charcot foot 2.  Uncontrolled type 2 diabetes, on insulin 3.  Diabetic peripheral neuropathy   Plan:  Given the success that he has had with the [facility] orthotics and has not had  any reoccurrence of pain since that time, do feel that next best step would be to have  orthotics made by them. Prescription script was given to him today. He will follow up here otherwise on as needed basis.    Mort Sawyers, M.D. [facility]

## 2018-07-10 ENCOUNTER — Ambulatory Visit (INDEPENDENT_AMBULATORY_CARE_PROVIDER_SITE_OTHER): Payer: BC Managed Care – PPO | Admitting: Physician Assistant

## 2018-07-10 ENCOUNTER — Encounter (INDEPENDENT_AMBULATORY_CARE_PROVIDER_SITE_OTHER): Payer: Self-pay | Admitting: Physician Assistant

## 2018-07-10 ENCOUNTER — Other Ambulatory Visit: Payer: Self-pay

## 2018-07-10 VITALS — BP 132/91 | HR 94 | Temp 99.0°F | Ht 76.0 in | Wt 284.4 lb

## 2018-07-10 DIAGNOSIS — Z79899 Other long term (current) drug therapy: Secondary | ICD-10-CM

## 2018-07-10 DIAGNOSIS — R9389 Abnormal findings on diagnostic imaging of other specified body structures: Secondary | ICD-10-CM | POA: Diagnosis not present

## 2018-07-10 MED ORDER — INSULIN ASPART 100 UNIT/ML ~~LOC~~ SOLN: SUBCUTANEOUS | 11 refills | Status: DC

## 2018-07-10 NOTE — Progress Notes (Signed)
Subjective:  Patient ID: Raymond Ryan, male    DOB: 19-Oct-1966  Age: 52 y.o. MRN: [id number]  CC: abnormal xray  HPI Raymond Havens Wilsonis a 52 y.o.malewith a medical history of DM2, GERD,CKD3,and hypertension presents to with chiropractor xray report that revealed an incidental finding of cavitary lesion in the left lung in the level of the eight rib. Pt requests further imaging. Worried he has cancer. Does not endorse any current respiratory issues. No other symptoms or complaints. Needs a change from Humalog to Novolog since his insurance does not cover Humalog.     Outpatient Medications Prior to Visit  Medication Sig Dispense Refill  . atorvastatin (LIPITOR) 20 MG tablet Take 1 tablet (20 mg total) by mouth daily. 30 tablet 3  . gabapentin (NEURONTIN) 300 MG capsule Take 1 capsule (300 mg total) by mouth at bedtime as needed. 30 capsule 3  . Insulin Glargine (BASAGLAR KWIKPEN) 100 UNIT/ML SOPN Inject 0.6 mLs (60 Units total) into the skin at bedtime. 5 pen 6  . insulin lispro (HUMALOG KWIKPEN) 100 UNIT/ML KiwkPen If sugar 150-200 take 2 units If sugar 201-251 take 4 units If sugar 251-300 take 6 units If sugar 301-350 take 8 units If sugar 351-400 take 10 units 15 mL 11  . Insulin Pen Needle (PEN NEEDLES) 30G X 8 MM MISC 1 application by Does not apply route daily. 150 each 5  . Lancets (ACCU-CHEK SOFT TOUCH) lancets Use one new lancet three times per day. Do not reuse lancets. 100 each 2  . lisinopril-hydrochlorothiazide (PRINZIDE,ZESTORETIC) 20-25 MG tablet Take 2 tablets by mouth daily. 60 tablet 5  . metFORMIN (GLUCOPHAGE) 1000 MG tablet Take 1 tablet (1,000 mg total) by mouth 2 (two) times daily with a meal. 180 tablet 3  . sitaGLIPtin (JANUVIA) 100 MG tablet Take 1 tablet (100 mg total) by mouth daily. 30 tablet 11   Facility-Administered Medications Prior to Visit  Medication Dose Route Frequency Provider Last Rate Last Dose  . 0.9 %  sodium chloride infusion  500  mL Intravenous Once Nandigam, Kavitha V, MD         ROS Review of Systems  Constitutional: Negative for chills, fever and malaise/fatigue.  Eyes: Negative for blurred vision.  Respiratory: Negative for shortness of breath.   Cardiovascular: Negative for chest pain and palpitations.  Gastrointestinal: Negative for abdominal pain and nausea.  Genitourinary: Negative for dysuria and hematuria.  Musculoskeletal: Negative for joint pain and myalgias.  Skin: Negative for rash.  Neurological: Negative for tingling and headaches.  Psychiatric/Behavioral: Negative for depression. The patient is nervous/anxious.     Objective:  BP (!) 132/91 (BP Location: Left Arm, Patient Position: Sitting, Cuff Size: Large)   Pulse 94   Temp 99 F (37.2 C) (Oral)   Ht 6\' 4"  (1.93 m)   Wt 284 lb 6.4 oz (129 kg)   SpO2 97%   BMI 34.62 kg/m   BP/Weight 07/10/2018 07/01/2018 04/30/7740  Systolic BP 287 867 672  Diastolic BP 91 82 96  Wt. (Lbs) 284.4 280 280.8  BMI 34.62 34.08 34.18      Physical Exam  Constitutional: He is oriented to person, place, and time.  Well developed, overweight, NAD, polite  HENT:  Head: Normocephalic and atraumatic.  Eyes: No scleral icterus.  Neck: Normal range of motion. Neck supple. No thyromegaly present.  Cardiovascular: Normal rate, regular rhythm and normal heart sounds. Exam reveals no gallop and no friction rub.  No murmur heard. Pulmonary/Chest: Effort normal  and breath sounds normal. No stridor. No respiratory distress. He has no wheezes. He has no rales.  Musculoskeletal: He exhibits no edema.  Neurological: He is alert and oriented to person, place, and time.  Skin: Skin is warm and dry. No rash noted. No erythema. No pallor.  Psychiatric: His behavior is normal. Thought content normal.  Somewhat anxious  Vitals reviewed.    Assessment & Plan:   1. Abnormal chest x-ray - CT CHEST W WO CONTRAST; Future  2. Medication therapy changed - insulin  aspart (NOVOLOG) 100 UNIT/ML injection; If sugar 150-200 take 2 units If sugar 201-251 take 4 units If sugar 251-300 take 6 units If sugar 301-350 take 8 units If sugar 351-400 take 10 units  Dispense: 10 mL; Refill: 11   Meds ordered this encounter  Medications  . insulin aspart (NOVOLOG) 100 UNIT/ML injection    Sig: If sugar 150-200 take 2 units If sugar 201-251 take 4 units If sugar 251-300 take 6 units If sugar 301-350 take 8 units If sugar 351-400 take 10 units    Dispense:  10 mL    Refill:  11    ICD 10 - E11.10    Order Specific Question:   Supervising Provider    Answer:   Charlott Rakes [[id number]]    Follow-up: Return if symptoms worsen or fail to improve, for keep previously made appt for DM2 f/u.   Clent Demark PA

## 2018-07-15 ENCOUNTER — Other Ambulatory Visit (INDEPENDENT_AMBULATORY_CARE_PROVIDER_SITE_OTHER): Payer: Self-pay | Admitting: Physician Assistant

## 2018-07-15 ENCOUNTER — Other Ambulatory Visit: Payer: Self-pay

## 2018-07-15 MED ORDER — INSULIN SYRINGES (DISPOSABLE) U-100 0.3 ML MISC
1.0000 | Freq: Three times a day (TID) | 11 refills | Status: DC
Start: 2018-07-15 — End: 2020-09-26

## 2018-07-15 NOTE — Telephone Encounter (Signed)
Pharmacy is asking that we re-send the novolog for the flex pens

## 2018-07-16 ENCOUNTER — Other Ambulatory Visit: Payer: Self-pay | Admitting: Family Medicine

## 2018-07-22 ENCOUNTER — Ambulatory Visit (INDEPENDENT_AMBULATORY_CARE_PROVIDER_SITE_OTHER): Payer: BC Managed Care – PPO | Admitting: Physician Assistant

## 2018-07-22 ENCOUNTER — Encounter

## 2018-07-23 ENCOUNTER — Ambulatory Visit (HOSPITAL_BASED_OUTPATIENT_CLINIC_OR_DEPARTMENT_OTHER): Payer: BC Managed Care – PPO | Attending: Physician Assistant | Admitting: Internal Medicine

## 2018-07-23 VITALS — Ht 72.0 in | Wt 280.0 lb

## 2018-07-23 DIAGNOSIS — R5383 Other fatigue: Secondary | ICD-10-CM | POA: Diagnosis not present

## 2018-07-23 DIAGNOSIS — Z6841 Body Mass Index (BMI) 40.0 and over, adult: Secondary | ICD-10-CM | POA: Diagnosis not present

## 2018-07-23 DIAGNOSIS — E119 Type 2 diabetes mellitus without complications: Secondary | ICD-10-CM | POA: Insufficient documentation

## 2018-07-23 DIAGNOSIS — E669 Obesity, unspecified: Secondary | ICD-10-CM | POA: Insufficient documentation

## 2018-07-23 DIAGNOSIS — G479 Sleep disorder, unspecified: Secondary | ICD-10-CM

## 2018-07-23 DIAGNOSIS — R0683 Snoring: Secondary | ICD-10-CM | POA: Diagnosis not present

## 2018-08-01 DIAGNOSIS — G479 Sleep disorder, unspecified: Secondary | ICD-10-CM | POA: Diagnosis not present

## 2018-08-01 NOTE — Procedures (Signed)
    Patient Name: Raymond Ryan, Raymond Ryan Date: 07/23/2018 Gender: Male D.O.B: 1966/12/27 Age (years): 33 Referring Provider: Clent Demark PA Height (inches): 66 Interpreting Physician: Baird Lyons MD, ABSM Weight (lbs): 280 RPSGT: Baxter Flattery BMI: 40 MRN: [id number] Neck Size: 19.50  CLINICAL INFORMATION Sleep Study Type: NPSG Indication for sleep study: Diabetes, Fatigue, Obesity, Snoring, Witnesses Apnea / Gasping During Sleep  Epworth Sleepiness Score: 3  SLEEP STUDY TECHNIQUE As per the AASM Manual for the Scoring of Sleep and Associated Events v2.3 (April 2016) with a hypopnea requiring 4% desaturations.  The channels recorded and monitored were frontal, central and occipital EEG, electrooculogram (EOG), submentalis EMG (chin), nasal and oral airflow, thoracic and abdominal wall motion, anterior tibialis EMG, snore microphone, electrocardiogram, and pulse oximetry.  MEDICATIONS Medications self-administered by patient taken the night of the study : none reported  SLEEP ARCHITECTURE The study was initiated at 10:33:01 PM and ended at 4:44:19 AM.  Sleep onset time was 19.8 minutes and the sleep efficiency was 59.5%%. The total sleep time was 221 minutes.  Stage REM latency was 147.5 minutes.  The patient spent 4.1%% of the night in stage N1 sleep, 85.1%% in stage N2 sleep, 0.0%% in stage N3 and 10.9% in REM.  Alpha intrusion was absent.  Supine sleep was 11.31%.  RESPIRATORY PARAMETERS The overall apnea/hypopnea index (AHI) was 4.9 per hour. There were 0 total apneas, including 0 obstructive, 0 central and 0 mixed apneas. There were 18 hypopneas and 0 RERAs.  The AHI during Stage REM sleep was 10.0 per hour.  AHI while supine was 19.2 per hour.  The mean oxygen saturation was 92.1%. The minimum SpO2 during sleep was 84.0%.  loud snoring was noted during this study.  CARDIAC DATA The 2 lead EKG demonstrated sinus rhythm. The mean heart rate was 84.3 beats  per minute. Other EKG findings include: None. LEG MOVEMENT DATA The total PLMS were 0 with a resulting PLMS index of 0.0. Associated arousal with leg movement index was 0.0 .  IMPRESSIONS - No significant obstructive sleep apnea occurred during this study (AHI = 4.9/h). - No significant central sleep apnea occurred during this study (CAI = 0.0/h). - Mild oxygen desaturation was noted during this study (Min O2 = 84.0%). Mean 92.1%. - The patient snored with loud snoring volume. - No cardiac abnormalities were noted during this study. - Clinically significant periodic limb movements did not occur during sleep. No significant associated arousals.  DIAGNOSIS - Primary snoring  RECOMMENDATIONS - Positional therapy avoiding supine position during sleep. - Be careful with alcohol, sedatives and other CNS depressants that may worsen sleep apnea and disrupt normal sleep architecture. - Sleep hygiene should be reviewed to assess factors that may improve sleep quality. - Weight management and regular exercise should be initiated or continued if appropriate.  [Electronically signed] 08/01/2018 02:43 PM  Baird Lyons MD, [place], American Board of Sleep Medicine   NPI: [id number]                         [place], [place] [facility]  ELECTRONICALLY SIGNED ON:  08/01/2018, 2:41 PM [place] PH: [phone]   FX: [phone] [place]

## 2018-08-03 ENCOUNTER — Telehealth (INDEPENDENT_AMBULATORY_CARE_PROVIDER_SITE_OTHER): Payer: Self-pay

## 2018-08-03 ENCOUNTER — Other Ambulatory Visit (INDEPENDENT_AMBULATORY_CARE_PROVIDER_SITE_OTHER): Payer: Self-pay | Admitting: Physician Assistant

## 2018-08-03 DIAGNOSIS — R9389 Abnormal findings on diagnostic imaging of other specified body structures: Secondary | ICD-10-CM

## 2018-08-03 NOTE — Telephone Encounter (Signed)
-----   Message from Clent Demark, [name] sent at 08/03/2018  1:31 PM EDT ----- No significant sleep apnea.

## 2018-08-03 NOTE — Telephone Encounter (Signed)
Patient aware that no significant sleep apnea. Nat Christen, CMA

## 2018-08-13 ENCOUNTER — Other Ambulatory Visit (INDEPENDENT_AMBULATORY_CARE_PROVIDER_SITE_OTHER): Payer: Self-pay | Admitting: Physician Assistant

## 2018-08-13 ENCOUNTER — Ambulatory Visit
Admission: RE | Admit: 2018-08-13 | Discharge: 2018-08-13 | Disposition: A | Discharge status: Home / Self Care | Payer: Self-pay | Source: Ambulatory Visit | Attending: Physician Assistant | Admitting: Physician Assistant

## 2018-08-13 ENCOUNTER — Ambulatory Visit (HOSPITAL_COMMUNITY)
Admission: RE | Admit: 2018-08-13 | Discharge: 2018-08-13 | Disposition: A | Discharge status: Home / Self Care | Payer: BC Managed Care – PPO | Source: Ambulatory Visit | Attending: Physician Assistant | Admitting: Physician Assistant

## 2018-08-13 DIAGNOSIS — R9389 Abnormal findings on diagnostic imaging of other specified body structures: Secondary | ICD-10-CM

## 2018-08-13 DIAGNOSIS — J9811 Atelectasis: Secondary | ICD-10-CM | POA: Diagnosis not present

## 2018-08-13 DIAGNOSIS — C801 Malignant (primary) neoplasm, unspecified: Secondary | ICD-10-CM

## 2018-08-13 DIAGNOSIS — R1909 Other intra-abdominal and pelvic swelling, mass and lump: Secondary | ICD-10-CM | POA: Insufficient documentation

## 2018-08-14 ENCOUNTER — Telehealth (INDEPENDENT_AMBULATORY_CARE_PROVIDER_SITE_OTHER): Payer: Self-pay

## 2018-08-14 NOTE — Telephone Encounter (Signed)
Patient is aware that US reveals fatty hernia, no intestine involved. These are not usually operated on unless they are very large in size. Patient was also informed that CT revealed collapse of portions of the lung; regular exercise can help reinflate. Must watch for shortness of breath, fever, and malaise. No signs of cancer. Patient expressed understanding. Nat Christen, CMA

## 2018-08-14 NOTE — Telephone Encounter (Signed)
-----   Message from Clent Demark, [name] sent at 08/13/2018  6:07 PM EDT ----- Pt seems to have a fatty hernia. No intestine is involved. Usually not operated unless very large in size.

## 2018-08-24 ENCOUNTER — Other Ambulatory Visit (INDEPENDENT_AMBULATORY_CARE_PROVIDER_SITE_OTHER): Payer: Self-pay | Admitting: Physician Assistant

## 2018-08-24 DIAGNOSIS — Z760 Encounter for issue of repeat prescription: Secondary | ICD-10-CM

## 2018-08-24 NOTE — Telephone Encounter (Signed)
FWD to PCP. Rayshard Schirtzinger S Travaris Kosh, CMA g 

## 2018-08-31 ENCOUNTER — Other Ambulatory Visit (INDEPENDENT_AMBULATORY_CARE_PROVIDER_SITE_OTHER): Payer: Self-pay | Admitting: Physician Assistant

## 2018-08-31 DIAGNOSIS — Z79899 Other long term (current) drug therapy: Secondary | ICD-10-CM

## 2018-09-01 NOTE — Telephone Encounter (Signed)
FWD to PCP. Tempestt S Roberts, CMA  

## 2018-09-10 ENCOUNTER — Ambulatory Visit: Payer: BC Managed Care – PPO | Admitting: Endocrinology

## 2018-11-10 NOTE — Progress Notes (Signed)
Patient ID: Raymond Ryan, male   DOB: Sep 20, 1966, 52 y.o.   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  NovoLog occasionally based on blood sugar level    Non-insulin hypoglycemic drugs the patient is taking are: Metformin 1 g daily  Current management, blood sugar patterns and problems identified:  He has difficulty with affording his test strips and is not checking blood sugars much at all and only in the last few days has done and rarely later in the day  Blood sugars except for this morning have been over 300 in the mornings; has only 2 readings late morning and afternoon last week which were as high as 434  He says NovoLog tends to make him feel hungry and he does not take it  Also his insurance apparently does not cover the FlexPen and he is using NovoLog with a syringe  Cannot take more than one metformin a day because of diarrhea  He will sometimes go off his diet with drinking sweet tea or eating fast food and then will have increased thirst  Usually has increased urination at night and also complains of fatigue especially in the evenings          Side effects from  medications have been: Diarrhea from regular metformin     Typical meal intake: Breakfast is oatmeal or cereal or sandwich, lunch usually fast food, dinner salad and sandwich               Exercise: None    Glucose monitoring:  done <1 times a day         Glucometer:  Freestyle.       Blood Glucose readings by time of day as above  Average blood sugar at home 341 with only 8 readings in the last 2 weeks  Dietician visit, most recent: Never  Weight history:  Wt Readings from Last 3 Encounters:  11/11/18 286 lb (129.7 kg)  07/23/18 280 lb (127 kg)  07/10/18 284 lb 6.4 oz (129 kg)    Glycemic control:   Lab Results  Component Value Date   HGBA1C 13.0 (A) 11/11/2018   HGBA1C 12.0 (A) 06/24/2018   HGBA1C 11.8 04/06/2018   Lab Results  Component Value Date   MICROALBUR 1.4 04/24/2016   LDLCALC 131 (H) 12/01/2017   CREATININE 1.68 (H) 06/24/2018   Lab Results  Component Value Date   MICRALBCREAT 8 04/24/2016    No results found for: FRUCTOSAMINE  Office Visit on 11/11/2018  Component Date Value  Ref Range Status  . Hemoglobin A1C 11/11/2018 13.0* 4.0 - 5.6 % Final    Allergies as of 11/11/2018   No Known Allergies     Medication List        Accurate as of 11/11/18  1:25 PM. Always use your most recent med list.          accu-chek soft touch lancets Use one new lancet three times per day. Do not reuse lancets.   atorvastatin 20 MG tablet Commonly known as:  LIPITOR TAKE 1 TABLET BY MOUTH EVERY DAY   BASAGLAR KWIKPEN 100 UNIT/ML Sopn Inject 0.6 mLs (60 Units total) into the skin at bedtime.   FREESTYLE LIBRE 14 DAY READER Devi 1 Device by Does not apply route once for 1 dose.   FREESTYLE LIBRE 14 DAY SENSOR Misc 1 Units by Does not apply route every 14 (fourteen) days.   gabapentin 300 MG capsule Commonly known as:  NEURONTIN Take 1 capsule (300 mg total) by mouth at bedtime as needed.   insulin aspart 100 UNIT/ML FlexPen Commonly known as:   NOVOLOG 20 units before each meal   Insulin Pen Needle 31G X 5 MM Misc Use for insulin 4 times a day   Insulin Syringes (Disposable) U-100 0.3 ML Misc Inject 1 each into the skin 3 (three) times daily.   lisinopril-hydrochlorothiazide 20-25 MG tablet Commonly known as:  PRINZIDE,ZESTORETIC Take 2 tablets by mouth daily.   Semaglutide(0.25 or 0.5MG /DOS) 2 MG/1.5ML Sopn Inject 0.5 mg into the skin once a week.       Allergies: No Known Allergies  Past Medical History:  Diagnosis Date  . Charcot foot due to diabetes mellitus ([place])   . Constipation    sometimes soft, some hard -- does go daily - are on fiber   . Diabetes mellitus   . GERD (gastroesophageal reflux disease)   . Hyperlipidemia   . Hypertension   . Neuromuscular disorder ([place])    neuropathy    Past Surgical History:  Procedure Laterality Date  . UPPER GASTROINTESTINAL ENDOSCOPY  2002   Dr Collene Mares     Family History  Problem Relation Age of Onset  . Diabetes Mellitus II Mother   . CAD Mother   . Prostate cancer Father   . Prostate cancer Maternal Uncle   . Prostate cancer Maternal Uncle   . Colon cancer Neg Hx   . Colon polyps Neg Hx     Social History:  reports that he has never smoked. He has never used smokeless tobacco. He reports that he does not drink alcohol or use drugs.   Review of Systems  Constitutional: Negative for weight loss.  HENT: Negative for headaches.   Eyes: Negative for blurred vision.  Respiratory: Negative for shortness of breath.   Cardiovascular: Negative for leg swelling.  Gastrointestinal:       Diarrhea only if taking more than one metformin  Endocrine: Positive for fatigue and erectile dysfunction.  Genitourinary: Positive for nocturia.  Musculoskeletal: Negative for joint pain.  Skin: Negative for rash.  Neurological: Negative for weakness, numbness, tingling and balance difficulty.       Occasional sharp twinges of pain in his legs  Psychiatric/Behavioral:  Negative for insomnia.   Takes gabapentin only occasionally for sharp pains  Lipid history: Has been treated by PCP with 20 mg atorvastatin    Lab Results  Component Value Date   CHOL 214 (H) 12/01/2017   HDL 33 (L) 12/01/2017   LDLCALC 131 (H)  12/01/2017   TRIG 250 (H) 12/01/2017   CHOLHDL 6.5 (H) 12/01/2017           Hypertension: Has been present  BP Readings from Last 3 Encounters:  11/11/18 132/88  07/10/18 (!) 132/91  07/01/18 115/82    Most recent eye exam was in 2/19  Most recent foot exam: 11/19  Currently known complications of diabetes: Neuropathy, erectile dysfunction, unknown status of nephropathy  Has a renal dysfunction of unclear etiology  Lab Results  Component Value Date   CREATININE 1.68 (H) 06/24/2018   CREATININE 1.54 (H) 12/01/2017   CREATININE 1.51 (H) 01/22/2017     LABS:  Office Visit on 11/11/2018  Component Date Value Ref Range Status  . Hemoglobin A1C 11/11/2018 13.0* 4.0 - 5.6 % Final    Physical Examination:  BP 132/88   Pulse 86   Ht 6\' 4"  (1.93 m)   Wt 286 lb (129.7 kg)   SpO2 98%   BMI 34.81 kg/m   GENERAL:         Patient has generalized obesity.    HEENT:         Eye exam shows normal external appearance.  Fundus exam shows no retinopathy.  Oral exam shows normal mucosa .   NECK:   There is no lymphadenopathy  Thyroid is not enlarged and no nodules felt.   Carotids are normal to palpation and no bruit heard  LUNGS:         Chest is symmetrical. Lungs are clear to auscultation.Marland Kitchen   HEART:         Heart sounds:  S1 and S2 are normal. No murmur or click heard., no S3 or S4.   ABDOMEN:   There is no distention present. Liver and spleen are not palpable.  No other mass or tenderness present.    NEUROLOGICAL:   Ankle jerks are absent bilaterally.    Diabetic Foot Exam - Simple   Simple Foot Form Diabetic Foot exam was performed with the following findings:  Yes 11/11/2018 12:11 PM  Visual Inspection No  deformities, no ulcerations, no other skin breakdown bilaterally:  Yes See comments:  Yes Sensation Testing See comments:  Yes Pulse Check Posterior Tibialis and Dorsalis pulse intact bilaterally:  Yes Comments Onychomycosis right great toenail present Absent monofilament sensation in distal toes and plantar surfaces            Vibration sense is absent in distal first toes.  MUSCULOSKELETAL:  There is no swelling or deformity of the peripheral joints.     EXTREMITIES:     There is no ankle edema.  SKIN:       No rash or lesions of concern.        ASSESSMENT:  Diabetes type 2 with BMI 35  See history of present illness for detailed discussion of current diabetes management, blood sugar patterns and problems identified  Recent A1c indicates poor control which has been persistent for about a year at least He is insulin deficient and also insulin resistant  Current treatment regimen is mostly basal insulin with only low-dose of metformin Blood sugars are averaging well over 300 at home and probably higher postprandially  He has not been getting enough insulin and no mealtime coverage Apparently previously had some benefit with Victoza especially with limiting his weight gain and improving satiety and is again a good candidate for adding a GLP-1 drug  Complications of diabetes: Neuropathy with distal sensory loss, erectile dysfunction and 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 Glucose monitoring: If he is able to do the freestyle [place] approved he can start using this . Patient advised to check readings consistently before meals, fasting and also some 2 hours after meals . If he is not able to use a freestyle Elenor Legato he will check to see which brand his insurance will cover  2.  Diabetes education: . Patient will need meal planning and he wants to wait until he find out how much it will cost  3.  Lifestyle changes: . Dietary changes: Cut back on fast foods especially fried food  and drinks with sugar . Exercise regimen: Recommended that he start exercising at the gym but he is limited by his symptoms of fatigue from his hyperglycemia  4.  Medication changes needed: . Increase BASAGLAR up to 80 units and if needed up to 90 units if fasting blood sugars are high in 1 week . Hold off on metformin until renal function is checked today . Start 20 units of NovoLog empirically with every meal and given him a co-pay card with prescription for NovoLog FlexPen to use regularly regardless of blood sugar level Discussed with the patient the nature of GLP-1 drugs, the action on various organ systems, improved satiety, how they benefit blood glucose control, as well as the benefit of weight loss. Explained possible side effects of OZEMPIC, most commonly nausea that usually improves over time; discussed safety information in package insert.  Demonstrated the medication injection device and injection technique to the patient.  Showed patient possible injection sites To start with 0.25 mg dosage weekly for the first 3-4 injections and then go up to 0.5 mg Patient brochure on Ozempic with enclosed co-pay card given  5.  Preventive care needed: Microalbumin to be checked today . Annual eye exams and foot care  6.  Follow-up: 1 month  Detailed instructions given and reviewed  Counseling time on subjects discussed in assessment and plan sections is over 50% of today's 60 minute visit   Patient Instructions  If you are able to get the freestyle libre sensor you may start this or have the nurse instruct you on how to use it If this is not covered please check with your insurance about the preferred GLUCOSE METER  Check blood sugars on waking up 5 days a week  Also check blood sugars before each meal and once a day about 2 hours after meals and do this after different meals by rotation  Recommended blood sugar levels on waking up are 90-130 and about 2 hours after meal is  130-160  Please bring your blood sugar monitor to each visit, thank you  Start OZEMPIC injections by dialing 0.25 mg on the pen as shown once weekly on the same day of the week.   You may inject in the sides of the stomach, outer thigh or arm as indicated in the brochure given. If you have any difficulties using the pen see the video at CompPlans.[email or web address]  You will feel fullness of the stomach with starting the medication and should try to keep the portions at meals small.  You may experience nausea in the first few days which usually gets better over time    After 3 weeks increase the dose to 0.5 mg weekly if not having any nausea  If you have any questions or persistent side effects please call the office   You may also talk to a nurse educator with [employer] at [phone] Useful website:  [email or web address]    INSULIN doses:  BASAL glargine: Increase the dose to 80 units After 1 week if the morning sugar is still over 200 increase the dose to 90 units  NOVOLOG insulin: Take 20 units before each meal to keep the blood sugar at least under 200 after eating  DIET: Avoid all fried food and sweet drinks      Consultation note has been sent to the referring physician  Elayne Snare 11/11/2018, 1:25 PM   Note: This office note was prepared with Dragon voice recognition system technology. Any transcriptional errors that result from this process are unintentional.

## 2018-11-11 ENCOUNTER — Ambulatory Visit: Payer: BC Managed Care – PPO | Admitting: Endocrinology

## 2018-11-11 ENCOUNTER — Encounter: Payer: Self-pay | Admitting: Endocrinology

## 2018-11-11 VITALS — BP 132/88 | HR 86 | Ht 76.0 in | Wt 286.0 lb

## 2018-11-11 DIAGNOSIS — E1165 Type 2 diabetes mellitus with hyperglycemia: Secondary | ICD-10-CM

## 2018-11-11 DIAGNOSIS — Z794 Long term (current) use of insulin: Secondary | ICD-10-CM

## 2018-11-11 DIAGNOSIS — E1142 Type 2 diabetes mellitus with diabetic polyneuropathy: Secondary | ICD-10-CM

## 2018-11-11 LAB — COMPREHENSIVE METABOLIC PANEL
ALK PHOS: 70 U/L (ref 39–117)
ALT: 22 U/L (ref 0–53)
AST: 21 U/L (ref 0–37)
Albumin: 4.6 g/dL (ref 3.5–5.2)
BILIRUBIN TOTAL: 0.6 mg/dL (ref 0.2–1.2)
BUN: 26 mg/dL — AB (ref 6–23)
CO2: 29 mEq/L (ref 19–32)
CREATININE: 1.69 mg/dL — AB (ref 0.40–1.50)
Calcium: 10 mg/dL (ref 8.4–10.5)
Chloride: 102 mEq/L (ref 96–112)
GFR: 55.23 mL/min — ABNORMAL LOW (ref >60.00)
GLUCOSE: 133 mg/dL — AB (ref 70–99)
Potassium: 4.1 mEq/L (ref 3.5–5.1)
SODIUM: 139 meq/L (ref 135–145)
TOTAL PROTEIN: 8.3 g/dL (ref 6.0–8.3)

## 2018-11-11 LAB — POCT GLYCOSYLATED HEMOGLOBIN (HGB A1C): HEMOGLOBIN A1C: 13 % — AB (ref 4.0–5.6)

## 2018-11-11 LAB — URINALYSIS, ROUTINE W REFLEX MICROSCOPIC
Bilirubin Urine: NEGATIVE
Hgb urine dipstick: NEGATIVE
Ketones, ur: NEGATIVE
LEUKOCYTES UA: NEGATIVE
NITRITE: NEGATIVE
RBC / HPF: NONE SEEN (ref 0–?)
Specific Gravity, Urine: 1.025 (ref 1.000–1.030)
TOTAL PROTEIN, URINE-UPE24: NEGATIVE
Urine Glucose: 250 — AB
Urobilinogen, UA: 0.2 (ref 0.0–1.0)
pH: 5 (ref 5.0–8.0)

## 2018-11-11 LAB — MICROALBUMIN / CREATININE URINE RATIO
Creatinine,U: 169.9 mg/dL
MICROALB/CREAT RATIO: 2.5 mg/g (ref 0.0–30.0)
Microalb, Ur: 4.2 mg/dL — ABNORMAL HIGH (ref 0.0–1.9)

## 2018-11-11 MED ORDER — FREESTYLE LIBRE 14 DAY SENSOR MISC: 1.0000 [IU] | 4 refills | Status: DC

## 2018-11-11 MED ORDER — SEMAGLUTIDE(0.25 OR 0.5MG/DOS) 2 MG/1.5ML ~~LOC~~ SOPN: 0.5000 mg | PEN_INJECTOR | SUBCUTANEOUS | 2 refills | Status: DC

## 2018-11-11 MED ORDER — INSULIN PEN NEEDLE 31G X 5 MM MISC: 1 refills | Status: DC

## 2018-11-11 MED ORDER — INSULIN ASPART 100 UNIT/ML FLEXPEN: PEN_INJECTOR | SUBCUTANEOUS | 1 refills | Status: DC

## 2018-11-11 MED ORDER — FREESTYLE LIBRE 14 DAY READER DEVI: 1.0000 | Freq: Once | 0 refills | Status: AC

## 2018-11-11 NOTE — Patient Instructions (Addendum)
If you are able to get the freestyle libre sensor you may start this or have the nurse instruct you on how to use it If this is not covered please check with your insurance about the preferred GLUCOSE METER  Check blood sugars on waking up 5 days a week  Also check blood sugars before each meal and once a day about 2 hours after meals and do this after different meals by rotation  Recommended blood sugar levels on waking up are 90-130 and about 2 hours after meal is 130-160  Please bring your blood sugar monitor to each visit, thank you  Start OZEMPIC injections by dialing 0.25 mg on the pen as shown once weekly on the same day of the week.   You may inject in the sides of the stomach, outer thigh or arm as indicated in the brochure given. If you have any difficulties using the pen see the video at [email or web address].[email or web address]  You will feel fullness of the stomach with starting the medication and should try to keep the portions at meals small.  You may experience nausea in the first few days which usually gets better over time    After 3 weeks increase the dose to 0.5 mg weekly if not having any nausea  If you have any questions or persistent side effects please call the office   You may also talk to a nurse educator with [employer] at [phone] Useful website: [email or web address]    INSULIN doses:  BASAL glargine: Increase the dose to 80 units After 1 week if the morning sugar is still over 200 increase the dose to 90 units  NOVOLOG insulin: Take 20 units before each meal to keep the blood sugar at least under 200 after eating  DIET: Avoid all fried food and sweet drinks

## 2018-11-12 ENCOUNTER — Telehealth: Payer: Self-pay | Admitting: Endocrinology

## 2018-11-12 ENCOUNTER — Other Ambulatory Visit: Payer: Self-pay

## 2018-11-12 NOTE — Telephone Encounter (Signed)
Patient called stating insurance will not cover FreeStyle they will cover One Touch. Please Advise, thanks

## 2018-11-12 NOTE — Telephone Encounter (Signed)
Vero Test Strips are also covered.

## 2018-11-13 NOTE — Telephone Encounter (Signed)
LMTCB

## 2018-12-16 ENCOUNTER — Other Ambulatory Visit (INDEPENDENT_AMBULATORY_CARE_PROVIDER_SITE_OTHER): Payer: BC Managed Care – PPO

## 2018-12-16 DIAGNOSIS — Z794 Long term (current) use of insulin: Secondary | ICD-10-CM | POA: Diagnosis not present

## 2018-12-16 DIAGNOSIS — E1165 Type 2 diabetes mellitus with hyperglycemia: Secondary | ICD-10-CM

## 2018-12-16 LAB — COMPREHENSIVE METABOLIC PANEL
ALBUMIN: 4.4 g/dL (ref 3.5–5.2)
ALT: 15 U/L (ref 0–53)
AST: 15 U/L (ref 0–37)
Alkaline Phosphatase: 60 U/L (ref 39–117)
BUN: 18 mg/dL (ref 6–23)
CHLORIDE: 101 meq/L (ref 96–112)
CO2: 34 meq/L — AB (ref 19–32)
CREATININE: 1.59 mg/dL — AB (ref 0.40–1.50)
Calcium: 10.1 mg/dL (ref 8.4–10.5)
GFR: 59.24 mL/min — ABNORMAL LOW (ref >60.00)
Glucose, Bld: 77 mg/dL (ref 70–99)
Potassium: 3.5 mEq/L (ref 3.5–5.1)
SODIUM: 142 meq/L (ref 135–145)
Total Bilirubin: 0.5 mg/dL (ref 0.2–1.2)
Total Protein: 8.1 g/dL (ref 6.0–8.3)

## 2018-12-17 LAB — FRUCTOSAMINE: FRUCTOSAMINE: 333 umol/L — AB (ref 0–285)

## 2018-12-21 ENCOUNTER — Encounter: Payer: Self-pay | Admitting: Endocrinology

## 2018-12-21 ENCOUNTER — Ambulatory Visit (INDEPENDENT_AMBULATORY_CARE_PROVIDER_SITE_OTHER): Payer: BC Managed Care – PPO | Admitting: Endocrinology

## 2018-12-21 ENCOUNTER — Other Ambulatory Visit: Payer: Self-pay

## 2018-12-21 VITALS — BP 122/72 | HR 100 | Ht 76.0 in | Wt 289.4 lb

## 2018-12-21 DIAGNOSIS — I10 Essential (primary) hypertension: Secondary | ICD-10-CM | POA: Diagnosis not present

## 2018-12-21 DIAGNOSIS — Z794 Long term (current) use of insulin: Secondary | ICD-10-CM

## 2018-12-21 DIAGNOSIS — E7800 Pure hypercholesterolemia, unspecified: Secondary | ICD-10-CM

## 2018-12-21 DIAGNOSIS — E1165 Type 2 diabetes mellitus with hyperglycemia: Secondary | ICD-10-CM

## 2018-12-21 MED ORDER — METFORMIN HCL ER 500 MG PO TB24: 1000.0000 mg | ORAL_TABLET | Freq: Every day | ORAL | 3 refills | Status: DC

## 2018-12-21 MED ORDER — GLUCOSE BLOOD VI STRP: 1.0000 | ORAL_STRIP | 3 refills | Status: DC | PRN

## 2018-12-21 NOTE — Patient Instructions (Addendum)
Novolog 20-25 at meals  Metformin 2 at supper   Stay on [place]: if am suars stay >150, go back to 60 Basaglar  Check blood sugars on waking up  5 days a week  Also check blood sugars about 2 hours after meals and do this after different meals by rotation  Recommended blood sugar levels on waking up are 90-130 and about 2 hours after meal is 130-160  Please bring your blood sugar monitor to each visit, thank you

## 2018-12-21 NOTE — Progress Notes (Signed)
Patient ID: Raymond Ryan, male   DOB: Sep 11, 1966, 52 y.o.   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 .  NovoLog 25-30 based on blood sugar level    Non-insulin hypoglycemic drugs the patient is taking are: Metformin 0 g daily.  Ozempic 0.5 mg weekly  Current management, blood sugar patterns and problems identified:  He is able to start checking his blood sugars with the freestyle meter but could not afford the freestyle libre that was prescribed  Although his blood sugars are averaging about 340 in the last visit they are much improved now  Since his last visit has been able to start on Ozempic and did increase it to 0.5 weekly.  He does have significant improved satiety with this and eating smaller portions.  Only has had nausea with the first dosage change  However has not lost any weight  He was told to increase his Basaglar to 80 units but he is taking only 50 units on his own  Also instead of 20 units he is taking as much as 30 units of NovoLog with meals  Currently checking blood sugars mostly fasting and before dinner but irregularly also  He thinks he feels his blood  sugars getting low sometimes at bedtime but has no blood sugar checked after dinner  His fasting glucose in the lab was only 77 but he had a very light meals and evening before  He was told to see the dietitian but he cannot afford this  He was told to cut back on fast food, sweet tea and other high glycemic index foods and drinks and he is trying to do this to some extent  Currently not doing any exercise except for occasional walking        Side effects from medications have been: Diarrhea from regular metformin     Typical meal intake: Breakfast is oatmeal or cereal or sandwich, lunch usually fast food, dinner salad and sandwich               Exercise: None    Glucose monitoring:  done 1-2 times a day         Glucometer:  Freestyle neo.       Blood Glucose readings by time of day as above    PRE-MEAL Fasting Lunch Dinner Bedtime Overall  Glucose range:  147-267  81, 191  95-201    Mean/median:  200   155   172   POST-MEAL PC Breakfast PC Lunch PC Dinner  Glucose range:   ?  Mean/median:        Dietician visit, most recent: Never  Weight history:  Wt Readings from Last 3 Encounters:  12/21/18 289 lb 6.4 oz (131.3 kg)  11/11/18 286 lb (129.7 kg)  07/23/18 280 lb (127 kg)    Glycemic control:   Lab Results  Component Value Date   HGBA1C 13.0 (A) 11/11/2018   HGBA1C 12.0 (A) 06/24/2018   HGBA1C 11.8 04/06/2018   Lab Results  Component Value Date   MICROALBUR 4.2 (H) 11/11/2018   LDLCALC 131 (H) 12/01/2017   CREATININE 1.59 (H) 12/16/2018   Lab Results  Component Value Date   MICRALBCREAT 2.5 11/11/2018    Lab Results  Component Value Date   FRUCTOSAMINE 333 (H) 12/16/2018    Lab on 12/16/2018  Component Date Value Ref Range Status  . Fructosamine 12/16/2018 333* 0 - 285 umol/L Final   Comment: Published reference interval for apparently healthy subjects between age 10 and 49 is 74 - 285 umol/L and in a poorly controlled diabetic population is  228 - 563 umol/L with a mean of 396 umol/L.   Marland Kitchen Sodium 12/16/2018 142  135 - 145 mEq/L Final  . Potassium 12/16/2018 3.5  3.5 - 5.1 mEq/L Final  . Chloride 12/16/2018 101  96 - 112 mEq/L Final  . CO2 12/16/2018 34* 19 - 32 mEq/L Final  . Glucose, Bld 12/16/2018 77  70 - 99 mg/dL Final  . BUN 12/16/2018 18  6 - 23 mg/dL Final  . Creatinine, Ser 12/16/2018 1.59* 0.40 - 1.50 mg/dL Final  . Total Bilirubin 12/16/2018 0.5  0.2 - 1.2 mg/dL Final  . Alkaline Phosphatase 12/16/2018 60  39 - 117 U/L Final  . AST 12/16/2018 15  0 - 37 U/L Final  . ALT 12/16/2018 15  0 - 53 U/L Final  . Total Protein 12/16/2018 8.1  6.0 - 8.3 g/dL Final  . Albumin 12/16/2018 4.4  3.5 - 5.2 g/dL Final  . Calcium 12/16/2018 10.1  8.4 - 10.5 mg/dL Final  . GFR 12/16/2018 59.24* >60.00 mL/min Final    Allergies as of 12/21/2018   No Known Allergies     Medication List       Accurate as of December 21, 2018 12:01 PM. Always use your most recent med list.        accu-chek soft touch lancets Use one new lancet three times per day. Do not reuse lancets.   atorvastatin 20 MG tablet Commonly known as:  LIPITOR TAKE 1 TABLET BY MOUTH EVERY DAY   BASAGLAR KWIKPEN 100 UNIT/ML Sopn Inject 0.6 mLs (60 Units total) into the skin at bedtime.   FREESTYLE LIBRE 14 DAY SENSOR Misc 1 Units by Does not apply route every 14 (fourteen) days.   gabapentin 300 MG capsule Commonly known as:  NEURONTIN Take 1 capsule (300 mg total) by mouth at bedtime as needed.   glucose blood test strip 1 each by Other route as needed for other. Use as instructed to check blood sugar three times daily..   insulin aspart 100 UNIT/ML FlexPen Commonly known as:  NOVOLOG FLEXPEN 20 units before each meal   Insulin Pen Needle 31G X 5 MM Misc Use for insulin 4 times a day   Insulin Syringes (Disposable) U-100 0.3 ML Misc Inject 1 each into the skin 3 (three) times daily.   lisinopril-hydrochlorothiazide 20-25 MG tablet Commonly  known as:  PRINZIDE,ZESTORETIC Take 2 tablets by mouth daily.  metFORMIN 500 MG 24 hr tablet Commonly known as:  GLUCOPHAGE-XR Take 2 tablets (1,000 mg total) by mouth daily with supper.   Semaglutide(0.25 or 0.5MG /DOS) 2 MG/1.5ML Sopn Commonly known as:  OZEMPIC (0.25 OR 0.5 MG/DOSE) Inject 0.5 mg into the skin once a week.       Allergies: No Known Allergies  Past Medical History:  Diagnosis Date  . Charcot foot due to diabetes mellitus ([place])   . Constipation    sometimes soft, some hard -- does go daily - are on fiber   . Diabetes mellitus   . GERD (gastroesophageal reflux disease)   . Hyperlipidemia   . Hypertension   . Neuromuscular disorder ([place])    neuropathy    Past Surgical History:  Procedure Laterality Date  . UPPER GASTROINTESTINAL ENDOSCOPY  2002   Dr Collene Mares     Family History  Problem Relation Age of Onset  . Diabetes Mellitus II Mother   . CAD Mother   . Prostate cancer Father   . Prostate cancer Maternal Uncle   . Prostate cancer Maternal Uncle   . Colon cancer Neg Hx   . Colon polyps Neg Hx     Social History:  reports that he has never smoked. He has never used smokeless tobacco. He reports that he does not drink alcohol or use drugs.   Review of Systems  Lipid history: Has been treated by PCP with 20 mg atorvastatin    Lab Results  Component Value Date   CHOL 214 (H) 12/01/2017   HDL 33 (L) 12/01/2017   LDLCALC 131 (H) 12/01/2017   TRIG 250 (H) 12/01/2017   CHOLHDL 6.5 (H) 12/01/2017           Hypertension: Has been treated with Zestoretic  BP Readings from Last 3 Encounters:  12/21/18 122/72  11/11/18 132/88  07/10/18 (!) 132/91    Most recent eye exam was in 2/19  Most recent foot exam: 11/19  Currently known complications of diabetes: Neuropathy, erectile dysfunction, unknown status of retinopathy  Has a renal dysfunction of unclear etiology  Lab Results  Component Value Date   CREATININE 1.59 (H) 12/16/2018    CREATININE 1.69 (H) 11/11/2018   CREATININE 1.68 (H) 06/24/2018     LABS:  Lab on 12/16/2018  Component Date Value Ref Range Status  . Fructosamine 12/16/2018 333* 0 - 285 umol/L Final   Comment: Published reference interval for apparently healthy subjects between age 61 and 68 is 54 - 285 umol/L and in a poorly controlled diabetic population is 228 - 563 umol/L with a mean of 396 umol/L.   Marland Kitchen Sodium 12/16/2018 142  135 - 145 mEq/L Final  . Potassium 12/16/2018 3.5  3.5 - 5.1 mEq/L Final  . Chloride 12/16/2018 101  96 - 112 mEq/L Final  . CO2 12/16/2018 34* 19 - 32 mEq/L Final  . Glucose, Bld 12/16/2018 77  70 - 99 mg/dL Final  . BUN 12/16/2018 18  6 - 23 mg/dL Final  . Creatinine, Ser 12/16/2018 1.59* 0.40 - 1.50 mg/dL Final  . Total Bilirubin 12/16/2018 0.5  0.2 - 1.2 mg/dL Final  . Alkaline Phosphatase 12/16/2018 60  39 - 117 U/L Final  . AST 12/16/2018 15  0 - 37 U/L Final  . ALT 12/16/2018 15  0 - 53 U/L Final  . Total Protein 12/16/2018 8.1  6.0 - 8.3 g/dL Final  . Albumin 12/16/2018 4.4  3.5 - 5.2 g/dL Final  . Calcium 12/16/2018 10.1  8.4 - 10.5 mg/dL Final  . GFR 12/16/2018 59.24* >60.00 mL/min Final    Physical Examination:  BP 122/72 (BP Location: Left Arm, Patient Position: Sitting, Cuff Size: Normal)   Pulse 100   Ht 6\' 4"  (1.93 m)   Wt 289 lb 6.4 oz (131.3 kg)   SpO2 97%   BMI 35.23 kg/m     ASSESSMENT:  Diabetes type 2 with BMI 35  See history of present illness for detailed discussion of current diabetes management, blood sugar patterns and problems identified  His blood sugars are overall significantly better with adding Ozempic and mealtime insulin  However his fasting readings are significantly high because of his not taking enough basal insulin Also not checking enough readings after meals especially dinner He may be getting a little hypoglycemic occasionally with smaller meals in the evenings but is not checking readings when he is  symptomatic Also can do better with exercise which he is not doing He can benefit from nutritional counseling but cannot afford this, had been previously advised to improve his diet with cutting back on simple sugars and fat  His portion control is better with Ozempic which she is tolerating  HYPERTENSION: Well controlled  Lipids: Needs follow-up evaluation, has been followed by PCP, needs LDL at least under 100  Renal dysfunction: Improved but he will need to follow-up with PCP, does not appear to have diabetic nephropathy  PLAN:    1. Glucose monitoring: He will need to check readings after meals consistently even if he does not check more than once or twice a day He thinks he will have coverage for Verio meter and prescription sent for the test strips   2.  Recommended exercise for weight loss with regular walking  3.  Medication changes needed: Start metformin back and for better tolerability will use metformin ER 1000 mg daily at dinnertime, may need to increase the dose but may be limited by renal function If his morning sugars do not improve he will go up to 60 units on Basaglar.  Discussed blood sugar targets He should not take more than 25 units of NovoLog, based on his meal size and pre-meal blood sugar, generally should take 20 units  Follow-up in 2 months    Counseling time on subjects discussed in assessment and plan sections is over 50% of today's 25 minute visit   Patient Instructions  Novolog 20-25 at meals  Metformin 2 at supper   Stay on [place]: if am suars stay >150, go back to 60 Basaglar  Check blood sugars on waking up  5 days a week  Also check blood sugars about 2 hours after meals and do this after different meals by rotation  Recommended blood sugar levels on waking up are 90-130 and about 2 hours after meal is 130-160  Please bring your blood sugar monitor to each visit, thank you       Elayne Snare 12/21/2018, 12:01 PM    Note: This office note was prepared with Dragon voice recognition system technology. Any transcriptional errors that result from this process are unintentional.

## 2019-02-19 ENCOUNTER — Other Ambulatory Visit (INDEPENDENT_AMBULATORY_CARE_PROVIDER_SITE_OTHER): Payer: BC Managed Care – PPO

## 2019-02-19 DIAGNOSIS — E7800 Pure hypercholesterolemia, unspecified: Secondary | ICD-10-CM | POA: Diagnosis not present

## 2019-02-19 DIAGNOSIS — Z794 Long term (current) use of insulin: Secondary | ICD-10-CM

## 2019-02-19 DIAGNOSIS — E1165 Type 2 diabetes mellitus with hyperglycemia: Secondary | ICD-10-CM | POA: Diagnosis not present

## 2019-02-19 LAB — LIPID PANEL
CHOLESTEROL: 89 mg/dL (ref 0–200)
HDL: 29.3 mg/dL — ABNORMAL LOW (ref >39.00)
LDL Cholesterol: 42 mg/dL (ref 0–99)
NONHDL: 59.85
TRIGLYCERIDES: 87 mg/dL (ref 0.0–149.0)
Total CHOL/HDL Ratio: 3
VLDL: 17.4 mg/dL (ref 0.0–40.0)

## 2019-02-19 LAB — COMPREHENSIVE METABOLIC PANEL
ALBUMIN: 4.5 g/dL (ref 3.5–5.2)
ALK PHOS: 69 U/L (ref 39–117)
ALT: 15 U/L (ref 0–53)
AST: 15 U/L (ref 0–37)
BUN: 19 mg/dL (ref 6–23)
CALCIUM: 10.1 mg/dL (ref 8.4–10.5)
CO2: 31 mEq/L (ref 19–32)
Chloride: 104 mEq/L (ref 96–112)
Creatinine, Ser: 1.49 mg/dL (ref 0.40–1.50)
GFR: 60.03 mL/min (ref >60.00)
GLUCOSE: 130 mg/dL — AB (ref 70–99)
Potassium: 4.7 mEq/L (ref 3.5–5.1)
Sodium: 142 mEq/L (ref 135–145)
TOTAL PROTEIN: 7.8 g/dL (ref 6.0–8.3)
Total Bilirubin: 0.5 mg/dL (ref 0.2–1.2)

## 2019-02-19 LAB — HEMOGLOBIN A1C: Hgb A1c MFr Bld: 8.4 % — ABNORMAL HIGH (ref 4.6–6.5)

## 2019-02-24 ENCOUNTER — Ambulatory Visit: Payer: BC Managed Care – PPO | Admitting: Endocrinology

## 2019-02-25 ENCOUNTER — Ambulatory Visit: Payer: BC Managed Care – PPO | Admitting: Internal Medicine

## 2019-03-01 ENCOUNTER — Encounter: Payer: Self-pay | Admitting: Internal Medicine

## 2019-03-01 ENCOUNTER — Other Ambulatory Visit: Payer: Self-pay

## 2019-03-01 ENCOUNTER — Ambulatory Visit: Payer: BC Managed Care – PPO | Admitting: Internal Medicine

## 2019-03-01 VITALS — BP 138/98 | HR 105 | Ht 69.0 in | Wt 291.0 lb

## 2019-03-01 DIAGNOSIS — Z794 Long term (current) use of insulin: Secondary | ICD-10-CM

## 2019-03-01 DIAGNOSIS — E1122 Type 2 diabetes mellitus with diabetic chronic kidney disease: Secondary | ICD-10-CM | POA: Diagnosis not present

## 2019-03-01 DIAGNOSIS — E1165 Type 2 diabetes mellitus with hyperglycemia: Secondary | ICD-10-CM

## 2019-03-01 DIAGNOSIS — N182 Chronic kidney disease, stage 2 (mild): Secondary | ICD-10-CM

## 2019-03-01 DIAGNOSIS — E1142 Type 2 diabetes mellitus with diabetic polyneuropathy: Secondary | ICD-10-CM | POA: Diagnosis not present

## 2019-03-01 MED ORDER — SEMAGLUTIDE(0.25 OR 0.5MG/DOS) 2 MG/1.5ML ~~LOC~~ SOPN: 0.2500 mg | PEN_INJECTOR | SUBCUTANEOUS | 6 refills | Status: DC

## 2019-03-01 MED ORDER — INSULIN ASPART 100 UNIT/ML FLEXPEN: 24.0000 [IU] | PEN_INJECTOR | Freq: Every day | SUBCUTANEOUS | 1 refills | Status: DC

## 2019-03-01 NOTE — Progress Notes (Signed)
Name: Raymond Ryan  Age/ Sex: 53 y.o., male   MRN/ DOB: [id number], [birth year]     PCP: Tawny Asal   Reason for Endocrinology Evaluation: Type 2 Diabetes Mellitus  Initial Endocrine Consultative Visit: 11/11/2018    PATIENT IDENTIFIER: [name] is a 53 y.o. male with a past medical history of T2DM, HTN and dyslipidemia. The patient has followed with Endocrinology clinic since 11/11/2018 for consultative assistance with management of his diabetes.  DIABETIC HISTORY:  [name] was diagnosed with T2DM in 2002, He has been treated with Metformin, glipizide, victoza   and invokana in the past with persistent hyperglycemia. Insulin was started in 2016. His hemoglobin A1c has ranged from 6.6% in 2013, peaking at 14.0% in 2016.  He is a bus Geophysicist/field seismologist.  SUBJECTIVE:   During the last visit (12/21/2018): A1c was 13.0%. He was restarted on metformin, he was continued on basaglar and novolog.   Today (03/01/2019): Raymond Ryan is here for a 3 month follow up on his diabetes management.  He checks his blood sugars 1-2 times daily, preprandial . The patient has had hypoglycemic episodes since the last clinic visit, which typically occur 1 x /,month - most often occuring during the day. The patient is symptomatic with these episodes, with symptoms of nervous feeling. Otherwise, the patient has not required any recent emergency interventions for hypoglycemia and has not had recent hospitalizations secondary to hyper or hypoglycemic episodes.    ROS: As per HPI and as detailed below: Review of Systems  Respiratory: Negative for cough and shortness of breath.   Cardiovascular: Negative for chest pain and palpitations.  Genitourinary: Negative for frequency.  Neurological: Positive for tingling.  Endo/Heme/Allergies: Negative for polydipsia.      HOME DIABETES REGIMEN:  Metformin 500 mg XR 2tabs with supper - taking 1 tablet with lunch    Basaglar 60 units QHS Novolog 30 units with meal  Ozempic - not taking for the past 2 weeks.   Statin: yes ACE-I/ARB: yes    METER DOWNLOAD SUMMARY: Date range evaluated: 2/3-02/28/2019 Fingerstick Blood Glucose Tests = 29 Average Number Tests/Day = 1 Overall Mean FS Glucose = 152 Standard Deviation = 42.7  BG Ranges: Low = 66 High = 238   Hypoglycemic Events/30 Days: BG < 50 = 0 Episodes of symptomatic severe hypoglycemia = 0   DIABETIC COMPLICATIONS: Microvascular complications:   CKD, neuropathy   Denies: retinopathy   Last Eye Exam: Completed 2019  Macrovascular complications:    Denies: CAD, CVA, PVD   HISTORY:  Past Medical History:  Past Medical History:  Diagnosis Date  . Charcot foot due to diabetes mellitus ([place])   . Constipation    sometimes soft, some hard -- does go daily - are on fiber   . Diabetes mellitus   . GERD (gastroesophageal reflux disease)   . Hyperlipidemia   . Hypertension   . Neuromuscular disorder ([place])    neuropathy   Past Surgical History:  Past Surgical History:  Procedure Laterality Date  . UPPER GASTROINTESTINAL ENDOSCOPY  2002   Dr Collene Mares     Social History:  reports that he has never smoked. He has never used smokeless tobacco. He reports that he does not drink alcohol or use drugs. Family History:  Family History  Problem Relation Age of Onset  . Diabetes Mellitus II Mother   . CAD Mother   . Prostate cancer Father   . Prostate cancer Maternal Uncle   .  Prostate cancer Maternal Uncle   . Colon cancer Neg Hx   . Colon polyps Neg Hx      HOME MEDICATIONS: Allergies as of 03/01/2019   No Known Allergies     Medication List       Accurate as of March 01, 2019 10:11 AM. Always use your most recent med list.        accu-chek soft touch lancets Use one new lancet three times per day. Do not reuse lancets.   atorvastatin 20 MG tablet Commonly known as:  LIPITOR TAKE 1 TABLET BY MOUTH EVERY DAY    BASAGLAR KWIKPEN 100 UNIT/ML Sopn Inject 0.6 mLs (60 Units total) into the skin at bedtime.   FREESTYLE LIBRE 14 DAY SENSOR Misc 1 Units by Does not apply route every 14 (fourteen) days.   gabapentin 300 MG capsule Commonly known as:  NEURONTIN Take 1 capsule (300 mg total) by mouth at bedtime as needed.   glucose blood test strip 1 each by Other route as needed for other. Use as instructed to check blood sugar three times daily..   insulin aspart 100 UNIT/ML FlexPen Commonly known as:  NOVOLOG FLEXPEN Inject 24 Units into the skin daily with breakfast. 20 units before each meal   Insulin Pen Needle 31G X 5 MM Misc Use for insulin 4 times a day   Insulin Syringes (Disposable) U-100 0.3 ML Misc Inject 1 each into the skin 3 (three) times daily.   lisinopril-hydrochlorothiazide 20-25 MG tablet Commonly known as:  PRINZIDE,ZESTORETIC Take 2 tablets by mouth daily.   metFORMIN 500 MG 24 hr tablet Commonly known as:  GLUCOPHAGE-XR Take 2 tablets (1,000 mg total) by mouth daily with supper.   Semaglutide(0.25 or 0.5MG /DOS) 2 MG/1.5ML Sopn Commonly known as:  OZEMPIC (0.25 OR 0.5 MG/DOSE) Inject 0.25 mg into the skin once a week.        OBJECTIVE:   Vital Signs: BP (!) 138/98 (BP Location: Left Arm, Patient Position: Sitting, Cuff Size: Normal)   Pulse (!) 105   Ht 5\' 9"  (1.753 m)   Wt 291 lb (132 kg)   SpO2 97%   BMI 42.97 kg/m   Wt Readings from Last 3 Encounters:  03/01/19 291 lb (132 kg)  12/21/18 289 lb 6.4 oz (131.3 kg)  11/11/18 286 lb (129.7 kg)     Exam: General: Pt appears well and is in NAD  Neck: General: Supple without adenopathy. Thyroid: Thyroid size normal.  No goiter or nodules appreciated. No thyroid bruit.  Lungs: Clear with good BS bilat with no rales, rhonchi, or wheezes  Heart: RRR with normal S1 and S2 and no gallops; no murmurs; no rub  Abdomen: Normoactive bowel sounds, soft, nontender, without masses or organomegaly palpable   Extremities: No pretibial edema.  Skin: Normal texture and temperature to palpation. No rash noted.  Neuro: MS is good with appropriate affect, pt is alert and Ox3      DATA REVIEWED:  Lab Results  Component Value Date   HGBA1C 8.4 (H) 02/19/2019   HGBA1C 13.0 (A) 11/11/2018   HGBA1C 12.0 (A) 06/24/2018   Lab Results  Component Value Date   MICROALBUR 4.2 (H) 11/11/2018   LDLCALC 42 02/19/2019   CREATININE 1.49 02/19/2019   Lab Results  Component Value Date   MICRALBCREAT 2.5 11/11/2018    Lab Results  Component Value Date   FRUCTOSAMINE 333 (H) 12/16/2018     Lab Results  Component Value Date   CHOL 89 02/19/2019  HDL 29.30 (L) 02/19/2019   LDLCALC 42 02/19/2019   TRIG 87.0 02/19/2019   CHOLHDL 3 02/19/2019       Results for Raymond Ryan, Raymond Ryan (MRN [id number]) as of 03/01/2019 10:09  Ref. Range 11/11/2018 12:15 12/16/2018 09:39 02/19/2019 09:54  Sodium Latest Ref Range: 135 - 145 mEq/L 139 142 142  Potassium Latest Ref Range: 3.5 - 5.1 mEq/L 4.1 3.5 4.7  Chloride Latest Ref Range: 96 - 112 mEq/L 102 101 104  CO2 Latest Ref Range: 19 - 32 mEq/L 29 34 (H) 31  Glucose Latest Ref Range: 70 - 99 mg/dL 133 (H) 77 130 (H)  BUN Latest Ref Range: 6 - 23 mg/dL 26 (H) 18 19  Creatinine Latest Ref Range: 0.40 - 1.50 mg/dL 1.69 (H) 1.59 (H) 1.49  Calcium Latest Ref Range: 8.4 - 10.5 mg/dL 10.0 10.1 10.1  Alkaline Phosphatase Latest Ref Range: 39 - 117 U/L 70 60 69  Albumin Latest Ref Range: 3.5 - 5.2 g/dL 4.6 4.4 4.5  AST Latest Ref Range: 0 - 37 U/L 21 15 15   ALT Latest Ref Range: 0 - 53 U/L 22 15 15   Total Protein Latest Ref Range: 6.0 - 8.3 g/dL 8.3 8.1 7.8  Total Bilirubin Latest Ref Range: 0.2 - 1.2 mg/dL 0.6 0.5 0.5  GFR Latest Ref Range: >60.00 mL/min 55.23 (L) 59.24 (L) 60.03  Total CHOL/HDL Ratio Unknown   3  Cholesterol Latest Ref Range: 0 - 200 mg/dL   89  HDL Cholesterol Latest Ref Range: >39.00 mg/dL   29.30 (L)  LDL (calc) Latest Ref Range: 0 - 99 mg/dL    42  MICROALB/CREAT RATIO Latest Ref Range: 0.0 - 30.0 mg/g 2.5    NonHDL Unknown   59.85  Triglycerides Latest Ref Range: 0.0 - 149.0 mg/dL   87.0  VLDL Latest Ref Range: 0.0 - 40.0 mg/dL   17.4  Fructosamine Latest Ref Range: 0 - 285 umol/L  333 (H)   Hemoglobin A1C Latest Ref Range: 4.6 - 6.5 %   8.4 (H)    ASSESSMENT / PLAN / RECOMMENDATIONS:   1) Type 2 Diabetes Mellitus, poorly controlled, With CKD II/III and neuropathic complications - Most recent A1c of 8.4 %. Goal A1c < 7.0 %.    Plan:  -Praised the patient on lifestyle changes, and compliance with medications as well as glucose checks. -His A1c is 8.4%, down from 13.0% -He was noted to have one episode of hypoglycemia in the past month, this is most likely due to insulin carbohydrate mismatch.  The patient takes NovoLog with breakfast only, he does not take it with supper as prescribed but in review of his glucose he is not having severe hyperglycemia at the end of the day. -He had run out of the [place] and did not have any refills, we will restart Ozempic at 0.25 mg weekly.  Patient states he has tried the Ozempic initially on 0.25 mg for 2 weeks followed by an increase to 0.5 mg weekly, but this caused nausea and GI discomfort. -I have advised him to continue on Ozempic 0.25 mg weekly for at least 6 weeks before titrating the dose up. -We will also titrate his metformin up as below. -He declines to see a CDE, due to concerns about the cost, I have briefly discussed with him the importance of knowing what his carbohydrates are, to prevent insulin carbohydrate mismatch, which could lead to hypoglycemia or hyper glycemia. -Patient encouraged to call with BG is below 70 mg/dL to  adjust his insulin regimen. -DMV  form was filled today (endocrinology section)   MEDICATIONS:  Increase metformin 500 mg XR to 2 tablets with lunch  We will start Ozempic at 0.25 mg weekly  Decrease NovoLog to 24 units with breakfast  Continue  Basaglar 60 units nightly  EDUCATION / INSTRUCTIONS:  BG monitoring instructions: Patient is instructed to check his blood sugars 3 times a day, before meals and bedtime.  Patient states he only eats 2 meals a day.  Call [facility] if: BG persistently < 70 or > 300. . I reviewed the Rule of 15 for the treatment of hypoglycemia in detail with the patient. Literature supplied.    2) Diabetic complications:   Eye: Does not have known diabetic retinopathy.   Neuro/ Feet: Does have known diabetic peripheral neuropathy .   Renal: Patient does  have known baseline CKD, fluctuates between II and III. He   is on an ACEI/ARB at present.   3) Lipids: Patient is  on a statin.  4) Hypertension: He is abovegoal of < 140/90 mmHg.    F/U in 3 months   Signed electronically by: Mack Guise, MD  [facility]  [facility] [address] [place], [place] [address] Phone: [phone] FAX: [phone]   CC: Tawny Asal No address on file Phone: None  Fax: None  Return to Endocrinology clinic as below: Future Appointments  Date Time Provider [place]  06/01/2019  9:45 AM Elayne Snare, MD [id number] None

## 2019-03-01 NOTE — Patient Instructions (Signed)
-   Increase Metformin to two tablets with Lunch  - Restart Ozempic at 0.25 mg weekly  - Decrease Novolog to 24 units with Breakfast  - Continue Basaglar at 60 units at bedtime     HOW TO TREAT LOW BLOOD SUGARS (Blood sugar LESS THAN 70 MG/DL)  Please follow the RULE OF 15 for the treatment of hypoglycemia treatment (when your (blood sugars are less than 70 mg/dL)    STEP 1: Take 15 grams of carbohydrates when your blood sugar is low, which includes:   3-4 GLUCOSE TABS  OR  3-4 OZ OF JUICE OR REGULAR SODA OR  ONE TUBE OF GLUCOSE GEL     STEP 2: RECHECK blood sugar in 15 MINUTES STEP 3: If your blood sugar is still low at the 15 minute recheck --> then, go back to STEP 1 and treat AGAIN with another 15 grams of carbohydrates.

## 2019-03-02 ENCOUNTER — Other Ambulatory Visit (INDEPENDENT_AMBULATORY_CARE_PROVIDER_SITE_OTHER): Payer: Self-pay | Admitting: Endocrinology

## 2019-03-03 ENCOUNTER — Other Ambulatory Visit: Payer: Self-pay

## 2019-03-03 ENCOUNTER — Telehealth: Payer: Self-pay | Admitting: Endocrinology

## 2019-03-03 MED ORDER — GLUCOSE BLOOD VI STRP: 1.0000 | ORAL_STRIP | 3 refills | Status: DC | PRN

## 2019-03-03 NOTE — Telephone Encounter (Signed)
Rx sent 

## 2019-03-03 NOTE — Telephone Encounter (Signed)
MEDICATION: One Touch Test Strips lisinopril-hydrochlorothiazide (PRINZIDE,ZESTORETIC) 20-25 MG tablet  PHARMACY:  [id number] - [place], [place] - [address]  IS THIS A 90 DAY SUPPLY :   IS PATIENT OUT OF MEDICATION:   IF NOT; HOW MUCH IS LEFT:   LAST APPOINTMENT DATE: @3 /01/2019  NEXT APPOINTMENT DATE:@6 /01/2019  DO WE HAVE YOUR PERMISSION TO LEAVE A DETAILED MESSAGE:  OTHER COMMENTS:  Patient states Dr. Kelton Pillar was supposed to be refilling these RX's for him and did not.  **Let patient know to contact pharmacy at the end of the day to make sure medication is ready. **  ** Please notify patient to allow 48-72 hours to process**  **Encourage patient to contact the pharmacy for refills or they can request refills through [facility]**

## 2019-03-21 ENCOUNTER — Other Ambulatory Visit: Payer: Self-pay | Admitting: Endocrinology

## 2019-03-22 ENCOUNTER — Telehealth (INDEPENDENT_AMBULATORY_CARE_PROVIDER_SITE_OTHER): Payer: Self-pay | Admitting: Physician Assistant

## 2019-04-01 ENCOUNTER — Telehealth: Payer: Self-pay | Admitting: Endocrinology

## 2019-04-01 ENCOUNTER — Other Ambulatory Visit: Payer: Self-pay

## 2019-04-01 MED ORDER — LISINOPRIL-HYDROCHLOROTHIAZIDE 20-25 MG PO TABS: 2.0000 | ORAL_TABLET | Freq: Every day | ORAL | 5 refills | Status: DC

## 2019-04-01 NOTE — Telephone Encounter (Signed)
MEDICATION: Lisinopril  [facility]ACY[facility]  [facility]   IS THIS A 90 DAY SUPPLY :  unknown  IS PATIENT OUT OF MEDICATION: unk  IF NOT; HOW MUCH IS LEFT:   LAST APPOINTMENT DATE: @3 /22/2020  NEXT APPOINTMENT DATE:@6 /01/2019  DO WE HAVE YOUR PERMISSION TO LEAVE A DETAILED MESSAGE: yes  OTHER COMMENTS: per patient has request a refill on this before and  Has not yet gotten it. Please call him with any questions or concerns   **Let patient know to contact pharmacy at the end of the day to make sure medication is ready. **  ** Please notify patient to allow 48-72 hours to process**  **Encourage patient to contact the pharmacy for refills or they can request refills through [facility]**

## 2019-04-01 NOTE — Telephone Encounter (Signed)
Rx sent 

## 2019-04-12 ENCOUNTER — Other Ambulatory Visit: Payer: Self-pay

## 2019-04-12 ENCOUNTER — Telehealth: Payer: Self-pay | Admitting: Internal Medicine

## 2019-04-12 ENCOUNTER — Other Ambulatory Visit: Payer: BC Managed Care – PPO

## 2019-04-12 NOTE — Telephone Encounter (Signed)
I have called the pt and LM for him to call back to set up an appt as soon as possible

## 2019-04-12 NOTE — Telephone Encounter (Signed)
When pt was here to see Dr. Maretta Bees in lieu of Dr. Dwyane Dee not feeling well, he says he remembers the nurse faxing off the A1C results to [facility] [phone]  Pt is also asking if we can write a letter for him because he is diabetic for the state emergency leave

## 2019-04-12 NOTE — Telephone Encounter (Signed)
Please advise 

## 2019-04-12 NOTE — Telephone Encounter (Signed)
I do not understand what kind of letter he needs.  I have adjusted his A1c result. Also since his medications were adjusted he needs short-term follow-up in 3 to 4 weeks with labs

## 2019-04-13 ENCOUNTER — Ambulatory Visit: Payer: BC Managed Care – PPO | Admitting: Endocrinology

## 2019-04-22 ENCOUNTER — Other Ambulatory Visit: Payer: Self-pay | Admitting: Pharmacist

## 2019-04-22 DIAGNOSIS — Z794 Long term (current) use of insulin: Principal | ICD-10-CM

## 2019-04-22 DIAGNOSIS — N182 Chronic kidney disease, stage 2 (mild): Principal | ICD-10-CM

## 2019-04-22 DIAGNOSIS — E1122 Type 2 diabetes mellitus with diabetic chronic kidney disease: Secondary | ICD-10-CM

## 2019-04-22 MED ORDER — BASAGLAR KWIKPEN 100 UNIT/ML ~~LOC~~ SOPN: 60.0000 [IU] | PEN_INJECTOR | Freq: Every day | SUBCUTANEOUS | 0 refills | Status: DC

## 2019-05-10 ENCOUNTER — Other Ambulatory Visit: Payer: Self-pay | Admitting: Endocrinology

## 2019-05-10 ENCOUNTER — Other Ambulatory Visit (INDEPENDENT_AMBULATORY_CARE_PROVIDER_SITE_OTHER): Payer: BC Managed Care – PPO

## 2019-05-10 ENCOUNTER — Other Ambulatory Visit: Payer: Self-pay

## 2019-05-10 DIAGNOSIS — Z794 Long term (current) use of insulin: Secondary | ICD-10-CM

## 2019-05-10 DIAGNOSIS — E1122 Type 2 diabetes mellitus with diabetic chronic kidney disease: Secondary | ICD-10-CM

## 2019-05-10 DIAGNOSIS — N182 Chronic kidney disease, stage 2 (mild): Secondary | ICD-10-CM

## 2019-05-10 LAB — COMPREHENSIVE METABOLIC PANEL
ALT: 18 U/L (ref 0–53)
AST: 16 U/L (ref 0–37)
Albumin: 4.5 g/dL (ref 3.5–5.2)
Alkaline Phosphatase: 67 U/L (ref 39–117)
BUN: 25 mg/dL — ABNORMAL HIGH (ref 6–23)
CO2: 29 mEq/L (ref 19–32)
Calcium: 9.9 mg/dL (ref 8.4–10.5)
Chloride: 104 mEq/L (ref 96–112)
Creatinine, Ser: 1.85 mg/dL — ABNORMAL HIGH (ref 0.40–1.50)
GFR: 46.73 mL/min — ABNORMAL LOW (ref >60.00)
Glucose, Bld: 86 mg/dL (ref 70–99)
Potassium: 4.6 mEq/L (ref 3.5–5.1)
Sodium: 141 mEq/L (ref 135–145)
Total Bilirubin: 0.4 mg/dL (ref 0.2–1.2)
Total Protein: 8.2 g/dL (ref 6.0–8.3)

## 2019-05-10 LAB — HEMOGLOBIN A1C: Hgb A1c MFr Bld: 8.4 % — ABNORMAL HIGH (ref 4.6–6.5)

## 2019-05-11 ENCOUNTER — Telehealth: Payer: Self-pay

## 2019-05-11 ENCOUNTER — Encounter: Payer: Self-pay | Admitting: Endocrinology

## 2019-05-11 NOTE — Telephone Encounter (Signed)
Pt wants to know if you would provide a letter to be taken out of work.

## 2019-05-11 NOTE — Telephone Encounter (Signed)
I am printing a letter that will need to be signed

## 2019-05-20 ENCOUNTER — Other Ambulatory Visit: Payer: Self-pay | Admitting: Family Medicine

## 2019-05-20 DIAGNOSIS — E1122 Type 2 diabetes mellitus with diabetic chronic kidney disease: Secondary | ICD-10-CM

## 2019-05-20 DIAGNOSIS — N182 Chronic kidney disease, stage 2 (mild): Secondary | ICD-10-CM

## 2019-05-24 ENCOUNTER — Other Ambulatory Visit: Payer: Self-pay | Admitting: Internal Medicine

## 2019-06-01 ENCOUNTER — Encounter: Payer: Self-pay | Admitting: Endocrinology

## 2019-06-01 ENCOUNTER — Ambulatory Visit (INDEPENDENT_AMBULATORY_CARE_PROVIDER_SITE_OTHER): Payer: BC Managed Care – PPO | Admitting: Endocrinology

## 2019-06-01 ENCOUNTER — Other Ambulatory Visit: Payer: Self-pay

## 2019-06-01 VITALS — BP 136/82 | HR 108 | Ht 69.0 in | Wt 283.6 lb

## 2019-06-01 DIAGNOSIS — E7800 Pure hypercholesterolemia, unspecified: Secondary | ICD-10-CM | POA: Diagnosis not present

## 2019-06-01 DIAGNOSIS — N289 Disorder of kidney and ureter, unspecified: Secondary | ICD-10-CM

## 2019-06-01 DIAGNOSIS — Z794 Long term (current) use of insulin: Secondary | ICD-10-CM

## 2019-06-01 DIAGNOSIS — E1165 Type 2 diabetes mellitus with hyperglycemia: Secondary | ICD-10-CM | POA: Diagnosis not present

## 2019-06-01 DIAGNOSIS — I10 Essential (primary) hypertension: Secondary | ICD-10-CM | POA: Diagnosis not present

## 2019-06-01 MED ORDER — FREESTYLE LIBRE 14 DAY SENSOR MISC: 1.0000 [IU] | 4 refills | Status: DC

## 2019-06-01 MED ORDER — INSULIN DEGLUDEC 200 UNIT/ML ~~LOC~~ SOPN: 50.0000 [IU] | PEN_INJECTOR | Freq: Every day | SUBCUTANEOUS | 1 refills | Status: DC

## 2019-06-01 MED ORDER — FREESTYLE LIBRE 14 DAY READER DEVI: 1.0000 | Freq: Once | 0 refills | Status: AC

## 2019-06-01 MED ORDER — AMLODIPINE BESYLATE 5 MG PO TABS: 5.0000 mg | ORAL_TABLET | Freq: Every day | ORAL | 3 refills | Status: DC

## 2019-06-01 NOTE — Patient Instructions (Addendum)
Check blood sugars on waking up 3-4 days a week  Also check blood sugars about 2 hours after meals and do this after different meals by rotation  Recommended blood sugar levels on waking up are 90-130 and about 2 hours after meal is 130-160  Please bring your blood sugar monitor to each visit, thank you  Adjust Novolog based on meal size and Carbs  Walk daily  Tresiba replaces Basaglar same dose  BP med take 1 daily  F/u with Margarita Rana

## 2019-06-01 NOTE — Progress Notes (Signed)
Patient ID: Raymond Ryan, male   DOB: October 23, 1966, 53 y.o.   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 .  NovoLog 20 units mostly before meals 2-3 times a day   Non-insulin hypoglycemic drugs the patient is taking are: Metformin 500 g daily.  Ozempic 0.5 mg weekly  Current management, blood sugar patterns and problems identified:  He is taking less NovoLog since his last visit  Also not clear if he is adjusting the dose based on what he is eating  Occasionally may not take any NovoLog if eating a small meal but some of his meals may be relatively higher in fat also  He still has not seen the dietitian partly because of cost  Most of his blood sugar monitoring is before meals and somewhat erratic as he feels that finger sticking is painful  With current diet he is trying to cut back on sweet drinks more often  FASTING blood sugars are variable but overall better than when he was last seen in December  Blood sugars at lunch and dinnertime are variable with occasional higher readings after lunch  He has not been motivated to exercise on his own although does appear to have lost some  weight        Side effects from medications have been: Diarrhea from regular metformin     Typical meal intake: Breakfast is oatmeal or cereal or sandwich, lunch usually fast food, dinner salad and sandwich               Exercise: None    Glucose monitoring:  done 1-2 times a day         Glucometer:  Freestyle neo.       Blood Glucose readings by time of day as above   PRE-MEAL Fasting Lunch Dinner  overnight Overall  Glucose range:  135-173   96-181  111-146   Mean/median:  152  129  130     POST-MEAL PC Breakfast PC Lunch PC Dinner  Glucose range:   107-214 ?  Mean/median:   136     Previously  PRE-MEAL Fasting Lunch Dinner Bedtime Overall  Glucose range:  147-267  81, 191  95-201    Mean/median:  200   155   172   POST-MEAL PC Breakfast PC Lunch PC Dinner  Glucose range:   ?  Mean/median:        Dietician visit, most recent: Never  Weight history:  Wt Readings from Last 3 Encounters:  06/01/19 283 lb 9.6  oz (128.6 kg)  03/01/19 291 lb (132 kg)  12/21/18 289 lb 6.4 oz (131.3 kg)    Glycemic control:   Lab Results  Component Value Date   HGBA1C 8.4 (H) 05/10/2019   HGBA1C 8.4 (H) 02/19/2019   HGBA1C 13.0 (A) 11/11/2018   Lab Results  Component Value Date   MICROALBUR 4.2 (H) 11/11/2018   LDLCALC 42 02/19/2019   CREATININE 1.85 (H) 05/10/2019   Lab Results  Component Value Date   MICRALBCREAT 2.5 11/11/2018    Lab Results  Component Value Date   FRUCTOSAMINE 333 (H) 12/16/2018    No visits with results within 1 Week(s) from this visit.  Latest known visit with results is:  Lab on 05/10/2019  Component Date Value Ref Range Status  . Sodium 05/10/2019 141  135 - 145 mEq/L Final  . Potassium 05/10/2019 4.6  3.5 - 5.1 mEq/L Final  . Chloride 05/10/2019 104  96 - 112 mEq/L Final  . CO2 05/10/2019 29  19 - 32 mEq/L Final  . Glucose, Bld 05/10/2019 86  70 - 99 mg/dL Final  . BUN 05/10/2019 25* 6 - 23 mg/dL Final  . Creatinine, Ser 05/10/2019  1.85* 0.40 - 1.50 mg/dL Final  . Total Bilirubin 05/10/2019 0.4  0.2 - 1.2 mg/dL Final  . Alkaline Phosphatase 05/10/2019 67  39 - 117 U/L Final  . AST 05/10/2019 16  0 - 37 U/L Final  . ALT 05/10/2019 18  0 - 53 U/L Final  . Total Protein 05/10/2019 8.2  6.0 - 8.3 g/dL Final  . Albumin 05/10/2019 4.5  3.5 - 5.2 g/dL Final  . Calcium 05/10/2019 9.9  8.4 - 10.5 mg/dL Final  . GFR 05/10/2019 46.73* >60.00 mL/min Final  . Hgb A1c MFr Bld 05/10/2019 8.4* 4.6 - 6.5 % Final   Glycemic Control Guidelines for People with Diabetes:Non Diabetic:  <6%Goal of Therapy: <7%Additional Action Suggested:  >8%     Allergies as of 06/01/2019   No Known Allergies     Medication List       Accurate as of June 01, 2019 12:10 PM. If you have any questions, ask your nurse or doctor.        STOP taking these medications   accu-chek soft touch lancets Stopped by:  Elayne Snare, MD   atorvastatin 20 MG tablet Commonly known as:  LIPITOR Stopped by:  Elayne Snare, MD   FreeStyle Libre 14 Day Sensor Misc Stopped by:  Elayne Snare, MD     TAKE these medications   amLODipine 5 MG tablet Commonly known as:  NORVASC Take 1 tablet (5 mg total) by mouth daily. Started by:  Elayne Snare, MD   Basaglar KwikPen 100 UNIT/ML Sopn INJECT 60 UNITS INTO THE SKIN AT BEDTIME. NEEDS APPT FOR MORE REFILLS.   gabapentin 300 MG capsule Commonly known as:  NEURONTIN Take 1 capsule (300 mg total) by mouth at bedtime as needed.   glucose blood test strip 1 each by Other route as needed for other. Use as instructed to check blood sugar three times daily..   insulin aspart 100 UNIT/ML FlexPen Commonly known as:  NovoLOG FlexPen INJECT 24 UNITS UNDER THE SKIN ONCE DAILY AT BREAKFAST.   Insulin Pen Needle 31G X 5 MM Misc Use for insulin 4 times a day   Insulin Syringes (Disposable) U-100 0.3 ML Misc Inject 1 each into the skin 3 (three) times daily.   lisinopril-hydrochlorothiazide 20-25 MG tablet Commonly known as:   ZESTORETIC Take  2 tablets by mouth daily.   metFORMIN 500 MG 24 hr tablet Commonly known as:  GLUCOPHAGE-XR Take 2 tablets (1,000 mg total) by mouth daily with supper.   OneTouch Verio Flex System w/Device Kit by Does not apply route.   Semaglutide(0.25 or 0.'5MG'$ /DOS) 2 MG/1.5ML Sopn Commonly known as:  Ozempic (0.25 or 0.5 MG/DOSE) Inject 0.25 mg into the skin once a week.       Allergies: No Known Allergies  Past Medical History:  Diagnosis Date  . Charcot foot due to diabetes mellitus ([place])   . Constipation    sometimes soft, some hard -- does go daily - are on fiber   . Diabetes mellitus   . GERD (gastroesophageal reflux disease)   . Hyperlipidemia   . Hypertension   . Neuromuscular disorder ([place])    neuropathy    Past Surgical History:  Procedure Laterality Date  . UPPER GASTROINTESTINAL ENDOSCOPY  2002   Dr Collene Mares     Family History  Problem Relation Age of Onset  . Diabetes Mellitus II Mother   . CAD Mother   . Prostate cancer Father   . Prostate cancer Maternal Uncle   . Prostate cancer Maternal Uncle   . Colon cancer Neg Hx   . Colon polyps Neg Hx     Social History:  reports that he has never smoked. He has never used smokeless tobacco. He reports that he does not drink alcohol or use drugs.   Review of Systems  Lipid history: Has been treated by PCP with 20 mg atorvastatin with adequate control previously Not clear why he is not taking any medications currently    Lab Results  Component Value Date   CHOL 89 02/19/2019   HDL 29.30 (L) 02/19/2019   LDLCALC 42 02/19/2019   TRIG 87.0 02/19/2019   CHOLHDL 3 02/19/2019           Hypertension: Has been treated with Zestoretic, on his last visit with another physician the dose was increased to 2 tablets daily  BP Readings from Last 3 Encounters:  06/01/19 136/82  03/01/19 (!) 138/98  12/21/18 122/72    Most recent eye exam was in 2/19  Most recent foot exam: 11/19  He takes gabapentin at  night from his PCP for neuropathic pain  Currently known complications of diabetes: Neuropathy, erectile dysfunction, unknown status of retinopathy  Has a renal dysfunction of unclear etiology and this appears to be worse now  Lab Results  Component Value Date   CREATININE 1.85 (H) 05/10/2019   CREATININE 1.49 02/19/2019   CREATININE 1.59 (H) 12/16/2018     LABS:  No visits with results within 1 Week(s) from this visit.  Latest known visit with results is:  Lab on 05/10/2019  Component Date Value Ref Range Status  . Sodium 05/10/2019 141  135 - 145 mEq/L Final  . Potassium 05/10/2019 4.6  3.5 - 5.1 mEq/L Final  . Chloride 05/10/2019 104  96 - 112 mEq/L Final  . CO2 05/10/2019 29  19 - 32 mEq/L Final  . Glucose, Bld 05/10/2019 86  70 - 99 mg/dL Final  . BUN 05/10/2019 25* 6 - 23 mg/dL Final  . Creatinine, Ser 05/10/2019 1.85* 0.40 - 1.50 mg/dL Final  . Total Bilirubin 05/10/2019 0.4  0.2 - 1.2 mg/dL Final  . Alkaline Phosphatase 05/10/2019 67  39 - 117 U/L Final  . AST 05/10/2019 16  0 - 37 U/L Final  . ALT 05/10/2019 18  0 -  53 U/L Final  . Total Protein 05/10/2019 8.2  6.0 - 8.3 g/dL Final  . Albumin 05/10/2019 4.5  3.5 - 5.2 g/dL Final  . Calcium 05/10/2019 9.9  8.4 - 10.5 mg/dL Final  . GFR 05/10/2019 46.73* >60.00 mL/min Final  . Hgb A1c MFr Bld 05/10/2019 8.4* 4.6 - 6.5 % Final   Glycemic Control Guidelines for People with Diabetes:Non Diabetic:  <6%Goal of Therapy: <7%Additional Action Suggested:  >8%     Physical Examination:  BP 136/82 (BP Location: Left Arm, Patient Position: Sitting, Cuff Size: Normal)   Pulse (!) 108   Ht '5\' 9"'$  (1.753 m)   Wt 283 lb 9.6 oz (128.6 kg)   SpO2 98%   BMI 41.88 kg/m     ASSESSMENT:  Diabetes type 2 with BMI 42  See history of present illness for detailed discussion of current diabetes management, blood sugar patterns and problems identified  His A1c is still high at 8.4  Although his fasting blood sugars are high they  are improved and continue to improve with using Ozempic He also is likely doing better with his diet with some weight loss Currently also taking less NovoLog at mealtimes, previously taking up to 30 units  However his blood sugars are fluctuating during the day and he is not adequately covering his lunch meals adequately or skipping the NovoLog He is tending to have higher readings if he is eating higher fat meal also even with less carbohydrate Currently not checking readings after dinner despite reminders  He probably does need a higher dose of basal insulin but may also benefit from a more consistent 24-hour effect with [place] instead of Basaglar Also not exercising which will help his control    HYPERTENSION: Better controlled with higher doses of Zestoretic  Renal dysfunction: 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 Glucose monitoring: He will need to check blood sugars after dinner more often and rotate when he checks his sugars He is interested in the freestyle [place] and discussed that he can try to get this again with or without insurance, given him the name of [facility] as a discount provider He will ask for any assistance with starting this  2.  Recommended brisk walking daily  3.  Medication changes needed: He will need to switch to [place] as discussed above and for now will start with the same dose of 50 units He will probably need at least 8 to 10 units coverage for small meals and may take to 25 units for larger carbohydrate meals Discussed  needing to adjust the NovoLog based on what his blood sugars register after meals He will also try not to skip the dose and make sure he has his insulin with him when eating out We will go back to 1 tablet daily of lisinopril HCT instead of 2 tablets and start taking amlodipine Given list of PCP providers who are taking new patients in the practice Continue Ozempic but may consider increasing the dose to 1 mg  He probably  will benefit from nutritional counseling but currently he cannot afford this  Follow-up in 2 months  Total visit time for evaluation and management of multiple problems and counseling =25 minutes   Patient Instructions  Check blood sugars on waking up 3-4 days a week  Also check blood sugars about 2 hours after meals and do this after different meals by rotation  Recommended blood sugar levels on waking up are 90-130 and about 2 hours after meal is 130-160  Please bring your blood sugar monitor to each visit, thank you  Adjust Novolog based on meal size and Carbs  Walk daily  Tyler Aas replaces Basaglar same dose  BP med take 1 daily  F/u with Doroteo Bradford 06/01/2019, 12:10 PM   Note: This office note was prepared with Dragon voice recognition system technology. Any transcriptional errors that result from this process are unintentional.

## 2019-06-09 ENCOUNTER — Telehealth: Payer: Self-pay | Admitting: General Practice

## 2019-06-09 NOTE — Telephone Encounter (Signed)
Ok with me 

## 2019-06-09 NOTE — Telephone Encounter (Signed)
Copied from Micro [id number]. Topic: Appointment Scheduling - Scheduling Inquiry for Clinic >> Jun 09, 2019 10:13 AM Richardo Priest, NT wrote: Reason for CRM: Patient is calling in requesting to be a new patient. Please advise, call back is [phone].

## 2019-06-15 NOTE — Telephone Encounter (Signed)
Scheduled

## 2019-06-21 IMAGING — CT CT CHEST W/O CM
2 of 3 series · 15 of 36 positions shown, 18 images · non-contrast
Comparison: None.

CLINICAL DATA: 52 y/o  M; lung nodule.

EXAM:
CT CHEST WITHOUT CONTRAST
TECHNIQUE: Multidetector CT imaging of the chest was performed following the
standard protocol without IV contrast.

[Series 2: thorax · axial · 0.78mm/px · z∈[-323,-57]mm · 12 of 157 slices shown, 15 images]
[im 12/157  mediastinal]
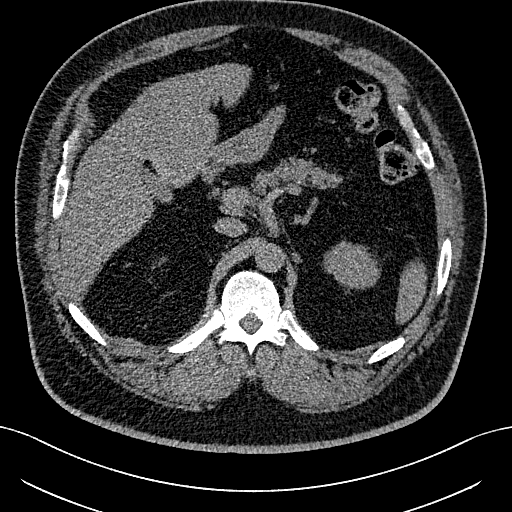
[im 12/157  lung]
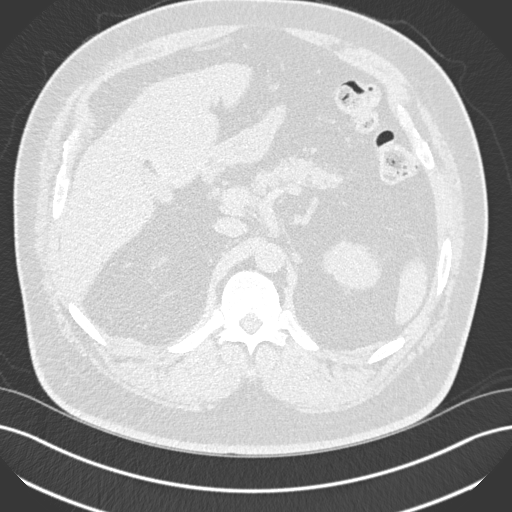
[im 24/157  lung]
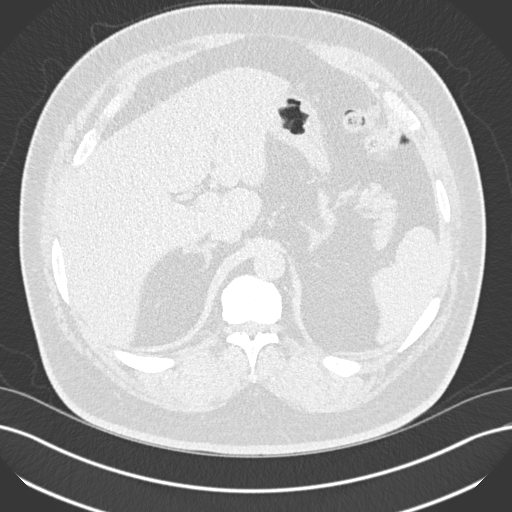
[im 35/157  lung]
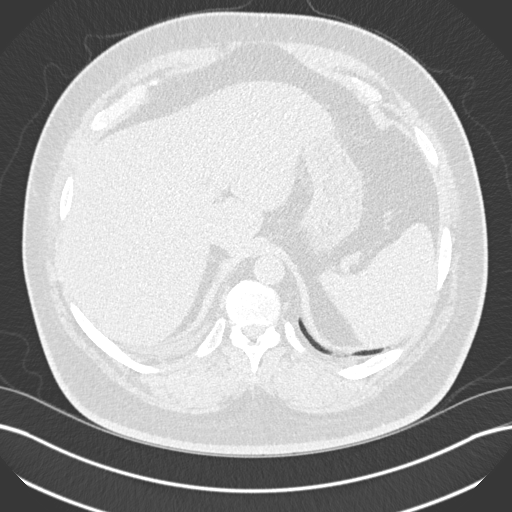
[im 47/157  lung]
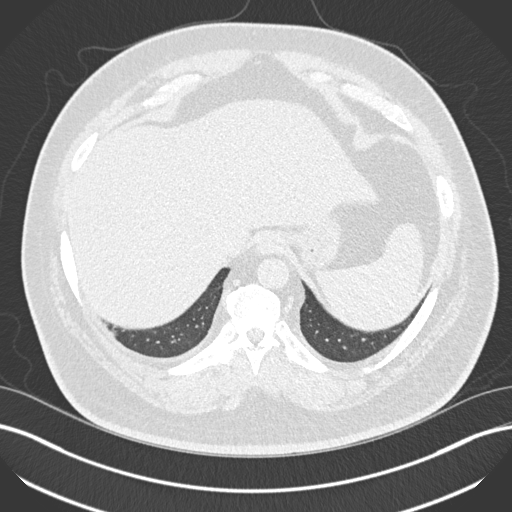
[im 58/157  mediastinal]
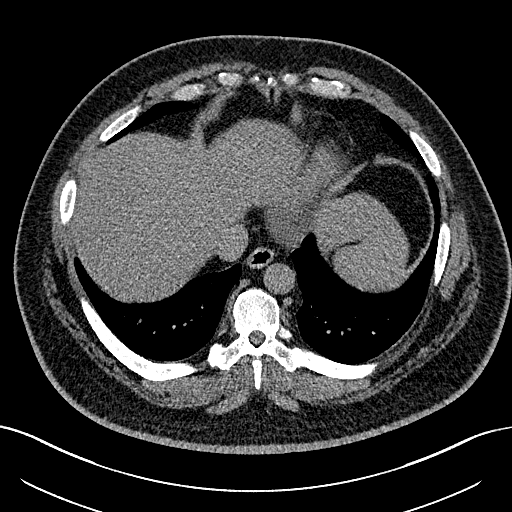
[im 58/157  lung]
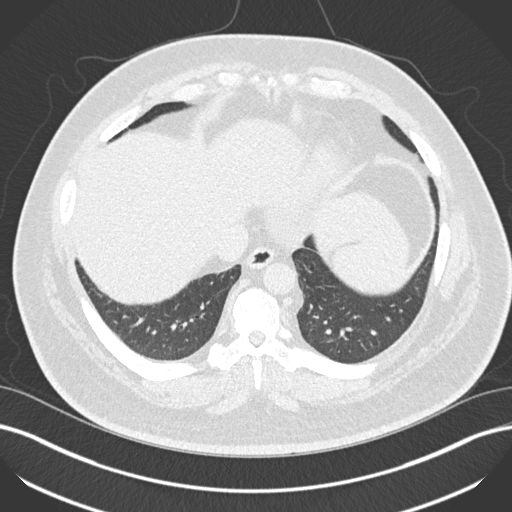
[im 70/157  lung]
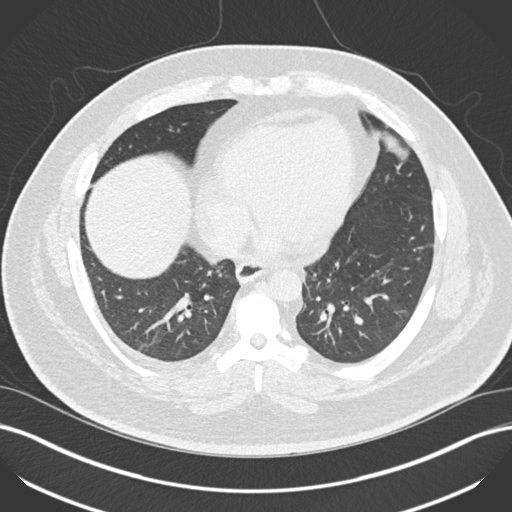
[im 87/157  lung]
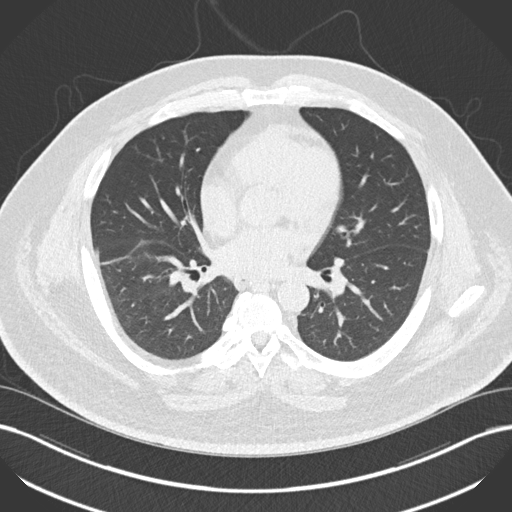
[im 99/157  lung]
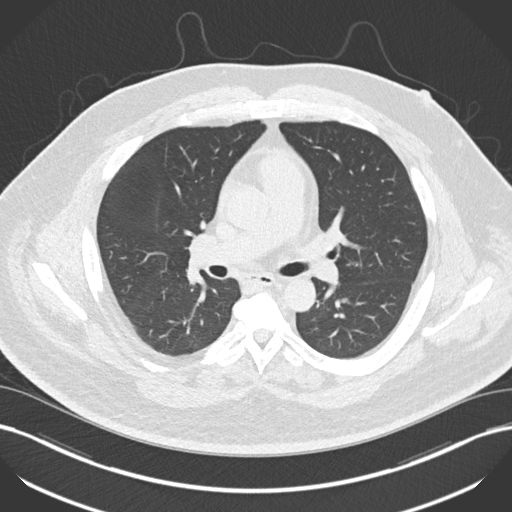
[im 110/157  mediastinal]
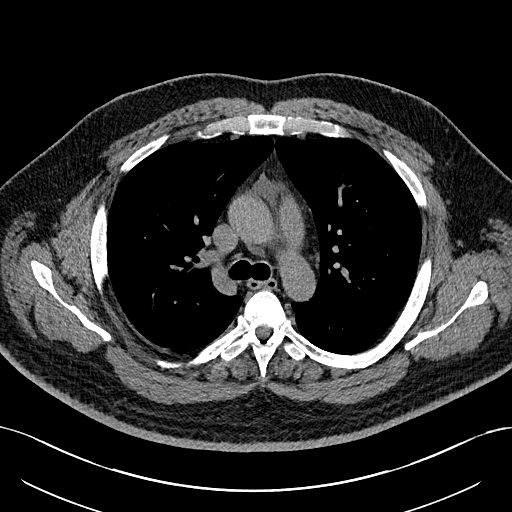
[im 110/157  lung]
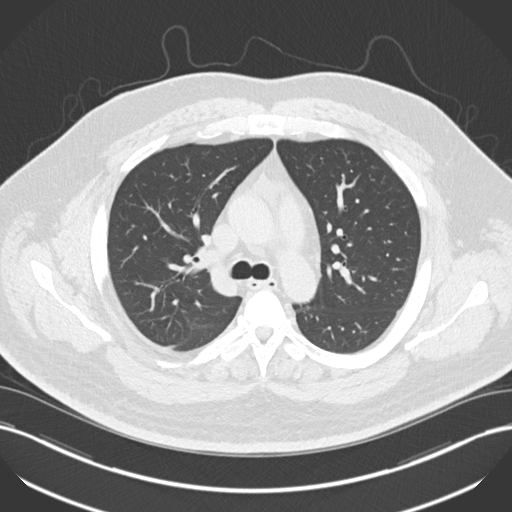
[im 122/157  lung]
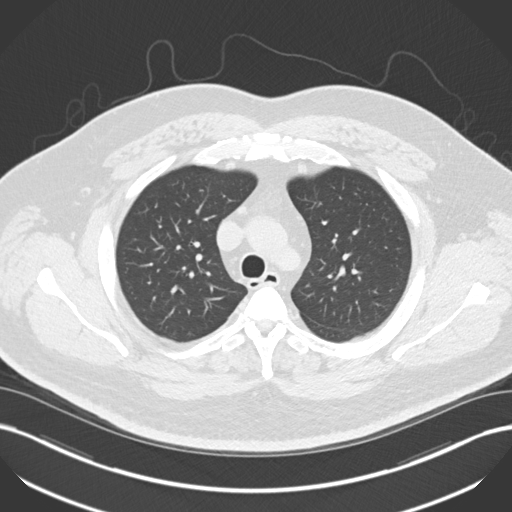
[im 133/157  lung]
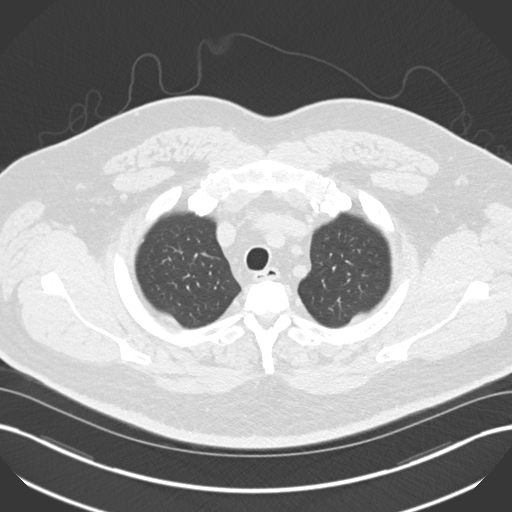
[im 145/157  lung]
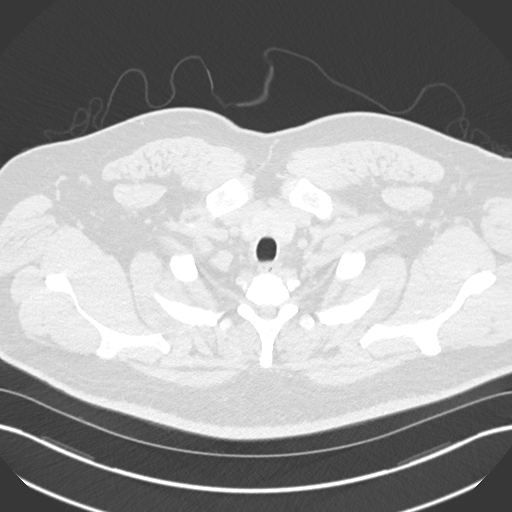

[Series 6: coronal · coronal · 0.63mm/px · 3 of 175 slices shown]
[im 35/175  lung]
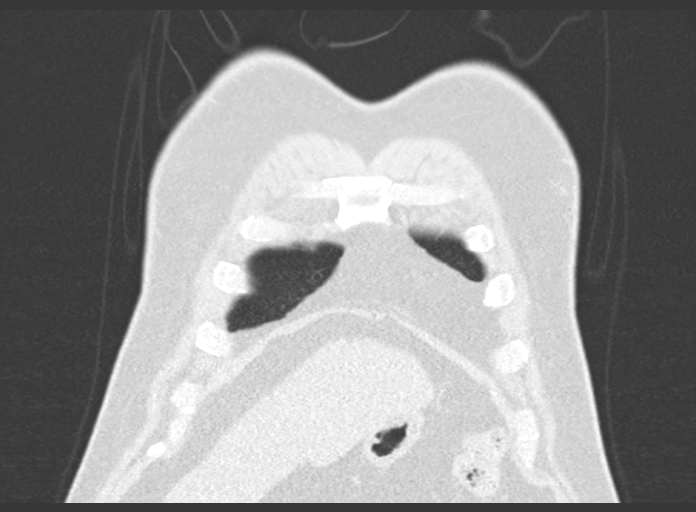
[im 70/175  lung]
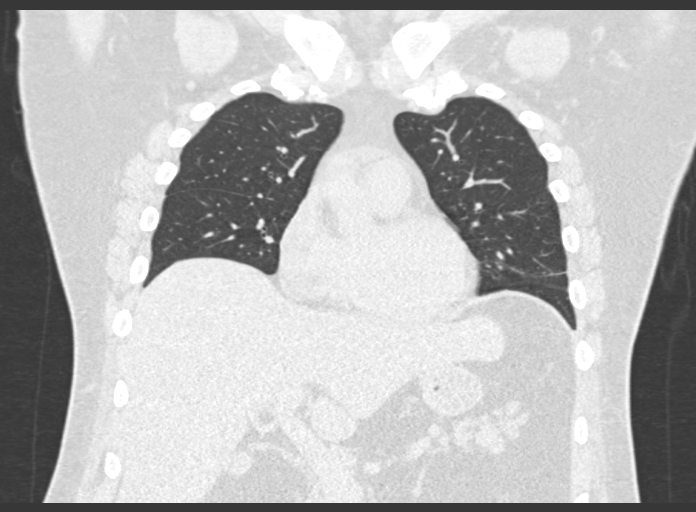
[im 105/175  lung]
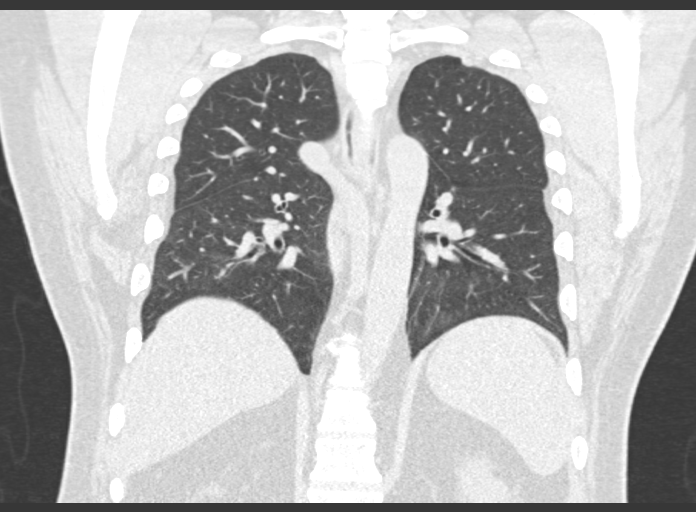

[15 of 36 positions shown; findings below may reference images not displayed]

FINDINGS: Cardiovascular: No significant vascular findings. Normal heart size.
No pericardial effusion.

Mediastinum/Nodes: No enlarged mediastinal or axillary lymph nodes.
Thyroid gland, trachea, and esophagus demonstrate no significant
findings.

Lungs/Pleura: Bilateral lower lobe and left upper lobe lingula
platelike atelectasis. No consolidation, effusion, or nodule
identified.

Upper Abdomen: No acute abnormality.

Musculoskeletal: No chest wall mass or suspicious bone lesions
identified. Left 2, 4, and 8 rib bone islands.
IMPRESSION: Bilateral lower lobe and left upper lobe lingula platelike
atelectasis. No consolidation, effusion, or nodule identified.

By: Paulus N Ceejay M.D.

## 2019-07-15 ENCOUNTER — Encounter: Payer: Self-pay | Admitting: Internal Medicine

## 2019-07-15 ENCOUNTER — Other Ambulatory Visit: Payer: Self-pay

## 2019-07-15 ENCOUNTER — Ambulatory Visit (INDEPENDENT_AMBULATORY_CARE_PROVIDER_SITE_OTHER): Payer: BC Managed Care – PPO | Admitting: Internal Medicine

## 2019-07-15 ENCOUNTER — Other Ambulatory Visit (INDEPENDENT_AMBULATORY_CARE_PROVIDER_SITE_OTHER): Payer: BC Managed Care – PPO

## 2019-07-15 VITALS — BP 146/98 | HR 100 | Temp 98.0°F | Ht 69.0 in | Wt 283.0 lb

## 2019-07-15 DIAGNOSIS — E785 Hyperlipidemia, unspecified: Secondary | ICD-10-CM

## 2019-07-15 DIAGNOSIS — Z125 Encounter for screening for malignant neoplasm of prostate: Secondary | ICD-10-CM

## 2019-07-15 DIAGNOSIS — E611 Iron deficiency: Secondary | ICD-10-CM

## 2019-07-15 DIAGNOSIS — E559 Vitamin D deficiency, unspecified: Secondary | ICD-10-CM

## 2019-07-15 DIAGNOSIS — Z0000 Encounter for general adult medical examination without abnormal findings: Secondary | ICD-10-CM

## 2019-07-15 DIAGNOSIS — N182 Chronic kidney disease, stage 2 (mild): Secondary | ICD-10-CM

## 2019-07-15 DIAGNOSIS — N183 Chronic kidney disease, stage 3 unspecified: Secondary | ICD-10-CM

## 2019-07-15 DIAGNOSIS — Z760 Encounter for issue of repeat prescription: Secondary | ICD-10-CM

## 2019-07-15 DIAGNOSIS — E1122 Type 2 diabetes mellitus with diabetic chronic kidney disease: Secondary | ICD-10-CM

## 2019-07-15 DIAGNOSIS — E538 Deficiency of other specified B group vitamins: Secondary | ICD-10-CM

## 2019-07-15 DIAGNOSIS — I10 Essential (primary) hypertension: Secondary | ICD-10-CM

## 2019-07-15 LAB — URINALYSIS, ROUTINE W REFLEX MICROSCOPIC
Bilirubin Urine: NEGATIVE
Hgb urine dipstick: NEGATIVE
Ketones, ur: NEGATIVE
Leukocytes,Ua: NEGATIVE
Nitrite: NEGATIVE
RBC / HPF: NONE SEEN (ref 0–?)
Specific Gravity, Urine: 1.02 (ref 1.000–1.030)
Urine Glucose: NEGATIVE
Urobilinogen, UA: 1 (ref 0.0–1.0)
WBC, UA: NONE SEEN (ref 0–?)
pH: 7 (ref 5.0–8.0)

## 2019-07-15 LAB — IBC PANEL
Iron: 62 ug/dL (ref 42–165)
Saturation Ratios: 17.6 % — ABNORMAL LOW (ref 20.0–50.0)
Transferrin: 251 mg/dL (ref 212.0–360.0)

## 2019-07-15 LAB — HEPATIC FUNCTION PANEL
ALT: 14 U/L (ref 0–53)
AST: 12 U/L (ref 0–37)
Albumin: 4.5 g/dL (ref 3.5–5.2)
Alkaline Phosphatase: 65 U/L (ref 39–117)
Bilirubin, Direct: 0.2 mg/dL (ref 0.0–0.3)
Total Bilirubin: 0.5 mg/dL (ref 0.2–1.2)
Total Protein: 7.7 g/dL (ref 6.0–8.3)

## 2019-07-15 LAB — VITAMIN D 25 HYDROXY (VIT D DEFICIENCY, FRACTURES): VITD: 43.39 ng/mL (ref 30.00–100.00)

## 2019-07-15 LAB — BASIC METABOLIC PANEL
BUN: 17 mg/dL (ref 6–23)
CO2: 32 mEq/L (ref 19–32)
Calcium: 10 mg/dL (ref 8.4–10.5)
Chloride: 102 mEq/L (ref 96–112)
Creatinine, Ser: 1.46 mg/dL (ref 0.40–1.50)
GFR: 61.36 mL/min (ref >60.00)
Glucose, Bld: 129 mg/dL — ABNORMAL HIGH (ref 70–99)
Potassium: 4.3 mEq/L (ref 3.5–5.1)
Sodium: 142 mEq/L (ref 135–145)

## 2019-07-15 LAB — CBC WITH DIFFERENTIAL/PLATELET
Basophils Absolute: 0.1 10*3/uL (ref 0.0–0.1)
Basophils Relative: 0.9 % (ref 0.0–3.0)
Eosinophils Absolute: 0.2 10*3/uL (ref 0.0–0.7)
Eosinophils Relative: 2 % (ref 0.0–5.0)
HCT: 48.5 % (ref 39.0–52.0)
Hemoglobin: 15.9 g/dL (ref 13.0–17.0)
Lymphocytes Relative: 22 % (ref 12.0–46.0)
Lymphs Abs: 1.7 10*3/uL (ref 0.7–4.0)
MCHC: 32.8 g/dL (ref 30.0–36.0)
MCV: 86 fl (ref 78.0–100.0)
Monocytes Absolute: 0.6 10*3/uL (ref 0.1–1.0)
Monocytes Relative: 7.4 % (ref 3.0–12.0)
Neutro Abs: 5.3 10*3/uL (ref 1.4–7.7)
Neutrophils Relative %: 67.7 % (ref 43.0–77.0)
Platelets: 437 10*3/uL — ABNORMAL HIGH (ref 150.0–400.0)
RBC: 5.64 Mil/uL (ref 4.22–5.81)
RDW: 14.7 % (ref 11.5–15.5)
WBC: 7.8 10*3/uL (ref 4.0–10.5)

## 2019-07-15 LAB — LIPID PANEL
Cholesterol: 137 mg/dL (ref 0–200)
HDL: 35 mg/dL — ABNORMAL LOW (ref >39.00)
LDL Cholesterol: 69 mg/dL (ref 0–99)
NonHDL: 101.82
Total CHOL/HDL Ratio: 4
Triglycerides: 163 mg/dL — ABNORMAL HIGH (ref 0.0–149.0)
VLDL: 32.6 mg/dL (ref 0.0–40.0)

## 2019-07-15 LAB — PSA: PSA: 1.31 ng/mL (ref 0.10–4.00)

## 2019-07-15 LAB — VITAMIN B12: Vitamin B-12: 508 pg/mL (ref 211–911)

## 2019-07-15 LAB — TSH: TSH: 1.59 u[IU]/mL (ref 0.35–4.50)

## 2019-07-15 MED ORDER — METFORMIN HCL 500 MG PO TABS: 1000.0000 mg | ORAL_TABLET | Freq: Every day | ORAL | 3 refills | Status: DC

## 2019-07-15 MED ORDER — GABAPENTIN 300 MG PO CAPS: 300.0000 mg | ORAL_CAPSULE | Freq: Every evening | ORAL | 1 refills | Status: DC | PRN

## 2019-07-15 MED ORDER — AMLODIPINE BESYLATE 5 MG PO TABS: 5.0000 mg | ORAL_TABLET | Freq: Every day | ORAL | 3 refills | Status: DC

## 2019-07-15 MED ORDER — LISINOPRIL-HYDROCHLOROTHIAZIDE 20-25 MG PO TABS: 1.0000 | ORAL_TABLET | Freq: Every day | ORAL | 3 refills | Status: DC

## 2019-07-15 MED ORDER — ATORVASTATIN CALCIUM 20 MG PO TABS: 20.0000 mg | ORAL_TABLET | Freq: Every day | ORAL | 3 refills | Status: DC

## 2019-07-15 NOTE — Patient Instructions (Addendum)
Ok to start the amlodipine 5 mg per day for blood pressure  Ok to re-start the lipitor 20 mg per day, as this appears to have been a mistake to stop this  Please continue all other medications as before, including the lisinopril HCT at 1 pill per day, and the gabapentin  OK to change the metformin XR to the regular metformin 1000 mg with supper due to the recall  Please have the Pharmacy call with any other refills you may need.  Please continue your efforts at being more active, low cholesterol diet, and weight control.  You are otherwise up to date with prevention measures today.  Please keep your appointments with your specialists as you may have planned  You will be contacted regarding the referral for: Nephrology  Please go to the LAB in the Basement (turn left off the elevator) for the tests to be done today  You will be contacted by phone if any changes need to be made immediately.  Otherwise, you will receive a letter about your results with an explanation, but please check with MyChart first.  Please remember to sign up for MyChart if you have not done so, as this will be important to you in the future with finding out test results, communicating by private email, and scheduling acute appointments online when needed.  Please return in 6 months, or sooner if needed

## 2019-07-15 NOTE — Progress Notes (Signed)
Subjective:    Patient ID: Raymond Ryan, male    DOB: [birth year], 53 y.o.   MRN: [id number]  HPI  Here for wellness and f/u;  Overall doing ok;  Pt denies Chest pain, worsening SOB, DOE, wheezing, orthopnea, PND, worsening LE edema, palpitations, dizziness or syncope.  Pt denies neurological change such as new headache, facial or extremity weakness.  Pt denies polydipsia, polyuria, or low sugar symptoms. Pt states overall good compliance with treatment and medications, good tolerability, and has been trying to follow appropriate diet.  Pt denies worsening depressive symptoms, suicidal ideation or panic. No fever, night sweats, wt loss, loss of appetite, or other constitutional symptoms.  Pt states good ability with ADL's, has low fall risk, home safety reviewed and adequate, no other significant changes in hearing or vision, and only occasionally active with exercise.  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  Has not yet started amlodipine 5 mg with some confusion at the pharmacy. Did not like the libre sugar monitor and has gone back to fingersticks.   Past Medical History:  Diagnosis Date  . Charcot foot due to diabetes mellitus ([place])   . Constipation    sometimes soft, some hard -- does go daily - are on fiber   . Diabetes mellitus   . GERD (gastroesophageal reflux disease)   . Hyperlipidemia   . Hypertension   . Neuromuscular disorder ([place])    neuropathy   Past Surgical History:  Procedure Laterality Date  . UPPER GASTROINTESTINAL ENDOSCOPY  2002   Dr Collene Mares     reports that he has never smoked. He has never used smokeless tobacco. He reports that he does not drink alcohol or use drugs. family history includes CAD in his mother; Diabetes Mellitus II in his mother; Prostate cancer in his father, maternal uncle, and maternal uncle. No Known Allergies Current Outpatient Medications on File Prior to Visit  Medication Sig Dispense  Refill  . Blood Glucose Monitoring Suppl (ONETOUCH VERIO FLEX SYSTEM) w/Device KIT by Does not apply route.    Marland Kitchen glucose blood test strip 1 each by Other route as needed for other. Use as instructed to check blood sugar three times daily.. 100 each 3  . insulin aspart (NOVOLOG FLEXPEN) 100 UNIT/ML FlexPen INJECT 24 UNITS UNDER THE SKIN ONCE DAILY AT BREAKFAST. 15 mL 1  . Insulin Degludec (TRESIBA FLEXTOUCH) 200 UNIT/ML SOPN Inject 50 Units into the skin daily. Increase dose to 54 units if morning sugars consistently over 140 3 pen 1  . Insulin Glargine (BASAGLAR KWIKPEN) 100 UNIT/ML SOPN INJECT 60 UNITS INTO THE SKIN AT BEDTIME. NEEDS APPT FOR MORE REFILLS. 15 mL 0  . Insulin Pen Needle 31G X 5 MM MISC Use for insulin 4 times a day 100 each 1  . Insulin Syringes, Disposable, U-100 0.3 ML MISC Inject 1 each into the skin 3 (three) times daily. 100 each 11  . Semaglutide,0.25 or 0.'5MG'$ /DOS, (OZEMPIC, 0.25 OR 0.5 MG/DOSE,) 2 MG/1.5ML SOPN Inject 0.25 mg into the skin once a week. 1 pen 6   No current facility-administered medications on file prior to visit.    Review of Systems Constitutional: Negative for other unusual diaphoresis, sweats, appetite or weight changes HENT: Negative for other worsening hearing loss, ear pain, facial swelling, mouth sores or neck stiffness.   Eyes: Negative for other worsening pain, redness or other visual disturbance.  Respiratory: Negative for other stridor or swelling  Cardiovascular: Negative for other palpitations or other chest pain  Gastrointestinal: Negative for worsening diarrhea or loose stools, blood in stool, distention or other pain Genitourinary: Negative for hematuria, flank pain or other change in urine volume.  Musculoskeletal: Negative for myalgias or other joint swelling.  Skin: Negative for other color change, or other wound or worsening drainage.  Neurological: Negative for other syncope or numbness. Hematological: Negative for other adenopathy  or swelling Psychiatric/Behavioral: Negative for hallucinations, other worsening agitation, SI, self-injury, or new decreased concentration All other system neg per pt    Objective:   Physical Exam BP (!) 146/98   Pulse 100   Temp 98 F (36.7 C) (Oral)   Ht '5\' 9"'$  (1.753 m)   Wt 283 lb (128.4 kg)   SpO2 96%   BMI 41.79 kg/m  VS noted,  Constitutional: Pt is oriented to person, place, and time. Appears well-developed and well-nourished, in no significant distress and comfortable Head: Normocephalic and atraumatic  Eyes: Conjunctivae and EOM are normal. Pupils are equal, round, and reactive to light Right Ear: External ear normal without discharge Left Ear: External ear normal without discharge Nose: Nose without discharge or deformity Mouth/Throat: Oropharynx is without other ulcerations and moist  Neck: Normal range of motion. Neck supple. No JVD present. No tracheal deviation present or significant neck LA or mass Cardiovascular: Normal rate, regular rhythm, normal heart sounds and intact distal pulses.   Pulmonary/Chest: WOB normal and breath sounds without rales or wheezing  Abdominal: Soft. Bowel sounds are normal. NT. No HSM  Musculoskeletal: Normal range of motion. Exhibits no edema Lymphadenopathy: Has no other cervical adenopathy.  Neurological: Pt is alert and oriented to person, place, and time. Pt has normal reflexes. No cranial nerve deficit. Motor grossly intact, Gait intact Skin: Skin is warm and dry. No rash noted or new ulcerations Psychiatric:  Has normal mood and affect. Behavior is normal without agitation No other exam findings Lab Results  Component Value Date   WBC 7.8 07/15/2019   HGB 15.9 07/15/2019   HCT 48.5 07/15/2019   PLT 437.0 (H) 07/15/2019   GLUCOSE 129 (H) 07/15/2019   CHOL 137 07/15/2019   TRIG 163.0 (H) 07/15/2019   HDL 35.00 (L) 07/15/2019   LDLCALC 69 07/15/2019   ALT 14 07/15/2019   AST 12 07/15/2019   NA 142 07/15/2019   K 4.3  07/15/2019   CL 102 07/15/2019   CREATININE 1.46 07/15/2019   BUN 17 07/15/2019   CO2 32 07/15/2019   TSH 1.59 07/15/2019   PSA 1.31 07/15/2019   HGBA1C 8.4 (H) 05/10/2019   MICROALBUR 4.2 (H) 11/11/2018      Assessment & Plan:

## 2019-07-18 ENCOUNTER — Encounter: Payer: Self-pay | Admitting: Internal Medicine

## 2019-07-18 DIAGNOSIS — N183 Chronic kidney disease, stage 3 unspecified: Secondary | ICD-10-CM | POA: Insufficient documentation

## 2019-07-18 NOTE — Assessment & Plan Note (Addendum)
New dx recent worsening, likely related to DM, HTN nephropathy; to cont ace and refer nephrology  In addition to the time spent performing CPE, I spent an additional 15 minutes face to face,in which greater than 50% of this time was spent in counseling and coordination of care for patient's illness as documented, including the differential dx, treatment, further evaluation and other management of new onset CKD, DM, HTN, HLD

## 2019-07-18 NOTE — Assessment & Plan Note (Signed)

## 2019-07-18 NOTE — Assessment & Plan Note (Signed)
Ok to start amlodipine 5, and continue the lis-hct 1 per day as intended

## 2019-07-18 NOTE — Assessment & Plan Note (Signed)
Due to recall, change metformin ER to metformin

## 2019-07-18 NOTE — Assessment & Plan Note (Signed)
Appears from last visit the lipitor was stopped; pt does not know why and not mentioned in notes; I suspect this was inadvertant; ok to restart lipitor

## 2019-07-24 ENCOUNTER — Other Ambulatory Visit: Payer: Self-pay | Admitting: Internal Medicine

## 2019-07-28 ENCOUNTER — Other Ambulatory Visit: Payer: Self-pay | Admitting: Endocrinology

## 2019-08-30 ENCOUNTER — Other Ambulatory Visit (INDEPENDENT_AMBULATORY_CARE_PROVIDER_SITE_OTHER): Payer: BC Managed Care – PPO

## 2019-08-30 ENCOUNTER — Other Ambulatory Visit: Payer: Self-pay | Admitting: Endocrinology

## 2019-08-30 ENCOUNTER — Other Ambulatory Visit: Payer: Self-pay

## 2019-08-30 DIAGNOSIS — E1165 Type 2 diabetes mellitus with hyperglycemia: Secondary | ICD-10-CM | POA: Diagnosis not present

## 2019-08-30 DIAGNOSIS — E7800 Pure hypercholesterolemia, unspecified: Secondary | ICD-10-CM | POA: Diagnosis not present

## 2019-08-30 DIAGNOSIS — E1122 Type 2 diabetes mellitus with diabetic chronic kidney disease: Secondary | ICD-10-CM

## 2019-08-30 DIAGNOSIS — Z794 Long term (current) use of insulin: Secondary | ICD-10-CM

## 2019-08-30 DIAGNOSIS — N182 Chronic kidney disease, stage 2 (mild): Secondary | ICD-10-CM | POA: Diagnosis not present

## 2019-08-30 LAB — COMPREHENSIVE METABOLIC PANEL
ALT: 16 U/L (ref 0–53)
AST: 14 U/L (ref 0–37)
Albumin: 4.4 g/dL (ref 3.5–5.2)
Alkaline Phosphatase: 59 U/L (ref 39–117)
BUN: 22 mg/dL (ref 6–23)
CO2: 29 mEq/L (ref 19–32)
Calcium: 10.1 mg/dL (ref 8.4–10.5)
Chloride: 103 mEq/L (ref 96–112)
Creatinine, Ser: 1.67 mg/dL — ABNORMAL HIGH (ref 0.40–1.50)
GFR: 52.52 mL/min — ABNORMAL LOW (ref >60.00)
Glucose, Bld: 160 mg/dL — ABNORMAL HIGH (ref 70–99)
Potassium: 4.7 mEq/L (ref 3.5–5.1)
Sodium: 141 mEq/L (ref 135–145)
Total Bilirubin: 0.6 mg/dL (ref 0.2–1.2)
Total Protein: 8.1 g/dL (ref 6.0–8.3)

## 2019-08-30 LAB — LIPID PANEL
Cholesterol: 111 mg/dL (ref 0–200)
HDL: 33.9 mg/dL — ABNORMAL LOW (ref >39.00)
LDL Cholesterol: 52 mg/dL (ref 0–99)
NonHDL: 77.25
Total CHOL/HDL Ratio: 3
Triglycerides: 126 mg/dL (ref 0.0–149.0)
VLDL: 25.2 mg/dL (ref 0.0–40.0)

## 2019-08-30 LAB — HEMOGLOBIN A1C: Hgb A1c MFr Bld: 8.1 % — ABNORMAL HIGH (ref 4.6–6.5)

## 2019-08-31 ENCOUNTER — Other Ambulatory Visit: Payer: Self-pay

## 2019-08-31 ENCOUNTER — Other Ambulatory Visit: Payer: Self-pay | Admitting: Nephrology

## 2019-08-31 DIAGNOSIS — N183 Chronic kidney disease, stage 3 unspecified: Secondary | ICD-10-CM

## 2019-08-31 LAB — FRUCTOSAMINE: Fructosamine: 325 umol/L — ABNORMAL HIGH (ref 0–285)

## 2019-09-02 ENCOUNTER — Encounter: Payer: Self-pay | Admitting: Endocrinology

## 2019-09-02 ENCOUNTER — Ambulatory Visit: Payer: BC Managed Care – PPO | Admitting: Endocrinology

## 2019-09-02 ENCOUNTER — Other Ambulatory Visit: Payer: Self-pay

## 2019-09-02 VITALS — BP 138/90 | HR 111 | Ht 69.0 in | Wt 284.4 lb

## 2019-09-02 DIAGNOSIS — E1165 Type 2 diabetes mellitus with hyperglycemia: Secondary | ICD-10-CM

## 2019-09-02 DIAGNOSIS — I10 Essential (primary) hypertension: Secondary | ICD-10-CM

## 2019-09-02 DIAGNOSIS — Z794 Long term (current) use of insulin: Secondary | ICD-10-CM | POA: Diagnosis not present

## 2019-09-02 DIAGNOSIS — N289 Disorder of kidney and ureter, unspecified: Secondary | ICD-10-CM

## 2019-09-02 MED ORDER — NOVOLOG FLEXPEN 100 UNIT/ML ~~LOC~~ SOPN: PEN_INJECTOR | SUBCUTANEOUS | 0 refills | Status: DC

## 2019-09-02 NOTE — Progress Notes (Signed)
Patient ID: Raymond Ryan, male   DOB: 06/02/1966, 53 y.o.   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 .  NovoLog 20 units before meals recently 1  Non-insulin hypoglycemic drugs the patient is taking are: Metformin 500 mg bid  daily.  Ozempic 0.5 mg weekly  Current management, blood sugar patterns and problems identified:  He is not taking any NovoLog since a couple of months ago as he says that his refills ran out and he did not call us  As before he is not checking readings after meals  He tried the freestyle libre but because after couple of days he had bleeding at the site on the outer arm he took it off and did not start back.  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       Blood Glucose readings by time of day as below  FASTING blood sugars range from 82 up to 166  Suppertime range 131-215 Overall glucose average 149  Previous readings  PRE-MEAL Fasting Lunch Dinner  overnight Overall  Glucose range:  135-173   96-181  111-146   Mean/median:  152  129  130     POST-MEAL PC Breakfast PC Lunch PC Dinner  Glucose range:   107-214 ?  Mean/median:   [place] visit, most recent: Never  Weight history:  Wt Readings from Last 3 Encounters:  09/02/19 284 lb 6.4 oz (129 kg)  07/15/19 283 lb (128.4 kg)  06/01/19 283 lb 9.6 oz (128.6 kg)    Glycemic control:   Lab Results  Component Value Date   HGBA1C 8.1 (H) 08/30/2019   HGBA1C 8.4 (H) 05/10/2019   HGBA1C 8.4 (H) 02/19/2019   Lab Results  Component Value Date   MICROALBUR 4.2 (  H) 11/11/2018   LDLCALC 52 08/30/2019   CREATININE 1.67 (H) 08/30/2019   Lab Results  Component Value Date   MICRALBCREAT 2.5 11/11/2018    Lab Results  Component Value Date   FRUCTOSAMINE 325 (H) 08/30/2019   FRUCTOSAMINE 333 (H) 12/16/2018    Lab on 08/30/2019  Component Date Value Ref Range Status  . Cholesterol 08/30/2019 111  0 - 200 mg/dL Final   ATP III Classification       Desirable:  < 200 mg/dL               Borderline High:  200 - 239 mg/dL          High:  > = 240 mg/dL  . Triglycerides 08/30/2019 126.0  0.0 - 149.0 mg/dL Final   Normal:  <150 mg/dLBorderline High:  150 - 199 mg/dL  . HDL 08/30/2019 33.90* >39.00 mg/dL Final  . VLDL 08/30/2019 25.2  0.0 - 40.0 mg/dL Final  . LDL Cholesterol 08/30/2019 52  0 - 99 mg/dL Final  . Total CHOL/HDL Ratio 08/30/2019 3   Final                  Men          Women1/2 Average Risk     3.4          3.3Average Risk          5.0          4.42X Average Risk          9.6          7.13X  Average Risk          15.0          11.0                      . NonHDL 08/30/2019 77.25   Final   NOTE:  Non-HDL goal should be 30 mg/dL higher than patient's LDL goal (i.e. LDL goal of < 70 mg/dL, would have non-HDL goal of < 100 mg/dL)  . Fructosamine 08/30/2019 325* 0 - 285 umol/L Final   Comment: Published reference interval for apparently healthy subjects between age 16 and 79 is 56 - 285 umol/L and in a poorly controlled diabetic population is 228 - 563 umol/L with a mean of 396 umol/L.   Marland Kitchen Sodium 08/30/2019 141  135 - 145 mEq/L Final  . Potassium 08/30/2019 4.7  3.5 - 5.1 mEq/L Final  . Chloride 08/30/2019 103  96 - 112 mEq/L Final  . CO2 08/30/2019 29  19 - 32 mEq/L Final  . Glucose, Bld 08/30/2019 160* 70 - 99 mg/dL Final  . BUN 08/30/2019 22  6 - 23 mg/dL Final  . Creatinine, Ser 08/30/2019 1.67* 0.40 - 1.50 mg/dL Final  . Total Bilirubin 08/30/2019 0.6  0.2 - 1.2 mg/dL Final  . Alkaline Phosphatase 08/30/2019 59  39 - 117 U/L Final  . AST 08/30/2019 14  0 - 37 U/L Final  . ALT 08/30/2019 16  0 - 53 U/L Final  . Total Protein 08/30/2019 8.1  6.0 - 8.3 g/dL Final  . Albumin 08/30/2019 4.4  3.5 - 5.2 g/dL Final  . Calcium 08/30/2019 10.1  8.4 - 10.5 mg/dL Final  . GFR 08/30/2019 52.52* >60.00 mL/min Final  . Hgb A1c MFr Bld 08/30/2019 8.1* 4.6 - 6.5 % Final   Glycemic Control Guidelines for People with Diabetes:Non Diabetic:  <6%Goal of Therapy: <7%Additional Action Suggested:  >  8%     Allergies as of 09/02/2019   No Known Allergies     Medication List       Accurate as of September 02, 2019 10:18 AM. If you have any questions, ask your nurse or doctor.        STOP taking these medications   Basaglar KwikPen 100 UNIT/ML Sopn Stopped by: Elayne Snare, MD     TAKE these medications   amLODipine 5 MG tablet Commonly known as: NORVASC Take 1 tablet (5 mg total) by mouth daily.   atorvastatin 20 MG tablet Commonly known as: Lipitor Take 1 tablet (20 mg total) by  mouth daily.   gabapentin 300 MG capsule Commonly known as: NEURONTIN Take 1 capsule (300 mg total) by mouth at bedtime as needed.   glucose blood test strip 1 each by Other route as needed for other. Use as instructed to check blood sugar three times daily..   insulin aspart 100 UNIT/ML FlexPen Commonly known as: NovoLOG FlexPen INJECT 24 UNITS UNDER THE SKIN ONCE DAILY AT BREAKFAST.   Insulin Pen Needle 31G X 5 MM Misc Use for insulin 4 times a day   Insulin Syringes (Disposable) U-100 0.3 ML Misc Inject 1 each into the skin 3 (three) times daily.   lisinopril-hydrochlorothiazide 20-25 MG tablet Commonly known as: ZESTORETIC Take 1 tablet by mouth daily.   metFORMIN 500 MG tablet Commonly known as: GLUCOPHAGE Take 2 tablets (1,000 mg total) by mouth daily with supper.   OneTouch Verio Flex System w/Device Kit by Does not apply route.   Semaglutide(0.25 or 0.'5MG'$ /DOS) 2 MG/1.5ML Sopn Commonly known as: Ozempic (0.25 or 0.5 MG/DOSE) Inject 0.25 mg into the skin once a week.   Tyler Aas FlexTouch 200 UNIT/ML Sopn Generic drug: Insulin Degludec INJECT 50 UNITS INTO THE SKIN DAILY. INCREASE DOSE TO 54 UNITS IF MORNING SUGARS CONSISTENTLY OVER 140 What changed: See the new instructions.       Allergies: No Known Allergies  Past Medical History:  Diagnosis Date  . Charcot foot due to diabetes mellitus ([place])   . Constipation    sometimes soft, some hard -- does go daily - are on fiber   . Diabetes mellitus   . GERD (gastroesophageal reflux disease)   . Hyperlipidemia   . Hypertension   . Neuromuscular disorder ([place])    neuropathy    Past Surgical History:  Procedure Laterality Date  . UPPER GASTROINTESTINAL ENDOSCOPY  2002   Dr Collene Mares     Family History  Problem Relation Age of Onset  . Diabetes Mellitus II Mother   . CAD Mother   . Prostate cancer Father   . Prostate cancer Maternal Uncle   . Prostate cancer Maternal Uncle   . Colon cancer Neg Hx   . Colon  polyps Neg Hx     Social History:  reports that he has never smoked. He has never used smokeless tobacco. He reports that he does not drink alcohol or use drugs.   Review of Systems  Lipid history: Has been treated by PCP with 20 mg atorvastatin with adequate control He has been back on atorvastatin recently    Lab Results  Component Value Date   CHOL 111 08/30/2019   HDL 33.90 (L) 08/30/2019   LDLCALC 52 08/30/2019   TRIG 126.0 08/30/2019   CHOLHDL 3 08/30/2019           Hypertension: Has been treated with Zestoretic This is followed by nephrologist and PCP now  No  home BP monitoring  BP Readings from Last 3 Encounters:  09/02/19 138/90  07/15/19 (!) 146/98  06/01/19 136/82    Most recent eye exam was in 2/19  Most recent foot exam: 11/19  He takes gabapentin at night from his PCP for neuropathic pain  Currently known complications of diabetes: Neuropathy, erectile dysfunction, unknown status of retinopathy  Has a renal dysfunction of unclear etiology and this appears to be worse now  Lab Results  Component Value Date   CREATININE 1.67 (H) 08/30/2019   CREATININE 1.46 07/15/2019   CREATININE 1.85 (H) 05/10/2019     LABS:  Lab on 08/30/2019  Component Date Value Ref Range Status  . Cholesterol 08/30/2019 111  0 - 200 mg/dL Final   ATP III Classification       Desirable:  < 200 mg/dL               Borderline High:  200 - 239 mg/dL          High:  > = 240 mg/dL  . Triglycerides 08/30/2019 126.0  0.0 - 149.0 mg/dL Final   Normal:  <150 mg/dLBorderline High:  150 - 199 mg/dL  . HDL 08/30/2019 33.90* >39.00 mg/dL Final  . VLDL 08/30/2019 25.2  0.0 - 40.0 mg/dL Final  . LDL Cholesterol 08/30/2019 52  0 - 99 mg/dL Final  . Total CHOL/HDL Ratio 08/30/2019 3   Final                  Men          Women1/2 Average Risk     3.4          3.3Average Risk          5.0          4.42X Average Risk          9.6          7.13X Average Risk          15.0          11.0                       . NonHDL 08/30/2019 77.25   Final   NOTE:  Non-HDL goal should be 30 mg/dL higher than patient's LDL goal (i.e. LDL goal of < 70 mg/dL, would have non-HDL goal of < 100 mg/dL)  . Fructosamine 08/30/2019 325* 0 - 285 umol/L Final   Comment: Published reference interval for apparently healthy subjects between age 68 and 49 is 60 - 285 umol/L and in a poorly controlled diabetic population is 228 - 563 umol/L with a mean of 396 umol/L.   Marland Kitchen Sodium 08/30/2019 141  135 - 145 mEq/L Final  . Potassium 08/30/2019 4.7  3.5 - 5.1 mEq/L Final  . Chloride 08/30/2019 103  96 - 112 mEq/L Final  . CO2 08/30/2019 29  19 - 32 mEq/L Final  . Glucose, Bld 08/30/2019 160* 70 - 99 mg/dL Final  . BUN 08/30/2019 22  6 - 23 mg/dL Final  . Creatinine, Ser 08/30/2019 1.67* 0.40 - 1.50 mg/dL Final  . Total Bilirubin 08/30/2019 0.6  0.2 - 1.2 mg/dL Final  . Alkaline Phosphatase 08/30/2019 59  39 - 117 U/L Final  . AST 08/30/2019 14  0 - 37 U/L Final  . ALT 08/30/2019 16  0 - 53 U/L Final  . Total Protein 08/30/2019 8.1  6.0 - 8.3 g/dL Final  . Albumin 08/30/2019  4.4  3.5 - 5.2 g/dL Final  . Calcium 08/30/2019 10.1  8.4 - 10.5 mg/dL Final  . GFR 08/30/2019 52.52* >60.00 mL/min Final  . Hgb A1c MFr Bld 08/30/2019 8.1* 4.6 - 6.5 % Final   Glycemic Control Guidelines for People with Diabetes:Non Diabetic:  <6%Goal of Therapy: <7%Additional Action Suggested:  >8%     Physical Examination:  BP 138/90 (BP Location: Left Arm, Patient Position: Sitting, Cuff Size: Large)   Pulse (!) 111   Ht '5\' 9"'$  (1.753 m)   Wt 284 lb 6.4 oz (129 kg)   SpO2 98%   BMI 42.00 kg/m     ASSESSMENT:  Diabetes type 2 with BMI 42  See history of present illness for detailed discussion of current diabetes management, blood sugar patterns and problems identified  His A1c is still high at 8.4  Although his fasting blood sugars are high they are improved and continue to improve with using Ozempic He also is likely  doing better with his diet with some weight loss Currently also taking less NovoLog at mealtimes, previously taking up to 30 units  However his blood sugars are fluctuating during the day and he is not adequately covering his lunch meals adequately or skipping the NovoLog He is tending to have higher readings if he is eating higher fat meal also even with less carbohydrate Currently not checking readings after dinner despite reminders  He probably does need a higher dose of basal insulin but may also benefit from a more consistent 24-hour effect with [place] instead of Basaglar Also not exercising which will help his control    HYPERTENSION: Currently not controlled with current doses of Zestoretic even with adding amlodipine from PCP However he will need to check blood pressure readings at home also to make sure he does not have whitecoat syndrome  Renal dysfunction: 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  Regular exercise recommended  3.  Medication changes needed: He will need to start back on NovoLog since his overall control is not as good and he still has postprandial hyperglycemia at least on some days He will need to cut back on any higher fat or high carbohydrate meals No change in [place] Discussed that NovoLog will need to be adjusted based on his postprandial readings which he is not monitoring He will need to check readings periodically about 2 to 3 hours after both lunch and supper Not clear how much NovoLog he needs but likely between 14-20 units based on his meal size  Currently not working and he wants a note to say that  he is at high 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    Elayne Snare 09/02/2019, 10:18 AM   Note: This office note was prepared with Dragon voice recognition system technology. Any transcriptional errors that result from this process are unintentional.

## 2019-09-02 NOTE — Patient Instructions (Addendum)
Check blood sugars on waking up 3 days a week  Also check blood sugars about 2 hours after meals and do this after different meals by rotation  Recommended blood sugar levels on waking up are 90-130 and about 2 hours after meal is 130-180  Please bring your blood sugar monitor to each visit, thank you  Adjust Novolog based on after meal sugars

## 2019-09-07 ENCOUNTER — Ambulatory Visit
Admission: RE | Admit: 2019-09-07 | Discharge: 2019-09-07 | Disposition: A | Discharge status: Home / Self Care | Payer: BC Managed Care – PPO | Source: Ambulatory Visit | Attending: Nephrology | Admitting: Nephrology

## 2019-09-07 DIAGNOSIS — N183 Chronic kidney disease, stage 3 unspecified: Secondary | ICD-10-CM

## 2019-09-20 ENCOUNTER — Encounter: Payer: Self-pay | Admitting: Internal Medicine

## 2019-09-20 ENCOUNTER — Ambulatory Visit (INDEPENDENT_AMBULATORY_CARE_PROVIDER_SITE_OTHER): Payer: BC Managed Care – PPO | Admitting: Internal Medicine

## 2019-09-20 ENCOUNTER — Other Ambulatory Visit: Payer: Self-pay

## 2019-09-20 VITALS — BP 136/86 | HR 96 | Temp 98.0°F | Ht 69.0 in | Wt 284.0 lb

## 2019-09-20 DIAGNOSIS — N183 Chronic kidney disease, stage 3 unspecified: Secondary | ICD-10-CM

## 2019-09-20 DIAGNOSIS — N182 Chronic kidney disease, stage 2 (mild): Secondary | ICD-10-CM

## 2019-09-20 DIAGNOSIS — E785 Hyperlipidemia, unspecified: Secondary | ICD-10-CM | POA: Diagnosis not present

## 2019-09-20 DIAGNOSIS — I10 Essential (primary) hypertension: Secondary | ICD-10-CM | POA: Diagnosis not present

## 2019-09-20 DIAGNOSIS — E669 Obesity, unspecified: Secondary | ICD-10-CM | POA: Insufficient documentation

## 2019-09-20 DIAGNOSIS — E1122 Type 2 diabetes mellitus with diabetic chronic kidney disease: Secondary | ICD-10-CM

## 2019-09-20 NOTE — Assessment & Plan Note (Signed)
With BMI > 40 , for bariatric referral

## 2019-09-20 NOTE — Assessment & Plan Note (Signed)
stable overall by history and exam, recent data reviewed with pt, and pt to continue medical treatment as before,  to f/u any worsening symptoms or concerns  

## 2019-09-20 NOTE — Progress Notes (Signed)
Subjective:    Patient ID: Raymond Ryan, male    DOB: 02/15/1966, 53 y.o.   MRN: [id number]  HPI  Here to f/u; overall doing ok,  Pt denies chest pain, increasing sob or doe, wheezing, orthopnea, PND, increased LE swelling, palpitations, dizziness or syncope.  Pt denies new neurological symptoms such as new headache, or facial or extremity weakness or numbness.  Pt denies polydipsia, polyuria, or low sugar episode.  Pt states overall good compliance with meds, mostly trying to follow appropriate diet, with wt overall stable,  but little exercise however. Has been out of work due to pandemic and hx of DM, now for forms to be filled out for return to work.  Has ongoing difficulty with losing wt, in fact has increased wt recently, now may qualify per BMI, has been through the initial classes.   Past Medical History:  Diagnosis Date  . Charcot foot due to diabetes mellitus ([place])   . Constipation    sometimes soft, some hard -- does go daily - are on fiber   . Diabetes mellitus   . GERD (gastroesophageal reflux disease)   . Hyperlipidemia   . Hypertension   . Neuromuscular disorder ([place])    neuropathy   Past Surgical History:  Procedure Laterality Date  . UPPER GASTROINTESTINAL ENDOSCOPY  2002   Dr Collene Mares     reports that he has never smoked. He has never used smokeless tobacco. He reports that he does not drink alcohol or use drugs. family history includes CAD in his mother; Diabetes Mellitus II in his mother; Prostate cancer in his father, maternal uncle, and maternal uncle. No Known Allergies Current Outpatient Medications on File Prior to Visit  Medication Sig Dispense Refill  . amLODipine (NORVASC) 5 MG tablet Take 1 tablet (5 mg total) by mouth daily. 90 tablet 3  . atorvastatin (LIPITOR) 20 MG tablet Take 1 tablet (20 mg total) by mouth daily. 90 tablet 3  . Blood Glucose Monitoring Suppl (ONETOUCH VERIO FLEX SYSTEM) w/Device KIT by Does not apply route.    . gabapentin  (NEURONTIN) 300 MG capsule Take 1 capsule (300 mg total) by mouth at bedtime as needed. 90 capsule 1  . glucose blood test strip 1 each by Other route as needed for other. Use as instructed to check blood sugar three times daily.. 100 each 3  . insulin aspart (NOVOLOG FLEXPEN) 100 UNIT/ML FlexPen INJECT upto 24 UNITS UNDER THE SKIN before meals 15 mL 0  . Insulin Pen Needle 31G X 5 MM MISC Use for insulin 4 times a day 100 each 1  . Insulin Syringes, Disposable, U-100 0.3 ML MISC Inject 1 each into the skin 3 (three) times daily. 100 each 11  . lisinopril-hydrochlorothiazide (ZESTORETIC) 20-25 MG tablet Take 1 tablet by mouth daily. 90 tablet 3  . metFORMIN (GLUCOPHAGE) 500 MG tablet Take 2 tablets (1,000 mg total) by mouth daily with supper. 180 tablet 3  . Semaglutide,0.25 or 0.5MG/DOS, (OZEMPIC, 0.25 OR 0.5 MG/DOSE,) 2 MG/1.5ML SOPN Inject 0.25 mg into the skin once a week. 1 pen 6  . TRESIBA FLEXTOUCH 200 UNIT/ML SOPN INJECT 50 UNITS INTO THE SKIN DAILY. INCREASE DOSE TO 54 UNITS IF MORNING SUGARS CONSISTENTLY OVER 140 (Patient taking differently: Inject 50 Units into the skin daily. Inject 50 units under the skin once daily.) 4 pen 1   No current facility-administered medications on file prior to visit.    Review of Systems  Constitutional: Negative for other unusual  diaphoresis or sweats HENT: Negative for ear discharge or swelling Eyes: Negative for other worsening visual disturbances Respiratory: Negative for stridor or other swelling  Gastrointestinal: Negative for worsening distension or other blood Genitourinary: Negative for retention or other urinary change Musculoskeletal: Negative for other MSK pain or swelling Skin: Negative for color change or other new lesions Neurological: Negative for worsening tremors and other numbness  Psychiatric/Behavioral: Negative for worsening agitation or other fatigue All otherwise neg per pt    Objective:   Physical Exam BP 136/86   Pulse 96    Temp 98 F (36.7 C) (Oral)   Ht 5' 9" (1.753 m)   Wt 284 lb (128.8 kg)   SpO2 99%   BMI 41.94 kg/m  VS noted,  Constitutional: Pt appears in NAD HENT: Head: NCAT.  Right Ear: External ear normal.  Left Ear: External ear normal.  Eyes: . Pupils are equal, round, and reactive to light. Conjunctivae and EOM are normal Nose: without d/c or deformity Neck: Neck supple. Gross normal ROM Cardiovascular: Normal rate and regular rhythm.   Pulmonary/Chest: Effort normal and breath sounds without rales or wheezing.  Abd:  Soft, NT, ND, + BS, no organomegaly Neurological: Pt is alert. At baseline orientation, motor grossly intact Skin: Skin is warm. No rashes, other new lesions, no LE edema Psychiatric: Pt behavior is normal without agitation  .All otherwise neg per pt   Lab Results  Component Value Date   WBC 7.8 07/15/2019   HGB 15.9 07/15/2019   HCT 48.5 07/15/2019   PLT 437.0 (H) 07/15/2019   GLUCOSE 160 (H) 08/30/2019   CHOL 111 08/30/2019   TRIG 126.0 08/30/2019   HDL 33.90 (L) 08/30/2019   LDLCALC 52 08/30/2019   ALT 16 08/30/2019   AST 14 08/30/2019   NA 141 08/30/2019   K 4.7 08/30/2019   CL 103 08/30/2019   CREATININE 1.67 (H) 08/30/2019   BUN 22 08/30/2019   CO2 29 08/30/2019   TSH 1.59 07/15/2019   PSA 1.31 07/15/2019   HGBA1C 8.1 (H) 08/30/2019   MICROALBUR 4.2 (H) 11/11/2018      Assessment & Plan:

## 2019-09-20 NOTE — Patient Instructions (Signed)
You are given the return to work note today  The Short Term Disability forms will be filled out  Please continue all other medications as before, and refills have been done if requested.  Please have the pharmacy call with any other refills you may need.  Please continue your efforts at being more active, low cholesterol diet, and weight control.  Please keep your appointments with your specialists as you may have planned  You will be contacted regarding the referral for: [facility])

## 2019-09-23 ENCOUNTER — Telehealth: Payer: Self-pay | Admitting: Internal Medicine

## 2019-09-23 NOTE — Telephone Encounter (Signed)
Copied from [place] [id number]. Topic: General - Other >> Sep 23, 2019 11:47 AM Antonieta Iba C wrote: Reason for CRM: pt called in to get the status of his disability paperwork. Pt says that he left it with his PCP on Monday at his apt  CB: [phone]

## 2019-09-23 NOTE — Telephone Encounter (Signed)
Not quite ready, hoping to get done today

## 2019-09-24 NOTE — Telephone Encounter (Signed)
Dr. Jenny Reichmann if you need anything let me know.

## 2019-09-29 NOTE — Telephone Encounter (Signed)
LVM to inform patient the forms are ready to be picked up.   Forms have been faxed to [employer] [place] @[phone], Copy sent to scan &Charged for.

## 2019-10-07 ENCOUNTER — Other Ambulatory Visit: Payer: Self-pay | Admitting: Endocrinology

## 2019-10-11 ENCOUNTER — Other Ambulatory Visit: Payer: Self-pay | Admitting: Internal Medicine

## 2019-10-21 ENCOUNTER — Other Ambulatory Visit: Payer: Self-pay | Admitting: Endocrinology

## 2019-10-23 ENCOUNTER — Other Ambulatory Visit: Payer: Self-pay | Admitting: Endocrinology

## 2019-10-25 NOTE — Telephone Encounter (Signed)
Blood pressure is now being managed by PCP, forward prescription

## 2019-10-25 NOTE — Telephone Encounter (Signed)
noted 

## 2019-10-25 NOTE — Telephone Encounter (Signed)
Refill or send to PCP? 

## 2019-11-06 ENCOUNTER — Other Ambulatory Visit: Payer: Self-pay | Admitting: Endocrinology

## 2019-11-11 ENCOUNTER — Other Ambulatory Visit: Payer: Self-pay | Admitting: Endocrinology

## 2019-12-06 ENCOUNTER — Other Ambulatory Visit: Payer: BC Managed Care – PPO

## 2019-12-09 ENCOUNTER — Ambulatory Visit: Payer: BC Managed Care – PPO | Admitting: Endocrinology

## 2019-12-21 ENCOUNTER — Other Ambulatory Visit: Payer: BC Managed Care – PPO

## 2019-12-27 ENCOUNTER — Other Ambulatory Visit: Payer: Self-pay

## 2019-12-27 ENCOUNTER — Other Ambulatory Visit (INDEPENDENT_AMBULATORY_CARE_PROVIDER_SITE_OTHER): Payer: BC Managed Care – PPO

## 2019-12-27 DIAGNOSIS — E1165 Type 2 diabetes mellitus with hyperglycemia: Secondary | ICD-10-CM | POA: Diagnosis not present

## 2019-12-27 DIAGNOSIS — Z794 Long term (current) use of insulin: Secondary | ICD-10-CM | POA: Diagnosis not present

## 2019-12-27 LAB — BASIC METABOLIC PANEL
BUN: 15 mg/dL (ref 6–23)
CO2: 31 mEq/L (ref 19–32)
Calcium: 10.4 mg/dL (ref 8.4–10.5)
Chloride: 101 mEq/L (ref 96–112)
Creatinine, Ser: 1.44 mg/dL (ref 0.40–1.50)
GFR: 62.24 mL/min (ref >60.00)
Glucose, Bld: 162 mg/dL — ABNORMAL HIGH (ref 70–99)
Potassium: 4.2 mEq/L (ref 3.5–5.1)
Sodium: 137 mEq/L (ref 135–145)

## 2019-12-27 LAB — MICROALBUMIN / CREATININE URINE RATIO
Creatinine,U: 136.6 mg/dL
Microalb Creat Ratio: 5.6 mg/g (ref 0.0–30.0)
Microalb, Ur: 7.6 mg/dL — ABNORMAL HIGH (ref 0.0–1.9)

## 2019-12-27 LAB — HEMOGLOBIN A1C: Hgb A1c MFr Bld: 7.8 % — ABNORMAL HIGH (ref 4.6–6.5)

## 2019-12-30 ENCOUNTER — Ambulatory Visit (INDEPENDENT_AMBULATORY_CARE_PROVIDER_SITE_OTHER): Payer: BC Managed Care – PPO | Admitting: Endocrinology

## 2019-12-30 ENCOUNTER — Encounter: Payer: Self-pay | Admitting: Endocrinology

## 2019-12-30 ENCOUNTER — Other Ambulatory Visit: Payer: Self-pay

## 2019-12-30 DIAGNOSIS — Z794 Long term (current) use of insulin: Secondary | ICD-10-CM

## 2019-12-30 DIAGNOSIS — E1165 Type 2 diabetes mellitus with hyperglycemia: Secondary | ICD-10-CM | POA: Diagnosis not present

## 2019-12-30 DIAGNOSIS — N289 Disorder of kidney and ureter, unspecified: Secondary | ICD-10-CM

## 2019-12-30 NOTE — Progress Notes (Signed)
Patient ID: Raymond Ryan, male   DOB: November 10, 1966, 53 y.o.   MRN: [id number]           Reason for Appointment: Follow-up for Type 2 Diabetes  I connected with the above-named patient by video enabled telemedicine application and verified that I am speaking with the correct person. The patient was explained the limitations of evaluation and management by telemedicine and the availability of in person appointments.  Patient also understood that there may be a patient responsible charge related to this service . Location of the patient: Patient's home . Location of the provider: Physician office Only the patient and myself were participating in the encounter The patient understood the above statements and agreed to proceed.   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 .  NovoLog 20 units before meals   Non-insulin hypoglycemic drugs the patient is taking are: Metformin 500 mg bid  daily.  Ozempic 0.5 mg weekly  His A1c is 7.8, was 8.1   Current management, blood sugar patterns and problems identified:  He was advised to take NovoLog regularly before meals on his last visit  He says he is taking mostly 20 units but usually only at suppertime but may not take it if the blood sugar is near normal  He has not done any blood sugars after  evening meals and vehicle to assess his mealtime insulin coverage  However on an average fasting readings are somewhat better on an average  Does take Ozempic regularly every week  He has not checked his weight and not clear if he has lost any  No hypoglycemia currently  Has not done any regular exercise and only has minimal activity with work        Side effects from medications have been: Diarrhea from regular metformin     Typical meal intake: Breakfast is oatmeal or cereal or egg sandwich, lunch sometimes fast food, dinner salad and sandwich                Glucose monitoring:  done 1-2 times a day         Glucometer:  Freestyle neo.       Blood Glucose readings by time of day as below   PRE-MEAL Fasting Lunch Dinner Bedtime Overall  Glucose range:  124-176   114-141    Mean/median:     ?   POST-MEAL PC Breakfast PC Lunch PC Dinner  Glucose range:  200    Mean/median:       Previous readings  FASTING blood sugars range from 82 up to 166  Suppertime range 131-215 Overall glucose average 149  Dietician visit, most recent: Never  Weight history:  Wt Readings from Last 3  Encounters:  09/20/19 284 lb (128.8 kg)  09/02/19 284 lb 6.4 oz (129 kg)  07/15/19 283 lb (128.4 kg)    Glycemic control:   Lab Results  Component Value Date   HGBA1C 7.8 (H) 12/27/2019   HGBA1C 8.1 (H) 08/30/2019   HGBA1C 8.4 (H) 05/10/2019   Lab Results  Component Value Date   MICROALBUR 7.6 (H) 12/27/2019   LDLCALC 52 08/30/2019   CREATININE 1.44 12/27/2019   Lab Results  Component Value Date   MICRALBCREAT 5.6 12/27/2019    Lab Results  Component Value Date   FRUCTOSAMINE 325 (H) 08/30/2019   FRUCTOSAMINE 333 (H) 12/16/2018    Lab on 12/27/2019  Component Date Value Ref Range Status  . Sodium 12/27/2019 137  135 - 145 mEq/L Final  . Potassium 12/27/2019 4.2  3.5 - 5.1 mEq/L Final  . Chloride 12/27/2019 101  96 - 112 mEq/L Final  . CO2 12/27/2019 31  19 - 32 mEq/L Final   . Glucose, Bld 12/27/2019 162* 70 - 99 mg/dL Final  . BUN 12/27/2019 15  6 - 23 mg/dL Final  . Creatinine, Ser 12/27/2019 1.44  0.40 - 1.50 mg/dL Final  . GFR 12/27/2019 62.24  >60.00 mL/min Final  . Calcium 12/27/2019 10.4  8.4 - 10.5 mg/dL Final  . Hgb A1c MFr Bld 12/27/2019 7.8* 4.6 - 6.5 % Final   Glycemic Control Guidelines for People with Diabetes:Non Diabetic:  <6%Goal of Therapy: <7%Additional Action Suggested:  >8%   . Microalb, Ur 12/27/2019 7.6* 0.0 - 1.9 mg/dL Final  . Creatinine,U 12/27/2019 136.6  mg/dL Final  . Microalb Creat Ratio 12/27/2019 5.6  0.0 - 30.0 mg/g Final    Allergies as of 12/30/2019   No Known Allergies     Medication List       Accurate as of December 30, 2019 11:59 PM. If you have any questions, ask your nurse or doctor.        amLODipine 5 MG tablet Commonly known as: NORVASC Take 1 tablet (5 mg total) by mouth daily.   atorvastatin 20 MG tablet Commonly known as: Lipitor Take 1 tablet (20 mg total) by mouth daily.   gabapentin 300 MG capsule Commonly known as: NEURONTIN Take 1 capsule (300 mg total) by mouth at bedtime as needed.   Insulin Pen Needle 31G X 5 MM Misc Use for insulin 4 times a day   Insulin Syringes (Disposable) U-100 0.3 ML Misc Inject 1 each into the skin 3 (three) times daily.   lisinopril-hydrochlorothiazide 20-25 MG tablet Commonly known as: ZESTORETIC Take 1 tablet by mouth daily.   metFORMIN 500 MG tablet Commonly known as: GLUCOPHAGE Take 2 tablets (1,000 mg total) by mouth daily with supper.   NovoLOG FlexPen 100 UNIT/ML FlexPen Generic drug: insulin aspart INJECT UPTO 24 UNITS UNDER THE SKIN BEFORE MEALS   OneTouch Verio Flex System w/Device Kit by Does not apply route.   OneTouch Verio test strip Generic drug: glucose blood USE AS INSTRUCTED TO CHECK BLOOD SUGAR THREE TIMES DAILY..   Ozempic (0.25 or 0.5 MG/DOSE) 2 MG/1.5ML Sopn Generic drug: Semaglutide(0.25 or 0.'5MG'$ /DOS) Inject 0.5 mg into  the skin once a week. What changed: Another medication with the same name was removed. Continue taking this medication, and follow the directions you see here. Changed by: Elayne Snare, MD   Tyler Aas FlexTouch 200 UNIT/ML Sopn Generic drug: Insulin Degludec INJECT 50 UNITS INTO THE SKIN DAILY. INCREASE DOSE TO 54 UNITS IF MORNING SUGARS CONSISTENTLY OVER 140  Allergies: No Known Allergies  Past Medical History:  Diagnosis Date  . Charcot foot due to diabetes mellitus ([place])   . Constipation    sometimes soft, some hard -- does go daily - are on fiber   . Diabetes mellitus   . GERD (gastroesophageal reflux disease)   . Hyperlipidemia   . Hypertension   . Neuromuscular disorder ([place])    neuropathy    Past Surgical History:  Procedure Laterality Date  . UPPER GASTROINTESTINAL ENDOSCOPY  2002   Dr Collene Mares     Family History  Problem Relation Age of Onset  . Diabetes Mellitus II Mother   . CAD Mother   . Prostate cancer Father   . Prostate cancer Maternal Uncle   . Prostate cancer Maternal Uncle   . Colon cancer Neg Hx   . Colon polyps Neg Hx     Social History:  reports that he has never smoked. He has never used smokeless tobacco. He reports that he does not drink alcohol or use drugs.   Review of Systems  Lipid history: Has been treated by PCP with 20 mg atorvastatin with adequate control Last lipid panel:    Lab Results  Component Value Date   CHOL 111 08/30/2019   HDL 33.90 (L) 08/30/2019   LDLCALC 52 08/30/2019   TRIG 126.0 08/30/2019   CHOLHDL 3 08/30/2019           Hypertension: Has been treated with Zestoretic This is followed by nephrologist and PCP   Does not monitor at home  BP Readings from Last 3 Encounters:  09/20/19 136/86  09/02/19 138/90  07/15/19 (!) 146/98    Most recent eye exam was in 2/19  Most recent foot exam: 11/19  He takes gabapentin at night from his PCP for neuropathic pain  Currently known complications of diabetes:  Neuropathy, erectile dysfunction, unknown status of retinopathy  Has a renal dysfunction of unclear etiology with variable labs  Lab Results  Component Value Date   CREATININE 1.44 12/27/2019   CREATININE 1.67 (H) 08/30/2019   CREATININE 1.46 07/15/2019     LABS:  Lab on 12/27/2019  Component Date Value Ref Range Status  . Sodium 12/27/2019 137  135 - 145 mEq/L Final  . Potassium 12/27/2019 4.2  3.5 - 5.1 mEq/L Final  . Chloride 12/27/2019 101  96 - 112 mEq/L Final  . CO2 12/27/2019 31  19 - 32 mEq/L Final  . Glucose, Bld 12/27/2019 162* 70 - 99 mg/dL Final  . BUN 12/27/2019 15  6 - 23 mg/dL Final  . Creatinine, Ser 12/27/2019 1.44  0.40 - 1.50 mg/dL Final  . GFR 12/27/2019 62.24  >60.00 mL/min Final  . Calcium 12/27/2019 10.4  8.4 - 10.5 mg/dL Final  . Hgb A1c MFr Bld 12/27/2019 7.8* 4.6 - 6.5 % Final   Glycemic Control Guidelines for People with Diabetes:Non Diabetic:  <6%Goal of Therapy: <7%Additional Action Suggested:  >8%   . Microalb, Ur 12/27/2019 7.6* 0.0 - 1.9 mg/dL Final  . Creatinine,U 12/27/2019 136.6  mg/dL Final  . Microalb Creat Ratio 12/27/2019 5.6  0.0 - 30.0 mg/g Final    Physical Examination:  There were no vitals taken for this visit.    ASSESSMENT:  Diabetes type 2 with BMI 42  See history of present illness for detailed discussion of current diabetes management, blood sugar patterns and problems identified  His A1c is slightly better at 7.8  He is on basal bolus insulin and Ozempic Still appears  to be needing mealtime insulin although he has only few readings after meals to assess the need and adjust the dose Highest blood sugar has been 200 but he has only 1 reading after breakfast likely after eating cereal He has not done any significant exercise recently despite reminders  Fasting readings may be as high as 176 and likely when he does not cover his evening meal or has relatively high fat meal in the evening    HYPERTENSION: Followed by  nephrologist and PCP  Renal dysfunction: 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  Encouraged him to start walking at least every other day for 30 minutes  3. He will go up to 52 units on the Tresiba and if morning sugars are still over 130 he will go up to 54 If his blood sugars after dinner are mostly over 180 he will need to take up to 24 units of NovoLog instead of 20 Also to take NovoLog at breakfast especially if eating cereal or oatmeal To take insulin with him at lunchtime to cover those meals consistently  Counseling time on subjects discussed in assessment and plan sections is over 50% of today's 25 minute visit    There are no Patient Instructions on file for this visit.    Elayne Snare 12/31/2019, 2:13 PM   Note: This office note was prepared with Dragon voice recognition system technology. Any transcriptional errors that result from this process are unintentional.

## 2020-01-14 ENCOUNTER — Other Ambulatory Visit: Payer: Self-pay | Admitting: Endocrinology

## 2020-01-18 ENCOUNTER — Other Ambulatory Visit: Payer: Self-pay | Admitting: Surgery

## 2020-01-18 ENCOUNTER — Other Ambulatory Visit (HOSPITAL_COMMUNITY): Payer: Self-pay | Admitting: Surgery

## 2020-01-19 ENCOUNTER — Encounter: Payer: Self-pay | Admitting: Internal Medicine

## 2020-01-19 ENCOUNTER — Ambulatory Visit (INDEPENDENT_AMBULATORY_CARE_PROVIDER_SITE_OTHER): Payer: BC Managed Care – PPO | Admitting: Internal Medicine

## 2020-01-19 ENCOUNTER — Other Ambulatory Visit: Payer: Self-pay

## 2020-01-19 VITALS — BP 132/84 | HR 114 | Temp 98.5°F | Ht 75.0 in | Wt 288.0 lb

## 2020-01-19 DIAGNOSIS — E785 Hyperlipidemia, unspecified: Secondary | ICD-10-CM | POA: Diagnosis not present

## 2020-01-19 DIAGNOSIS — I10 Essential (primary) hypertension: Secondary | ICD-10-CM | POA: Diagnosis not present

## 2020-01-19 DIAGNOSIS — N183 Chronic kidney disease, stage 3 unspecified: Secondary | ICD-10-CM | POA: Diagnosis not present

## 2020-01-19 DIAGNOSIS — M14671 Charcot's joint, right ankle and foot: Secondary | ICD-10-CM

## 2020-01-19 DIAGNOSIS — E1122 Type 2 diabetes mellitus with diabetic chronic kidney disease: Secondary | ICD-10-CM

## 2020-01-19 DIAGNOSIS — N182 Chronic kidney disease, stage 2 (mild): Secondary | ICD-10-CM

## 2020-01-19 MED ORDER — LISINOPRIL-HYDROCHLOROTHIAZIDE 20-25 MG PO TABS: 1.0000 | ORAL_TABLET | Freq: Every day | ORAL | 3 refills | Status: DC

## 2020-01-19 MED ORDER — AMLODIPINE BESYLATE 5 MG PO TABS: 5.0000 mg | ORAL_TABLET | Freq: Every day | ORAL | 3 refills | Status: DC

## 2020-01-19 NOTE — Assessment & Plan Note (Signed)
stable overall by history and exam, recent data reviewed with pt, and pt to continue medical treatment as before,  to f/u any worsening symptoms or concerns  

## 2020-01-19 NOTE — Assessment & Plan Note (Signed)
For letter of necessity, f/u bariatric surgury

## 2020-01-19 NOTE — Progress Notes (Signed)
Subjective:    Patient ID: Raymond Ryan, male    DOB: 1966-05-20, 54 y.o.   MRN: [id number]  HPI  Here to f/u with persistent obesity, now working with bariatric surgury, has 2/10 UGI and ECG, plans to see psychology and dietician, but also needs letter of necessity. Peak wt has been 305 per pt in the past with improved wt now plataued in the past 3 mo at 284-288 it seem unable to lose further.  Wt Readings from Last 3 Encounters:  01/19/20 288 lb (130.6 kg)  09/20/19 284 lb (128.8 kg)  09/02/19 284 lb 6.4 oz (129 kg)  No able to exercise with wt bearing due to charcot foot, and advised by ortho for wt loss and avoid prolonged standing and walking on regular basis as this will only flatten and deform the foot further; pt has tried non wt bearing exercise such as indoor exercise bike with some initial success but no longer able to lose, and not able to engage as well currently due to pandemic and gym is closed.   Did lose wt also with [place] for 1 yr 2019 but unable now to lose further.  Has not had further success with reduced calorie DM diet in 2020 as well.   Past Medical History:  Diagnosis Date  . Charcot foot due to diabetes mellitus ([place])   . Constipation    sometimes soft, some hard -- does go daily - are on fiber   . Diabetes mellitus   . GERD (gastroesophageal reflux disease)   . Hyperlipidemia   . Hypertension   . Neuromuscular disorder ([place])    neuropathy   Past Surgical History:  Procedure Laterality Date  . UPPER GASTROINTESTINAL ENDOSCOPY  2002   Dr Collene Mares     reports that he has never smoked. He has never used smokeless tobacco. He reports that he does not drink alcohol or use drugs. family history includes CAD in his mother; Diabetes Mellitus II in his mother; Prostate cancer in his father, maternal uncle, and maternal uncle. No Known Allergies Current Outpatient Medications on File Prior to Visit  Medication Sig Dispense Refill  . atorvastatin (LIPITOR)  20 MG tablet Take 1 tablet (20 mg total) by mouth daily. 90 tablet 3  . Blood Glucose Monitoring Suppl (ONETOUCH VERIO FLEX SYSTEM) w/Device KIT by Does not apply route.    . gabapentin (NEURONTIN) 300 MG capsule Take 1 capsule (300 mg total) by mouth at bedtime as needed. 90 capsule 1  . Insulin Pen Needle 31G X 5 MM MISC Use for insulin 4 times a day 100 each 1  . Insulin Syringes, Disposable, U-100 0.3 ML MISC Inject 1 each into the skin 3 (three) times daily. 100 each 11  . metFORMIN (GLUCOPHAGE) 500 MG tablet Take 2 tablets (1,000 mg total) by mouth daily with supper. 180 tablet 3  . NOVOLOG FLEXPEN 100 UNIT/ML FlexPen INJECT UPTO 24 UNITS UNDER THE SKIN BEFORE MEALS 30 mL 1  . ONETOUCH VERIO test strip USE AS INSTRUCTED TO CHECK BLOOD SUGAR THREE TIMES DAILY.. 100 strip 3  . Semaglutide,0.25 or 0.5MG/DOS, (OZEMPIC, 0.25 OR 0.5 MG/DOSE,) 2 MG/1.5ML SOPN Inject 0.5 mg into the skin once a week. 1.5 mL 1  . TRESIBA FLEXTOUCH 200 UNIT/ML SOPN INJECT 50 UNITS INTO THE SKIN DAILY. INCREASE DOSE TO 54 UNITS IF MORNING SUGARS CONSISTENTLY OVER 140 9 pen 2   No current facility-administered medications on file prior to visit.   Review of Systems  All otherwise neg per pt     Objective:   Physical Exam BP 132/84   Pulse (!) 114   Temp 98.5 F (36.9 C)   Ht _0  (1.905 m)   Wt 288 lb (130.6 kg)   SpO2 99%   BMI 36.00 kg/m  VS noted,  Constitutional: Pt appears in NAD HENT: Head: NCAT.  Right Ear: External ear normal.  Left Ear: External ear normal.  Eyes: . Pupils are equal, round, and reactive to light. Conjunctivae and EOM are normal Nose: without d/c or deformity Neck: Neck supple. Gross normal ROM Cardiovascular: Normal rate and regular rhythm.   Pulmonary/Chest: Effort normal and breath sounds without rales or wheezing.  Abd:  Soft, NT, ND, + BS, no organomegaly Neurological: Pt is alert. At baseline orientation, motor grossly intact Skin: Skin is warm. No rashes, other new  lesions, no LE edema Psychiatric: Pt behavior is normal without agitation  All otherwise neg per pt Lab Results  Component Value Date   WBC 7.8 07/15/2019   HGB 15.9 07/15/2019   HCT 48.5 07/15/2019   PLT 437.0 (H) 07/15/2019   GLUCOSE 162 (H) 12/27/2019   CHOL 111 08/30/2019   TRIG 126.0 08/30/2019   HDL 33.90 (L) 08/30/2019   LDLCALC 52 08/30/2019   ALT 16 08/30/2019   AST 14 08/30/2019   NA 137 12/27/2019   K 4.2 12/27/2019   CL 101 12/27/2019   CREATININE 1.44 12/27/2019   BUN 15 12/27/2019   CO2 31 12/27/2019   TSH 1.59 07/15/2019   PSA 1.31 07/15/2019   HGBA1C 7.8 (H) 12/27/2019   MICROALBUR 7.6 (H) 12/27/2019       Assessment & Plan:

## 2020-01-19 NOTE — Assessment & Plan Note (Signed)
Continue f/u ortho as planned

## 2020-01-19 NOTE — Patient Instructions (Signed)
Please continue all other medications as before, and refills have been done if requested.  Please have the pharmacy call with any other refills you may need.  Please continue your efforts at being more active, low cholesterol diet, and weight control.  Please keep your appointments with your specialists as you may have planned  Your letter and form will be faxed soon to the [facility]  Please make an Appointment to return in 6 months, or sooner if needed

## 2020-01-19 NOTE — Assessment & Plan Note (Addendum)
stable overall by history and exam, recent data reviewed with pt, and pt to continue medical treatment as before,  to f/u any worsening symptoms or concerns  I spent 35 minutes preparing to see the patient by review of recent labs, imaging and procedures, obtaining and reviewing separately obtained history, communicating with the patient and family or caregiver, ordering medications, tests or procedures, and documenting clinical information in the EHR including the differential Dx, treatment, and any further evaluation and other management of dm, HLD, HTN, CKD, obesity, Charcot joint

## 2020-01-19 NOTE — Assessment & Plan Note (Signed)
stable overall by history and exam, recent data reviewed with pt, and pt to continue medical treatment as before,  to f/u any worsening sym.stable

## 2020-01-25 ENCOUNTER — Encounter: Payer: Self-pay | Admitting: Internal Medicine

## 2020-01-26 ENCOUNTER — Ambulatory Visit (HOSPITAL_COMMUNITY)
Admission: RE | Admit: 2020-01-26 | Discharge: 2020-01-26 | Disposition: A | Discharge status: Home / Self Care | Payer: BC Managed Care – PPO | Source: Ambulatory Visit | Attending: Surgery | Admitting: Surgery

## 2020-01-26 ENCOUNTER — Other Ambulatory Visit: Payer: Self-pay

## 2020-01-29 ENCOUNTER — Encounter: Payer: Self-pay | Admitting: Internal Medicine

## 2020-02-16 ENCOUNTER — Encounter: Payer: BC Managed Care – PPO | Attending: Surgery | Admitting: Dietician

## 2020-02-16 ENCOUNTER — Other Ambulatory Visit: Payer: Self-pay

## 2020-02-16 ENCOUNTER — Encounter: Payer: Self-pay | Admitting: Dietician

## 2020-02-16 DIAGNOSIS — E1122 Type 2 diabetes mellitus with diabetic chronic kidney disease: Secondary | ICD-10-CM | POA: Diagnosis not present

## 2020-02-16 DIAGNOSIS — N182 Chronic kidney disease, stage 2 (mild): Secondary | ICD-10-CM | POA: Diagnosis present

## 2020-02-16 NOTE — Progress Notes (Signed)
Nutrition Assessment for Bariatric Surgery Medical Nutrition Therapy   Patient was seen on 02/16/2020 for Pre-Operative Nutrition Assessment. Letter of approval faxed to [facility] bariatric surgery program coordinator on 02/16/2020.   Referral stated Supervised Weight Loss (SWL) visits needed: 0  Planned surgery: Sleeve  Pt expectation of surgery: to come off medications and help control blood sugar   NUTRITION ASSESSMENT   Anthropometrics  Start weight at [facility]: 286.9 lbs (date: 02/16/2020) Height: 75 in BMI: 35.9 kg/m2    Blood Glucose (pt reported)  Fasting: 107 Post-Prandial: 167    Lifestyle & Dietary Hx Works as a Teacher, early years/pre. Drinks 4-5 bottles of water a day. Typical meal pattern is 2-3 meals per day plus several snacks.   24-Hr Dietary Recall First Meal: oatmeal (or 2 eggs + 2 pieces toast)  Snack: pretzels (or crackers) Second Meal: sandwich Snack: Glucerna shake  Third Meal: meat + vegetable  Snack: granola bar  Beverages: water    NUTRITION DIAGNOSIS  Overweight/obesity ([place]-3.3) related to past poor dietary habits and physical inactivity as evidenced by patient w/ planned Sleeve Gastrectomy surgery following dietary guidelines for continued weight loss.    NUTRITION INTERVENTION  Nutrition counseling (C-1) and education (E-2) to facilitate bariatric surgery goals.  Pre-Op Goals Reviewed with the Patient . Track food and beverage intake (pen and paper, MyFitness Pal, Baritastic app, etc.) . Make healthy food choices while monitoring portion sizes . Consume 3 meals per day or try to eat every 3-5 hours . Avoid concentrated sugars and fried foods . Keep sugar & fat in the single digits per serving on food labels . Practice CHEWING your food (aim for applesauce consistency) . Practice not drinking 15 minutes before, during, and 30 minutes after each meal and snack . Avoid all carbonated beverages (ex: soda, sparkling beverages)  . Limit  caffeinated beverages (ex: coffee, tea, energy drinks) . Avoid all sugar-sweetened beverages (ex: regular soda, sports drinks)  . Avoid alcohol  . Aim for 64-100 ounces of FLUID daily (with at least half of fluid intake being plain water)  . Aim for at least 60-80 grams of PROTEIN daily . Look for a liquid protein source that contains ?15 g protein and ?5 g carbohydrate (ex: shakes, drinks, shots) . Make a list of non-food related activities . Physical activity is an important part of a healthy lifestyle so keep it moving! The goal is to reach 150 minutes of exercise per week, including cardiovascular and weight baring activity.  Handouts Provided Include  . Bariatric Surgery handouts (Nutrition Visits, Pre-Op Goals, Protein Shakes, Vitamins & Minerals)  Learning Style & Readiness for Change Teaching method utilized: Visual & Auditory  Demonstrated degree of understanding via: Teach Back  Barriers to learning/adherence to lifestyle change: None Identified    MONITORING & EVALUATION Dietary intake, weekly physical activity, body weight, and pre-op goals reached at next nutrition visit.   Next Steps Patient is to follow up at [facility] for Pre-Op Class (>2 weeks before surgery) for further nutrition education.

## 2020-03-28 ENCOUNTER — Other Ambulatory Visit: Payer: Self-pay | Admitting: Endocrinology

## 2020-04-13 ENCOUNTER — Telehealth: Payer: Self-pay | Admitting: Internal Medicine

## 2020-04-13 NOTE — Telephone Encounter (Signed)
Patient has dropped off a insulin treated DM assessment form. His Endo doctor use to fill out the forms but he is longer going over there. Dr.John has been treating his DM. Patient aware Dr.John is out of office and will not return until next week.   Forms have been completed &Placed in providers box to review and sign.

## 2020-04-17 ENCOUNTER — Other Ambulatory Visit: Payer: Self-pay | Admitting: Endocrinology

## 2020-04-17 DIAGNOSIS — Z0279 Encounter for issue of other medical certificate: Secondary | ICD-10-CM

## 2020-04-17 NOTE — Telephone Encounter (Signed)
Forms have been signed, Faxed to [facility] @ [phone], Copy sent to scan &Charged for.   Patient informed and will pick up original.

## 2020-04-19 ENCOUNTER — Other Ambulatory Visit: Payer: Self-pay | Admitting: Endocrinology

## 2020-06-07 ENCOUNTER — Ambulatory Visit (INDEPENDENT_AMBULATORY_CARE_PROVIDER_SITE_OTHER): Payer: BC Managed Care – PPO | Admitting: Physician Assistant

## 2020-06-07 ENCOUNTER — Encounter: Payer: Self-pay | Admitting: Physician Assistant

## 2020-06-07 ENCOUNTER — Other Ambulatory Visit: Payer: Self-pay

## 2020-06-07 VITALS — Ht 75.0 in | Wt 286.0 lb

## 2020-06-07 DIAGNOSIS — Z89431 Acquired absence of right foot: Secondary | ICD-10-CM

## 2020-06-07 NOTE — Progress Notes (Signed)
Office Visit Note   Patient: Raymond Ryan           Date of Birth: 03-Mar-1966           MRN: [id number] Visit Date: 06/07/2020              Requested by: Biagio Borg, MD [address] [place],  [place] [address] PCP: Biagio Borg, MD  Chief Complaint  Patient presents with  . Right Foot - Follow-up      HPI: The patient is a 54 year old gentleman who presents for evaluation of his right foot he status post right transmetatarsal amputation he is currently working with biotech for his orthotic and spacer needs he has not had any worsening of medical issues he simply needs a new insert for her shoe to be fabricated.  Assessment & Plan: Visit Diagnoses:  1. History of transmetatarsal amputation of right foot ([place])     Plan: Given an order for new custom orthotic with a carbon fiber plate and spacer he will follow-up in the office as needed  Follow-Up Instructions: Return if symptoms worsen or fail to improve.   Ortho Exam  Patient is alert, oriented, no adenopathy, well-dressed, normal affect, normal respiratory effort. On examination the right foot his transmetatarsal amputation is well-healed there is no callus there is no impending breakdown no swelling no sign of infection  Imaging: No results found. No images are attached to the encounter.  Labs: Lab Results  Component Value Date   HGBA1C 7.8 (H) 12/27/2019   HGBA1C 8.1 (H) 08/30/2019   HGBA1C 8.4 (H) 05/10/2019   LABURIC 7.0 11/02/2012   REPTSTATUS 07/19/2011 FINAL 07/18/2011   CULT NO GROWTH 07/18/2011     Lab Results  Component Value Date   ALBUMIN 4.4 08/30/2019   ALBUMIN 4.5 07/15/2019   ALBUMIN 4.5 05/10/2019   LABURIC 7.0 11/02/2012    No results found for: MG Lab Results  Component Value Date   VD25OH 43.39 07/15/2019    No results found for: PREALBUMIN CBC EXTENDED Latest Ref Rng & Units 07/15/2019 12/01/2017 01/22/2017  WBC 4.0 - 10.5 K/uL 7.8 6.9 7.0  RBC 4.22 - 5.81 Mil/uL 5.64  5.67 5.30  HGB 13.0 - 17.0 g/dL 15.9 16.2 15.5  HCT 39.0 - 52.0 % 48.5 47.7 45.8  PLT 150.0 - 400.0 K/uL 437.0(H) 410(H) 383  NEUTROABS 1.4 - 7.7 K/uL 5.3 4.6 3,990  LYMPHSABS 0.7 - 4.0 K/uL 1.7 1.7 2,310     Body mass index is 35.75 kg/m.  Orders:  No orders of the defined types were placed in this encounter.  No orders of the defined types were placed in this encounter.    Procedures: No procedures performed  Clinical Data: No additional findings.  ROS:  All other systems negative, except as noted in the HPI. Review of Systems  Objective: Vital Signs: Ht 6\' 3"  (1.905 m)   Wt 286 lb (129.7 kg)   BMI 35.75 kg/m   Specialty Comments:  No specialty comments available.  PMFS History: Patient Active Problem List   Diagnosis Date Noted  . Obesity, Class III, BMI 40-49.9 (morbid obesity) ([place]) 09/20/2019  . CKD (chronic kidney disease) stage 3, GFR 30-59 ml/min 07/18/2019  . Preventative health care 07/15/2019  . Diabetic 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 ([place]) 03/01/2019  . Uncontrolled type 2 diabetes mellitus with hyperglycemia, with long-term current use of insulin ([place]) 03/01/2019  . Hyperlipidemia 04/24/2016  . Traumatic subungual hematoma  of toe of right foot 07/12/2015  . Unspecified constipation 07/27/2014  . Type 2 diabetes mellitus with stage 2 chronic kidney disease, without long-term current use of insulin ([place]) 06/25/2013  . Charcot's joint of right foot 06/25/2013  . Diabetic neuropathy ([place]) 06/25/2013  . HTN (hypertension) 11/01/2012   Past Medical History:  Diagnosis Date  . Charcot foot due to diabetes mellitus ([place])   . Constipation    sometimes soft, some hard -- does go daily - are on fiber   . Diabetes mellitus   . GERD (gastroesophageal reflux disease)   . Hyperlipidemia   . Hypertension   . Neuromuscular disorder ([facility])    neuropathy    Family History  Problem Relation Age of Onset  . Diabetes Mellitus  II Mother   . CAD Mother   . Prostate cancer Father   . Prostate cancer Maternal Uncle   . Prostate cancer Maternal Uncle   . Colon cancer Neg Hx   . Colon polyps Neg Hx     Past Surgical History:  Procedure Laterality Date  . UPPER GASTROINTESTINAL ENDOSCOPY  2002   Dr Collene Mares    Social History   Occupational History  . Not on file  Tobacco Use  . Smoking status: Never Smoker  . Smokeless tobacco: Never Used  Substance and Sexual Activity  . Alcohol use: No  . Drug use: No  . Sexual activity: Not on file

## 2020-07-04 ENCOUNTER — Other Ambulatory Visit: Payer: Self-pay | Admitting: Endocrinology

## 2020-07-15 ENCOUNTER — Other Ambulatory Visit: Payer: Self-pay | Admitting: Internal Medicine

## 2020-07-15 IMAGING — US US RENAL
1 series · 14 of 25 positions shown · non-contrast
Comparison: None.

CLINICAL DATA: 51-year-old male with stage 3 chronic kidney
disease.

EXAM:
RENAL / URINARY TRACT ULTRASOUND COMPLETE

[Series 1: us renal · 0.22mm/px · 14 of 37 slices shown]
[im 1/37]
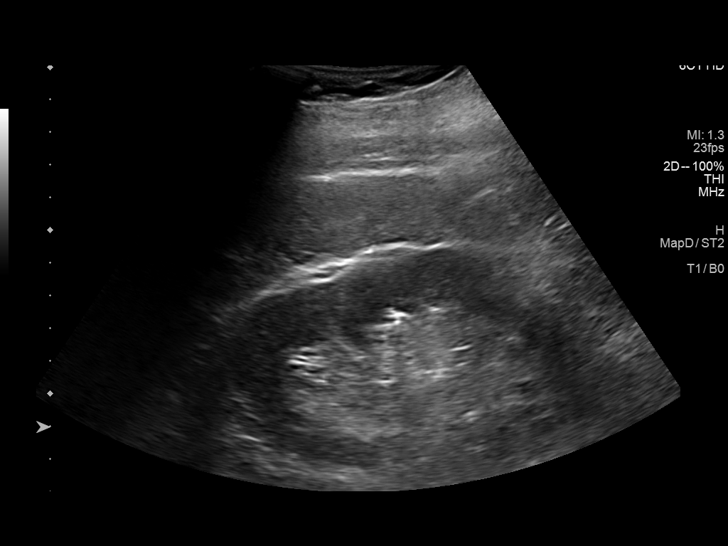
[im 4/37]
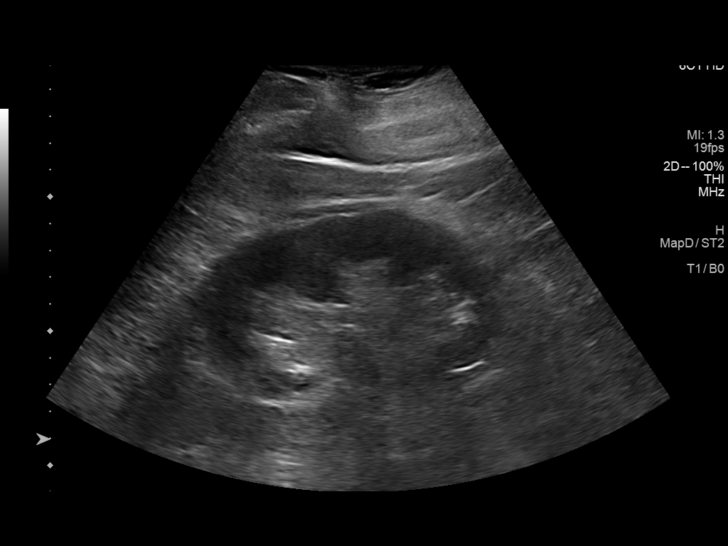
[im 7/37]
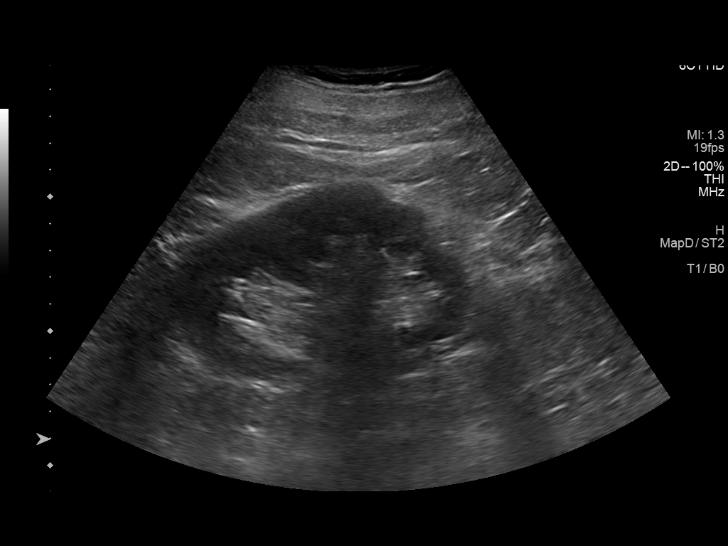
[im 10/37]
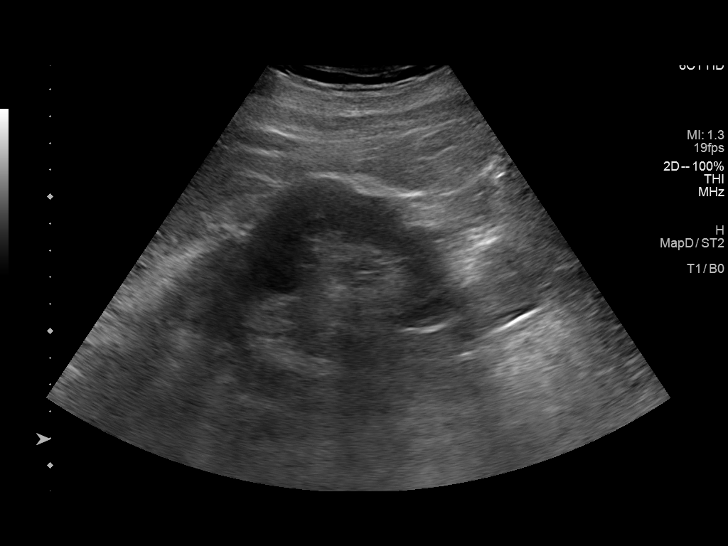
[im 13/37]
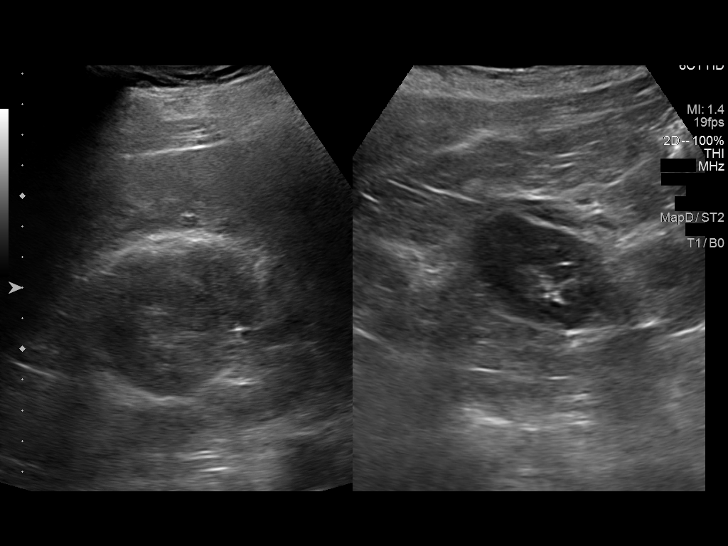
[im 14/37]
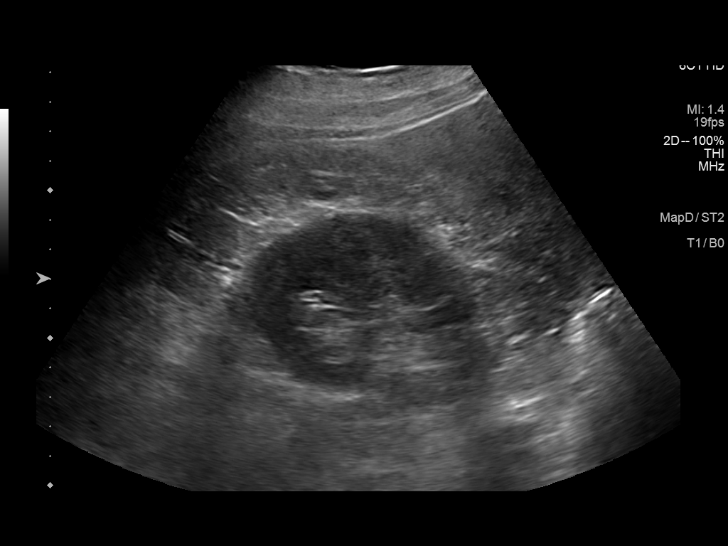
[im 17/37]
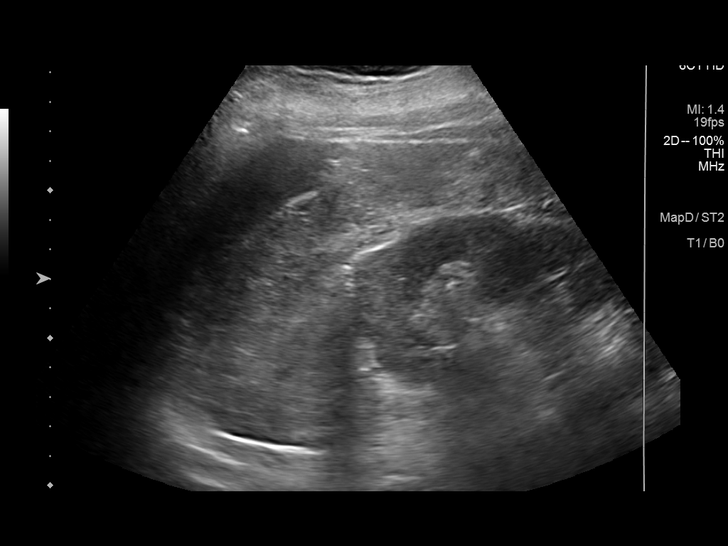
[im 20/37]
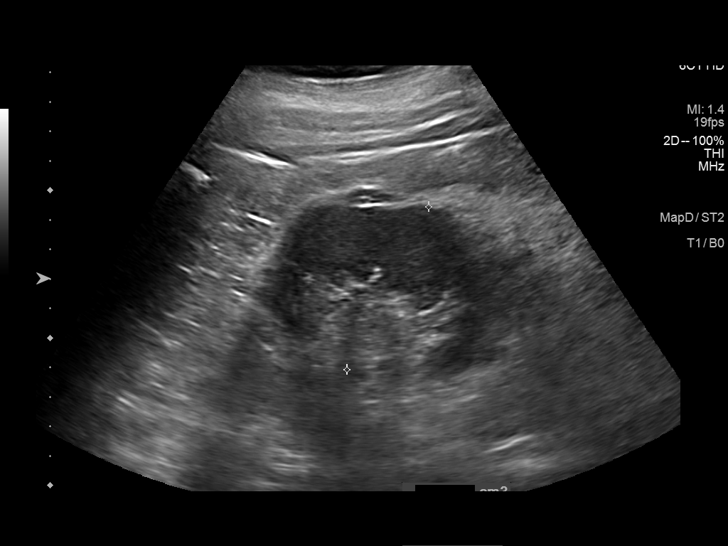
[im 23/37]
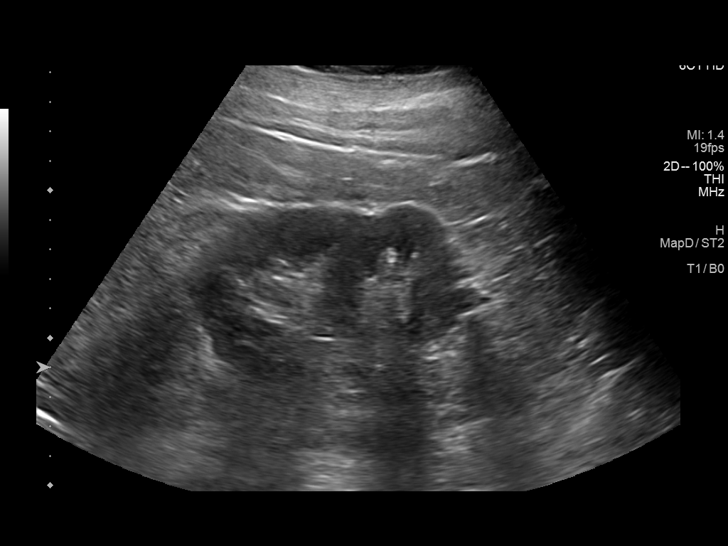
[im 25/37]
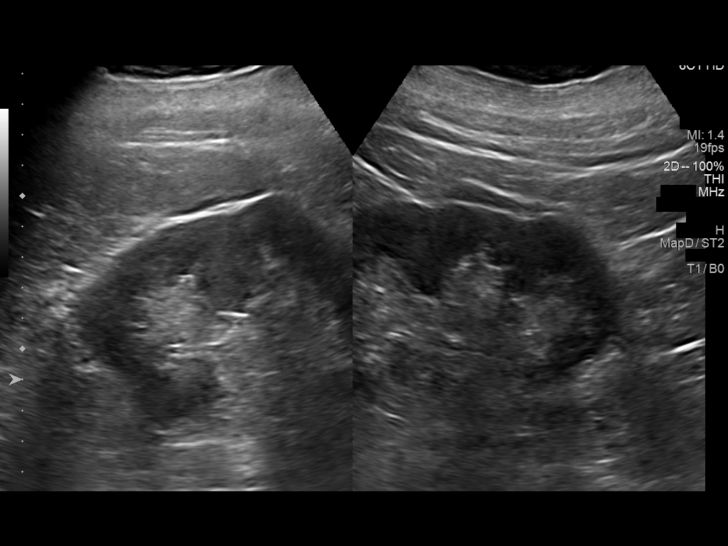
[im 28/37]
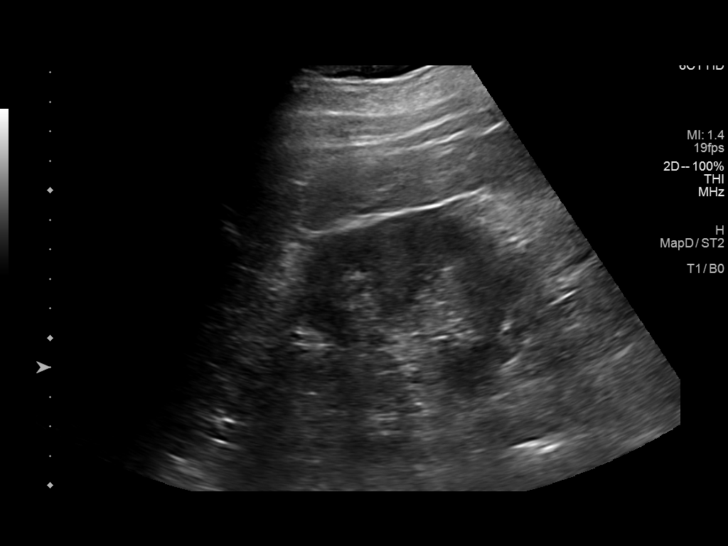
[im 31/37]
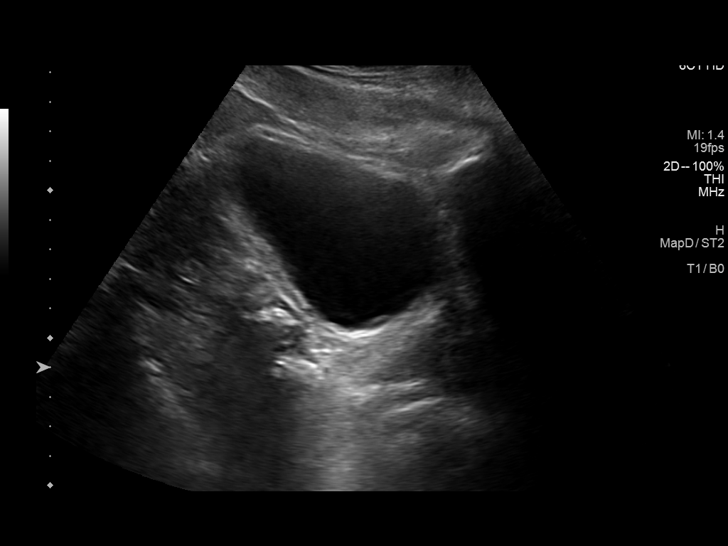
[im 34/37]
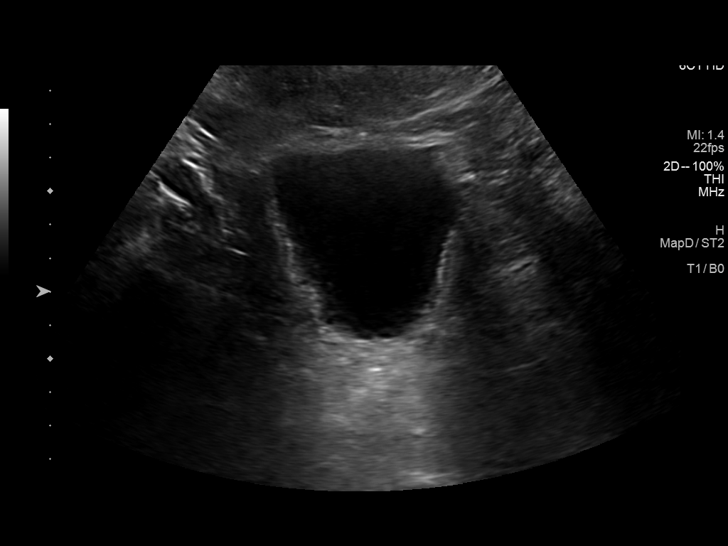
[im 37/37]
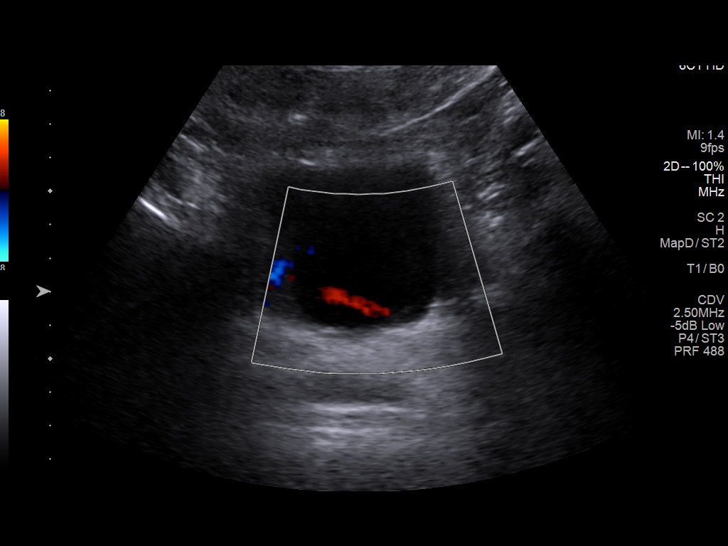

[14 of 25 positions shown; findings below may reference images not displayed]

FINDINGS: Right Kidney:

Renal measurements: 11.9 x 6.4 x 6.1 cm = volume: 242 mL. Normal
echogenicity. No hydronephrosis or shadowing stone.

Left Kidney:

Renal measurements: 11.1 x 6.1 x 6.2 cm = volume: 220 mL. Normal
echogenicity. No hydronephrosis or shadowing stone.

Bladder:

Appears normal for degree of bladder distention.
IMPRESSION: Unremarkable renal ultrasound.

## 2020-07-15 NOTE — Telephone Encounter (Signed)
Please refill as per office routine med refill policy (all routine meds refilled for 3 mo or monthly per pt preference up to one year from last visit, then month to month grace period for 3 mo, then further med refills will have to be denied)  

## 2020-08-25 ENCOUNTER — Ambulatory Visit: Payer: Self-pay | Admitting: Surgery

## 2020-08-25 NOTE — H&P (Signed)
Chief Complaint:  Morbid obesity  History of Present Illness:  Raymond Ryan is an 54 y.o. male.  Raymond Ryan is set up for his sleeve gastrectomy on September 7.  He has a BMI of 37 along with diabetes.  We have discussed a Roux-en-Y gastric bypass and a sleeve and he would prefer the sleeve gastrectomy.  I think is reasonable and we can help him lose weight and hopefully his diabetes will respond to that.  He has a right Charcot foot.  He said diabetes since 2002.  He has GERD. Plan laparoscopic sleeve gastrectomy on Tuesday, September 7.   Past Medical History:  Diagnosis Date  . Charcot foot due to diabetes mellitus ([place])   . Constipation    sometimes soft, some hard -- does go daily - are on fiber   . Diabetes mellitus   . GERD (gastroesophageal reflux disease)   . Hyperlipidemia   . Hypertension   . Neuromuscular disorder ([place])    neuropathy    Past Surgical History:  Procedure Laterality Date  . UPPER GASTROINTESTINAL ENDOSCOPY  2002   Dr Collene Mares     No current facility-administered medications for this encounter.   Current Outpatient Medications  Medication Sig Dispense Refill  . acetaminophen (TYLENOL) 500 MG tablet Take 1,000 mg by mouth every 6 (six) hours as needed (FOR PAIN.).    Marland Kitchen amLODipine (NORVASC) 5 MG tablet Take 1 tablet (5 mg total) by mouth daily. 90 tablet 3  . gabapentin (NEURONTIN) 300 MG capsule Take 1 capsule (300 mg total) by mouth at bedtime as needed. (Patient taking differently: Take 300 mg by mouth daily as needed (pain.). ) 90 capsule 1  . lisinopril-hydrochlorothiazide (ZESTORETIC) 20-25 MG tablet Take 1 tablet by mouth daily. 90 tablet 3  . metFORMIN (GLUCOPHAGE) 500 MG tablet TAKE 2 TABLETS (1,000 MG TOTAL) BY MOUTH DAILY WITH SUPPER. (Patient taking differently: Take 500 mg by mouth in the morning and at bedtime. ) 180 tablet 3  . Multiple Vitamin (MULTIVITAMIN WITH MINERALS) TABS tablet Take 1 tablet by mouth daily.    Marland Kitchen NOVOLOG FLEXPEN 100  UNIT/ML FlexPen INJECT UP TO 24 UNITS UNDER THE SKIN BEFORE MEALS **NEEDS APPT** (Patient taking differently: Inject 30 Units into the skin daily before supper. ) 30 mL 1  . TRESIBA FLEXTOUCH 200 UNIT/ML SOPN INJECT 50 UNITS INTO THE SKIN DAILY. INCREASE DOSE TO 54 UNITS IF MORNING SUGARS CONSISTENTLY OVER 140 (Patient taking differently: Inject 60 Units into the skin at bedtime. ) 9 pen 2  . Blood Glucose Monitoring Suppl (ONETOUCH VERIO FLEX SYSTEM) w/Device KIT by Does not apply route.    . Insulin Pen Needle 31G X 5 MM MISC Use for insulin 4 times a day 100 each 1  . Insulin Syringes, Disposable, U-100 0.3 ML MISC Inject 1 each into the skin 3 (three) times daily. 100 each 11  . ONETOUCH VERIO test strip USE AS INSTRUCTED TO CHECK BLOOD SUGAR THREE TIMES DAILY.. 100 strip 3  . OZEMPIC, 0.25 OR 0.5 MG/DOSE, 2 MG/1.5ML SOPN INJECT 0.5 MG INTO THE SKIN ONCE A WEEK. 1.5 mL 1   Patient has no known allergies. Family History  Problem Relation Age of Onset  . Diabetes Mellitus II Mother   . CAD Mother   . Prostate cancer Father   . Prostate cancer Maternal Uncle   . Prostate cancer Maternal Uncle   . Colon cancer Neg Hx   . Colon polyps Neg Hx    Social  History:   reports that he has never smoked. He has never used smokeless tobacco. He reports that he does not drink alcohol and does not use drugs.   REVIEW OF SYSTEMS : Negative except for see problem list  Physical Exam:   There were no vitals taken for this visit. There is no height or weight on file to calculate BMI.  Gen:  WDWN AAM NAD  Neurological: Alert and oriented to person, place, and time. Motor and sensory function is grossly intact  Head: Normocephalic and atraumatic.  Eyes: Conjunctivae are normal. Pupils are equal, round, and reactive to light. No scleral icterus.  Neck: Normal range of motion. Neck supple. No tracheal deviation or thyromegaly present.  Cardiovascular:  SR without murmurs or gallops.  No carotid  bruits Breast:  Not examined Respiratory: Effort normal.  No respiratory distress. No chest wall tenderness. Breath sounds normal.  No wheezes, rales or rhonchi.  Abdomen:  nontender GU:  Not examine Musculoskeletal: Normal range of motion. Extremities are nontender. No cyanosis, edema or clubbing noted Lymphadenopathy: No cervical, preauricular, postauricular or axillary adenopathy is present Skin: Skin is warm and dry. No rash noted. No diaphoresis. No erythema. No pallor. Pscyh: Normal mood and affect. Behavior is normal. Judgment and thought content normal.   LABORATORY RESULTS: No results found for this or any previous visit (from the past 48 hour(s)).   RADIOLOGY RESULTS: No results found.  Problem List: Patient Active Problem List   Diagnosis Date Noted  . Obesity, Class III, BMI 40-49.9 (morbid obesity) ([place]) 09/20/2019  . CKD (chronic kidney disease) stage 3, GFR 30-59 ml/min 07/18/2019  . Preventative health care 07/15/2019  . Diabetic 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 ([place]) 03/01/2019  . Uncontrolled type 2 diabetes mellitus with hyperglycemia, with long-term current use of insulin ([place]) 03/01/2019  . Hyperlipidemia 04/24/2016  . Traumatic subungual hematoma of toe of right foot 07/12/2015  . Unspecified constipation 07/27/2014  . Type 2 diabetes mellitus with stage 2 chronic kidney disease, without long-term current use of insulin ([place]) 06/25/2013  . Charcot's joint of right foot 06/25/2013  . Diabetic neuropathy ([place]) 06/25/2013  . HTN (hypertension) 11/01/2012    Assessment & Plan: Morbid obesity.  This case was not done on Sept 7 and this pended note is completed only for medical records.      Matt B. Hassell Done, MD, [facility] [phone] beeper [phone]  08/25/2020 2:02 PM

## 2020-08-28 ENCOUNTER — Other Ambulatory Visit: Payer: Self-pay

## 2020-08-28 ENCOUNTER — Encounter: Payer: BC Managed Care – PPO | Attending: Surgery | Admitting: Skilled Nursing Facility1

## 2020-08-28 DIAGNOSIS — N183 Chronic kidney disease, stage 3 unspecified: Secondary | ICD-10-CM | POA: Diagnosis not present

## 2020-08-29 ENCOUNTER — Telehealth: Payer: Self-pay | Admitting: Internal Medicine

## 2020-08-29 ENCOUNTER — Telehealth: Payer: Self-pay | Admitting: Endocrinology

## 2020-08-29 NOTE — Telephone Encounter (Signed)
Have not seen him since last December.  We can either work him in for a follow-up visit without labs or he will have to talk to his surgeon

## 2020-08-29 NOTE — Telephone Encounter (Signed)
Please advise 

## 2020-08-29 NOTE — Telephone Encounter (Signed)
Patient called saying he was trying to get "weight loss surgery" last year, but he just now was approved for it and is scheduled to have it done September 7th. Patient wanted to ask Dr Dwyane Dee if he still needs to be taking his Novolog up until he gets his surgery? He says he has not been taking the Ozempic. Ph# [phone]

## 2020-08-29 NOTE — Progress Notes (Signed)
Pre-Operative Nutrition Class:  Appt start time: 1103   End time:  1830.  Patient was seen on 08/28/2020 for Pre-Operative Bariatric Surgery [facility]    Surgery date: 09/05/2020 Surgery type: sleeve Start weight at [facility]: 286.9 Weight today: 292.6   The following the learning objectives were met by the patient during this course:  Identify Pre-Op Dietary Goals and will begin 2 weeks pre-operatively  Identify appropriate sources of fluids and proteins   State protein recommendations and appropriate sources pre and post-operatively  Identify Post-Operative Dietary Goals and will follow for 2 weeks post-operatively  Identify appropriate multivitamin and calcium sources  Describe the need for physical activity post-operatively and will follow MD recommendations  State when to call healthcare provider regarding medication questions or post-operative complications  Handouts given during class include:  Pre-Op Bariatric Surgery Diet Handout  Protein Shake Handout  Post-Op Bariatric Surgery Nutrition Handout  BELT Program Information Flyer  Support Group Information Flyer  [facility] Bariatric Supplements Price List  Follow-Up Plan: Patient will follow-up at [facility] 2 weeks post operatively for diet advancement per MD.

## 2020-08-29 NOTE — Telephone Encounter (Signed)
lisinopril-hydrochlorothiazide (ZESTORETIC) 20-25 MG tablet  Patient having bariatric surgery on 9.7.21, wants to know if he should continue taking his BP medicine

## 2020-08-30 NOTE — Telephone Encounter (Signed)
Spoke to the patient and he stated he can not come in before his bariatric surgery next Tuesday-I advised him to reach out to the surgeon to see what he wants to do moving forward per Dr. Clabe Seal

## 2020-08-30 NOTE — Progress Notes (Signed)
DUE TO COVID-19 ONLY ONE VISITOR IS ALLOWED TO COME WITH YOU AND STAY IN THE WAITING ROOM ONLY DURING PRE OP AND PROCEDURE DAY OF SURGERY. THE 1 VISITOR  MAY VISIT WITH YOU AFTER SURGERY IN YOUR PRIVATE ROOM DURING VISITING HOURS ONLY!  YOU NEED TO HAVE A COVID 19 TEST ON_______ @_______ , THIS TEST MUST BE DONE BEFORE SURGERY,  COVID TESTING SITE [place] [id number], IT IS ON THE RIGHT GOING OUT [address] APPROXIMATELY  2 MINUTES PAST [employer] ON THE RIGHT. ONCE YOUR COVID TEST IS COMPLETED,  PLEASE BEGIN THE QUARANTINE INSTRUCTIONS AS OUTLINED IN YOUR HANDOUT.                Raymond Ryan  08/30/2020   Your procedure is scheduled on:    Report to [facility] Main  Entrance   Report to admitting at AM     Call this number if you have problems the morning of surgery [phone]    REMEMBER: NO  SOLID FOOD CANDY OR GUM AFTER MIDNIGHT. CLEAR LIQUIDS UNTIL         . NOTHING BY MOUTH EXCEPT CLEAR LIQUIDS UNTIL    . PLEASE FINISH ENSURE DRINK PER SURGEON ORDER  WHICH NEEDS TO BE COMPLETED AT      .      CLEAR LIQUID DIET   Foods Allowed                                                                    Coffee and tea, regular and decaf                            Fruit ices (not with fruit pulp)                                      Iced Popsicles                                    Carbonated beverages, regular and diet                                    Cranberry, grape and apple juices Sports drinks like Gatorade Lightly seasoned clear broth or consume(fat free) Sugar, honey syrup ___________________________________________________________________      BRUSH YOUR TEETH MORNING OF SURGERY AND RINSE YOUR MOUTH OUT, NO CHEWING GUM CANDY OR MINTS.     Take these medicines the morning of surgery with A SIP OF WATER:   DO NOT TAKE ANY DIABETIC MEDICATIONS DAY OF YOUR SURGERY                               You may not have any  metal on your body including hair pins and              piercings  Do not wear jewelry, make-up, lotions, powders or perfumes, deodorant  Do not wear nail polish on your fingernails.  Do not shave  48 hours prior to surgery.              Men may shave face and neck.   Do not bring valuables to the hospital. [place].  Contacts, dentures or bridgework may not be worn into surgery.  Leave suitcase in the car. After surgery it may be brought to your room.     Patients discharged the day of surgery will not be allowed to drive home. IF YOU ARE HAVING SURGERY AND GOING HOME THE SAME DAY, YOU MUST HAVE AN ADULT TO DRIVE YOU HOME AND BE WITH YOU FOR 24 HOURS. YOU MAY GO HOME BY TAXI OR UBER OR ORTHERWISE, BUT AN ADULT MUST ACCOMPANY YOU HOME AND STAY WITH YOU FOR 24 HOURS.  Name and phone number of your driver:  Special Instructions: N/A              Please read over the following fact sheets you were given: _____________________________________________________________________  [facility] - Preparing for Surgery Before surgery, you can play an important role.  Because skin is not sterile, your skin needs to be as free of germs as possible.  You can reduce the number of germs on your skin by washing with CHG (chlorahexidine gluconate) soap before surgery.  CHG is an antiseptic cleaner which kills germs and bonds with the skin to continue killing germs even after washing. Please DO NOT use if you have an allergy to CHG or antibacterial soaps.  If your skin becomes reddened/irritated stop using the CHG and inform your nurse when you arrive at Short Stay. Do not shave (including legs and underarms) for at least 48 hours prior to the first CHG shower.  You may shave your face/neck. Please follow these instructions carefully:  1.  Shower with CHG Soap the night before surgery and the  morning of Surgery.  2.  If you choose to wash your hair,  wash your hair first as usual with your  normal  shampoo.  3.  After you shampoo, rinse your hair and body thoroughly to remove the  shampoo.                           4.  Use CHG as you would any other liquid soap.  You can apply chg directly  to the skin and wash                       Gently with a scrungie or clean washcloth.  5.  Apply the CHG Soap to your body ONLY FROM THE NECK DOWN.   Do not use on face/ open                           Wound or open sores. Avoid contact with eyes, ears mouth and genitals (private parts).                       Wash face,  Genitals (private parts) with your normal soap.             6.  Wash thoroughly, paying special attention to the area where your surgery  will be performed.  7.  Thoroughly rinse your body with  warm water from the neck down.  8.  DO NOT shower/wash with your normal soap after using and rinsing off  the CHG Soap.                9.  Pat yourself dry with a clean towel.            10.  Wear clean pajamas.            11.  Place clean sheets on your bed the night of your first shower and do not  sleep with pets. Day of Surgery : Do not apply any lotions/deodorants the morning of surgery.  Please wear clean clothes to the hospital/surgery center.  FAILURE TO FOLLOW THESE INSTRUCTIONS MAY RESULT IN THE CANCELLATION OF YOUR SURGERY PATIENT SIGNATURE_________________________________  NURSE SIGNATURE__________________________________  ________________________________________________________________________             DUE TO COVID-19 ONLY ONE VISITOR IS ALLOWED TO COME WITH YOU AND STAY IN THE WAITING ROOM ONLY DURING PRE OP AND PROCEDURE DAY OF SURGERY. THE 1 VISITOR  MAY VISIT WITH YOU AFTER SURGERY IN YOUR PRIVATE ROOM DURING VISITING HOURS ONLY!  YOU NEED TO HAVE A COVID 19 TEST ON___9/3/21 ____ @__255pm_____ , THIS TEST MUST BE DONE BEFORE SURGERY,  COVID TESTING SITE [address] [place] [address], IT IS ON THE RIGHT GOING OUT  [address] APPROXIMATELY  2 MINUTES PAST [employer] ON THE RIGHT. ONCE YOUR COVID TEST IS COMPLETED,  PLEASE BEGIN THE QUARANTINE INSTRUCTIONS AS OUTLINED IN YOUR HANDOUT.                Raymond Ryan  08/30/2020   Your procedure is scheduled on: 09/05/2020    Report to [facility] Main  Entrance   Report to admitting at    1200 noon     Call this number if you have problems the morning of surgery [phone]    REMEMBER: NO  SOLID FOOD CANDY OR GUM AFTER MIDNIGHT. CLEAR LIQUIDS UNTIL    1100am        . NOTHING BY MOUTH EXCEPT CLEAR LIQUIDS UNTIL    . PLEASE FINISH ENSURE DRINK PER SURGEON ORDER  WHICH NEEDS TO BE COMPLETED AT  1100amAmlodipin    .      CLEAR LIQUID DIET   Foods Allowed                                                                    Coffee and tea, regular and decaf                            Fruit ices (not with fruit pulp)                                      Iced Popsicles                                    Carbonated beverages, regular and diet  Cranberry, grape and apple juices Sports drinks like Gatorade Lightly seasoned clear broth or consume(fat free) Sugar, honey syrup ___________________________________________________________________      BRUSH YOUR TEETH MORNING OF SURGERY AND RINSE YOUR MOUTH OUT, NO CHEWING GUM CANDY OR MINTS.     Take these medicines the morning of surgery with A SIP OF WATER:  Amlodipine ,  DO NOT TAKE ANY DIABETIC MEDICATIONS DAY OF YOUR SURGERY                               You may not have any metal on your body including hair pins and              piercings  Do not wear jewelry, make-up, lotions, powders or perfumes, deodorant             Do not wear nail polish on your fingernails.  Do not shave  48 hours prior to surgery.              Men may shave face and neck.   Do not bring valuables to the hospital. [place].  Contacts, dentures or bridgework may not be worn into surgery.  Leave suitcase in the car. After surgery it may be brought to your room.     Patients discharged the day of surgery will not be allowed to drive home. IF YOU ARE HAVING SURGERY AND GOING HOME THE SAME DAY, YOU MUST HAVE AN ADULT TO DRIVE YOU HOME AND BE WITH YOU FOR 24 HOURS. YOU MAY GO HOME BY TAXI OR UBER OR ORTHERWISE, BUT AN ADULT MUST ACCOMPANY YOU HOME AND STAY WITH YOU FOR 24 HOURS.  Name and phone number of your driver:  Special Instructions: N/A              Please read over the following fact sheets you were given: _____________________________________________________________________  [facility] - Preparing for Surgery Before surgery, you can play an important role.  Because skin is not sterile, your skin needs to be as free of germs as possible.  You can reduce the number of germs on your skin by washing with CHG (chlorahexidine gluconate) soap before surgery.  CHG is an antiseptic cleaner which kills germs and bonds with the skin to continue killing germs even after washing. Please DO NOT use if you have an allergy to CHG or antibacterial soaps.  If your skin becomes reddened/irritated stop using the CHG and inform your nurse when you arrive at Short Stay. Do not shave (including legs and underarms) for at least 48 hours prior to the first CHG shower.  You may shave your face/neck. Please follow these instructions carefully:  1.  Shower with CHG Soap the night before surgery and the  morning of Surgery.  2.  If you choose to wash your hair, wash your hair first as usual with your  normal  shampoo.  3.  After you shampoo, rinse your hair and body thoroughly to remove the  shampoo.                           4.  Use CHG as you would any other liquid soap.  You can apply chg directly  to the skin and wash  Gently with a scrungie or clean washcloth.  5.  Apply the CHG Soap to your body ONLY  FROM THE NECK DOWN.   Do not use on face/ open                           Wound or open sores. Avoid contact with eyes, ears mouth and genitals (private parts).                       Wash face,  Genitals (private parts) with your normal soap.             6.  Wash thoroughly, paying special attention to the area where your surgery  will be performed.  7.  Thoroughly rinse your body with warm water from the neck down.  8.  DO NOT shower/wash with your normal soap after using and rinsing off  the CHG Soap.                9.  Pat yourself dry with a clean towel.            10.  Wear clean pajamas.            11.  Place clean sheets on your bed the night of your first shower and do not  sleep with pets. Day of Surgery : Do not apply any lotions/deodorants the morning of surgery.  Please wear clean clothes to the hospital/surgery center.  FAILURE TO FOLLOW THESE INSTRUCTIONS MAY RESULT IN THE CANCELLATION OF YOUR SURGERY PATIENT SIGNATURE_________________________________  NURSE SIGNATURE__________________________________  ________________________________________________________________________

## 2020-08-31 ENCOUNTER — Other Ambulatory Visit (HOSPITAL_COMMUNITY): Payer: BC Managed Care – PPO

## 2020-09-01 ENCOUNTER — Encounter (HOSPITAL_COMMUNITY)
Admission: RE | Admit: 2020-09-01 | Discharge: 2020-09-01 | Disposition: A | Discharge status: Home / Self Care | Payer: BC Managed Care – PPO | Source: Ambulatory Visit | Attending: General Surgery | Admitting: General Surgery

## 2020-09-01 ENCOUNTER — Other Ambulatory Visit (HOSPITAL_COMMUNITY)
Admission: RE | Admit: 2020-09-01 | Discharge: 2020-09-01 | Disposition: A | Discharge status: Home / Self Care | Payer: BC Managed Care – PPO | Source: Ambulatory Visit | Attending: General Surgery | Admitting: General Surgery

## 2020-09-01 ENCOUNTER — Other Ambulatory Visit: Payer: Self-pay

## 2020-09-01 ENCOUNTER — Encounter (HOSPITAL_COMMUNITY): Payer: Self-pay

## 2020-09-01 DIAGNOSIS — Z01812 Encounter for preprocedural laboratory examination: Secondary | ICD-10-CM | POA: Diagnosis not present

## 2020-09-01 DIAGNOSIS — Z20822 Contact with and (suspected) exposure to covid-19: Secondary | ICD-10-CM | POA: Diagnosis not present

## 2020-09-01 HISTORY — DX: Headache, unspecified: R51.9

## 2020-09-01 LAB — CBC WITH DIFFERENTIAL/PLATELET
Abs Immature Granulocytes: 0.07 10*3/uL (ref 0.00–0.07)
Basophils Absolute: 0.1 10*3/uL (ref 0.0–0.1)
Basophils Relative: 1 %
Eosinophils Absolute: 0.2 10*3/uL (ref 0.0–0.5)
Eosinophils Relative: 2 %
HCT: 47.6 % (ref 39.0–52.0)
Hemoglobin: 15.2 g/dL (ref 13.0–17.0)
Immature Granulocytes: 1 %
Lymphocytes Relative: 23 %
Lymphs Abs: 1.8 10*3/uL (ref 0.7–4.0)
MCH: 28.2 pg (ref 26.0–34.0)
MCHC: 31.9 g/dL (ref 30.0–36.0)
MCV: 88.3 fL (ref 80.0–100.0)
Monocytes Absolute: 0.6 10*3/uL (ref 0.1–1.0)
Monocytes Relative: 8 %
Neutro Abs: 5 10*3/uL (ref 1.7–7.7)
Neutrophils Relative %: 65 %
Platelets: 373 10*3/uL (ref 150–400)
RBC: 5.39 MIL/uL (ref 4.22–5.81)
RDW: 13.9 % (ref 11.5–15.5)
WBC: 7.6 10*3/uL (ref 4.0–10.5)
nRBC: 0 % (ref 0.0–0.2)

## 2020-09-01 LAB — COMPREHENSIVE METABOLIC PANEL
ALT: 23 U/L (ref 0–44)
AST: 19 U/L (ref 15–41)
Albumin: 4.1 g/dL (ref 3.5–5.0)
Alkaline Phosphatase: 62 U/L (ref 38–126)
Anion gap: 9 (ref 5–15)
BUN: 21 mg/dL — ABNORMAL HIGH (ref 6–20)
CO2: 28 mmol/L (ref 22–32)
Calcium: 9.4 mg/dL (ref 8.9–10.3)
Chloride: 102 mmol/L (ref 98–111)
Creatinine, Ser: 1.54 mg/dL — ABNORMAL HIGH (ref 0.61–1.24)
GFR calc Af Amer: 59 mL/min — ABNORMAL LOW (ref >60)
GFR calc non Af Amer: 51 mL/min — ABNORMAL LOW (ref >60)
Glucose, Bld: 226 mg/dL — ABNORMAL HIGH (ref 70–99)
Potassium: 4.4 mmol/L (ref 3.5–5.1)
Sodium: 139 mmol/L (ref 135–145)
Total Bilirubin: 0.5 mg/dL (ref 0.3–1.2)
Total Protein: 7.6 g/dL (ref 6.5–8.1)

## 2020-09-01 LAB — SARS CORONAVIRUS 2 (TAT 6-24 HRS): SARS Coronavirus 2: NEGATIVE

## 2020-09-01 LAB — HEMOGLOBIN A1C
Hgb A1c MFr Bld: 9.5 % — ABNORMAL HIGH (ref 4.8–5.6)
Mean Plasma Glucose: 225.95 mg/dL

## 2020-09-01 LAB — GLUCOSE, CAPILLARY: Glucose-Capillary: 218 mg/dL — ABNORMAL HIGH (ref 70–99)

## 2020-09-01 NOTE — Progress Notes (Signed)
Called office of [facility] and made [place] aware that preop consent order states Dr Hassell Done and that needs to be changed to Dr Redmond Pulling.  OR schedule reads correctly.

## 2020-09-01 NOTE — Telephone Encounter (Signed)
No need to hold prior, but should taken carefully after, as wt loss can lead to lower bp and less need for medicaiton

## 2020-09-01 NOTE — Telephone Encounter (Signed)
Sent to Dr. John. 

## 2020-09-01 NOTE — Progress Notes (Signed)
Reviewed preop instructions with patient for surgery on 09/05/20.  Patient informed nurse that DR Greer Pickerel had spoken to him via phone and was to do his surgery that was originially scheduled with Dr Hassell Done on 09/05/2020.  Patient aware to take Amlodipine am of surgery .  No diabetic meds am of surgery .  Patient instructed to take 1/2 of Tresiba nite before  surgery and usual dose of novolog with supper meal nite before surgery.  Patient voiced understanding.

## 2020-09-03 NOTE — Anesthesia Preprocedure Evaluation (Addendum)
Anesthesia Evaluation  Patient identified by MRN, date of birth, ID band Patient awake    Reviewed: Allergy & Precautions, NPO status , Patient's Chart, lab work & pertinent test results  Airway Mallampati: III  TM Distance: >3 FB Neck ROM: Full    Dental no notable dental hx. (+) Teeth Intact, Dental Advisory Given   Pulmonary neg pulmonary ROS,    Pulmonary exam normal breath sounds clear to auscultation       Cardiovascular hypertension, Pt. on medications Normal cardiovascular exam Rhythm:Regular Rate:Normal     Neuro/Psych  Headaches, negative neurological ROS  negative psych ROS   GI/Hepatic Neg liver ROS, GERD  ,  Endo/Other  diabetes, Well Controlled, Type 2, Oral Hypoglycemic Agents  Renal/GU Renal InsufficiencyRenal diseaseK+ 4.4 Cr 1.54     Musculoskeletal  (+) Arthritis ,   Abdominal (+) + obese,   Peds  Hematology Hgb 15.2    Anesthesia Other Findings   Reproductive/Obstetrics                            Anesthesia Physical Anesthesia Plan  ASA: III  Anesthesia Plan: General   Post-op Pain Management:    Induction: Intravenous  PONV Risk Score and Plan: 3 and Treatment may vary due to age or medical condition, Dexamethasone, Ondansetron and Midazolam  Airway Management Planned: Oral ETT and Video Laryngoscope Planned  Additional Equipment: None  Intra-op Plan:   Post-operative Plan: Extubation in OR  Informed Consent: I have reviewed the patients History and Physical, chart, labs and discussed the procedure including the risks, benefits and alternatives for the proposed anesthesia with the patient or authorized representative who has indicated his/her understanding and acceptance.     Dental advisory given  Plan Discussed with: CRNA and Anesthesiologist  Anesthesia Plan Comments: (GA w lidocaine infusion)       Anesthesia Quick Evaluation

## 2020-09-04 ENCOUNTER — Ambulatory Visit: Payer: Self-pay | Admitting: General Surgery

## 2020-09-04 MED ORDER — BUPIVACAINE LIPOSOME 1.3 % IJ SUSP
20.0000 mL | INTRAMUSCULAR | Status: DC
  Filled 2020-09-04: qty 20

## 2020-09-05 ENCOUNTER — Inpatient Hospital Stay (HOSPITAL_COMMUNITY): Payer: BC Managed Care – PPO | Admitting: Anesthesiology

## 2020-09-05 ENCOUNTER — Inpatient Hospital Stay (HOSPITAL_COMMUNITY)
Admission: RE | Admit: 2020-09-05 | Discharge: 2020-09-06 | DRG: 621 | Disposition: A | Discharge status: Home / Self Care | Payer: BC Managed Care – PPO | Attending: General Surgery | Admitting: General Surgery

## 2020-09-05 ENCOUNTER — Other Ambulatory Visit: Payer: Self-pay

## 2020-09-05 ENCOUNTER — Encounter (HOSPITAL_COMMUNITY)
Admission: RE | Disposition: A | Discharge status: Home / Self Care | Payer: Self-pay | Source: Home / Self Care | Attending: General Surgery

## 2020-09-05 ENCOUNTER — Encounter (HOSPITAL_COMMUNITY): Payer: Self-pay | Admitting: General Surgery

## 2020-09-05 DIAGNOSIS — N182 Chronic kidney disease, stage 2 (mild): Secondary | ICD-10-CM | POA: Diagnosis present

## 2020-09-05 DIAGNOSIS — I129 Hypertensive chronic kidney disease with stage 1 through stage 4 chronic kidney disease, or unspecified chronic kidney disease: Secondary | ICD-10-CM | POA: Diagnosis present

## 2020-09-05 DIAGNOSIS — E785 Hyperlipidemia, unspecified: Secondary | ICD-10-CM | POA: Diagnosis present

## 2020-09-05 DIAGNOSIS — Z833 Family history of diabetes mellitus: Secondary | ICD-10-CM | POA: Diagnosis not present

## 2020-09-05 DIAGNOSIS — N183 Chronic kidney disease, stage 3 unspecified: Secondary | ICD-10-CM | POA: Diagnosis present

## 2020-09-05 DIAGNOSIS — E669 Obesity, unspecified: Secondary | ICD-10-CM | POA: Diagnosis present

## 2020-09-05 DIAGNOSIS — E11610 Type 2 diabetes mellitus with diabetic neuropathic arthropathy: Secondary | ICD-10-CM | POA: Diagnosis present

## 2020-09-05 DIAGNOSIS — E1142 Type 2 diabetes mellitus with diabetic polyneuropathy: Secondary | ICD-10-CM | POA: Diagnosis present

## 2020-09-05 DIAGNOSIS — K219 Gastro-esophageal reflux disease without esophagitis: Secondary | ICD-10-CM

## 2020-09-05 DIAGNOSIS — E1140 Type 2 diabetes mellitus with diabetic neuropathy, unspecified: Secondary | ICD-10-CM | POA: Diagnosis present

## 2020-09-05 DIAGNOSIS — Z794 Long term (current) use of insulin: Secondary | ICD-10-CM | POA: Diagnosis not present

## 2020-09-05 DIAGNOSIS — Z9884 Bariatric surgery status: Secondary | ICD-10-CM

## 2020-09-05 DIAGNOSIS — Z79899 Other long term (current) drug therapy: Secondary | ICD-10-CM | POA: Diagnosis not present

## 2020-09-05 DIAGNOSIS — M14671 Charcot's joint, right ankle and foot: Secondary | ICD-10-CM | POA: Diagnosis present

## 2020-09-05 DIAGNOSIS — Z6837 Body mass index (BMI) 37.0-37.9, adult: Secondary | ICD-10-CM | POA: Diagnosis not present

## 2020-09-05 DIAGNOSIS — K5900 Constipation, unspecified: Secondary | ICD-10-CM | POA: Diagnosis present

## 2020-09-05 DIAGNOSIS — Z8249 Family history of ischemic heart disease and other diseases of the circulatory system: Secondary | ICD-10-CM | POA: Diagnosis not present

## 2020-09-05 DIAGNOSIS — E1122 Type 2 diabetes mellitus with diabetic chronic kidney disease: Secondary | ICD-10-CM | POA: Diagnosis present

## 2020-09-05 DIAGNOSIS — Z8042 Family history of malignant neoplasm of prostate: Secondary | ICD-10-CM

## 2020-09-05 DIAGNOSIS — I10 Essential (primary) hypertension: Secondary | ICD-10-CM | POA: Diagnosis present

## 2020-09-05 HISTORY — PX: UPPER GI ENDOSCOPY: SHX6162

## 2020-09-05 HISTORY — PX: LAPAROSCOPIC GASTRIC SLEEVE RESECTION: SHX5895

## 2020-09-05 LAB — TYPE AND SCREEN
ABO/RH(D): O POS
Antibody Screen: NEGATIVE

## 2020-09-05 LAB — GLUCOSE, CAPILLARY
Glucose-Capillary: 133 mg/dL — ABNORMAL HIGH (ref 70–99)
Glucose-Capillary: 218 mg/dL — ABNORMAL HIGH (ref 70–99)
Glucose-Capillary: 235 mg/dL — ABNORMAL HIGH (ref 70–99)
Glucose-Capillary: 236 mg/dL — ABNORMAL HIGH (ref 70–99)
Glucose-Capillary: 250 mg/dL — ABNORMAL HIGH (ref 70–99)
Glucose-Capillary: 259 mg/dL — ABNORMAL HIGH (ref 70–99)
Glucose-Capillary: 283 mg/dL — ABNORMAL HIGH (ref 70–99)

## 2020-09-05 LAB — HEMOGLOBIN AND HEMATOCRIT, BLOOD
HCT: 45.4 % (ref 39.0–52.0)
Hemoglobin: 14.4 g/dL (ref 13.0–17.0)

## 2020-09-05 LAB — ABO/RH: ABO/RH(D): O POS

## 2020-09-05 SURGERY — GASTRECTOMY, SLEEVE, LAPAROSCOPIC
Anesthesia: General | Site: Esophagus

## 2020-09-05 MED ORDER — HYDROMORPHONE HCL 1 MG/ML IJ SOLN
INTRAMUSCULAR | Status: AC
  Filled 2020-09-05: qty 1

## 2020-09-05 MED ORDER — EPHEDRINE SULFATE-NACL 50-0.9 MG/10ML-% IV SOSY
PREFILLED_SYRINGE | INTRAVENOUS | Status: DC | PRN
  Administered 2020-09-05: 10 mg via INTRAVENOUS

## 2020-09-05 MED ORDER — AMISULPRIDE (ANTIEMETIC) 5 MG/2ML IV SOLN: 10.0000 mg | Freq: Once | INTRAVENOUS | Status: DC | PRN

## 2020-09-05 MED ORDER — ACETAMINOPHEN 500 MG PO TABS
1000.0000 mg | ORAL_TABLET | Freq: Three times a day (TID) | ORAL | Status: DC
  Administered 2020-09-06 (×2): 1000 mg via ORAL
  Filled 2020-09-05: qty 2

## 2020-09-05 MED ORDER — FENTANYL CITRATE (PF) 250 MCG/5ML IJ SOLN
INTRAMUSCULAR | Status: DC | PRN
Start: 2020-09-05 — End: 2020-09-05
  Administered 2020-09-05: 150 ug via INTRAVENOUS

## 2020-09-05 MED ORDER — ROCURONIUM BROMIDE 10 MG/ML (PF) SYRINGE
PREFILLED_SYRINGE | INTRAVENOUS | Status: DC | PRN
  Administered 2020-09-05: 90 mg via INTRAVENOUS

## 2020-09-05 MED ORDER — BUPIVACAINE LIPOSOME 1.3 % IJ SUSP
INTRAMUSCULAR | Status: DC | PRN
  Administered 2020-09-05: 20 mL

## 2020-09-05 MED ORDER — CHLORHEXIDINE GLUCONATE CLOTH 2 % EX PADS: 6.0000 | MEDICATED_PAD | Freq: Once | CUTANEOUS | Status: DC

## 2020-09-05 MED ORDER — SODIUM CHLORIDE 0.9 % IV SOLN
2.0000 g | INTRAVENOUS | Status: AC
  Administered 2020-09-05: 2 g via INTRAVENOUS
  Filled 2020-09-05: qty 2

## 2020-09-05 MED ORDER — POTASSIUM CHLORIDE IN NACL 20-0.45 MEQ/L-% IV SOLN
INTRAVENOUS | Status: DC
  Filled 2020-09-05 (×3): qty 1000

## 2020-09-05 MED ORDER — DEXAMETHASONE SODIUM PHOSPHATE 10 MG/ML IJ SOLN
INTRAMUSCULAR | Status: AC
  Filled 2020-09-05: qty 1

## 2020-09-05 MED ORDER — GABAPENTIN 100 MG PO CAPS
200.0000 mg | ORAL_CAPSULE | Freq: Two times a day (BID) | ORAL | Status: DC
  Administered 2020-09-05 – 2020-09-06 (×3): 200 mg via ORAL
  Filled 2020-09-05 (×3): qty 2

## 2020-09-05 MED ORDER — DEXAMETHASONE SODIUM PHOSPHATE 10 MG/ML IJ SOLN
INTRAMUSCULAR | Status: DC | PRN
  Administered 2020-09-05: 10 mg via INTRAVENOUS

## 2020-09-05 MED ORDER — HEPARIN SODIUM (PORCINE) 5000 UNIT/ML IJ SOLN
5000.0000 [IU] | INTRAMUSCULAR | Status: AC
  Administered 2020-09-05: 5000 [IU] via SUBCUTANEOUS
  Filled 2020-09-05: qty 1

## 2020-09-05 MED ORDER — PROPOFOL 10 MG/ML IV BOLUS
INTRAVENOUS | Status: AC
  Filled 2020-09-05: qty 20

## 2020-09-05 MED ORDER — SUGAMMADEX SODIUM 500 MG/5ML IV SOLN
INTRAVENOUS | Status: AC
  Filled 2020-09-05: qty 5

## 2020-09-05 MED ORDER — AMLODIPINE BESYLATE 5 MG PO TABS
5.0000 mg | ORAL_TABLET | Freq: Every day | ORAL | Status: DC
  Administered 2020-09-06: 5 mg via ORAL
  Filled 2020-09-05: qty 1

## 2020-09-05 MED ORDER — LABETALOL HCL 5 MG/ML IV SOLN: 10.0000 mg | Freq: Once | INTRAVENOUS | Status: DC

## 2020-09-05 MED ORDER — ONDANSETRON HCL 4 MG/2ML IJ SOLN: 4.0000 mg | Freq: Four times a day (QID) | INTRAMUSCULAR | Status: DC | PRN

## 2020-09-05 MED ORDER — ONDANSETRON HCL 4 MG/2ML IJ SOLN
INTRAMUSCULAR | Status: DC | PRN
  Administered 2020-09-05: 4 mg via INTRAVENOUS

## 2020-09-05 MED ORDER — SCOPOLAMINE 1 MG/3DAYS TD PT72
1.0000 | MEDICATED_PATCH | TRANSDERMAL | Status: DC
  Administered 2020-09-05: 1.5 mg via TRANSDERMAL
  Filled 2020-09-05: qty 1

## 2020-09-05 MED ORDER — ACETAMINOPHEN 10 MG/ML IV SOLN
1000.0000 mg | Freq: Once | INTRAVENOUS | Status: DC | PRN
  Administered 2020-09-05: 1000 mg via INTRAVENOUS

## 2020-09-05 MED ORDER — LABETALOL HCL 5 MG/ML IV SOLN
INTRAVENOUS | Status: AC
  Filled 2020-09-05: qty 4

## 2020-09-05 MED ORDER — LISINOPRIL-HYDROCHLOROTHIAZIDE 20-25 MG PO TABS: 1.0000 | ORAL_TABLET | Freq: Every day | ORAL | Status: DC

## 2020-09-05 MED ORDER — ONDANSETRON HCL 4 MG/2ML IJ SOLN: 4.0000 mg | Freq: Once | INTRAMUSCULAR | Status: DC | PRN

## 2020-09-05 MED ORDER — INSULIN ASPART 100 UNIT/ML ~~LOC~~ SOLN: 4.0000 [IU] | Freq: Once | SUBCUTANEOUS | Status: AC

## 2020-09-05 MED ORDER — LIDOCAINE 2% (20 MG/ML) 5 ML SYRINGE
INTRAMUSCULAR | Status: AC
  Filled 2020-09-05: qty 5

## 2020-09-05 MED ORDER — PHENYLEPHRINE HCL (PRESSORS) 10 MG/ML IV SOLN
INTRAVENOUS | Status: AC
  Filled 2020-09-05: qty 1

## 2020-09-05 MED ORDER — ENSURE MAX PROTEIN PO LIQD
2.0000 [oz_av] | ORAL | Status: DC
  Administered 2020-09-06 (×5): 2 [oz_av] via ORAL
  Filled 2020-09-05 (×9): qty 330

## 2020-09-05 MED ORDER — ROCURONIUM BROMIDE 10 MG/ML (PF) SYRINGE
PREFILLED_SYRINGE | INTRAVENOUS | Status: AC
  Filled 2020-09-05: qty 10

## 2020-09-05 MED ORDER — ACETAMINOPHEN 160 MG/5ML PO SOLN
1000.0000 mg | Freq: Three times a day (TID) | ORAL | Status: DC
  Administered 2020-09-05 (×2): 1000 mg via ORAL
  Filled 2020-09-05 (×2): qty 40.6

## 2020-09-05 MED ORDER — MIDAZOLAM HCL 2 MG/2ML IJ SOLN
INTRAMUSCULAR | Status: AC
  Filled 2020-09-05: qty 2

## 2020-09-05 MED ORDER — PHENYLEPHRINE 40 MCG/ML (10ML) SYRINGE FOR IV PUSH (FOR BLOOD PRESSURE SUPPORT)
PREFILLED_SYRINGE | INTRAVENOUS | Status: DC | PRN
  Administered 2020-09-05 (×3): 80 ug via INTRAVENOUS

## 2020-09-05 MED ORDER — OXYCODONE HCL 5 MG/5ML PO SOLN
5.0000 mg | Freq: Four times a day (QID) | ORAL | Status: DC | PRN
  Administered 2020-09-05 – 2020-09-06 (×3): 5 mg via ORAL
  Filled 2020-09-05 (×3): qty 5

## 2020-09-05 MED ORDER — SODIUM CHLORIDE (PF) 0.9 % IJ SOLN
INTRAMUSCULAR | Status: AC
  Filled 2020-09-05: qty 50

## 2020-09-05 MED ORDER — KETAMINE HCL 10 MG/ML IJ SOLN
INTRAMUSCULAR | Status: AC
  Filled 2020-09-05: qty 1

## 2020-09-05 MED ORDER — LIDOCAINE HCL 2 % IJ SOLN
INTRAMUSCULAR | Status: AC
  Filled 2020-09-05: qty 20

## 2020-09-05 MED ORDER — PANTOPRAZOLE SODIUM 40 MG IV SOLR
40.0000 mg | Freq: Every day | INTRAVENOUS | Status: DC
  Administered 2020-09-05: 40 mg via INTRAVENOUS
  Filled 2020-09-05: qty 40

## 2020-09-05 MED ORDER — ACETAMINOPHEN 500 MG PO TABS
1000.0000 mg | ORAL_TABLET | ORAL | Status: AC
  Administered 2020-09-05: 1000 mg via ORAL
  Filled 2020-09-05: qty 2

## 2020-09-05 MED ORDER — APREPITANT 40 MG PO CAPS
40.0000 mg | ORAL_CAPSULE | ORAL | Status: AC
  Administered 2020-09-05: 40 mg via ORAL
  Filled 2020-09-05: qty 1

## 2020-09-05 MED ORDER — GABAPENTIN 300 MG PO CAPS
300.0000 mg | ORAL_CAPSULE | ORAL | Status: AC
  Administered 2020-09-05: 300 mg via ORAL
  Filled 2020-09-05: qty 1

## 2020-09-05 MED ORDER — INSULIN ASPART 100 UNIT/ML ~~LOC~~ SOLN
0.0000 [IU] | SUBCUTANEOUS | Status: DC
  Administered 2020-09-05: 11 [IU] via SUBCUTANEOUS
  Administered 2020-09-05: 7 [IU] via SUBCUTANEOUS
  Administered 2020-09-05: 11 [IU] via SUBCUTANEOUS
  Administered 2020-09-06: 4 [IU] via SUBCUTANEOUS
  Administered 2020-09-06: 7 [IU] via SUBCUTANEOUS
  Administered 2020-09-06: 4 [IU] via SUBCUTANEOUS
  Administered 2020-09-06: 7 [IU] via SUBCUTANEOUS

## 2020-09-05 MED ORDER — LIDOCAINE 2% (20 MG/ML) 5 ML SYRINGE
INTRAMUSCULAR | Status: DC | PRN
  Administered 2020-09-05: 100 mg via INTRAVENOUS

## 2020-09-05 MED ORDER — INSULIN ASPART 100 UNIT/ML ~~LOC~~ SOLN
SUBCUTANEOUS | Status: AC
  Administered 2020-09-05: 4 [IU] via SUBCUTANEOUS
  Filled 2020-09-05: qty 1

## 2020-09-05 MED ORDER — OXYCODONE HCL 5 MG/5ML PO SOLN: 5.0000 mg | Freq: Once | ORAL | Status: DC | PRN

## 2020-09-05 MED ORDER — LACTATED RINGERS IR SOLN
Status: DC | PRN
  Administered 2020-09-05: 1000 mL

## 2020-09-05 MED ORDER — PHENYLEPHRINE 40 MCG/ML (10ML) SYRINGE FOR IV PUSH (FOR BLOOD PRESSURE SUPPORT)
PREFILLED_SYRINGE | INTRAVENOUS | Status: AC
  Filled 2020-09-05: qty 10

## 2020-09-05 MED ORDER — HYDROCHLOROTHIAZIDE 25 MG PO TABS
25.0000 mg | ORAL_TABLET | Freq: Every day | ORAL | Status: DC
  Administered 2020-09-06: 25 mg via ORAL
  Filled 2020-09-05: qty 1

## 2020-09-05 MED ORDER — 0.9 % SODIUM CHLORIDE (POUR BTL) OPTIME
TOPICAL | Status: DC | PRN
  Administered 2020-09-05: 1000 mL

## 2020-09-05 MED ORDER — ACETAMINOPHEN 10 MG/ML IV SOLN
INTRAVENOUS | Status: AC
  Filled 2020-09-05: qty 100

## 2020-09-05 MED ORDER — LIDOCAINE 2% (20 MG/ML) 5 ML SYRINGE
INTRAMUSCULAR | Status: DC | PRN
  Administered 2020-09-05: 1.5 mg/kg/h via INTRAVENOUS

## 2020-09-05 MED ORDER — SUGAMMADEX SODIUM 200 MG/2ML IV SOLN
INTRAVENOUS | Status: DC | PRN
  Administered 2020-09-05: 400 mg via INTRAVENOUS

## 2020-09-05 MED ORDER — SIMETHICONE 80 MG PO CHEW
80.0000 mg | CHEWABLE_TABLET | Freq: Four times a day (QID) | ORAL | Status: DC | PRN
  Administered 2020-09-06: 80 mg via ORAL
  Filled 2020-09-05: qty 1

## 2020-09-05 MED ORDER — MIDAZOLAM HCL 2 MG/2ML IJ SOLN
INTRAMUSCULAR | Status: DC | PRN
  Administered 2020-09-05: 2 mg via INTRAVENOUS

## 2020-09-05 MED ORDER — ONDANSETRON HCL 4 MG/2ML IJ SOLN
INTRAMUSCULAR | Status: AC
  Filled 2020-09-05: qty 2

## 2020-09-05 MED ORDER — EPHEDRINE 5 MG/ML INJ
INTRAVENOUS | Status: AC
  Filled 2020-09-05: qty 10

## 2020-09-05 MED ORDER — FENTANYL CITRATE (PF) 250 MCG/5ML IJ SOLN
INTRAMUSCULAR | Status: AC
  Filled 2020-09-05: qty 5

## 2020-09-05 MED ORDER — LISINOPRIL 20 MG PO TABS
20.0000 mg | ORAL_TABLET | Freq: Every day | ORAL | Status: DC
  Administered 2020-09-06: 20 mg via ORAL
  Filled 2020-09-05: qty 1

## 2020-09-05 MED ORDER — PROPOFOL 10 MG/ML IV BOLUS
INTRAVENOUS | Status: DC | PRN
  Administered 2020-09-05: 200 mg via INTRAVENOUS

## 2020-09-05 MED ORDER — OXYCODONE HCL 5 MG PO TABS: 5.0000 mg | ORAL_TABLET | Freq: Once | ORAL | Status: DC | PRN

## 2020-09-05 MED ORDER — SODIUM CHLORIDE (PF) 0.9 % IJ SOLN
INTRAMUSCULAR | Status: DC | PRN
  Administered 2020-09-05: 50 mL

## 2020-09-05 MED ORDER — HYDRALAZINE HCL 20 MG/ML IJ SOLN: 10.0000 mg | INTRAMUSCULAR | Status: DC | PRN

## 2020-09-05 MED ORDER — LACTATED RINGERS IV SOLN: INTRAVENOUS | Status: DC | PRN

## 2020-09-05 MED ORDER — ENALAPRILAT 1.25 MG/ML IV SOLN
1.2500 mg | Freq: Four times a day (QID) | INTRAVENOUS | Status: AC | PRN
  Filled 2020-09-05: qty 1

## 2020-09-05 MED ORDER — HYDROMORPHONE HCL 1 MG/ML IJ SOLN
0.2500 mg | INTRAMUSCULAR | Status: DC | PRN
  Administered 2020-09-05: 0.5 mg via INTRAVENOUS

## 2020-09-05 MED ORDER — KETAMINE HCL 10 MG/ML IJ SOLN
INTRAMUSCULAR | Status: DC | PRN
  Administered 2020-09-05: 60 mg via INTRAVENOUS

## 2020-09-05 MED ORDER — ENOXAPARIN SODIUM 30 MG/0.3ML ~~LOC~~ SOLN
30.0000 mg | Freq: Two times a day (BID) | SUBCUTANEOUS | Status: DC
  Administered 2020-09-05 – 2020-09-06 (×2): 30 mg via SUBCUTANEOUS
  Filled 2020-09-05 (×2): qty 0.3

## 2020-09-05 MED ORDER — MORPHINE SULFATE (PF) 2 MG/ML IV SOLN
1.0000 mg | INTRAVENOUS | Status: DC | PRN
  Administered 2020-09-05: 2 mg via INTRAVENOUS
  Filled 2020-09-05: qty 1

## 2020-09-05 MED ORDER — PHENYLEPHRINE HCL-NACL 10-0.9 MG/250ML-% IV SOLN
INTRAVENOUS | Status: DC | PRN
  Administered 2020-09-05: 50 ug/min via INTRAVENOUS

## 2020-09-05 SURGICAL SUPPLY — 85 items
APL PRP STRL LF DISP 70% ISPRP (MISCELLANEOUS) ×4
APL SKNCLS STERI-STRIP NONHPOA (GAUZE/BANDAGES/DRESSINGS) ×2
APL SRG 32X5 SNPLK LF DISP (MISCELLANEOUS)
APL SWBSTK 6 STRL LF DISP (MISCELLANEOUS)
APPLICATOR COTTON TIP 6 STRL (MISCELLANEOUS) IMPLANT
APPLICATOR COTTON TIP 6IN STRL (MISCELLANEOUS)
APPLIER CLIP ROT 10 11.4 M/L (STAPLE)
APPLIER CLIP ROT 13.4 12 LRG (CLIP)
APR CLP LRG 13.4X12 ROT 20 MLT (CLIP)
APR CLP MED LRG 11.4X10 (STAPLE)
BENZOIN TINCTURE PRP APPL 2/3 (GAUZE/BANDAGES/DRESSINGS) ×4 IMPLANT
BLADE SURG SZ11 CARB STEEL (BLADE) ×4 IMPLANT
BNDG ADH 1X3 SHEER STRL LF (GAUZE/BANDAGES/DRESSINGS) ×24 IMPLANT
BNDG ADH THN 3X1 STRL LF (GAUZE/BANDAGES/DRESSINGS) ×12
CABLE HIGH FREQUENCY MONO STRZ (ELECTRODE) ×4 IMPLANT
CHLORAPREP W/TINT 26 (MISCELLANEOUS) ×8 IMPLANT
CLIP APPLIE ROT 10 11.4 M/L (STAPLE) IMPLANT
CLIP APPLIE ROT 13.4 12 LRG (CLIP) IMPLANT
CLOSURE WOUND 1/2 X4 (GAUZE/BANDAGES/DRESSINGS) ×1
COVER SURGICAL LIGHT HANDLE (MISCELLANEOUS) ×4 IMPLANT
COVER WAND RF STERILE (DRAPES) IMPLANT
DECANTER SPIKE VIAL GLASS SM (MISCELLANEOUS) ×4 IMPLANT
DEVICE SUT QUICK LOAD TK 5 (STAPLE) IMPLANT
DEVICE SUT TI-KNOT TK 5X26 (MISCELLANEOUS) IMPLANT
DEVICE SUTURE ENDOST 10MM (ENDOMECHANICALS) IMPLANT
DEVICE TI KNOT TK5 (MISCELLANEOUS)
DISSECTOR BLUNT TIP ENDO 5MM (MISCELLANEOUS) IMPLANT
DRAPE UTILITY XL STRL (DRAPES) ×8 IMPLANT
DRSG TEGADERM 2-3/8X2-3/4 SM (GAUZE/BANDAGES/DRESSINGS) ×2 IMPLANT
ELECT L-HOOK LAP 45CM DISP (ELECTROSURGICAL)
ELECT REM PT RETURN 15FT ADLT (MISCELLANEOUS) ×4 IMPLANT
ELECTRODE L-HOOK LAP 45CM DISP (ELECTROSURGICAL) IMPLANT
GAUZE SPONGE 2X2 8PLY STRL LF (GAUZE/BANDAGES/DRESSINGS) IMPLANT
GAUZE SPONGE 4X4 12PLY STRL (GAUZE/BANDAGES/DRESSINGS) IMPLANT
GLOVE BIO SURGEON STRL SZ7.5 (GLOVE) ×4 IMPLANT
GLOVE INDICATOR 8.0 STRL GRN (GLOVE) ×4 IMPLANT
GOWN STRL REUS W/TWL XL LVL3 (GOWN DISPOSABLE) ×12 IMPLANT
GRASPER SUT TROCAR 14GX15 (MISCELLANEOUS) ×4 IMPLANT
KIT BASIN OR (CUSTOM PROCEDURE TRAY) ×4 IMPLANT
KIT TURNOVER KIT A (KITS) IMPLANT
MARKER SKIN DUAL TIP RULER LAB (MISCELLANEOUS) ×4 IMPLANT
MAT PREVALON FULL STRYKER (MISCELLANEOUS) ×2 IMPLANT
NDL SPNL 22GX3.5 QUINCKE BK (NEEDLE) ×2 IMPLANT
NEEDLE SPNL 22GX3.5 QUINCKE BK (NEEDLE) ×4 IMPLANT
PACK UNIVERSAL I (CUSTOM PROCEDURE TRAY) ×4 IMPLANT
PENCIL SMOKE EVACUATOR (MISCELLANEOUS) IMPLANT
QUICK LOAD TK 5 (STAPLE)
RELOAD STAPLE 60 3.6 BLU REG (STAPLE) ×2 IMPLANT
RELOAD STAPLE 60 3.8 GOLD REG (STAPLE) IMPLANT
RELOAD STAPLE 60 4.1 GRN THCK (STAPLE) ×2 IMPLANT
RELOAD STAPLE 60 BLK VRY/THCK (STAPLE) IMPLANT
RELOAD STAPLER 60MM BLK (STAPLE) IMPLANT
RELOAD STAPLER BLUE 60MM (STAPLE) ×4 IMPLANT
RELOAD STAPLER GOLD 60MM (STAPLE) IMPLANT
RELOAD STAPLER GREEN 60MM (STAPLE) ×4 IMPLANT
SCISSORS LAP 5X45 EPIX DISP (ENDOMECHANICALS) IMPLANT
SEALANT SURGICAL APPL DUAL CAN (MISCELLANEOUS) IMPLANT
SET IRRIG TUBING LAPAROSCOPIC (IRRIGATION / IRRIGATOR) ×4 IMPLANT
SET TUBE SMOKE EVAC HIGH FLOW (TUBING) ×4 IMPLANT
SHEARS HARMONIC ACE PLUS 45CM (MISCELLANEOUS) ×4 IMPLANT
SLEEVE GASTRECTOMY 40FR VISIGI (MISCELLANEOUS) ×4 IMPLANT
SLEEVE XCEL OPT CAN 5 100 (ENDOMECHANICALS) ×12 IMPLANT
SOL ANTI FOG 6CC (MISCELLANEOUS) ×2 IMPLANT
SOLUTION ANTI FOG 6CC (MISCELLANEOUS) ×2
SPONGE GAUZE 2X2 STER 10/PKG (GAUZE/BANDAGES/DRESSINGS) ×2
SPONGE LAP 18X18 RF (DISPOSABLE) ×4 IMPLANT
STAPLER ECHELON BIOABSB 60 FLE (MISCELLANEOUS) ×20 IMPLANT
STAPLER ECHELON LONG 60 440 (INSTRUMENTS) ×4 IMPLANT
STAPLER RELOAD 60MM BLK (STAPLE)
STAPLER RELOAD BLUE 60MM (STAPLE) ×8
STAPLER RELOAD GOLD 60MM (STAPLE)
STAPLER RELOAD GREEN 60MM (STAPLE) ×8
STRIP CLOSURE SKIN 1/2X4 (GAUZE/BANDAGES/DRESSINGS) ×3 IMPLANT
SUT MNCRL AB 4-0 PS2 18 (SUTURE) ×4 IMPLANT
SUT SURGIDAC NAB ES-9 0 48 120 (SUTURE) IMPLANT
SUT VICRYL 0 TIES 12 18 (SUTURE) ×4 IMPLANT
SYR 20ML LL LF (SYRINGE) ×4 IMPLANT
SYR 50ML LL SCALE MARK (SYRINGE) ×4 IMPLANT
TOWEL OR 17X26 10 PK STRL BLUE (TOWEL DISPOSABLE) ×4 IMPLANT
TOWEL OR NON WOVEN STRL DISP B (DISPOSABLE) ×4 IMPLANT
TROCAR BLADELESS 15MM (ENDOMECHANICALS) ×4 IMPLANT
TROCAR BLADELESS OPT 5 100 (ENDOMECHANICALS) ×4 IMPLANT
TUBING CONNECTING 10 (TUBING) ×6 IMPLANT
TUBING CONNECTING 10' (TUBING) ×2
TUBING ENDO SMARTCAP (MISCELLANEOUS) ×4 IMPLANT

## 2020-09-05 NOTE — Progress Notes (Addendum)
PHARMACY CONSULT FOR:  Risk Assessment for Post-Discharge VTE Following Bariatric Surgery  Post-Discharge VTE Risk Assessment: This patient's probability of 30-day post-discharge VTE is increased due to the factors marked: X  Male    Age >/=60 years    BMI >/=50 kg/m2    CHF    Dyspnea at Rest    Paraplegia  X  Non-gastric-band surgery    Operation Time >/=3 hr    Return to OR     Length of Stay >/= 3 d   Hx of VTE   Hypercoagulable condition   Significant venous stasis   Predicted probability of 30-day post-discharge VTE: 0.31%  Other patient-specific factors to consider: none   Recommendation for Discharge: . No pharmacologic prophylaxis post-discharge . Follow daily and recalculate estimated 30d VTE risk if returns to OR or LOS reaches 3 days.   Raymond Ryan is a 54 y.o. 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  Recent Labs    09/05/20 1006  HGB 14.4  HCT 45.4   Estimated Creatinine Clearance: 82.2 mL/min (A) (by C-G formula based on SCr of 1.54 mg/dL (H)).    Past Medical History:  Diagnosis Date  . Charcot foot due to diabetes mellitus ([place])   . Constipation    sometimes soft, some hard -- does go daily - are on fiber   . Diabetes mellitus   . GERD (gastroesophageal reflux disease)   . Headache   . Hyperlipidemia   . Hypertension   . Neuromuscular disorder ([place])    neuropathy     Medications Prior to Admission  Medication Sig Dispense Refill Last Dose  . acetaminophen (TYLENOL) 500 MG tablet Take 1,000 mg by mouth every 6 (six) hours as needed (FOR PAIN.).   09/04/2020 at Unknown time  . amLODipine (NORVASC) 5 MG tablet Take 1 tablet (5 mg total) by mouth daily. 90 tablet 3 09/04/2020 at Unknown time  . gabapentin (NEURONTIN)  300 MG capsule Take 1 capsule (300 mg total) by mouth at bedtime as needed. (Patient taking differently: Take 300 mg by mouth daily as needed (pain.). ) 90 capsule 1 Past Week at Unknown time  . lisinopril-hydrochlorothiazide (ZESTORETIC) 20-25 MG tablet Take 1 tablet by mouth daily. 90 tablet 3 09/04/2020 at Unknown time  . metFORMIN (GLUCOPHAGE) 500 MG tablet TAKE 2 TABLETS (1,000 MG TOTAL) BY MOUTH DAILY WITH SUPPER. (Patient taking differently: Take 500 mg by mouth in the morning and at bedtime. ) 180 tablet 3 09/04/2020 at Unknown time  . Multiple Vitamin (MULTIVITAMIN WITH MINERALS) TABS tablet Take 1 tablet by mouth daily.   09/04/2020 at Unknown time  . NOVOLOG FLEXPEN 100 UNIT/ML FlexPen INJECT UP TO 24 UNITS UNDER THE SKIN BEFORE MEALS **NEEDS APPT** (Patient taking differently: Inject 30 Units into the skin daily before supper. ) 30 mL 1 09/04/2020 at Unknown time  . TRESIBA FLEXTOUCH 200 UNIT/ML SOPN INJECT 50 UNITS INTO THE SKIN DAILY. INCREASE DOSE TO 54 UNITS IF MORNING SUGARS CONSISTENTLY OVER 140 (Patient taking differently: Inject 60 Units into the skin at bedtime. ) 9 pen 2 09/04/2020 at Unknown time  . Blood Glucose Monitoring Suppl (ONETOUCH VERIO FLEX SYSTEM) w/Device KIT by Does not apply route.     . Insulin Pen Needle 31G X  5 MM MISC Use for insulin 4 times a day 100 each 1   . Insulin Syringes, Disposable, U-100 0.3 ML MISC Inject 1 each into the skin 3 (three) times daily. 100 each 11   . ONETOUCH VERIO test strip USE AS INSTRUCTED TO CHECK BLOOD SUGAR THREE TIMES DAILY.. 100 strip 3   . OZEMPIC, 0.25 OR 0.5 MG/DOSE, 2 MG/1.5ML SOPN INJECT 0.5 MG INTO THE SKIN ONCE A WEEK. 1.5 mL 1 More than a month at Unknown time      Reuel Boom, PharmD, BCPS [phone] 09/05/2020, 11:22 AM

## 2020-09-05 NOTE — Telephone Encounter (Signed)
Tried calling pt and was not able to LVM of Dr. Gwynn Burly note.

## 2020-09-05 NOTE — Transfer of Care (Signed)
Immediate Anesthesia Transfer of Care Note  Patient: Raymond Ryan  Procedure(s) Performed: LAPAROSCOPIC SLEEVE GASTRECTOMY (N/A Abdomen) UPPER GI ENDOSCOPY (N/A Esophagus)  Patient Location: PACU  Anesthesia Type:General  Level of Consciousness: drowsy  Airway & Oxygen Therapy: Patient Spontanous Breathing and Patient connected to face mask oxygen  Post-op Assessment: Report given to RN and Post -op Vital signs reviewed and stable  Post vital signs: Reviewed and stable  Last Vitals:  Vitals Value Taken Time  BP    Temp    Pulse 88 09/05/20 0928  Resp 15 09/05/20 0928  SpO2 100 % 09/05/20 0928  Vitals shown include unvalidated device data.  Last Pain:  Vitals:   09/05/20 0607  TempSrc:   PainSc: 0-No pain         Complications: No complications documented.

## 2020-09-05 NOTE — Anesthesia Postprocedure Evaluation (Signed)
Anesthesia Post Note  Patient: Raymond Ryan  Procedure(s) Performed: LAPAROSCOPIC SLEEVE GASTRECTOMY (N/A Abdomen) UPPER GI ENDOSCOPY (N/A Esophagus)     Patient location during evaluation: PACU Anesthesia Type: General Level of consciousness: awake and alert Pain management: pain level controlled Vital Signs Assessment: post-procedure vital signs reviewed and stable Respiratory status: spontaneous breathing, nonlabored ventilation, respiratory function stable and patient connected to nasal cannula oxygen Cardiovascular status: blood pressure returned to baseline and stable Postop Assessment: no apparent nausea or vomiting Anesthetic complications: no   No complications documented.  Last Vitals:  Vitals:   09/05/20 1030 09/05/20 1054  BP: (!) 158/84 (!) 160/89  Pulse: 84 83  Resp: 14 18  Temp:  36.8 C  SpO2: 95% 99%    Last Pain:  Vitals:   09/05/20 1054  TempSrc: Oral  PainSc: 8                  Barnet Glasgow

## 2020-09-05 NOTE — Op Note (Signed)
09/05/2020 Raymond Ryan 04/29/1966 [id number]   PRE-OPERATIVE DIAGNOSIS:     Obesity (BMI 36)   HTN (hypertension)   Type 2 diabetes mellitus with stage 2 chronic kidney disease, without long-term current use of insulin (HCC)   Charcot's joint of right foot   Hyperlipidemia   Diabetic 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 (HCC)   CKD (chronic kidney disease) stage 3, GFR 30-59 ml/min   GERD (gastroesophageal reflux disease)   POST-OPERATIVE DIAGNOSIS:  same  PROCEDURE:  Procedure(s): LAPAROSCOPIC SLEEVE GASTRECTOMY  UPPER GI ENDOSCOPY  SURGEON:  Surgeon(s): Gayland Curry, MD FACS FASMBS  ASSISTANTS: Romana Juniper MD FACS  ANESTHESIA:   general  DRAINS: none   BOUGIE: 40 fr ViSiGi  LOCAL MEDICATIONS USED:   Exparel  EBL: minimal   SPECIMEN:  Source of Specimen:  Greater curvature of stomach  DISPOSITION OF SPECIMEN:  PATHOLOGY  COUNTS:  YES  INDICATION FOR PROCEDURE: This is a very pleasant 54 y.o.-year-old morbidly obese male who has had unsuccessful attempts for sustained weight loss. The patient presents today for a planned laparoscopic sleeve gastrectomy with upper endoscopy. We have discussed the risk and benefits of the procedure extensively preoperatively. Please see my separate notes.  PROCEDURE: After obtaining informed consent and receiving 5000 units of subcutaneous heparin, the patient was brought to the operating room at [facility] and placed supine on the operating room table. General endotracheal anesthesia was established. Sequential compression devices were placed. A orogastric tube was placed. The patient's abdomen was prepped and draped in the usual standard surgical fashion. The patient received preoperative IV antibiotic. A surgical timeout was performed. ERAS protocol used.   Access to the abdomen was achieved using a 5 mm 0 laparoscope thru a 5 mm trocar In the left upper Quadrant 2 fingerbreadths below the  left subcostal margin using the Optiview technique. Pneumoperitoneum was smoothly established up to 15 mm of mercury. The laparoscope was advanced and the abdominal cavity was surveilled. The patient was then placed in reverse Trendelenburg.   A 5 mm trocar was placed slightly above and to the left of the umbilicus under direct visualization.  The [facility] liver retractor was placed under the left lobe of the liver through a 5 mm trocar incision site in the subxiphoid position. A 5 mm trocar was placed in the lateral right upper quadrant along with a 15 mm trocar in the mid right abdomen. A final 5 mm trocar was placed in the lateral LUQ.  All under direct visualization after exparel had been infiltrated in bilateral lateral upper abdominal walls as a TAP block.  The stomach was inspected. It was completely decompressed and the orogastric tube was removed.  There was no anterior dimple that was obviously visible. His preop UGI showed no hiatal hernia.  We identified the pylorus and measured 6 cm proximal to the pylorus and identified an area of where we would start taking down the short gastric vessels. Harmonic scalpel was used to take down the short gastric vessels along the greater curvature of the stomach. We were able to enter the lesser sac. We continued to march along the greater curvature of the stomach taking down the short gastrics. As we approached the gastrosplenic ligament we took care in this area not to injure the spleen. We were able to take down the entire gastrosplenic ligament. We then mobilized the fundus away from the left crus of diaphragm. There were not any significant posterior gastric avascular attachments. This  left the stomach completely mobilized. No vessels had been taken down along the lesser curvature of the stomach.  We then reidentified the pylorus. A 40Fr ViSiGi was then placed in the oropharynx and advanced down into the stomach and placed in the distal antrum and  positioned along the lesser curvature. It was placed under suction which secured the 40Fr ViSiGi in place along the lesser curve. Then using the Ethicon echelon 60 mm stapler with a green load with Seamguard, I placed a stapler along the antrum approximately 5 cm from the pylorus. The stapler was angled so that there is ample room at the angularis incisura. I then fired the first staple load after inspecting it posteriorly to ensure adequate space both anteriorly and posteriorly. At this point I still was not completely past the angularis so with another green load with Seamguard, I placed the stapler in position just inside the prior stapleline. We then rotated the stomach to insure that there was adequate anteriorly as well as posteriorly. The stapler was then fired.  At this point I started using 60 mm gold load staple cartridge with Seamguard x 1. The echelon stapler was then repositioned with a 60 mm blue load with Seamguard and we continued to march up along the [place]. My assistant was holding traction along the greater curvature stomach along the cauterized short gastric vessels ensuring that the stomach was symmetrically retracted. Prior to each firing of the staple, we rotated the stomach to ensure that there is adequate stomach left.  As we approached the fundus, I used 60 mm blue cartridge with Seamguard aiming  lateral to the GE junction after mobilizing some of the esophageal fat pad.  The sleeve was inspected. There is no evidence of cork screw. The staple line appeared hemostatic. There was 1 area of about 1 cm along the 2nd staple line where there was a little ooze so I placed 3 clips and there was excellent hemostasis. The CRNA inflated the ViSiGi to the green zone and the upper abdomen was flooded with saline. There were no bubbles. The sleeve was decompressed and the ViSiGi removed. My assistant scrubbed out and performed an upper endoscopy. The sleeve easily distended with air and the scope was  easily advanced to the pylorus. There is no evidence of internal bleeding or cork screwing. There was no narrowing at the angularis. There is no evidence of bubbles. Please see her operative note for further details. The gastric sleeve was decompressed and the endoscope was removed.  The greater curvature the stomach was grasped with a laparoscopic grasper and removed from the 15 mm trocar site.  The liver retractor was removed. I then closed the 15 mm trocar site with 1 interrupted 0 Vicryl sutures through the fascia using the endoclose. The closure was viewed laparoscopically and it was airtight. Remaining Exparel was then infiltrated in the preperitoneal spaces around the trocar sites. Pneumoperitoneum was released. All trocar sites were closed with a 4-0 Monocryl in a subcuticular fashion followed by the application of benzoin, steri-strips, and bandaids. The patient was extubated and taken to the recovery room in stable condition. All needle, instrument, and sponge counts were correct x2. There are no immediate complications  (2) 60 mm green with Seamguard (1) 60 mm gold with seamguard (3) 60 mm blue with seamguard  PLAN OF CARE: Admit to inpatient   PATIENT DISPOSITION:  PACU - hemodynamically stable.   Delay start of Pharmacological VTE agent (>24hrs) due to surgical blood loss or  risk of bleeding:  no  Leighton Ruff. Redmond Pulling, MD, FACS FASMBS General, Bariatric, & Minimally Invasive Surgery [facility], [place]

## 2020-09-05 NOTE — H&P (Signed)
CC: here for surgery  Requesting provider: n/a  HPI: Raymond Ryan is an 54 y.o. male who is here for for laparoscopic sleeve gastrectomy with upper endoscopy.  Patient is a patient of Dr. Hassell Done however he is out on medical leave and the patient wanted to proceed with the surgery date so his care was switched to me.  I contacted the patient via phone last week and discussed surgery and offered him an in person appointment he declined.  He denies any medical changes since he was last seen by Dr. Hassell Done on August 25.  He denies any chest pain, chest pressure, shortness of breath, dyspnea on exertion.  He denies any TIAs or amaurosis fugax.  His preoperative upper GI showed no evidence of hiatal hernia.  Past Medical History:  Diagnosis Date  . Charcot foot due to diabetes mellitus ([place])   . Constipation    sometimes soft, some hard -- does go daily - are on fiber   . Diabetes mellitus   . GERD (gastroesophageal reflux disease)   . Headache   . Hyperlipidemia   . Hypertension   . Neuromuscular disorder ([place])    neuropathy    Past Surgical History:  Procedure Laterality Date  . UPPER GASTROINTESTINAL ENDOSCOPY  2002   Dr Collene Mares     Family History  Problem Relation Age of Onset  . Diabetes Mellitus II Mother   . CAD Mother   . Prostate cancer Father   . Prostate cancer Maternal Uncle   . Prostate cancer Maternal Uncle   . Colon cancer Neg Hx   . Colon polyps Neg Hx     Social:  reports that he has never smoked. He has never used smokeless tobacco. He reports that he does not drink alcohol and does not use drugs.  Allergies: No Known Allergies  Medications: I have reviewed the patient's current medications.  Results for orders placed or performed during the hospital encounter of 09/05/20 (from the past 48 hour(s))  Glucose, capillary     Status: Abnormal   Collection Time: 09/05/20  5:46 AM  Result Value Ref Range   Glucose-Capillary 133 (H) 70 - 99 mg/dL    Comment:  Glucose reference range applies only to samples taken after fasting for at least 8 hours.   Comment 1 Notify RN    Comment 2 Document in Chart     No results found.  ROS - all of the below systems have been reviewed with the patient and positives are indicated with bold text General: chills, fever or night sweats Eyes: blurry vision or double vision ENT: epistaxis or sore throat Allergy/Immunology: itchy/watery eyes or nasal congestion Hematologic/Lymphatic: bleeding problems, blood clots or swollen lymph nodes Endocrine: temperature intolerance or unexpected weight changes Breast: new or changing breast lumps or nipple discharge Resp: cough, shortness of breath, or wheezing CV: chest pain or dyspnea on exertion GI: as per HPI GU: dysuria, trouble voiding, or hematuria MSK: joint pain or joint stiffness Neuro: TIA or stroke symptoms Derm: pruritus and skin lesion changes Psych: anxiety and depression  PE Blood pressure (!) 146/85, pulse 96, temperature 98 F (36.7 C), temperature source Oral, resp. rate 17, height 6' 3.5" (1.918 m), weight 133.1 kg, SpO2 100 %. Constitutional: NAD; conversant; no deformities Eyes: Moist conjunctiva; no lid lag; anicteric; PERRL Neck: Trachea midline; no thyromegaly Lungs: Normal respiratory effort; no tactile fremitus CV: RRR; no palpable thrills; no pitting edema GI: Abd soft, nt, nd; no palpable hepatosplenomegaly MSK:  Normal gait; no clubbing/cyanosis Psychiatric: Appropriate affect; alert and oriented x3 Lymphatic: No palpable cervical or axillary lymphadenopathy Skin:no rash/lesions  Results for orders placed or performed during the hospital encounter of 09/05/20 (from the past 48 hour(s))  Glucose, capillary     Status: Abnormal   Collection Time: 09/05/20  5:46 AM  Result Value Ref Range   Glucose-Capillary 133 (H) 70 - 99 mg/dL    Comment: Glucose reference range applies only to samples taken after fasting for at least 8 hours.    Comment 1 Notify RN    Comment 2 Document in Chart     No results found.  Imaging:   A/P: Raymond Ryan is an 54 y.o. male with  obesity DM 2 HTN  CKD GERD Mild OSA Charcot foot  We again had a long discussion regarding weight loss surgery.  We rediscussed the typical postoperative course.  We did discuss sleeve gastrectomy versus gastric bypass.  We discussed potential increased risk of GERD after sleeve gastrectomy.  We rediscussed pros and cons of sleeve gastrectomy versus gastric bypass.  Given his chronic kidney disease and the longevity of his diabetes mellitus he may have comparable outcome with respect to his diabetes with respect to sleeve gastrectomy versus Roux-en-Y gastric bypass when performing the individualized metabolic surgery score.  Therefore sleeve gastrectomy may be a safer procedure for him given his chronic kidney disease.  He wants to proceed with sleeve gastrectomy.  All of his questions were asked and answered  IV antibiotic Enhanced recovery protocol Subcutaneous heparin preoperatively  Raymond Ryan. Redmond Pulling, MD, FACS General, Bariatric, & Minimally Invasive Surgery [facility], [place]

## 2020-09-05 NOTE — Progress Notes (Signed)
Patient alert and oriented, operative day.  Provided support and encouragement.  Encouraged pulmonary toilet, ambulation and small sips of liquids.  Pt complains only of sore throat pain at this point. Pt provided "a guide for pain management after your bariatric procedure".  All questions answered.  Will continue to monitor.

## 2020-09-05 NOTE — Anesthesia Procedure Notes (Signed)
Procedure Name: Intubation Date/Time: 09/05/2020 7:37 AM Performed by: Sharlette Dense, CRNA Patient Re-evaluated:Patient Re-evaluated prior to induction Oxygen Delivery Method: Circle system utilized Preoxygenation: Pre-oxygenation with 100% oxygen Induction Type: IV induction Ventilation: Mask ventilation without difficulty and Oral airway inserted - appropriate to patient size Laryngoscope Size: Glidescope and 4 Grade View: Grade I Tube type: Oral Tube size: 8.0 mm Number of attempts: 1 Airway Equipment and Method: Video-laryngoscopy and Stylet Placement Confirmation: ETT inserted through vocal cords under direct vision,  positive ETCO2 and breath sounds checked- equal and bilateral Secured at: 23 cm Tube secured with: Tape Dental Injury: Teeth and Oropharynx as per pre-operative assessment

## 2020-09-05 NOTE — Op Note (Signed)
Preoperative diagnosis: laparoscopic sleeve gastrectomy ° °Postoperative diagnosis: Same  ° °Procedure: Upper endoscopy  ° °Surgeon: Shizuo Biskup A Kierra Jezewski, M.D. ° °Anesthesia: Gen.  ° °Description of procedure: The endoscope was placed in the mouth and oropharynx and under endoscopic vision it was advanced to the esophagogastric junction which was identified at 40cm from the teeth.  The pouch was tensely insufflated while the upper abdomen was flooded with irrigation to perform a leak test, which was negative. No bubbles were seen.  The staple line was hemostatic and the lumen was evenly tubular without undue narrowing, angulation or twisting specifically at the incisura angularis. The lumen was decompressed and the scope was withdrawn without difficulty.   ° °Naiyana Barbian A Annaliyah Willig, M.D. °General, Bariatric, & Minimally Invasive Surgery °[facility] ° ° °

## 2020-09-06 ENCOUNTER — Encounter (HOSPITAL_COMMUNITY): Payer: Self-pay | Admitting: General Surgery

## 2020-09-06 LAB — CBC WITH DIFFERENTIAL/PLATELET
Abs Immature Granulocytes: 0.09 10*3/uL — ABNORMAL HIGH (ref 0.00–0.07)
Basophils Absolute: 0 10*3/uL (ref 0.0–0.1)
Basophils Relative: 0 %
Eosinophils Absolute: 0 10*3/uL (ref 0.0–0.5)
Eosinophils Relative: 0 %
HCT: 46.8 % (ref 39.0–52.0)
Hemoglobin: 15.1 g/dL (ref 13.0–17.0)
Immature Granulocytes: 1 %
Lymphocytes Relative: 14 %
Lymphs Abs: 1.3 10*3/uL (ref 0.7–4.0)
MCH: 28.7 pg (ref 26.0–34.0)
MCHC: 32.3 g/dL (ref 30.0–36.0)
MCV: 88.8 fL (ref 80.0–100.0)
Monocytes Absolute: 0.8 10*3/uL (ref 0.1–1.0)
Monocytes Relative: 9 %
Neutro Abs: 7.2 10*3/uL (ref 1.7–7.7)
Neutrophils Relative %: 76 %
Platelets: 344 10*3/uL (ref 150–400)
RBC: 5.27 MIL/uL (ref 4.22–5.81)
RDW: 14.4 % (ref 11.5–15.5)
WBC: 9.5 10*3/uL (ref 4.0–10.5)
nRBC: 0 % (ref 0.0–0.2)

## 2020-09-06 LAB — COMPREHENSIVE METABOLIC PANEL
ALT: 44 U/L (ref 0–44)
AST: 31 U/L (ref 15–41)
Albumin: 4 g/dL (ref 3.5–5.0)
Alkaline Phosphatase: 49 U/L (ref 38–126)
Anion gap: 10 (ref 5–15)
BUN: 25 mg/dL — ABNORMAL HIGH (ref 6–20)
CO2: 23 mmol/L (ref 22–32)
Calcium: 9.1 mg/dL (ref 8.9–10.3)
Chloride: 101 mmol/L (ref 98–111)
Creatinine, Ser: 1.6 mg/dL — ABNORMAL HIGH (ref 0.61–1.24)
GFR calc Af Amer: 56 mL/min — ABNORMAL LOW (ref >60)
GFR calc non Af Amer: 48 mL/min — ABNORMAL LOW (ref >60)
Glucose, Bld: 223 mg/dL — ABNORMAL HIGH (ref 70–99)
Potassium: 5.1 mmol/L (ref 3.5–5.1)
Sodium: 134 mmol/L — ABNORMAL LOW (ref 135–145)
Total Bilirubin: 0.8 mg/dL (ref 0.3–1.2)
Total Protein: 8.1 g/dL (ref 6.5–8.1)

## 2020-09-06 LAB — GLUCOSE, CAPILLARY
Glucose-Capillary: 223 mg/dL — ABNORMAL HIGH (ref 70–99)
Glucose-Capillary: 192 mg/dL — ABNORMAL HIGH (ref 70–99)
Glucose-Capillary: 155 mg/dL — ABNORMAL HIGH (ref 70–99)

## 2020-09-06 LAB — SURGICAL PATHOLOGY

## 2020-09-06 MED ORDER — ONDANSETRON 4 MG PO TBDP: 4.0000 mg | ORAL_TABLET | Freq: Four times a day (QID) | ORAL | 0 refills | Status: DC | PRN

## 2020-09-06 MED ORDER — SODIUM CHLORIDE 0.9 % IV SOLN: INTRAVENOUS | Status: DC

## 2020-09-06 MED ORDER — NOVOLOG FLEXPEN 100 UNIT/ML ~~LOC~~ SOPN: PEN_INJECTOR | SUBCUTANEOUS | 1 refills | Status: DC

## 2020-09-06 MED ORDER — TRESIBA FLEXTOUCH 200 UNIT/ML ~~LOC~~ SOPN: 20.0000 [IU] | PEN_INJECTOR | Freq: Every morning | SUBCUTANEOUS | Status: DC

## 2020-09-06 MED ORDER — PANTOPRAZOLE SODIUM 40 MG PO TBEC: 40.0000 mg | DELAYED_RELEASE_TABLET | Freq: Every day | ORAL | 0 refills | Status: DC

## 2020-09-06 MED ORDER — TRAMADOL HCL 50 MG PO TABS: 50.0000 mg | ORAL_TABLET | Freq: Four times a day (QID) | ORAL | 0 refills | Status: DC | PRN

## 2020-09-06 NOTE — Progress Notes (Signed)
1 Day Post-Op   Subjective/Chief Complaint: Some nausea with the protein shake last pm Some soreness in epigastric area - not worse with oral intake Walking   Objective: Vital signs in last 24 hours: Temp:  [97.8 F (36.6 C)-98.7 F (37.1 C)] 98.6 F (37 C) (09/08 2426) Pulse Rate:  [66-95] 66 (09/08 0637) Resp:  [14-23] 16 (09/08 0637) BP: (139-181)/(79-103) 155/87 (09/08 0637) SpO2:  [91 %-100 %] 99 % (09/08 0637) Last BM Date: 09/05/20  Intake/Output from previous day: 09/07 0701 - 09/08 0700 In: 4341.8 [P.O.:900; I.V.:3341.8; IV Piggyback:100] Out: 1975 [STMHD:6222; Blood:25] Intake/Output this shift: No intake/output data recorded.  Alert, nad Sitting in chair Eating ice symm chest rise Reg Soft, mild TTP, dressings ok +scds  Lab Results:  Recent Labs    09/05/20 1006 09/06/20 0343  WBC  --  9.5  HGB 14.4 15.1  HCT 45.4 46.8  PLT  --  344   BMET Recent Labs    09/06/20 0343  NA 134*  K 5.1  CL 101  CO2 23  GLUCOSE 223*  BUN 25*  CREATININE 1.60*  CALCIUM 9.1   PT/INR No results for input(s): LABPROT, INR in the last 72 hours. ABG No results for input(s): PHART, HCO3 in the last 72 hours.  Invalid input(s): PCO2, PO2  Studies/Results: No results found.  Anti-infectives: Anti-infectives (From admission, onward)   Start     Dose/Rate Route Frequency Ordered Stop   09/05/20 0600  cefoTEtan (CEFOTAN) 2 g in sodium chloride 0.9 % 100 mL IVPB        2 g 200 mL/hr over 30 Minutes Intravenous On call to O.R. 09/05/20 0534 09/05/20 0803      Assessment/Plan: s/p Procedure(s): LAPAROSCOPIC SLEEVE GASTRECTOMY (N/A) UPPER GI ENDOSCOPY (N/A) Principal Problem:   Obesity (BMI 30-39.9) Active Problems:   HTN (hypertension)   Type 2 diabetes mellitus with stage 2 chronic kidney disease, without long-term current use of insulin (HCC)   Charcot's joint of right foot   Hyperlipidemia   Diabetic peripheral neuropathy associated with type 2  diabetes mellitus (HCC)   CKD (chronic kidney disease) stage 3, GFR 30-59 ml/min   GERD (gastroesophageal reflux disease)   S/P laparoscopic sleeve gastrectomy  Doing well. No fever. No tachy. No wbc Cont diet as tolerated If has nausea again with reg protein shake, will try chicken soup protein shake Change IVF Needs diabetes consult for dc med recs Cont chemical vte prophylaxis Discussed importance of drinking slowly, ambulating at home, need to monitor BS at home and be in contact with his endo doctor Cr ok - at baseline  Will check later today and see how progressing  LOS: 1 day    Senay Sistrunk 09/06/2020

## 2020-09-06 NOTE — Progress Notes (Addendum)
Patient alert and oriented, pain is controlled. Patient is tolerating fluids, advanced to protein shake yesterday, patient is sipping slowly.  Reviewed Gastric sleeve discharge instructions with patient and patient is able to articulate understanding.  Provided information on BELT program, Support Group and [facility] outpatient pharmacy. All questions answered, will continue to monitor. 24 hr po intake= 900cc

## 2020-09-06 NOTE — Progress Notes (Addendum)
Nutrition Note  RD reviewed vitamin and mineral recommendations with patient per request of RN covering for Bariatric nurse coordinator.  Discussed 2 week post op diet with pt. Emphasized that liquids must be non carbonated, non caffeinated, and sugar free. Fluid goals discussed. Pt to follow up with outpatient bariatric RD for further diet progression after 2 weeks. Multivitamins and minerals also reviewed. Teach back method used, pt expressed understanding, expect good compliance.   Diet: First 2 Weeks  You will see the dietitian about two (2) weeks after your surgery. The dietitian will increase the types of foods you can eat if you are handling liquids well:  If you have severe vomiting or nausea and cannot handle clear liquids lasting longer than 1 day, call your surgeon  Protein Shake  Drink at least 2 ounces of shake 5-6 times per day  Each serving of protein shakes (usually 8 - 12 ounces) should have a minimum of:  15 grams of protein  And no more than 5 grams of carbohydrate  Goal for protein each day:  Men = 80 grams per day  Women = 60 grams per day  Protein powder may be added to fluids such as non-fat milk or Lactaid milk or Soy milk (limit to 35 grams added protein powder per serving)   Hydration  Slowly increase the amount of water and other clear liquids as tolerated (See Acceptable Fluids)  Slowly increase the amount of protein shake as tolerated  Sip fluids slowly and throughout the day  May use sugar substitutes in small amounts (no more than 6 - 8 packets per day; i.e. Splenda)   Fluid Goal  The first goal is to drink at least 8 ounces of protein shake/drink per day (or as directed by the nutritionist); some examples of protein shakes are Premier Protein, [employer], [employer], EAS Edge HP, and Unjury. See handout from pre-op Bariatric Education Class:  Slowly increase the amount of protein shake you drink as tolerated  You may find it easier to slowly sip  shakes throughout the day  It is important to get your proteins in first  Your fluid goal is to drink 64 - 100 ounces of fluid daily  It may take a few weeks to build up to this  32 oz (or more) should be clear liquids  And  32 oz (or more) should be full liquids (see below for examples)  Liquids should not contain sugar, caffeine, or carbonation   Clear Liquids:  Water or Sugar-free flavored water (i.e. Fruit H2O, Propel)  Decaffeinated coffee or tea (sugar-free)  Crystal Lite, Wyler's Lite, Minute Maid Lite  Sugar-free Jell-O  Bouillon or broth  Sugar-free Popsicle: *Less than 20 calories each; Limit 1 per day   Full Liquids:  Protein Shakes/Drinks + 2 choices per day of other full liquids  Full liquids must be:  No More Than 12 grams of Carbs per serving  No More Than 3 grams of Fat per serving  Strained low-fat cream soup  Non-Fat milk  Fat-free Lactaid Milk  Sugar-free yogurt (Dannon Lite & Fit, [place] yogurt, Oikos Zero)   Clayton Bibles, MS, RD, LDN Inpatient Clinical Dietitian Contact information available via Amion

## 2020-09-06 NOTE — Progress Notes (Signed)
Inpatient Diabetes Program Recommendations  AACE/ADA: New Consensus Statement on Inpatient Glycemic Control (2015)  Target Ranges:  Prepandial:   less than 140 mg/dL      Peak postprandial:   less than 180 mg/dL (1-2 hours)      Critically ill patients:  140 - 180 mg/dL   Lab Results  Component Value Date   GLUCAP 192 (H) 09/06/2020   HGBA1C 9.5 (H) 09/01/2020    Review of Glycemic Control  Diabetes history: DM2 Outpatient Diabetes medications: Tresiba 60 units QHS, Novolog 30 units ac supper, Ozempic 0.5 mg weekly, metformin 500 mg bid Current orders for Inpatient glycemic control: Novolog 0-20 units Q4H  HgbA1C - 9.5%283 CBGs 223, 192 mg/dL this am. Will need basal and bolus insulin when discharged.  Inpatient Diabetes Program Recommendations:   Tresiba 20 units QAM Novolog 0-20 units tidwc and hs Would leave off Ozempic and metformin. If CBGs > 180 mg/dL, would add Novolog 4 units tidwc.  Will need to f/u with Endo if blood sugars > 180 mg/dL on a consistent basis.  Instruct to check blood sugars at least 4x/day.  Thank you. Lorenda Peck, RD, LDN, CDE Inpatient Diabetes Coordinator [phone]

## 2020-09-06 NOTE — Discharge Instructions (Signed)
For blood sugars: 0 to 120           give 0 units Novolog 121 to 150       Give 3 units Novolog 151 to 200       Give 4 units Novolog 201 to 250       Give 7 unitsNovolog 251 to 300       Give  11 units Novolog 301 to 350       Give 15 units Novolog 351 to 400       Give 20 units Novolog >400                Call Doctor        GASTRIC BYPASS/SLEEVE  Home Care Instructions   These instructions are to help you care for yourself when you go home.  Call: If you have any problems. . Call [phone] and ask for the surgeon on call . If you need immediate help, come to the ER at [facility].  . Tell the ER staff that you are a new post-op gastric bypass or gastric sleeve patient   Signs and symptoms to report: . Severe vomiting or nausea o If you cannot keep down clear liquids for longer than 1 day, call your surgeon  . Abdominal pain that does not get better after taking your pain medication . Fever over 100.4 F with chills . Heart beating over 100 beats a minute . Shortness of breath at rest . Chest pain .  Redness, swelling, drainage, or foul odor at incision (surgical) sites .  If your incisions open or pull apart . Swelling or pain in calf (lower leg) . Diarrhea (Loose bowel movements that happen often), frequent watery, uncontrolled bowel movements . Constipation, (no bowel movements for 3 days) if this happens: Pick one o Milk of Magnesia, 2 tablespoons by mouth, 3 times a day for 2 days if needed o Stop taking Milk of Magnesia once you have a bowel movement o Call your doctor if constipation continues Or o Miralax  (instead of Milk of Magnesia) following the label instructions o Stop taking Miralax once you have a bowel movement o Call your doctor if constipation continues . Anything you think is not normal   Normal side effects after surgery: . Unable to sleep at night or unable to focus . Irritability or moody . Being tearful (crying) or depressed These are  common complaints, possibly related to your anesthesia medications that put you to sleep, stress of surgery, and change in lifestyle.  This usually goes away a few weeks after surgery.  If these feelings continue, call your primary care doctor.   Wound Care: You may have surgical glue, steri-strips, or staples over your incisions after surgery . Surgical glue:  Looks like a clear film over your incisions and will wear off a little at a time . Steri-strips: Strips of tape over your incisions. You may notice a yellowish color on the skin under the steri-strips. This is used to make the   steri-strips stick better. Do not pull the steri-strips off - let them fall off . Staples: Jodell Cipro may be removed before you leave the hospital o If you go home with staples, call [facility], [phone]) [phone] at for an appointment with your surgeon's nurse to have staples removed 10 days after surgery. . Showering: You may shower two (2) days after your surgery unless your surgeon tells you differently o Wash gently around incisions with warm  soapy water, rinse well, and gently pat dry  o No tub baths until staples are removed, steri-strips fall off or glue is gone.    Medications: Marland Kitchen Medications should be liquid or crushed if larger than the size of a dime . Extended release pills (medication that release a little bit at a time through the day) should NOT be crushed or cut. (examples include XL, ER, DR, SR) . Depending on the size and number of medications you take, you may need to space (take a few throughout the day)/change the time you take your medications so that you do not over-fill your pouch (smaller stomach) . Make sure you follow-up with your primary care doctor to make medication changes needed during rapid weight loss and life-style changes . If you have diabetes, follow up with the doctor that orders your diabetes medication(s) within one week after surgery and check your blood sugar  regularly. . Do not drive while taking prescription pain medication  . It is ok to take Tylenol by the bottle instructions with your pain medicine or instead of your pain medicine as needed.  DO NOT TAKE NSAIDS (EXAMPLES OF NSAIDS:  IBUPROFREN/ NAPROXEN)  Diet:                    First 2 Weeks  You will see the dietician t about two (2) weeks after your surgery. The dietician will increase the types of foods you can eat if you are handling liquids well: Marland Kitchen If you have severe vomiting or nausea and cannot keep down clear liquids lasting longer than 1 day, call your surgeon @ [phone]) Protein Shake . Drink at least 2 ounces of shake 5-6 times per day . Each serving of protein shakes (usually 8 - 12 ounces) should have: o 15 grams of protein  o And no more than 5 grams of carbohydrate  . Goal for protein each day: o Men = 80 grams per day o Women = 60 grams per day . Protein powder may be added to fluids such as non-fat milk or Lactaid milk or unsweetened Soy/Almond milk (limit to 35 grams added protein powder per serving)  Hydration . Slowly increase the amount of water and other clear liquids as tolerated (See Acceptable Fluids) . Slowly increase the amount of protein shake as tolerated  .  Sip fluids slowly and throughout the day.  Do not use straws. . May use sugar substitutes in small amounts (no more than 6 - 8 packets per day; i.e. Splenda)  Fluid Goal . The first goal is to drink at least 8 ounces of protein shake/drink per day (or as directed by the nutritionist); some examples of protein shakes are Johnson [employer] Johnson, AMR Corporation, EAS Edge HP, and Unjury. See handout from pre-op Bariatric Education Class: o Slowly increase the amount of protein shake you drink as tolerated o You may find it easier to slowly sip shakes throughout the day o It is important to get your proteins in first . Your fluid goal is to drink 64 - 100 ounces of fluid daily o It may take a few weeks to  build up to this . 32 oz (or more) should be clear liquids  And  . 32 oz (or more) should be full liquids (see below for examples) . Liquids should not contain sugar, caffeine, or carbonation  Clear Liquids: . Water or Sugar-free flavored water (i.e. Fruit H2O, Propel) . Decaffeinated coffee or tea (sugar-free) . Intel Corporation, ALLTEL Corporation  Lite, Minute Maid Lite . Sugar-free Jell-O . Bouillon or broth . Sugar-free Popsicle:   *Less than 20 calories each; Limit 1 per day  Full Liquids: Protein Shakes/Drinks + 2 choices per day of other full liquids . Full liquids must be: o No More Than 15 grams of Carbs per serving  o No More Than 3 grams of Fat per serving . Strained low-fat cream soup (except Cream of Potato or Tomato) . Non-Fat milk . Fat-free Lactaid Milk . Unsweetened Soy Or Unsweetened Almond Milk . Low Sugar yogurt (Dannon Lite & Fit, [place] yogurt; Oikos Triple Zero; Chobani Simply 100; Yoplait 100 calorie [place] - No Fruit on the Bottom)    Vitamins and Minerals . Start 1 day after surgery unless otherwise directed by your surgeon . 2 Chewable Bariatric Specific Multivitamin / Multimineral Supplement with iron (Example: Bariatric Advantage Multi EA) . Chewable Calcium with Vitamin D-3 (Example: 3 Chewable Calcium Plus 600 with Vitamin D-3) o Take 500 mg three (3) times a day for a total of 1500 mg each day o Do not take all 3 doses of calcium at one time as it may cause constipation, and you can only absorb 500 mg  at a time  o Do not mix multivitamins containing iron with calcium supplements; take 2 hours apart . Menstruating women and those with a history of anemia (a blood disease that causes weakness) may need extra iron o Talk with your doctor to see if you need more iron . Do not stop taking or change any vitamins or minerals until you talk to your dietitian or surgeon . Your Dietitian and/or surgeon must approve all vitamin and mineral supplements   Activity and  Exercise: Limit your physical activity as instructed by your doctor.  It is important to continue walking at home.  During this time, use these guidelines: . Do not lift anything greater than ten (10) pounds for at least two (2) weeks . Do not go back to work or drive until Engineer, production says you can . You may have sex when you feel comfortable  o It is VERY important for male patients to use a reliable birth control method; fertility often increases after surgery  o All hormonal birth control will be ineffective for 30 days after surgery due to medications given during surgery a barrier method must be used. o Do not get pregnant for at least 18 months . Start exercising as soon as your doctor tells you that you can o Make sure your doctor approves any physical activity . Start with a simple walking program . Walk 5-15 minutes each day, 7 days per week.  . Slowly increase until you are walking 30-45 minutes per day Consider joining our [place] program. [phone] or email [email or web address]   Special Instructions Things to remember: . Use your CPAP when sleeping if this applies to you  . [employer] [facility] has two free Bariatric Surgery Support Groups that meet monthly o The 3rd Thursday of each month, 6 pm, [facility]  o The 2nd Friday of each month, 11:45 am in the private dining room in the basement of [facility] [employer] . It is very important to keep all follow up appointments with your surgeon, dietitian, primary care physician, and behavioral health practitioner . Routine follow up schedule with your surgeon include appointments at 2-3 weeks, 6-8 weeks, 6 months, and 1 year at a minimum.  Your surgeon may request to see you more often.  o After the first year, please follow up with your bariatric surgeon and dietitian at least once a year in order to maintain best weight loss results [facility]: [place]: [phone] Bariatric Nurse Coordinator: [phone]      Reviewed and Endorsed  by [facility] Patient Education Committee, June, 2016 Edits Approved: Aug, 2018

## 2020-09-08 NOTE — Discharge Summary (Signed)
Physician Discharge Summary  Raymond Ryan [id number] DOB: 1966-01-30 DOA: 09/05/2020  PCP: Biagio Borg, MD  Admit date: 09/05/2020 Discharge date: 09/06/2020  Recommendations for Outpatient Follow-up:    Follow-up Information    Johnathan Hausen, MD Follow up on 10/06/2020.   Specialty: General Surgery Why: @ 2:30pm w/ Dr. Carter Kitten information: [address] [place] [address] [phone]        Johnathan Hausen, MD .   Specialty: General Surgery Contact information: [address]  [place] [address] [phone]              Discharge Diagnoses:  Principal Problem:   Obesity (BMI 30-39.9) Active Problems:   HTN (hypertension)   Type 2 diabetes mellitus with stage 2 chronic kidney disease, without long-term current use of insulin (HCC)   Charcot's joint of right foot   Hyperlipidemia   Diabetic 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 (HCC)   CKD (chronic kidney disease) stage 3, GFR 30-59 ml/min   GERD (gastroesophageal reflux disease)   S/P laparoscopic sleeve gastrectomy   Surgical Procedure: Laparoscopic Sleeve Gastrectomy, upper endoscopy  Discharge Condition: Good Disposition: Home  Diet recommendation: Postoperative sleeve gastrectomy diet (liquids only)  Filed Weights   09/05/20 0549 09/05/20 0607  Weight: 133.1 kg 133.1 kg     Hospital Course:  The patient was admitted for a planned laparoscopic sleeve gastrectomy. Please see operative note. Preoperatively the patient was given 5000 units of subcutaneous heparin for DVT prophylaxis. Postoperative prophylactic Lovenox dosing was started on the evening of postoperative day 0. ERAS protocol was used. On the evening of postoperative day 0, the patient was started on water and ice chips. On postoperative day 1 the patient had no fever or tachycardia and was tolerating water in their diet was gradually advanced throughout the day. The patient was  ambulating without difficulty. Their vital signs are stable without fever or tachycardia. Their hemoglobin had remained stable. He had a diabetes mgmt consult with discharge medication recommendations.  The patient had received discharge instructions and counseling. They were deemed stable for discharge and had met discharge criteria   Discharge Instructions  Discharge Instructions    Ambulate hourly while awake   Complete by: As directed    Call MD for:  difficulty breathing, headache or visual disturbances   Complete by: As directed    Call MD for:  persistant dizziness or light-headedness   Complete by: As directed    Call MD for:  persistant nausea and vomiting   Complete by: As directed    Call MD for:  redness, tenderness, or signs of infection (pain, swelling, redness, odor or green/yellow discharge around incision site)   Complete by: As directed    Call MD for:  severe uncontrolled pain   Complete by: As directed    Call MD for:  temperature >101 F   Complete by: As directed    Diet bariatric full liquid   Complete by: As directed    Discharge instructions   Complete by: As directed    See bariatric discharge instructions   Incentive spirometry   Complete by: As directed    Perform hourly while awake     Allergies as of 09/06/2020   No Known Allergies     Medication List    STOP taking these medications   acetaminophen 500 MG tablet Commonly known as: TYLENOL   metFORMIN 500 MG tablet Commonly known as: GLUCOPHAGE  Ozempic (0.25 or 0.5 MG/DOSE) 2 MG/1.5ML Sopn Generic drug: Semaglutide(0.25 or 0.5MG /DOS)     TAKE these medications   amLODipine 5 MG tablet Commonly known as: NORVASC Take 1 tablet (5 mg total) by mouth daily. Notes to patient: Monitor Blood Pressure Daily and keep a log for primary care physician.  You may need to make changes to your medications with rapid weight loss.     gabapentin 300 MG capsule Commonly known as: NEURONTIN Take 1  capsule (300 mg total) by mouth at bedtime as needed. What changed:   when to take this  reasons to take this   Insulin Pen Needle 31G X 5 MM Misc Use for insulin 4 times a day Notes to patient: Monitor Blood Sugar Frequently and keep a log for primary care physician, you may need to adjust medication dosage with rapid weight loss.     Insulin Syringes (Disposable) U-100 0.3 ML Misc Inject 1 each into the skin 3 (three) times daily. Notes to patient: Monitor Blood Sugar Frequently and keep a log for primary care physician, you may need to adjust medication dosage with rapid weight loss.     lisinopril-hydrochlorothiazide 20-25 MG tablet Commonly known as: ZESTORETIC Take 1 tablet by mouth daily. Notes to patient: Monitor Blood Pressure Daily and keep a log for primary care physician.  Monitor for symptoms of dehydration.  You may need to make changes to your medications with rapid weight loss.     multivitamin with minerals Tabs tablet Take 1 tablet by mouth daily.   NovoLOG FlexPen 100 UNIT/ML FlexPen Generic drug: insulin aspart Inject 0-20 units three times a day with meals & at night  See sliding scale in discharge instructions What changed: See the new instructions.   ondansetron 4 MG disintegrating tablet Commonly known as: ZOFRAN-ODT Take 1 tablet (4 mg total) by mouth every 6 (six) hours as needed for nausea or vomiting.   OneTouch Verio Flex System w/Device Kit by Does not apply route.   OneTouch Verio test strip Generic drug: glucose blood USE AS INSTRUCTED TO CHECK BLOOD SUGAR THREE TIMES DAILY..   pantoprazole 40 MG tablet Commonly known as: PROTONIX Take 1 tablet (40 mg total) by mouth daily.   traMADol 50 MG tablet Commonly known as: ULTRAM Take 1 tablet (50 mg total) by mouth every 6 (six) hours as needed (pain).   Tyler Aas FlexTouch 200 UNIT/ML FlexTouch Pen Generic drug: insulin degludec Inject 20 Units into the skin every morning. What changed: See  the new instructions.       Follow-up Information    Johnathan Hausen, MD Follow up on 10/06/2020.   Specialty: General Surgery Why: @ 2:30pm w/ Dr. Carter Kitten information: [address] [place] [address] [phone]        Johnathan Hausen, MD .   Specialty: General Surgery Contact information: [place] [address] [phone]                The results of significant diagnostics from this hospitalization (including imaging, microbiology, ancillary and laboratory) are listed below for reference.    Significant Diagnostic Studies: No results found.  Labs: Basic Metabolic Panel: Recent Labs  Lab 09/01/20 1130 09/06/20 0343  NA 139 134*  K 4.4 5.1  CL 102 101  CO2 28 23  GLUCOSE 226* 223*  BUN 21* 25*  CREATININE 1.54* 1.60*  CALCIUM 9.4 9.1   Liver Function Tests: Recent Labs  Lab 09/01/20  1130 09/06/20 0343  AST 19 31  ALT 23 44  ALKPHOS 62 49  BILITOT 0.5 0.8  PROT 7.6 8.1  ALBUMIN 4.1 4.0    CBC: Recent Labs  Lab 09/01/20 1130 09/05/20 1006 09/06/20 0343  WBC 7.6  --  9.5  NEUTROABS 5.0  --  7.2  HGB 15.2 14.4 15.1  HCT 47.6 45.4 46.8  MCV 88.3  --  88.8  PLT 373  --  344    CBG: Recent Labs  Lab 09/05/20 1959 09/05/20 2357 09/06/20 0343 09/06/20 0754 09/06/20 1155  GLUCAP 283* 250* 223* 192* 155*    Principal Problem:   Obesity (BMI 30-39.9) Active Problems:   HTN (hypertension)   Type 2 diabetes mellitus with stage 2 chronic kidney disease, without long-term current use of insulin (HCC)   Charcot's joint of right foot   Hyperlipidemia   Diabetic 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 (HCC)   CKD (chronic kidney disease) stage 3, GFR 30-59 ml/min   GERD (gastroesophageal reflux disease)   S/P laparoscopic sleeve gastrectomy   Time coordinating discharge: 15 min  Signed:  Gayland Curry, MD [facility],  [place] [phone] 09/08/2020, 11:24 AM

## 2020-09-11 ENCOUNTER — Telehealth (HOSPITAL_COMMUNITY): Payer: Self-pay

## 2020-09-11 NOTE — Telephone Encounter (Signed)
Patient called to discuss post bariatric surgery follow up questions.  See below:   1.  Tell me about your pain and pain management?denies  2.  Let's talk about fluid intake.  How much total fluid are you taking in? 64 ounces of fluid  3.  How much protein have you taken in the last 2 days? 90 grams  4.  Have you had nausea?  Tell me about when have experienced nausea and what you did to help?nausea medication used and works using every once in a while  5.  Has the frequency or color changed with your urine? Light in color  6.  Tell me what your incisions look like?no problems  7.  Have you been passing gas? BM?had bm since discharge  8.  If a problem or question were to arise who would you call?  Do you know contact numbers for [place], [facility], and [facility]?aware of how to contact   9.  How has the walking going?walking regularly  10.  How are your vitamins and calcium going?  How are you taking them?mvi and calcium

## 2020-09-19 ENCOUNTER — Encounter: Payer: BC Managed Care – PPO | Attending: Surgery | Admitting: Skilled Nursing Facility1

## 2020-09-19 ENCOUNTER — Other Ambulatory Visit: Payer: Self-pay

## 2020-09-19 DIAGNOSIS — N183 Chronic kidney disease, stage 3 unspecified: Secondary | ICD-10-CM

## 2020-09-20 NOTE — Progress Notes (Signed)
2 Week Post-Operative Nutrition Class   Patient was seen on 02/23/19 for Post-Operative Nutrition education at [facility].   Checking sugars 3 times a say: A1C 9 numbers 137-198 (pt was question how it is possible to have a high blood sugar without eating sugar: dietitian educated pt on this topic) Pt states he is learning how slow to [place] due to getting sick when going too fast.    Surgery date: 09/05/2020 Surgery type: sleeve Start weight at [facility]: 286.9 Weight today: 277.3   Body Composition Scale 09/19/2020  Total Body Fat % 30.5  Visceral Fat 21  Fat-Free Mass % 69.4   Total Body Water % 50.4   Muscle-Mass lbs 52.3  Body Fat Displacement          Torso  lbs 52.6         Left Leg  lbs 10.5         Right Leg  lbs 10.5         Left Arm  lbs 5.2         Right Arm   lbs 5.2     The following the learning objectives were met by the patient during this course:  Identifies Phase 3 (Soft, High Proteins) Dietary Goals and will begin from 2 weeks post-operatively to 2 months post-operatively  Identifies appropriate sources of fluids and proteins   States protein recommendations and appropriate sources post-operatively  Identifies the need for appropriate texture modifications, mastication, and bite sizes when consuming solids  Identifies appropriate multivitamin and calcium sources post-operatively  Describes the need for physical activity post-operatively and will follow MD recommendations  States when to call healthcare provider regarding medication questions or post-operative complications   Handouts given during class include:  Phase 3A: Soft, High Protein Diet Handout   Follow-Up Plan: Patient will follow-up at [facility] in 6 weeks for 2 month post-op nutrition visit for diet advancement per MD.

## 2020-09-22 ENCOUNTER — Other Ambulatory Visit: Payer: Self-pay

## 2020-09-22 ENCOUNTER — Other Ambulatory Visit (INDEPENDENT_AMBULATORY_CARE_PROVIDER_SITE_OTHER): Payer: BC Managed Care – PPO

## 2020-09-22 DIAGNOSIS — E1165 Type 2 diabetes mellitus with hyperglycemia: Secondary | ICD-10-CM

## 2020-09-22 DIAGNOSIS — Z794 Long term (current) use of insulin: Secondary | ICD-10-CM | POA: Diagnosis not present

## 2020-09-22 LAB — BASIC METABOLIC PANEL
BUN: 37 mg/dL — ABNORMAL HIGH (ref 6–23)
CO2: 28 mEq/L (ref 19–32)
Calcium: 10.6 mg/dL — ABNORMAL HIGH (ref 8.4–10.5)
Chloride: 101 mEq/L (ref 96–112)
Creatinine, Ser: 1.85 mg/dL — ABNORMAL HIGH (ref 0.40–1.50)
GFR: 46.48 mL/min — ABNORMAL LOW (ref >60.00)
Glucose, Bld: 185 mg/dL — ABNORMAL HIGH (ref 70–99)
Potassium: 4.4 mEq/L (ref 3.5–5.1)
Sodium: 138 mEq/L (ref 135–145)

## 2020-09-22 LAB — HEMOGLOBIN A1C: Hgb A1c MFr Bld: 10.2 % — ABNORMAL HIGH (ref 4.6–6.5)

## 2020-09-25 ENCOUNTER — Telehealth: Payer: Self-pay | Admitting: Skilled Nursing Facility1

## 2020-09-25 ENCOUNTER — Other Ambulatory Visit: Payer: Self-pay

## 2020-09-25 ENCOUNTER — Ambulatory Visit: Payer: BC Managed Care – PPO | Admitting: Endocrinology

## 2020-09-25 NOTE — Telephone Encounter (Signed)
RD called pt to verify fluid intake once starting soft, solid proteins 2 week post-bariatric surgery.   Daily Fluid intake: 64+ Daily Protein intake: 80+  Concerns/issues:   Pt reports no issues.

## 2020-09-26 ENCOUNTER — Other Ambulatory Visit: Payer: Self-pay

## 2020-09-26 ENCOUNTER — Ambulatory Visit: Payer: BC Managed Care – PPO | Admitting: Endocrinology

## 2020-09-26 ENCOUNTER — Ambulatory Visit (INDEPENDENT_AMBULATORY_CARE_PROVIDER_SITE_OTHER): Payer: BC Managed Care – PPO | Admitting: Internal Medicine

## 2020-09-26 ENCOUNTER — Encounter: Payer: Self-pay | Admitting: Internal Medicine

## 2020-09-26 VITALS — BP 128/84 | HR 107 | Ht 75.5 in | Wt 279.4 lb

## 2020-09-26 VITALS — BP 120/82 | HR 103 | Temp 99.4°F | Ht 75.5 in | Wt 278.0 lb

## 2020-09-26 DIAGNOSIS — Z794 Long term (current) use of insulin: Secondary | ICD-10-CM

## 2020-09-26 DIAGNOSIS — E1165 Type 2 diabetes mellitus with hyperglycemia: Secondary | ICD-10-CM | POA: Diagnosis not present

## 2020-09-26 DIAGNOSIS — Z0000 Encounter for general adult medical examination without abnormal findings: Secondary | ICD-10-CM

## 2020-09-26 DIAGNOSIS — N183 Chronic kidney disease, stage 3 unspecified: Secondary | ICD-10-CM | POA: Diagnosis not present

## 2020-09-26 DIAGNOSIS — K219 Gastro-esophageal reflux disease without esophagitis: Secondary | ICD-10-CM

## 2020-09-26 DIAGNOSIS — N179 Acute kidney failure, unspecified: Secondary | ICD-10-CM | POA: Diagnosis not present

## 2020-09-26 DIAGNOSIS — Z1159 Encounter for screening for other viral diseases: Secondary | ICD-10-CM

## 2020-09-26 DIAGNOSIS — I10 Essential (primary) hypertension: Secondary | ICD-10-CM | POA: Diagnosis not present

## 2020-09-26 DIAGNOSIS — Z9884 Bariatric surgery status: Secondary | ICD-10-CM

## 2020-09-26 DIAGNOSIS — N189 Chronic kidney disease, unspecified: Secondary | ICD-10-CM

## 2020-09-26 MED ORDER — OZEMPIC (0.25 OR 0.5 MG/DOSE) 2 MG/1.5ML ~~LOC~~ SOPN: 0.5000 mg | PEN_INJECTOR | SUBCUTANEOUS | 2 refills | Status: DC

## 2020-09-26 MED ORDER — LISINOPRIL-HYDROCHLOROTHIAZIDE 20-25 MG PO TABS
0.5000 | ORAL_TABLET | Freq: Every day | ORAL | 3 refills | Status: DC
Start: 2020-09-26 — End: 2020-10-24

## 2020-09-26 NOTE — Progress Notes (Signed)
Patient ID: Raymond Ryan, male   DOB: [birth year], 54 y.o.   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 .  NovoLog 0-20 units before meals   Non-insulin hypoglycemic drugs the patient is taking are:   Ozempic 0.5 mg weekly  His A1c is 10.2 and progressively higher  Current management, blood sugar patterns and problems identified:  He was last seen in 12/20  He just had a gastric sleeve procedure on 09/06/2020  He says his sugars were high in the hospital but he was sent home only on a sliding scale NovoLog insulin  His blood sugars have been overall high since his discharge and averaging 172 over the last 2-3 weeks or so  He is checking his blood sugars somewhat infrequently  He says he is taking again 20 units of NovoLog and most of his evening meals but without checking his sugar  Last night he felt a little hypoglycemic at 2 AM of the blood sugar was only 120  On his own he started taking Metformin 500 mg a day because of constipation  Overall FASTING blood sugars are trending lower in the last week or so normal still high at 185 in the lab  He has stopped Ozempic sometime ago  Currently he is on a protein shake diet and only small portions  without much carbohydrate  He says that he is also trying to drink more water        Side effects from medications have been: Diarrhea from regular metformin     Typical meal intake: Breakfast is oatmeal or cereal or egg sandwich, lunch sometimes fast food, dinner salad and sandwich                Glucose monitoring:  done 1-2 times a day         Glucometer:  Freestyle neo.       Blood Glucose readings by time of day as below   PRE-MEAL Fasting Lunch Dinner Bedtime Overall  Glucose range:  147-202  135-236  102-249   66-257  Mean/median:  175  186    172   POST-MEAL PC Breakfast PC Lunch PC Dinner  Glucose range:   198  66-206  Mean/median:    168    Previous data:  PRE-MEAL Fasting Lunch Dinner Bedtime Overall  Glucose range:  124-176   114-141    Mean/median:     ?   POST-MEAL PC Breakfast PC Lunch PC Dinner  Glucose range:  200    Mean/median:         Dietician visit, most recent: Never  Weight history:  Wt Readings from Last 3 Encounters:  09/26/20 279 lb 6.4 oz (126.7 kg)  09/20/20 277 lb 4.8 oz (125.8 kg)  09/05/20 293 lb 6.4 oz (133.1 kg)    Glycemic control:   Lab Results  Component Value Date   HGBA1C 10.2 (H) 09/22/2020   HGBA1C 9.5 (H) 09/01/2020   HGBA1C 7.8 (H) 12/27/2019   Lab Results  Component Value Date   MICROALBUR 7.6 (H) 12/27/2019   LDLCALC 52 08/30/2019   CREATININE 1.85 (H) 09/22/2020   Lab Results  Component Value Date   MICRALBCREAT 5.6 12/27/2019    Lab Results  Component Value Date   FRUCTOSAMINE 325 (H) 08/30/2019   FRUCTOSAMINE 333 (H) 12/16/2018    Lab on 09/22/2020  Component Date Value Ref Range Status  . Sodium 09/22/2020 138  135 - 145 mEq/L Final  . Potassium 09/22/2020 4.4  3.5 - 5.1 mEq/L Final  . Chloride 09/22/2020 101  96 - 112 mEq/L Final  . CO2 09/22/2020 28  19 - 32 mEq/L Final  . Glucose, Bld 09/22/2020 185* 70 - 99 mg/dL Final  . BUN 09/22/2020 37* 6 - 23 mg/dL Final  . Creatinine, Ser  09/22/2020 1.85* 0.40 - 1.50 mg/dL Final  . GFR 09/22/2020 46.48* >60.00 mL/min Final  . Calcium 09/22/2020 10.6* 8.4 - 10.5 mg/dL Final  . Hgb A1c MFr Bld 09/22/2020 10.2* 4.6 - 6.5 % Final   Glycemic Control Guidelines for People with Diabetes:Non Diabetic:  <6%Goal of Therapy: <7%Additional Action Suggested:  >8%     Allergies as of 09/26/2020   No Known Allergies     Medication List       Accurate as of September 26, 2020  9:05 AM. If you have any questions, ask your nurse or doctor.        amLODipine 5 MG tablet Commonly known as: NORVASC Take 1 tablet (5 mg total) by mouth daily.   gabapentin 300 MG capsule Commonly known as: NEURONTIN Take 1 capsule (300 mg total) by mouth at bedtime as needed. What changed:   when to take this  reasons to take this   Insulin Pen Needle 31G X 5 MM Misc Use for insulin 4 times a day   Insulin Syringes (Disposable) U-100 0.3 ML Misc Inject 1 each into the skin 3 (three) times daily.   lisinopril-hydrochlorothiazide 20-25 MG tablet Commonly known as: ZESTORETIC Take 1 tablet by mouth daily.   multivitamin with minerals Tabs tablet Take 1 tablet by mouth daily.   NovoLOG FlexPen 100 UNIT/ML FlexPen Generic drug: insulin aspart Inject 0-20 units three times a day with meals & at night  See sliding scale in discharge instructions   ondansetron 4 MG disintegrating tablet Commonly known as: ZOFRAN-ODT Take 1 tablet (4 mg total) by mouth every 6 (six) hours as needed for nausea or vomiting.   OneTouch Verio Flex System w/Device Kit by Does not apply route.   OneTouch Verio test strip Generic drug: glucose blood USE AS INSTRUCTED TO CHECK BLOOD SUGAR THREE TIMES DAILY..   pantoprazole 40 MG tablet Commonly known as: PROTONIX Take 1 tablet (40 mg total) by mouth daily.   traMADol 50 MG tablet Commonly known as: ULTRAM Take 1 tablet (50 mg total) by mouth every 6 (six) hours as needed (pain).   Tyler Aas FlexTouch 200  UNIT/ML FlexTouch Pen Generic drug: insulin degludec Inject 20 Units into the skin every morning.       Allergies: No Known Allergies  Past  Medical History:  Diagnosis Date  . Charcot foot due to diabetes mellitus ([place])   . Constipation    sometimes soft, some hard -- does go daily - are on fiber   . Diabetes mellitus   . GERD (gastroesophageal reflux disease)   . Headache   . Hyperlipidemia   . Hypertension   . Neuromuscular disorder ([place])    neuropathy    Past Surgical History:  Procedure Laterality Date  . LAPAROSCOPIC GASTRIC SLEEVE RESECTION N/A 09/05/2020   Procedure: LAPAROSCOPIC SLEEVE GASTRECTOMY;  Surgeon: Greer Pickerel, MD;  Location: [facility];  Service: General;  Laterality: N/A;  . UPPER GASTROINTESTINAL ENDOSCOPY  2002   Dr Collene Mares   . UPPER GI ENDOSCOPY N/A 09/05/2020   Procedure: UPPER GI ENDOSCOPY;  Surgeon: Greer Pickerel, MD;  Location: [facility];  Service: General;  Laterality: N/A;    Family History  Problem Relation Age of Onset  . Diabetes Mellitus II Mother   . CAD Mother   . Prostate cancer Father   . Prostate cancer Maternal Uncle   . Prostate cancer Maternal Uncle   . Colon cancer Neg Hx   . Colon polyps Neg Hx     Social History:  reports that he has never smoked. He has never used smokeless tobacco. He reports that he does not drink alcohol and does not use drugs.   Review of Systems  Lipid history: Has been treated by PCP with 20 mg atorvastatin with adequate control Last lipid panel:    Lab Results  Component Value Date   CHOL 111 08/30/2019   HDL 33.90 (L) 08/30/2019   LDLCALC 52 08/30/2019   TRIG 126.0 08/30/2019   CHOLHDL 3 08/30/2019           Hypertension: Has been treated with Zestoretic This is followed by nephrologist and PCP    BP Readings from Last 3 Encounters:  09/26/20 122/82  09/06/20 (!) 145/92  09/01/20 135/85    He takes gabapentin at night from his PCP for neuropathic pain  Currently known complications of  diabetes: Neuropathy, erectile dysfunction, unknown status of retinopathy  Has a renal dysfunction of unclear etiology with variable labs  Lab Results  Component Value Date   CREATININE 1.85 (H) 09/22/2020   CREATININE 1.60 (H) 09/06/2020   CREATININE 1.54 (H) 09/01/2020     LABS:  Lab on 09/22/2020  Component Date Value Ref Range Status  . Sodium 09/22/2020 138  135 - 145 mEq/L Final  . Potassium 09/22/2020 4.4  3.5 - 5.1 mEq/L Final  . Chloride 09/22/2020 101  96 - 112 mEq/L Final  . CO2 09/22/2020 28  19 - 32 mEq/L Final  . Glucose, Bld 09/22/2020 185* 70 - 99 mg/dL Final  . BUN 09/22/2020 37* 6 - 23 mg/dL Final  . Creatinine, Ser 09/22/2020 1.85* 0.40 - 1.50 mg/dL Final  . GFR 09/22/2020 46.48* >60.00 mL/min Final  . Calcium 09/22/2020 10.6* 8.4 - 10.5 mg/dL Final  . Hgb A1c MFr Bld 09/22/2020 10.2* 4.6 - 6.5 % Final   Glycemic Control Guidelines for People with Diabetes:Non Diabetic:  <6%Goal of Therapy: <7%Additional Action Suggested:  >8%     Physical Examination:  BP 122/82 (BP Location: Left Arm, Patient Position: Sitting, Cuff Size: Normal)   Pulse (!) 107   Ht 6' 3.5" (1.918 m)   Wt 279 lb 6.4 oz (126.7 kg)   SpO2 99%   BMI 34.46 kg/m     ASSESSMENT:  Diabetes type 2 with BMI  42  See history of present illness for detailed discussion of current diabetes management, blood sugar patterns and problems identified  His A1c is recently 10.2  He previously was on basal bolus insulin and Ozempic  Since he just had his laparoscopic gastric sleeve procedure he is needing progressively lower doses of insulin and not taking any basal insulin now, previously on 50 units Tresiba His morning blood sugars are gradually improving and as low as 120 but lab glucose was 185 He is only taking sporadic doses of NovoLog but recently had hypoglycemia with this Also his carbohydrate intake is significantly lower now  Has not checked blood sugars frequently enough to get a  pattern recently    HYPERTENSION: Blood pressure is fairly well controlled, followed by nephrologist and PCP Blood pressure was checked twice today in the office and was the same  Renal dysfunction: 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 since microalbumin has been normal previously Creatinine slightly higher recently and not clear why, blood pressure does not appear to be low   PLAN:    He will start checking blood sugar more regularly 3-4 times a day to get a blood sugar pattern Discussed that he probably is going to be able to get off insulin once he has significant weight loss Since his creatinine clearance is over 40 he can likely continue with Metformin and take 500 mg twice daily Also to help with fasting blood sugars and postprandial control he will start back on Ozempic 0.25 mg weekly until his next visit Currently likely not needing NovoLog since his carbohydrate intake is very small and occasionally blood sugars are below 200 after meals which may improve with Ozempic  Needs more regular follow-up  He will discuss his renal function and blood pressure medications with nephrologist and PCP  Encouraged him to increase fluid intake  There are no Patient Instructions on file for this visit.    Elayne Snare 09/26/2020, 9:05 AM   Note: This office note was prepared with Dragon voice recognition system technology. Any transcriptional errors that result from this process are unintentional.

## 2020-09-26 NOTE — Patient Instructions (Addendum)
Check blood sugars on waking 3 -5 days a week  Also check blood sugars about 2 hours after meals and do this after different meals by rotation  Recommended blood sugar levels on waking up are 90-130 and about 2 hours after meal is 130-160  Please bring your blood sugar monitor to each visit, thank you  Metformin 2x daily  Ozempic 0.25mg  weekly

## 2020-09-26 NOTE — Patient Instructions (Addendum)
Ok to take HALF of the BP pill for now  Please continue all other medications as before, and refills have been done if requested.  Please have the pharmacy call with any other refills you may need.  Please continue your efforts at being more active, low cholesterol diet, and weight control.  You are otherwise up to date with prevention measures today.  Please keep your appointments with your specialists as you may have planned  Please go to the LAB at the blood drawing area for the tests to be done at the [place] site in 2 weeks  Please make an Appointment to return in 3 month

## 2020-09-26 NOTE — Progress Notes (Signed)
Subjective:    Patient ID: Raymond Ryan, male    DOB: [birth year], 54 y.o.   MRN: [id number]  HPI Here for wellness and f/u;  Overall doing ok;  Pt denies Chest pain, worsening SOB, DOE, wheezing, orthopnea, PND, worsening LE edema, palpitations, dizziness or syncope.  Pt denies neurological change such as new headache, facial or extremity weakness.  Pt denies polydipsia, polyuria, or low sugar symptoms. Pt states overall good compliance with treatment and medications, good tolerability, and has been trying to follow appropriate diet.  Pt denies worsening depressive symptoms, suicidal ideation or panic. No fever, night sweats, wt loss, loss of appetite, or other constitutional symptoms.  Pt states good ability with ADL's, has low fall risk, home safety reviewed and adequate, no other significant changes in hearing or vision, and only occasionally active with exercise. Wt down from 292 pre surgury.  Already off insulin per endo. Denies worsening reflux, abd pain, dysphagia, n/v, bowel change or blood. Wt Readings from Last 3 Encounters:  09/26/20 278 lb (126.1 kg)  09/26/20 279 lb 6.4 oz (126.7 kg)  09/20/20 277 lb 4.8 oz (125.8 kg)   Past Medical History:  Diagnosis Date  . Charcot foot due to diabetes mellitus ([place])   . Constipation    sometimes soft, some hard -- does go daily - are on fiber   . Diabetes mellitus   . GERD (gastroesophageal reflux disease)   . Headache   . Hyperlipidemia   . Hypertension   . Neuromuscular disorder ([place])    neuropathy   Past Surgical History:  Procedure Laterality Date  . LAPAROSCOPIC GASTRIC SLEEVE RESECTION N/A 09/05/2020   Procedure: LAPAROSCOPIC SLEEVE GASTRECTOMY;  Surgeon: Greer Pickerel, MD;  Location: [facility];  Service: General;  Laterality: N/A;  . UPPER GASTROINTESTINAL ENDOSCOPY  2002   Dr Collene Mares   . UPPER GI ENDOSCOPY N/A 09/05/2020   Procedure: UPPER GI ENDOSCOPY;  Surgeon: Greer Pickerel, MD;  Location: [facility];  Service: General;  Laterality:  N/A;    reports that he has never smoked. He has never used smokeless tobacco. He reports that he does not drink alcohol and does not use drugs. family history includes CAD in his mother; Diabetes Mellitus II in his mother; Prostate cancer in his father, maternal uncle, and maternal uncle. No Known Allergies Current Outpatient Medications on File Prior to Visit  Medication Sig Dispense Refill  . Blood Glucose Monitoring Suppl (ONETOUCH VERIO FLEX SYSTEM) w/Device KIT by Does not apply route.    . gabapentin (NEURONTIN) 300 MG capsule Take 1 capsule (300 mg total) by mouth at bedtime as needed. (Patient taking differently: Take 300 mg by mouth daily as needed (pain.). ) 90 capsule 1  . Multiple Vitamin (MULTIVITAMIN WITH MINERALS) TABS tablet Take 1 tablet by mouth daily.    Glory Rosebush VERIO test strip USE AS INSTRUCTED TO CHECK BLOOD SUGAR THREE TIMES DAILY.. 100 strip 3   No current facility-administered medications on file prior to visit.   Review of Systems All otherwise neg per pt     Objective:   Physical Exam BP 120/82 (BP Location: Left Arm, Patient Position: Sitting, Cuff Size: Large)   Pulse (!) 103   Temp 99.4 F (37.4 C) (Oral)   Ht 6' 3.5" (1.918 m)   Wt 278 lb (126.1 kg)   SpO2 98%   BMI 34.29 kg/m  VS noted,  Constitutional: Pt appears in NAD HENT: Head: NCAT.  Right Ear: External ear normal.  Left  Ear: External ear normal.  Eyes: . Pupils are equal, round, and reactive to light. Conjunctivae and EOM are normal Nose: without d/c or deformity Neck: Neck supple. Gross normal ROM Cardiovascular: Normal rate and regular rhythm.   Pulmonary/Chest: Effort normal and breath sounds without rales or wheezing.  Abd:  Soft, NT, ND, + BS, no organomegaly Neurological: Pt is alert. At baseline orientation, motor grossly intact Skin: Skin is warm. No rashes, other new lesions, no LE edema Psychiatric: Pt behavior is normal without agitation  All otherwise neg per pt Lab  Results  Component Value Date   WBC 9.5 09/06/2020   HGB 15.1 09/06/2020   HCT 46.8 09/06/2020   PLT 344 09/06/2020   GLUCOSE 185 (H) 09/22/2020   CHOL 111 08/30/2019   TRIG 126.0 08/30/2019   HDL 33.90 (L) 08/30/2019   LDLCALC 52 08/30/2019   ALT 44 09/06/2020   AST 31 09/06/2020   NA 138 09/22/2020   K 4.4 09/22/2020   CL 101 09/22/2020   CREATININE 1.85 (H) 09/22/2020   BUN 37 (H) 09/22/2020   CO2 28 09/22/2020   TSH 1.59 07/15/2019   PSA 1.31 07/15/2019   HGBA1C 10.2 (H) 09/22/2020   MICROALBUR 7.6 (H) 12/27/2019       Assessment & Plan:

## 2020-10-01 ENCOUNTER — Encounter: Payer: Self-pay | Admitting: Internal Medicine

## 2020-10-01 DIAGNOSIS — N189 Chronic kidney disease, unspecified: Secondary | ICD-10-CM | POA: Insufficient documentation

## 2020-10-01 DIAGNOSIS — N179 Acute kidney failure, unspecified: Secondary | ICD-10-CM | POA: Insufficient documentation

## 2020-10-01 NOTE — Assessment & Plan Note (Addendum)
Mild worsening over baseline ckd 3 - for increased po fluids, and f/u lab 2 wks  I spent 21 minutes in preparing to see the patient by review of recent labs, imaging and procedures, obtaining and reviewing separately obtained history, communicating with the patient and family or caregiver, ordering medications, tests or procedures, and documenting clinical information in the EHR including the differential Dx, treatment, and any further evaluation and other management of AKI on ckd, htn DM, gerd, s/p gastric sleeve

## 2020-10-01 NOTE — Assessment & Plan Note (Signed)
stable overall by history and exam, recent data reviewed with pt, and pt to continue medical treatment as before,  to f/u any worsening symptoms or concerns  

## 2020-10-01 NOTE — Assessment & Plan Note (Signed)
Ok for HALF dose med to avoid lower BP - zestoretic 20-25 1/2 qd

## 2020-10-01 NOTE — Assessment & Plan Note (Signed)
With recent wt loss now off insulin, cont f/u endo

## 2020-10-01 NOTE — Assessment & Plan Note (Signed)

## 2020-10-01 NOTE — Assessment & Plan Note (Signed)
O/w doing well, to f/u general sugury

## 2020-10-19 ENCOUNTER — Other Ambulatory Visit: Payer: Self-pay

## 2020-10-19 ENCOUNTER — Other Ambulatory Visit: Payer: BC Managed Care – PPO

## 2020-10-19 ENCOUNTER — Other Ambulatory Visit (INDEPENDENT_AMBULATORY_CARE_PROVIDER_SITE_OTHER): Payer: BC Managed Care – PPO

## 2020-10-19 DIAGNOSIS — N183 Chronic kidney disease, stage 3 unspecified: Secondary | ICD-10-CM

## 2020-10-19 DIAGNOSIS — Z794 Long term (current) use of insulin: Secondary | ICD-10-CM | POA: Diagnosis not present

## 2020-10-19 DIAGNOSIS — N179 Acute kidney failure, unspecified: Secondary | ICD-10-CM

## 2020-10-19 DIAGNOSIS — N189 Chronic kidney disease, unspecified: Secondary | ICD-10-CM

## 2020-10-19 DIAGNOSIS — E1165 Type 2 diabetes mellitus with hyperglycemia: Secondary | ICD-10-CM | POA: Diagnosis not present

## 2020-10-19 DIAGNOSIS — Z1159 Encounter for screening for other viral diseases: Secondary | ICD-10-CM

## 2020-10-20 ENCOUNTER — Encounter: Payer: Self-pay | Admitting: Internal Medicine

## 2020-10-20 LAB — FRUCTOSAMINE: Fructosamine: 303 umol/L — ABNORMAL HIGH (ref 0–285)

## 2020-10-23 LAB — BASIC METABOLIC PANEL WITH GFR
BUN/Creatinine Ratio: 14 (calc) (ref 6–22)
BUN: 25 mg/dL (ref 7–25)
CO2: 25 mmol/L (ref 20–32)
Calcium: 10.5 mg/dL — ABNORMAL HIGH (ref 8.6–10.3)
Chloride: 100 mmol/L (ref 98–110)
Creat: 1.74 mg/dL — ABNORMAL HIGH (ref 0.70–1.33)
GFR, Est African American: 51 mL/min/{1.73_m2} — ABNORMAL LOW (ref >=60)
GFR, Est Non African American: 44 mL/min/{1.73_m2} — ABNORMAL LOW (ref >=60)
Glucose, Bld: 153 mg/dL — ABNORMAL HIGH (ref 65–99)
Potassium: 4.3 mmol/L (ref 3.5–5.3)
Sodium: 140 mmol/L (ref 135–146)

## 2020-10-23 LAB — HEPATITIS C ANTIBODY
Hepatitis C Ab: NONREACTIVE
SIGNAL TO CUT-OFF: 0.01 (ref <1.00)

## 2020-10-24 ENCOUNTER — Other Ambulatory Visit: Payer: Self-pay

## 2020-10-24 ENCOUNTER — Ambulatory Visit: Payer: BC Managed Care – PPO | Admitting: Endocrinology

## 2020-10-24 ENCOUNTER — Encounter: Payer: Self-pay | Admitting: Endocrinology

## 2020-10-24 VITALS — BP 112/78 | HR 104 | Ht 75.0 in | Wt 265.4 lb

## 2020-10-24 DIAGNOSIS — N183 Chronic kidney disease, stage 3 unspecified: Secondary | ICD-10-CM | POA: Diagnosis not present

## 2020-10-24 DIAGNOSIS — E1165 Type 2 diabetes mellitus with hyperglycemia: Secondary | ICD-10-CM

## 2020-10-24 NOTE — Patient Instructions (Addendum)
Metformin 2x daily  Stop BP med  Walk daily

## 2020-10-24 NOTE — Progress Notes (Signed)
Patient ID: Raymond Ryan, male   DOB: [birth year], 54 y.o.   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 He thinks he had been able to keep his weight down with Victoza which he took only for short time in 2016 He has had no side effects with Victoza which was stopped because of his occupation also  Recent history:    Non-insulin hypoglycemic drugs the patient is taking are:   Ozempic 0.25 mg weekly, Metformin   His A1c previously was 10.2 and progressively higher Fructosamine level is 303, previously 325  Current management, blood sugar patterns and problems identified:  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   On his follow-up after surgery he was still having readings as high as 257 and was started back on Ozempic 0.25 mg weekly  He is also starting to check his blood sugars more often as directed  Currently he is continuing Metformin but does not take it consistently twice a day as directed  Most of his sugars are improving and fairly consistently near normal although fasting readings are relatively higher  He is having small frequent meals and able to keep his portions very small  Also currently only getting solids with low carbohydrate intake, having protein like grilled chicken  He thinks Ozempic increases his satiety but does not cause nausea  He has not started walking as yet as he just got clearance from the surgeon to do so Weight is down about 14 pounds since last visit       Side  effects from medications have been: Diarrhea from regular metformin        Glucose monitoring:  done 1-2 times a day         Glucometer:  Freestyle neo.       Blood Glucose readings by time of day from download for the last 30 days   PRE-MEAL Fasting  afternoon Dinner Bedtime Overall  Glucose range:  109-164  106-189  91-181  91-151   Mean/median:  133  146  129  128 131    Prior:  PRE-MEAL Fasting Lunch Dinner Bpdtime Overall  Glucose range:  147-202  135-236  102-249   66-257  Mean/median:  175  186    172   POST-MEAL PC Breakfast PC Lunch PC Dinner  Glucose range:   198  66-206  Mean/median:    168    Dietician visit, most recent: Never  Weight history:  Wt Readings from Last 3 Encounters:  10/24/20 265 lb 6.4 oz (120.4 kg)  09/26/20 278 lb (126.1 kg)  09/26/20 279 lb 6.4 oz (126.7 kg)    Glycemic control:   Lab Results  Component Value Date   HGBA1C 10.2 (H) 09/22/2020   HGBA1C 9.5 (H) 09/01/2020   HGBA1C 7.8 (H) 12/27/2019   Lab Results  Component Value Date   MICROALBUR 7.6 (H) 12/27/2019   [place]  52 08/30/2019   CREATININE 1.74 (H) 10/19/2020   Lab Results  Component Value Date   MICRALBCREAT 5.6 12/27/2019    Lab Results  Component Value Date   FRUCTOSAMINE 303 (H) 10/19/2020   FRUCTOSAMINE 325 (H) 08/30/2019   FRUCTOSAMINE 333 (H) 12/16/2018    Lab on 10/19/2020  Component Date Value Ref Range Status   Fructosamine 10/19/2020 303* 0 - 285 umol/L Final   Comment: Published reference interval for apparently healthy subjects between age 28 and 84 is 13 - 285 umol/L and in a poorly controlled diabetic population is 228 - 563 umol/L with a mean of 396 umol/L.   Appointment on 10/19/2020  Component Date Value Ref Range Status   Hepatitis C Ab 10/19/2020 NON-REACTIVE  NON-REACTI Final   SIGNAL TO CUT-OFF 10/19/2020 0.01  <1.00 Final   Comment: . HCV antibody was non-reactive. There is no laboratory  evidence of HCV infection. . In  most cases, no further action is required. However, if recent HCV exposure is suspected, a test for HCV RNA (test code [phone]) is suggested. . For additional information please refer to http://education.questdiagn[facility].com/faq/FAQ22v1 (This link is being provided for informational/ educational purposes only.) .    Glucose, Bld 10/19/2020 153* 65 - 99 mg/dL Final   Comment: .            Fasting reference interval . For someone without known diabetes, a glucose value >125 mg/dL indicates that they may have diabetes and this should be confirmed with a follow-up test. .    BUN 10/19/2020 25  7 - 25 mg/dL Final   Creat 10/19/2020 1.74* 0.70 - 1.33 mg/dL Final   Comment: For patients >32 years of age, the reference limit for Creatinine is approximately 13% higher for people identified as African-American. .    GFR, Est Non African American 10/19/2020 44* > OR = 60 mL/min/1.51m2 Final   GFR, Est African American 10/19/2020 51* > OR = 60 mL/min/1.54m2 Final   BUN/Creatinine Ratio 10/19/2020 14  6 - 22 (calc) Final   Sodium 10/19/2020 140  135 - 146 mmol/L Final   Potassium 10/19/2020 4.3  3.5 - 5.3 mmol/L Final   Chloride 10/19/2020 100  98 - 110 mmol/L Final   CO2 10/19/2020 25  20 - 32 mmol/L Final   Calcium 10/19/2020 10.5* 8.6 - 10.3 mg/dL Final    Allergies as of 10/24/2020   No Known Allergies     Medication List       Accurate as of October 24, 2020 11:48 AM. If you have any questions, ask your nurse or doctor.        gabapentin 300 MG capsule Commonly known as: NEURONTIN Take 1 capsule (300 mg total) by mouth at bedtime as needed. What changed:   when to take this  reasons to take this   lisinopril-hydrochlorothiazide 20-25 MG tablet Commonly known as: ZESTORETIC Take 0.5 tablets by mouth daily.   multivitamin with minerals Tabs tablet Take 1 tablet by mouth daily.   OneTouch Verio Flex System w/Device Kit by Does not apply route.    OneTouch Verio test strip Generic drug: glucose blood USE AS INSTRUCTED TO CHECK BLOOD SUGAR THREE TIMES DAILY..   Ozempic (0.25 or 0.5 MG/DOSE) 2 MG/1.5ML Sopn Generic drug: Semaglutide(0.25 or 0.5MG /DOS) Inject 0.5 mg into the skin once a week.       Allergies: No Known Allergies  Past Medical History:  Diagnosis Date   Charcot foot due to diabetes mellitus ([place])  Constipation    sometimes soft, some hard -- does go daily - are on fiber    Diabetes mellitus    GERD (gastroesophageal reflux disease)    Headache    Hyperlipidemia    Hypertension    Neuromuscular disorder (HCC)    neuropathy    Past Surgical History:  Procedure Laterality Date   LAPAROSCOPIC GASTRIC SLEEVE RESECTION N/A 09/05/2020   Procedure: LAPAROSCOPIC SLEEVE GASTRECTOMY;  Surgeon: Greer Pickerel, MD;  Location: [facility];  Service: General;  Laterality: N/A;   UPPER GASTROINTESTINAL ENDOSCOPY  2002   Dr Collene Mares    UPPER GI ENDOSCOPY N/A 09/05/2020   Procedure: UPPER GI ENDOSCOPY;  Surgeon: Greer Pickerel, MD;  Location: [facility];  Service: General;  Laterality: N/A;    Family History  Problem Relation Age of Onset   Diabetes Mellitus II Mother    CAD Mother    Prostate cancer Father    Prostate cancer Maternal Uncle    Prostate cancer Maternal Uncle    Colon cancer Neg Hx    Colon polyps Neg Hx     Social History:  reports that he has never smoked. He has never used smokeless tobacco. He reports that he does not drink alcohol and does not use drugs.   Review of Systems  Lipid history: Has been treated by PCP with 20 mg atorvastatin   Last lipid panel:    Lab Results  Component Value Date   CHOL 111 08/30/2019   HDL 33.90 (L) 08/30/2019   LDLCALC 52 08/30/2019   TRIG 126.0 08/30/2019   CHOLHDL 3 08/30/2019           Hypertension: Has been treated with Zestoretic 1/2 daily This is followed by nephrologist and PCP    BP Readings from Last 3 Encounters:  10/24/20 112/78   09/26/20 120/82  09/26/20 128/84    He takes gabapentin at night from his PCP for neuropathic pain  Currently known complications of diabetes: Neuropathy, erectile dysfunction, unknown status of retinopathy  Has a renal dysfunction of unclear etiology with variable labs  Lab Results  Component Value Date   CREATININE 1.74 (H) 10/19/2020   CREATININE 1.85 (H) 09/22/2020   CREATININE 1.60 (H) 09/06/2020   Also appears to have mild persistent hypercalcemia  Lab Results  Component Value Date   CALCIUM 10.5 (H) 10/19/2020   CAION 1.23 05/04/2012     LABS:  Lab on 10/19/2020  Component Date Value Ref Range Status   Fructosamine 10/19/2020 303* 0 - 285 umol/L Final   Comment: Published reference interval for apparently healthy subjects between age 69 and 89 is 22 - 285 umol/L and in a poorly controlled diabetic population is 228 - 563 umol/L with a mean of 396 umol/L.   Appointment on 10/19/2020  Component Date Value Ref Range Status   Hepatitis C Ab 10/19/2020 NON-REACTIVE  NON-REACTI Final   SIGNAL TO CUT-OFF 10/19/2020 0.01  <1.00 Final   Comment: . HCV antibody was non-reactive. There is no laboratory  evidence of HCV infection. . In most cases, no further action is required. However, if recent HCV exposure is suspected, a test for HCV RNA (test code [phone]) is suggested. . For additional information please refer to http://education.[facility].com/faq/FAQ22v1 (This link is being provided for informational/ educational purposes only.) .    Glucose, Bld 10/19/2020 153* 65 - 99 mg/dL Final   Comment: .            Fasting reference interval . For someone without  known diabetes, a glucose value >125 mg/dL indicates that they may have diabetes and this should be confirmed with a follow-up test. .    BUN 10/19/2020 25  7 - 25 mg/dL Final   Creat 10/19/2020 1.74* 0.70 - 1.33 mg/dL Final   Comment: For patients >77 years of age, the reference  limit for Creatinine is approximately 13% higher for people identified as African-American. .    GFR, Est Non African American 10/19/2020 44* > OR = 60 mL/min/1.65m2 Final   GFR, Est African American 10/19/2020 51* > OR = 60 mL/min/1.55m2 Final   BUN/Creatinine Ratio 10/19/2020 14  6 - 22 (calc) Final   Sodium 10/19/2020 140  135 - 146 mmol/L Final   Potassium 10/19/2020 4.3  3.5 - 5.3 mmol/L Final   Chloride 10/19/2020 100  98 - 110 mmol/L Final   CO2 10/19/2020 25  20 - 32 mmol/L Final   Calcium 10/19/2020 10.5* 8.6 - 10.3 mg/dL Final    Physical Examination:  BP 112/78    Pulse (!) 104    Ht $R'6\' 3"'Wt$  (1.905 m)    Wt 265 lb 6.4 oz (120.4 kg)    SpO2 95%    BMI 33.17 kg/m     ASSESSMENT:  Diabetes type 2  See history of present illness for discussion of current diabetes management, blood sugar patterns and problems identified  His A1c is last 10.2 but fructosamine is now 303  After his gastric sleeve procedure he has progressively lost weight and now is having near normal blood sugars although still averaging about 130+ in the morning Has been able to modify diet as instructed by his bariatric surgeon However has not started walking as yet Usually postprandial readings are fairly good although not monitor as much recently and appear to be mostly below 150    HYPERTENSION: Blood pressure is improving and he is taking rather small doses of medications  Renal dysfunction: This is slightly variable, has likely chronic kidney disease  HYPERCALCEMIA: Etiology unclear, will check PTH; currently not on any calcium supplements or vitamin D  History of neuropathy: He is still taking gabapentin with relief of symptoms  PLAN:    He will continue Ozempic low doses as this appears to be helping his weight loss Since his fasting readings are high needs to take Metformin twice a day, his creatinine clearance is adequate for this dose Start walking regularly He can leave off his  Zestoretic to see if his blood pressure stays normal without this especially since he is having low sodium intake and is going to be starting more walking for exercise  Also this may lower his calcium  Recheck A1c in 6 weeks  Patient Instructions  Metformin 2x daily  Stop BP med  Walk daily     Elayne Snare 10/24/2020, 11:48 AM   Note: This office note was prepared with Dragon voice recognition system technology. Any transcriptional errors that result from this process are unintentional.

## 2020-10-26 ENCOUNTER — Other Ambulatory Visit: Payer: Self-pay | Admitting: Internal Medicine

## 2020-10-26 DIAGNOSIS — Z760 Encounter for issue of repeat prescription: Secondary | ICD-10-CM

## 2020-10-31 ENCOUNTER — Ambulatory Visit: Payer: BC Managed Care – PPO | Admitting: Skilled Nursing Facility1

## 2020-11-16 ENCOUNTER — Other Ambulatory Visit: Payer: Self-pay | Admitting: Endocrinology

## 2020-12-04 ENCOUNTER — Other Ambulatory Visit: Payer: BC Managed Care – PPO

## 2020-12-07 ENCOUNTER — Ambulatory Visit: Payer: BC Managed Care – PPO | Admitting: Endocrinology

## 2021-02-07 ENCOUNTER — Other Ambulatory Visit: Payer: Self-pay | Admitting: Internal Medicine

## 2021-03-08 ENCOUNTER — Other Ambulatory Visit (INDEPENDENT_AMBULATORY_CARE_PROVIDER_SITE_OTHER): Payer: Self-pay

## 2021-03-08 ENCOUNTER — Other Ambulatory Visit: Payer: Self-pay

## 2021-03-08 DIAGNOSIS — E1165 Type 2 diabetes mellitus with hyperglycemia: Secondary | ICD-10-CM

## 2021-03-08 LAB — BASIC METABOLIC PANEL
BUN: 17 mg/dL (ref 6–23)
CO2: 32 mEq/L (ref 19–32)
Calcium: 9.9 mg/dL (ref 8.4–10.5)
Chloride: 103 mEq/L (ref 96–112)
Creatinine, Ser: 1.42 mg/dL (ref 0.40–1.50)
GFR: 56.38 mL/min — ABNORMAL LOW (ref >60.00)
Glucose, Bld: 107 mg/dL — ABNORMAL HIGH (ref 70–99)
Potassium: 4.3 mEq/L (ref 3.5–5.1)
Sodium: 140 mEq/L (ref 135–145)

## 2021-03-08 LAB — HEMOGLOBIN A1C: Hgb A1c MFr Bld: 7.3 % — ABNORMAL HIGH (ref 4.6–6.5)

## 2021-03-09 LAB — PARATHYROID HORMONE, INTACT (NO CA): PTH: 35 pg/mL (ref 15–65)

## 2021-03-12 ENCOUNTER — Encounter: Payer: Self-pay | Admitting: Endocrinology

## 2021-03-12 ENCOUNTER — Other Ambulatory Visit: Payer: Self-pay | Admitting: Endocrinology

## 2021-03-12 ENCOUNTER — Ambulatory Visit: Payer: BC Managed Care – PPO | Admitting: Endocrinology

## 2021-03-12 ENCOUNTER — Other Ambulatory Visit: Payer: Self-pay

## 2021-03-12 VITALS — BP 126/84 | HR 94 | Ht 75.0 in | Wt 247.0 lb

## 2021-03-12 DIAGNOSIS — I10 Essential (primary) hypertension: Secondary | ICD-10-CM

## 2021-03-12 DIAGNOSIS — E1165 Type 2 diabetes mellitus with hyperglycemia: Secondary | ICD-10-CM | POA: Diagnosis not present

## 2021-03-12 DIAGNOSIS — N289 Disorder of kidney and ureter, unspecified: Secondary | ICD-10-CM | POA: Diagnosis not present

## 2021-03-12 LAB — MICROALBUMIN / CREATININE URINE RATIO
Creatinine,U: 113.5 mg/dL
Microalb Creat Ratio: 3.4 mg/g (ref 0.0–30.0)
Microalb, Ur: 3.9 mg/dL — ABNORMAL HIGH (ref 0.0–1.9)

## 2021-03-12 MED ORDER — METFORMIN HCL ER 500 MG PO TB24: 2000.0000 mg | ORAL_TABLET | Freq: Every day | ORAL | 3 refills | Status: DC

## 2021-03-12 NOTE — Patient Instructions (Addendum)
Metformin er 2 pills daily   Keep am sugar < 120

## 2021-03-12 NOTE — Progress Notes (Signed)
Patient ID: Raymond Ryan, male   DOB: 02-Nov-1966, 55 y.o.   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 He thinks he had been able to keep his weight down with Victoza which he took only for short time in 2016 He has had no side effects with Victoza which was stopped because of his occupation also  Recent history:    Non-insulin hypoglycemic drugs the patient is currently taking: Metformin 500 mg, 1 daily  His A1c previously was 10.2 and progressively higher Fructosamine level is 303, previously 325  Current management, blood sugar patterns and problems identified:  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   Although he was supposed to continue his Ozempic 0.25 mg he stopped this a few weeks ago when he ran out  Also his medication list does not have Metformin and not clear whether he is taking regular or extended release preparation  He only takes this in the morning instead of twice a day  FASTING blood sugars are still mostly high but variable  Sometimes he is eating snacks late at night  Currently blood sugar monitoring is still inadequate  He says that he is trying to get some protein like chicken with most of his meals and may have a protein shake in the morning; otherwise may sometimes have more fruit  He says that with his work he is starting to be more active  Weight is coming down gradually        Side effects from medications have been: Diarrhea from regular metformin       Glucose monitoring:   done 1-2 times a day         Glucometer:  Freestyle neo.       Blood Glucose readings by time of day from download for the last 30 days   PRE-MEAL Fasting Lunch Dinner Bedtime Overall  Glucose range:  115-184  96    79-184  Mean/median:  144    129  129   POST-MEAL PC Breakfast PC Lunch PC Dinner  Glucose range:     Mean/median:   125    PREVIOUSLY:   PRE-MEAL Fasting  afternoon Dinner Bedtime Overall  Glucose range:  109-164  106-189  91-181  91-151   Mean/median:  133  146  129  128 131    Dietician visit, most recent: Never  Weight history:  Wt Readings from Last 3 Encounters:  03/12/21 247 lb (112 kg)  10/24/20 265 lb 6.4 oz (120.4 kg)  09/26/20 278 lb (126.1 kg)    Glycemic control:   Lab Results  Component Value Date   HGBA1C 7.3 (H) 03/08/2021   HGBA1C 10.2 (H) 09/22/2020   HGBA1C 9.5 (H) 09/01/2020   Lab Results  Component Value Date   MICROALBUR 7.6 (H) 12/27/2019   LDLCALC 52 08/30/2019   CREATININE 1.42 03/08/2021   Lab Results  Component Value Date   MICRALBCREAT  5.6 12/27/2019    Lab Results  Component Value Date   FRUCTOSAMINE 303 (H) 10/19/2020   FRUCTOSAMINE 325 (H) 08/30/2019   FRUCTOSAMINE 333 (H) 12/16/2018    Lab on 03/08/2021  Component Date Value Ref Range Status  . PTH 03/08/2021 35  15 - 65 pg/mL Final  . Sodium 03/08/2021 140  135 - 145 mEq/L Final  . Potassium 03/08/2021 4.3  3.5 - 5.1 mEq/L Final  . Chloride 03/08/2021 103  96 - 112 mEq/L Final  . CO2 03/08/2021 32  19 - 32 mEq/L Final  . Glucose, Bld 03/08/2021 107* 70 - 99 mg/dL Final  . BUN 03/08/2021 17  6 - 23 mg/dL Final  . Creatinine, Ser 03/08/2021 1.42  0.40 - 1.50 mg/dL Final  . GFR 03/08/2021 56.38* >60.00 mL/min Final   Calculated using the CKD-EPI Creatinine Equation (2021)  . Calcium 03/08/2021 9.9  8.4 - 10.5 mg/dL Final  . Hgb A1c MFr Bld 03/08/2021 7.3* 4.6 - 6.5 % Final   Glycemic Control Guidelines for People with Diabetes:Non Diabetic:  <6%Goal of  Therapy: <7%Additional Action Suggested:  >8%     Allergies as of 03/12/2021   No Known Allergies     Medication List       Accurate as of March 12, 2021  9:54 AM. If you have any questions, ask your nurse or doctor.        gabapentin 300 MG capsule Commonly known as: NEURONTIN TAKE 1 CAPSULE (300 MG TOTAL) BY MOUTH AT BEDTIME AS NEEDED.   multivitamin with minerals Tabs tablet Take 1 tablet by mouth daily.   OneTouch Verio Flex System w/Device Kit by Does not apply route.   OneTouch Verio test strip Generic drug: glucose blood USE AS INSTRUCTED TO CHECK BLOOD SUGAR THREE TIMES DAILY..   Ozempic (0.25 or 0.5 MG/DOSE) 2 MG/1.5ML Sopn Generic drug: Semaglutide(0.25 or 0.5MG/DOS) Inject 0.5 mg into the skin once a week.       Allergies: No Known Allergies  Past Medical History:  Diagnosis Date  . Charcot foot due to diabetes mellitus ([place])   . Constipation    sometimes soft, some hard -- does go daily - are on fiber   . Diabetes mellitus   . GERD (gastroesophageal reflux disease)   . Headache   . Hyperlipidemia   . Hypertension   . Neuromuscular disorder ([place])    neuropathy    Past Surgical History:  Procedure Laterality Date  . LAPAROSCOPIC GASTRIC SLEEVE RESECTION N/A 09/05/2020   Procedure: LAPAROSCOPIC SLEEVE GASTRECTOMY;  Surgeon: Greer Pickerel, MD;  Location: [facility];  Service: General;  Laterality: N/A;  . UPPER GASTROINTESTINAL ENDOSCOPY  2002   Dr Collene Mares   . UPPER GI ENDOSCOPY N/A 09/05/2020   Procedure: UPPER GI ENDOSCOPY;  Surgeon: Greer Pickerel, MD;  Location: [facility];  Service: General;  Laterality: N/A;    Family History  Problem Relation Age of Onset  . Diabetes Mellitus II Mother   . CAD Mother   . Prostate cancer Father   . Prostate cancer Maternal Uncle   . Prostate cancer Maternal Uncle   . Colon cancer Neg Hx   . Colon polyps Neg Hx     Social History:  reports that he has never smoked. He has never used smokeless tobacco. He reports that  he does not drink alcohol and does not use drugs.   Review of Systems  Lipid history: Had been treated by PCP with 20 mg atorvastatin, no recent follow-up  Last lipid panel:    Lab Results  Component Value Date   CHOL 111 08/30/2019   HDL 33.90 (L) 08/30/2019   LDLCALC 52 08/30/2019   TRIG 126.0 08/30/2019   CHOLHDL 3 08/30/2019           Hypertension: Has been treated with Zestoretic which was recently stopped This is followed by nephrologist and PCP    BP Readings from Last 3 Encounters:  03/12/21 126/84  10/24/20 112/78  09/26/20 120/82    He takes gabapentin at night from his PCP for neuropathic pain  Currently known complications of diabetes: Neuropathy, erectile dysfunction, unknown status of retinopathy  Has a renal dysfunction of unclear etiology with variable labs  Lab Results  Component Value Date   CREATININE 1.42 03/08/2021   CREATININE 1.74 (H) 10/19/2020   CREATININE 1.85 (H) 09/22/2020   Although he has had hypercalcemia this appears to be improved now with better renal function  Lab Results  Component Value Date   PTH 35 03/08/2021   CALCIUM 9.9 03/08/2021   CAION 1.23 05/04/2012     LABS:  Lab on 03/08/2021  Component Date Value Ref Range Status  . PTH 03/08/2021 35  15 - 65 pg/mL Final  . Sodium 03/08/2021 140  135 - 145 mEq/L Final  . Potassium 03/08/2021 4.3  3.5 - 5.1 mEq/L Final  . Chloride 03/08/2021 103  96 - 112 mEq/L Final  . CO2 03/08/2021 32  19 - 32 mEq/L Final  . Glucose, Bld 03/08/2021 107* 70 - 99 mg/dL Final  . BUN 03/08/2021 17  6 - 23 mg/dL Final  . Creatinine, Ser 03/08/2021 1.42  0.40 - 1.50 mg/dL Final  . GFR 03/08/2021 56.38* >60.00 mL/min Final   Calculated using the CKD-EPI Creatinine Equation (2021)  . Calcium 03/08/2021 9.9  8.4 - 10.5 mg/dL Final  . Hgb A1c MFr Bld 03/08/2021 7.3* 4.6 - 6.5 % Final   Glycemic Control Guidelines for People with Diabetes:Non Diabetic:  <6%Goal of Therapy: <7%Additional Action  Suggested:  >8%     Physical Examination:  BP 126/84   Pulse 94   Ht _0  (1.905 m)   Wt 247 lb (112 kg)   SpO2 99%   BMI 30.87 kg/m     ASSESSMENT:  Diabetes type 2  See history of present illness for discussion of current diabetes management, blood sugar patterns and problems identified  His A1c is now 7.3  After his gastric sleeve procedure in 9/21 he has progressively lost weight However blood sugars are still not quite normal especially fasting He is currently only on 500 mg of Metformin Postprandial readings appear to be fairly good also occasionally with high carbohydrate snacks blood sugars may be higher overnight Discussed with him that he still has insulin resistance and increased fasting blood sugars because of this despite his weight loss    HYPERTENSION: Blood pressure is overall fairly well controlled without any medication recently  Renal dysfunction: This is improved although variable, has likely chronic kidney disease related to hypertension  HYPERCALCEMIA: Improved and PTH is 35, not clear if you may have hyperparathyroidism  PLAN:    Needs to make sure he is getting protein with every meal and avoid large portions of carbohydrates snacks Take Metformin 1000 mg daily at least If his blood sugars are still over 120 in the morning he will need to go up to 1500 mg Follow-up with PCP regarding history of hyperlipidemia  Check urine microalbumin  There are  no Patient Instructions on file for this visit.    Elayne Snare 03/12/2021, 9:54 AM   Note: This office note was prepared with Dragon voice recognition system technology. Any transcriptional errors that result from this process are unintentional.

## 2021-03-13 NOTE — Telephone Encounter (Signed)
Im not sure something need to change. Please advise

## 2021-03-28 ENCOUNTER — Other Ambulatory Visit: Payer: BC Managed Care – PPO

## 2021-03-30 ENCOUNTER — Ambulatory Visit: Payer: BC Managed Care – PPO | Admitting: Endocrinology

## 2021-04-04 ENCOUNTER — Other Ambulatory Visit: Payer: Self-pay

## 2021-04-05 ENCOUNTER — Ambulatory Visit: Payer: BC Managed Care – PPO | Admitting: Internal Medicine

## 2021-04-05 ENCOUNTER — Encounter: Payer: Self-pay | Admitting: Internal Medicine

## 2021-04-05 VITALS — BP 140/82 | HR 85 | Ht 75.0 in | Wt 252.0 lb

## 2021-04-05 DIAGNOSIS — E7800 Pure hypercholesterolemia, unspecified: Secondary | ICD-10-CM | POA: Diagnosis not present

## 2021-04-05 DIAGNOSIS — N1831 Chronic kidney disease, stage 3a: Secondary | ICD-10-CM

## 2021-04-05 DIAGNOSIS — I10 Essential (primary) hypertension: Secondary | ICD-10-CM | POA: Diagnosis not present

## 2021-04-05 DIAGNOSIS — L918 Other hypertrophic disorders of the skin: Secondary | ICD-10-CM

## 2021-04-05 MED ORDER — ATORVASTATIN CALCIUM 10 MG PO TABS: 10.0000 mg | ORAL_TABLET | Freq: Every day | ORAL | 3 refills | Status: DC

## 2021-04-05 NOTE — Assessment & Plan Note (Signed)
Lab Results  Component Value Date   LDLCALC 52 08/30/2019   Stable, pt to restart lipitor 10 mg daily

## 2021-04-05 NOTE — Assessment & Plan Note (Signed)
Lab Results  Component Value Date   CREATININE 1.42 03/08/2021   Stable overall, cont to avoid nephrotoxins

## 2021-04-05 NOTE — Patient Instructions (Signed)
Please take all new medication as prescribed - restart the lipitor 10 mg  Please continue all other medications as before, and refills have been done if requested.  Please have the pharmacy call with any other refills you may need.  Please continue your efforts at being more active, low cholesterol diet, and weight control.  Please keep your appointments with your specialists as you may have planned  You will be contacted regarding the referral for: Dermatology for the skin tags  Please make an Appointment to return in 6 months, or sooner if needed

## 2021-04-05 NOTE — Progress Notes (Signed)
Patient ID: Raymond Ryan, male   DOB: 17-May-1966, 55 y.o.   MRN: [id number]        Chief Complaint: follow up HTN, HLD and hyperglycemia, and skin tags       HPI:  Raymond Ryan is a 55 y.o. male here with c/o worsening skin tags especially a very large one near the right inguinal ligament crease fatty fold, Pt denies chest pain, increased sob or doe, wheezing, orthopnea, PND, increased LE swelling, palpitations, dizziness or syncope.   Pt denies polydipsia, polyuria, Denies new worsening neuro focal s/s.   Pt denies fever, wt loss, night sweats, loss of appetite, or other constitutional symptoms  Trying to follow lower chol diet.  Not taking statin but willing to restart lipitor, had no side effect with this.  Does not want new BP med though.  Currently also following dm with endo.   No other new complaints           Wt Readings from Last 3 Encounters:  04/05/21 252 lb (114.3 kg)  03/12/21 247 lb (112 kg)  10/24/20 265 lb 6.4 oz (120.4 kg)   BP Readings from Last 3 Encounters:  04/05/21 140/82  03/12/21 126/84  10/24/20 112/78         Past Medical History:  Diagnosis Date  . Charcot foot due to diabetes mellitus ([place])   . Constipation    sometimes soft, some hard -- does go daily - are on fiber   . Diabetes mellitus   . GERD (gastroesophageal reflux disease)   . Headache   . Hyperlipidemia   . Hypertension   . Neuromuscular disorder ([place])    neuropathy   Past Surgical History:  Procedure Laterality Date  . LAPAROSCOPIC GASTRIC SLEEVE RESECTION N/A 09/05/2020   Procedure: LAPAROSCOPIC SLEEVE GASTRECTOMY;  Surgeon: Greer Pickerel, MD;  Location: [facility];  Service: General;  Laterality: N/A;  . UPPER GASTROINTESTINAL ENDOSCOPY  2002   Dr Collene Mares   . UPPER GI ENDOSCOPY N/A 09/05/2020   Procedure: UPPER GI ENDOSCOPY;  Surgeon: Greer Pickerel, MD;  Location: [facility];  Service: General;  Laterality: N/A;    reports that he has never smoked. He has never used smokeless tobacco. He  reports that he does not drink alcohol and does not use drugs. family history includes CAD in his mother; Diabetes Mellitus II in his mother; Prostate cancer in his father, maternal uncle, and maternal uncle. No Known Allergies Current Outpatient Medications on File Prior to Visit  Medication Sig Dispense Refill  . Blood Glucose Monitoring Suppl (ONETOUCH VERIO FLEX SYSTEM) w/Device KIT by Does not apply route.    . gabapentin (NEURONTIN) 300 MG capsule TAKE 1 CAPSULE (300 MG TOTAL) BY MOUTH AT BEDTIME AS NEEDED. 90 capsule 1  . metFORMIN (GLUCOPHAGE-XR) 500 MG 24 hr tablet Take 3 tablets (1,500 mg total) by mouth daily with supper. 90 tablet 3  . Multiple Vitamin (MULTIVITAMIN WITH MINERALS) TABS tablet Take 1 tablet by mouth daily.    Glory [place] VERIO test strip USE AS INSTRUCTED TO CHECK BLOOD SUGAR THREE TIMES DAILY.. 100 strip 3   No current facility-administered medications on file prior to visit.        ROS:  All others reviewed and negative.  Objective        PE:  BP 140/82 (BP Location: Left Arm, Patient Position: Sitting, Cuff Size: Large)   Pulse 85   Ht $R'6\' 3"'ea$  (1.905 m)   Wt 252 lb (114.3 kg)  SpO2 98%   BMI 31.50 kg/m                 Constitutional: Pt appears in NAD               HENT: Head: NCAT.                Right Ear: External ear normal.                 Left Ear: External ear normal.                Eyes: . Pupils are equal, round, and reactive to light. Conjunctivae and EOM are normal               Nose: without d/c or deformity               Neck: Neck supple. Gross normal ROM               Cardiovascular: Normal rate and regular rhythm.                 Pulmonary/Chest: Effort normal and breath sounds without rales or wheezing.                Abd:  Soft, NT, ND, + BS, no organomegaly               Neurological: Pt is alert. At baseline orientation, motor grossly intact               Skin: Skin is warm. Has large 1 cm skin tag near right inguinal ligament area,  LE edema - none               Psychiatric: Pt behavior is normal without agitation   Micro: none  Cardiac tracings I have personally interpreted today:  none  Pertinent Radiological findings (summarize): none   Lab Results  Component Value Date   WBC 9.5 09/06/2020   HGB 15.1 09/06/2020   HCT 46.8 09/06/2020   PLT 344 09/06/2020   GLUCOSE 107 (H) 03/08/2021   CHOL 111 08/30/2019   TRIG 126.0 08/30/2019   HDL 33.90 (L) 08/30/2019   LDLCALC 52 08/30/2019   ALT 44 09/06/2020   AST 31 09/06/2020   NA 140 03/08/2021   K 4.3 03/08/2021   CL 103 03/08/2021   CREATININE 1.42 03/08/2021   BUN 17 03/08/2021   CO2 32 03/08/2021   TSH 1.59 07/15/2019   PSA 1.31 07/15/2019   HGBA1C 7.3 (H) 03/08/2021   MICROALBUR 3.9 (H) 03/12/2021   Assessment/Plan:  Raymond Ryan is a 55 y.o. Black or African American [2] male with  has a past medical history of Charcot foot due to diabetes mellitus ([place]), Constipation, Diabetes mellitus, GERD (gastroesophageal reflux disease), Headache, Hyperlipidemia, Hypertension, and Neuromuscular disorder ([place]).  Hyperlipidemia Lab Results  Component Value Date   LDLCALC 52 08/30/2019   Stable, pt to restart lipitor 10 mg daily  HTN (hypertension) BP Readings from Last 3 Encounters:  04/05/21 140/82  03/12/21 126/84  10/24/20 112/78   Borderline elevated, goal < 130/80. Pt not willing to start even low dose ace or arb for now,  to f/u any worsening symptoms or concerns   CKD (chronic kidney disease) stage 3, GFR 30-59 ml/min Lab Results  Component Value Date   CREATININE 1.42 03/08/2021   Stable overall, cont to avoid nephrotoxins   Skin tags, multiple acquired Right inguinal area and small ones to necklace  area  - for derm referral  Followup: Return in about 6 months (around 10/05/2021).  [name], MD 04/05/2021 8:59 PM [facility]

## 2021-04-05 NOTE — Assessment & Plan Note (Signed)
Right inguinal area and small ones to necklace area  - for derm referral

## 2021-04-05 NOTE — Assessment & Plan Note (Signed)
BP Readings from Last 3 Encounters:  04/05/21 140/82  03/12/21 126/84  10/24/20 112/78   Borderline elevated, goal < 130/80. Pt not willing to start even low dose ace or arb for now,  to f/u any worsening symptoms or concerns

## 2021-06-12 ENCOUNTER — Other Ambulatory Visit: Payer: BC Managed Care – PPO

## 2021-06-15 ENCOUNTER — Ambulatory Visit: Payer: BC Managed Care – PPO | Admitting: Endocrinology

## 2021-07-25 ENCOUNTER — Other Ambulatory Visit: Payer: Self-pay | Admitting: Endocrinology

## 2021-09-20 ENCOUNTER — Ambulatory Visit: Payer: BC Managed Care – PPO | Admitting: Physician Assistant

## 2021-10-18 ENCOUNTER — Emergency Department (HOSPITAL_BASED_OUTPATIENT_CLINIC_OR_DEPARTMENT_OTHER): Payer: BC Managed Care – PPO | Admitting: Radiology

## 2021-10-18 ENCOUNTER — Other Ambulatory Visit: Payer: Self-pay

## 2021-10-18 ENCOUNTER — Emergency Department (HOSPITAL_BASED_OUTPATIENT_CLINIC_OR_DEPARTMENT_OTHER)
Admission: EM | Admit: 2021-10-18 | Discharge: 2021-10-18 | Disposition: A | Discharge status: Home / Self Care | Payer: BC Managed Care – PPO | Attending: Emergency Medicine | Admitting: Emergency Medicine

## 2021-10-18 ENCOUNTER — Emergency Department (HOSPITAL_BASED_OUTPATIENT_CLINIC_OR_DEPARTMENT_OTHER): Payer: BC Managed Care – PPO

## 2021-10-18 ENCOUNTER — Encounter (HOSPITAL_BASED_OUTPATIENT_CLINIC_OR_DEPARTMENT_OTHER): Payer: Self-pay | Admitting: Emergency Medicine

## 2021-10-18 DIAGNOSIS — E1122 Type 2 diabetes mellitus with diabetic chronic kidney disease: Secondary | ICD-10-CM | POA: Insufficient documentation

## 2021-10-18 DIAGNOSIS — S3991XA Unspecified injury of abdomen, initial encounter: Secondary | ICD-10-CM | POA: Insufficient documentation

## 2021-10-18 DIAGNOSIS — Z7984 Long term (current) use of oral hypoglycemic drugs: Secondary | ICD-10-CM | POA: Diagnosis not present

## 2021-10-18 DIAGNOSIS — N182 Chronic kidney disease, stage 2 (mild): Secondary | ICD-10-CM | POA: Insufficient documentation

## 2021-10-18 DIAGNOSIS — S134XXA Sprain of ligaments of cervical spine, initial encounter: Secondary | ICD-10-CM | POA: Diagnosis not present

## 2021-10-18 DIAGNOSIS — I129 Hypertensive chronic kidney disease with stage 1 through stage 4 chronic kidney disease, or unspecified chronic kidney disease: Secondary | ICD-10-CM | POA: Diagnosis not present

## 2021-10-18 DIAGNOSIS — Y92410 Unspecified street and highway as the place of occurrence of the external cause: Secondary | ICD-10-CM | POA: Diagnosis not present

## 2021-10-18 DIAGNOSIS — S4992XA Unspecified injury of left shoulder and upper arm, initial encounter: Secondary | ICD-10-CM | POA: Insufficient documentation

## 2021-10-18 DIAGNOSIS — S199XXA Unspecified injury of neck, initial encounter: Secondary | ICD-10-CM | POA: Diagnosis present

## 2021-10-18 LAB — BASIC METABOLIC PANEL
Anion gap: 10 (ref 5–15)
BUN: 12 mg/dL (ref 6–20)
CO2: 26 mmol/L (ref 22–32)
Calcium: 9.2 mg/dL (ref 8.9–10.3)
Chloride: 104 mmol/L (ref 98–111)
Creatinine, Ser: 1.26 mg/dL — ABNORMAL HIGH (ref 0.61–1.24)
GFR, Estimated: >60 mL/min (ref >60)
Glucose, Bld: 157 mg/dL — ABNORMAL HIGH (ref 70–99)
Potassium: 4.2 mmol/L (ref 3.5–5.1)
Sodium: 140 mmol/L (ref 135–145)

## 2021-10-18 MED ORDER — ACETAMINOPHEN 500 MG PO TABS
1000.0000 mg | ORAL_TABLET | Freq: Once | ORAL | Status: AC
  Administered 2021-10-18: 1000 mg via ORAL
  Filled 2021-10-18: qty 2

## 2021-10-18 MED ORDER — IOHEXOL 300 MG/ML  SOLN
100.0000 mL | Freq: Once | INTRAMUSCULAR | Status: AC | PRN
  Administered 2021-10-18: 100 mL via INTRAVENOUS
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
## 2021-10-18 NOTE — Discharge Instructions (Signed)
Your imaging was negative for acute traumatic abnormalities.  You may have musculoskeletal pain that is worse in the next 24 hours.  Recommend continued Tylenol and ibuprofen for pain control, intermittent ice and heating for discomfort.

## 2021-10-23 ENCOUNTER — Encounter: Payer: Self-pay | Admitting: Internal Medicine

## 2021-10-23 ENCOUNTER — Other Ambulatory Visit: Payer: Self-pay

## 2021-10-23 ENCOUNTER — Ambulatory Visit: Payer: BC Managed Care – PPO | Admitting: Internal Medicine

## 2021-10-23 VITALS — BP 158/82 | HR 87 | Ht 76.0 in | Wt 258.0 lb

## 2021-10-23 DIAGNOSIS — N182 Chronic kidney disease, stage 2 (mild): Secondary | ICD-10-CM

## 2021-10-23 DIAGNOSIS — I10 Essential (primary) hypertension: Secondary | ICD-10-CM

## 2021-10-23 DIAGNOSIS — D734 Cyst of spleen: Secondary | ICD-10-CM

## 2021-10-23 DIAGNOSIS — E1122 Type 2 diabetes mellitus with diabetic chronic kidney disease: Secondary | ICD-10-CM

## 2021-10-23 DIAGNOSIS — M25512 Pain in left shoulder: Secondary | ICD-10-CM

## 2021-10-23 MED ORDER — PREDNISONE 10 MG PO TABS: ORAL_TABLET | ORAL | 0 refills | Status: DC

## 2021-10-23 MED ORDER — TRAMADOL HCL 50 MG PO TABS: 50.0000 mg | ORAL_TABLET | Freq: Four times a day (QID) | ORAL | 0 refills | Status: DC | PRN

## 2021-10-23 NOTE — Patient Instructions (Signed)
Please take all new medication as prescribed - the pain medication, and prednisone  Please also see sports medicine on the first floor if not improved  Please continue all other medications as before, and refills have been done if requested.  Please have the pharmacy call with any other refills you may need.  Please continue your efforts at being more active, low cholesterol diet, and weight control.  Please keep your appointments with your specialists as you may have planned
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
## 2021-10-30 DIAGNOSIS — D734 Cyst of spleen: Secondary | ICD-10-CM | POA: Insufficient documentation
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
## 2021-10-30 NOTE — Assessment & Plan Note (Signed)
BP Readings from Last 3 Encounters:  10/23/21 (!) 158/82  10/18/21 (!) 161/101  04/05/21 140/82   Uncontrolled here but < 140/90 at home per pt, pt to continue medical treatment  - low salt diet and wt control, declines med start today

## 2021-10-30 NOTE — Assessment & Plan Note (Signed)
Lab Results  Component Value Date   HGBA1C 7.3 (H) 03/08/2021   Mild uncontrolled, pt to continue current medical treatment metformin, declines change or a1c today

## 2021-10-30 NOTE — Assessment & Plan Note (Signed)
Incidental finding on recent ct abd/pelvis, asympt, d/w pt - reassured, will continue to monitor

## 2022-02-19 ENCOUNTER — Other Ambulatory Visit: Payer: Self-pay | Admitting: Endocrinology

## 2022-03-06 ENCOUNTER — Other Ambulatory Visit: Payer: Self-pay | Admitting: Endocrinology

## 2022-03-14 ENCOUNTER — Other Ambulatory Visit: Payer: Self-pay | Admitting: Endocrinology

## 2022-03-15 ENCOUNTER — Other Ambulatory Visit: Payer: BC Managed Care – PPO

## 2022-03-15 ENCOUNTER — Other Ambulatory Visit: Payer: Self-pay

## 2022-03-15 ENCOUNTER — Other Ambulatory Visit (INDEPENDENT_AMBULATORY_CARE_PROVIDER_SITE_OTHER): Payer: BC Managed Care – PPO

## 2022-03-15 DIAGNOSIS — E1165 Type 2 diabetes mellitus with hyperglycemia: Secondary | ICD-10-CM | POA: Diagnosis not present

## 2022-03-15 LAB — COMPREHENSIVE METABOLIC PANEL
ALT: 10 U/L (ref 0–53)
AST: 11 U/L (ref 0–37)
Albumin: 4.5 g/dL (ref 3.5–5.2)
Alkaline Phosphatase: 77 U/L (ref 39–117)
BUN: 17 mg/dL (ref 6–23)
CO2: 30 mEq/L (ref 19–32)
Calcium: 10.2 mg/dL (ref 8.4–10.5)
Chloride: 102 mEq/L (ref 96–112)
Creatinine, Ser: 1.39 mg/dL (ref 0.40–1.50)
GFR: 57.43 mL/min — ABNORMAL LOW (ref >60.00)
Glucose, Bld: 116 mg/dL — ABNORMAL HIGH (ref 70–99)
Potassium: 4.3 mEq/L (ref 3.5–5.1)
Sodium: 141 mEq/L (ref 135–145)
Total Bilirubin: 0.7 mg/dL (ref 0.2–1.2)
Total Protein: 7.5 g/dL (ref 6.0–8.3)

## 2022-03-15 LAB — MICROALBUMIN / CREATININE URINE RATIO
Creatinine,U: 169 mg/dL
Microalb Creat Ratio: 3.4 mg/g (ref 0.0–30.0)
Microalb, Ur: 5.7 mg/dL — ABNORMAL HIGH (ref 0.0–1.9)

## 2022-03-15 LAB — LIPID PANEL
Cholesterol: 166 mg/dL (ref 0–200)
HDL: 40.2 mg/dL (ref >39.00)
LDL Cholesterol: 100 mg/dL — ABNORMAL HIGH (ref 0–99)
NonHDL: 125.58
Total CHOL/HDL Ratio: 4
Triglycerides: 128 mg/dL (ref 0.0–149.0)
VLDL: 25.6 mg/dL (ref 0.0–40.0)

## 2022-03-15 LAB — HEMOGLOBIN A1C: Hgb A1c MFr Bld: 8.9 % — ABNORMAL HIGH (ref 4.6–6.5)

## 2022-03-19 ENCOUNTER — Other Ambulatory Visit: Payer: Self-pay

## 2022-03-19 ENCOUNTER — Encounter: Payer: Self-pay | Admitting: Endocrinology

## 2022-03-19 ENCOUNTER — Ambulatory Visit: Payer: BC Managed Care – PPO | Admitting: Endocrinology

## 2022-03-19 VITALS — BP 130/84 | HR 88 | Ht 76.0 in | Wt 256.4 lb

## 2022-03-19 DIAGNOSIS — E1165 Type 2 diabetes mellitus with hyperglycemia: Secondary | ICD-10-CM

## 2022-03-19 DIAGNOSIS — E7800 Pure hypercholesterolemia, unspecified: Secondary | ICD-10-CM

## 2022-03-19 DIAGNOSIS — I10 Essential (primary) hypertension: Secondary | ICD-10-CM | POA: Diagnosis not present

## 2022-03-19 MED ORDER — ATORVASTATIN CALCIUM 10 MG PO TABS: 10.0000 mg | ORAL_TABLET | Freq: Every day | ORAL | 3 refills | Status: DC

## 2022-03-19 MED ORDER — LOSARTAN POTASSIUM 50 MG PO TABS: 50.0000 mg | ORAL_TABLET | Freq: Every day | ORAL | 1 refills | Status: DC

## 2022-03-19 MED ORDER — OZEMPIC (0.25 OR 0.5 MG/DOSE) 2 MG/3ML ~~LOC~~ SOPN: 0.5000 mg | PEN_INJECTOR | SUBCUTANEOUS | 1 refills | Status: DC

## 2022-03-19 MED ORDER — ONETOUCH VERIO VI STRP: ORAL_STRIP | 3 refills | Status: DC

## 2022-03-19 NOTE — Progress Notes (Signed)
v

## 2022-03-19 NOTE — Patient Instructions (Addendum)
Start OZEMPIC injections by dialing 0.25 mg on the pen as shown once weekly on the same day of the week. ? ?You will feel fullness of the stomach with starting the medication and should try to keep the portions at meals small.  ?You may experience nausea in the first few days which usually gets better over time   ? ?After 1-2 weeks increase the dose to 0.5 mg weekly ? ?If you have any questions or persistent side effects please call the office  ? ?You may also talk to a nurse educator with [employer] at [phone] ?Useful website: [email or web address]  ? ?Metformin 1-2 pills daily ? ?

## 2022-03-19 NOTE — Progress Notes (Signed)
Patient ID: Raymond Ryan, male   DOB: May 24, 1966, 56 y.o.   MRN: [id number] ? ?       ? ? ?Reason for Appointment: Follow-up  ? ?History of Present Illness:  ?        ?Date of diagnosis of type 2 diabetes mellitus:    2002    ? ?Background history:  ? 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 ?Recent history:  ? ? ?Non-insulin hypoglycemic drugs the patient is currently taking: Metformin 500 mg, 2-3 daily ? ?His A1c previously was 7.3 and now 8.9 ?He has not been seen for about a year ? 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 ?He thinks his blood sugars started going up in January but not clear how often he has been monitoring, glucose in October was 157 in the lab  ?He has difficulty taking full doses of metformin because of tendency to diarrhea and he mostly takes 2 pills a day and sometimes 3, this is even with the extended release  ?Because of being busy with work he is not able to find time for exercise  ?He thinks his main difficulty is craving for snacks and carbohydrates and he is frequently eating potato chips and cookies ?However he is still keeping his meal portion small and has not gained any weight  ?Prior to his surgery he had taken Ozempic for couple of years with no side effects but only 0.5 mg weekly ?Recently highest blood sugar is around 200 and generally blood sugars are higher in the morning which he thinks is from nighttime snacks ?Late morning  glucose in the lab was 116 ? ?      ?Side effects from medications have been: Diarrhea from regular metformin ?     ? ?Glucose monitoring:  done 1-2 times a day         Glucometer:   One Touch Verio   ?Blood Glucose readings by time of day from download for the last 30 days ? ? ? ?PRE-MEAL Fasting Lunch Dinner Bedtime Overall  ?Glucose range: 93-204  89-158 139-147 89-204  ?Mean/median: 140   143 139  ? ? ?PREVIOUSLY ? ?PRE-MEAL Fasting Lunch Dinner Bedtime Overall  ?Glucose range:  115-184  96    79-184  ?Mean/median:  144    129  129  ? ?POST-MEAL PC Breakfast PC Lunch PC Dinner  ?Glucose range:     ?Mean/median:   125   ? ?: ? ? ?Dietician visit, most recent: Never ? ?Weight history: ? ?Wt Readings from Last 3 Encounters:  ?03/19/22 256 lb 6.4 oz (116.3 kg)  ?10/23/21 258 lb (117 kg)  ?10/18/21 262 lb (118.8 kg)  ? ? ?Glycemic control: ?  ?Lab Results  ?Component Value Date  ? HGBA1C 8.9 (H) 03/15/2022  ? HGBA1C 7.3 (H) 03/08/2021  ? HGBA1C 10.2 (H) 09/22/2020  ? ?Lab Results  ?Component Value Date  ? MICROALBUR 5.7 (H)  03/15/2022  ? LDLCALC 100 (H) 03/15/2022  ? CREATININE 1.39 03/15/2022  ? ?Lab Results  ?Component Value Date  ? MICRALBCREAT 3.4 03/15/2022  ? ? ?Lab Results  ?Component Value Date  ? FRUCTOSAMINE 303 (H) 10/19/2020  ? FRUCTOSAMINE 325 (H) 08/30/2019  ? FRUCTOSAMINE 333 (H) 12/16/2018  ? ? ?Lab on 03/15/2022  ?Component Date Value Ref Range Status  ? Cholesterol 03/15/2022 166  0 - 200 mg/dL Final  ? ATP III Classification       Desirable:  < 200 mg/dL               Borderline High:  200 - 239 mg/dL          High:  > = 240 mg/dL  ? Triglycerides 03/15/2022 128.0  0.0 - 149.0 mg/dL Final  ? Normal:  <150 mg/dLBorderline High:  150 - 199 mg/dL  ? HDL 03/15/2022 40.20  >39.00 mg/dL Final  ? VLDL 03/15/2022 25.6  0.0 - 40.0 mg/dL Final  ? LDL Cholesterol 03/15/2022 100 (H)  0 - 99 mg/dL Final  ? Total CHOL/HDL Ratio 03/15/2022 4   Final  ?                Men          Women1/2 Average Risk     3.4           3.3Average Risk          5.0          4.42X Average Risk          9.6          7.13X Average Risk          15.0          11.0                      ? NonHDL 03/15/2022 125.58   Final  ? NOTE:  Non-HDL goal should be 30 mg/dL higher than patient's LDL goal (i.e. LDL goal of < 70 mg/dL, would have non-HDL goal of < 100 mg/dL)  ? Microalb, Ur 03/15/2022 5.7 (H)  0.0 - 1.9 mg/dL Final  ? Creatinine,U 03/15/2022 169.0  mg/dL Final  ? Microalb Creat Ratio 03/15/2022 3.4  0.0 - 30.0 mg/g Final  ? Sodium 03/15/2022 141  135 - 145 mEq/L Final  ? Potassium 03/15/2022 4.3  3.5 - 5.1 mEq/L Final  ? Chloride 03/15/2022 102  96 - 112 mEq/L Final  ? CO2 03/15/2022 30  19 - 32 mEq/L Final  ? Glucose, Bld 03/15/2022 116 (H)  70 - 99 mg/dL Final  ? BUN 03/15/2022 17  6 - 23 mg/dL Final  ? Creatinine, Ser 03/15/2022 1.39  0.40 - 1.50 mg/dL Final  ? Total Bilirubin 03/15/2022 0.7  0.2 - 1.2 mg/dL Final  ? Alkaline Phosphatase 03/15/2022 77  39 - 117 U/L Final  ? AST 03/15/2022 11  0 - 37 U/L Final  ? ALT 03/15/2022 10  0 - 53 U/L Final  ? Total Protein 03/15/2022 7.5  6.0 - 8.3 g/dL Final  ? Albumin 03/15/2022 4.5  3.5 - 5.2 g/dL Final  ? GFR 03/15/2022 57.43 (L)  >60.00 mL/min Final  ? Calculated using the CKD-EPI Creatinine Equation (2021)  ? Calcium 03/15/2022 10.2  8.4 - 10.5 mg/dL Final  ? Hgb A1c MFr Bld 03/15/2022 8.9 (H)  4.6 - 6.5 % Final  ? Glycemic Control Guidelines for People with Diabetes:Non  Diabetic:  <6%Goal of Therapy: <7%Additional Action Suggested:  >8%   ? ? ?Allergies as of 03/19/2022   ?No Known Allergies ?  ? ?  ?Medication List  ?  ? ?  ? Accurate as of March 19, 2022 11:04 AM. If you have any questions, ask your nurse or doctor.  ?  ?  ? ?  ? ?atorvastatin 10 MG tablet ?Commonly known as: Lipitor ?Take 1 tablet (10 mg total) by mouth daily. ?  ?gabapentin 300 MG capsule ?Commonly known as: NEURONTIN ?TAKE 1 CAPSULE (300 MG TOTAL) BY MOUTH AT BEDTIME AS NEEDED. ?  ?metFORMIN 500 MG 24 hr tablet ?Commonly  known as: GLUCOPHAGE-XR ?TAKE 3 TABLETS (1,500 MG TOTAL) BY MOUTH DAILY WITH SUPPER. ?  ?multivitamin with minerals Tabs tablet ?Take 1 tablet by mouth daily. ?  ?Marketing executive w/Device Kit ?by Does not apply route. ?  ?OneTouch Verio test strip ?Generic drug: glucose blood ?USE AS INSTRUCTED TO CHECK BLOOD SUGAR THREE TIMES DAILY.Marland Kitchen ?  ?predniSONE 10 MG tablet ?Commonly known as: DELTASONE ?3 tabs by mouth per day for 3 days,2tabs per day for 3 days,1tab per day for 3 days ?  ?traMADol 50 MG tablet ?Commonly known as: ULTRAM ?Take 1 tablet (50 mg total) by mouth every 6 (six) hours as needed. ?  ? ?  ? ? ?Allergies: No Known Allergies ? ?Past Medical History:  ?Diagnosis Date  ? Charcot foot due to diabetes mellitus ([place])   ? Constipation   ? sometimes soft, some hard -- does go daily - are on fiber   ? Diabetes mellitus   ? GERD (gastroesophageal reflux disease)   ? Headache   ? Hyperlipidemia   ? Hypertension   ? Neuromuscular disorder ([place])   ? neuropathy  ? ? ?Past Surgical History:  ?Procedure Laterality Date  ? LAPAROSCOPIC GASTRIC SLEEVE RESECTION N/A 09/05/2020  ? Procedure: LAPAROSCOPIC SLEEVE GASTRECTOMY;  Surgeon: Greer Pickerel, MD;  Location: [facility];  Service: General;  Laterality: N/A;  ? UPPER GASTROINTESTINAL ENDOSCOPY  2002  ? Dr Collene Mares   ? UPPER GI ENDOSCOPY N/A 09/05/2020  ? Procedure: UPPER GI ENDOSCOPY;  Surgeon: Greer Pickerel, MD;  Location: [facility];  Service: General;  Laterality: N/A;  ? ? ?Family History  ?Problem Relation Age of Onset  ? Diabetes Mellitus II Mother   ? CAD Mother   ? Prostate cancer Father   ? Prostate cancer Maternal Uncle   ? Prostate cancer Maternal Uncle   ? Colon cancer Neg Hx   ? Colon polyps Neg Hx   ? ? ?Social History:  reports that he has never smoked. He has never used smokeless tobacco. He reports that he does not drink alcohol and does not use drugs. ? ? ?Review of Systems ? ?Lipid history: Had been treated by PCP with 10 mg atorvastatin, however he has not  been taking this ? ?Last lipid panel: ? ?  ?Lab Results  ?Component Value Date  ? CHOL 166 03/15/2022  ? HDL 40.20 03/15/2022  ? LDLCALC 100 (H) 03/15/2022  ? TRIG 128.0 03/15/2022  ? CHOLHDL 4 03/15/2022

## 2022-03-28 ENCOUNTER — Encounter (HOSPITAL_COMMUNITY): Payer: Self-pay | Admitting: *Deleted

## 2022-05-17 ENCOUNTER — Other Ambulatory Visit: Payer: BC Managed Care – PPO

## 2022-05-21 ENCOUNTER — Ambulatory Visit: Payer: BC Managed Care – PPO | Admitting: Endocrinology

## 2022-06-17 ENCOUNTER — Other Ambulatory Visit (INDEPENDENT_AMBULATORY_CARE_PROVIDER_SITE_OTHER): Payer: BC Managed Care – PPO

## 2022-06-17 DIAGNOSIS — E1165 Type 2 diabetes mellitus with hyperglycemia: Secondary | ICD-10-CM | POA: Diagnosis not present

## 2022-06-17 DIAGNOSIS — E7800 Pure hypercholesterolemia, unspecified: Secondary | ICD-10-CM

## 2022-06-17 LAB — BASIC METABOLIC PANEL
BUN: 14 mg/dL (ref 6–23)
CO2: 29 mEq/L (ref 19–32)
Calcium: 10.1 mg/dL (ref 8.4–10.5)
Chloride: 103 mEq/L (ref 96–112)
Creatinine, Ser: 1.42 mg/dL (ref 0.40–1.50)
GFR: 55.88 mL/min — ABNORMAL LOW (ref >60.00)
Glucose, Bld: 217 mg/dL — ABNORMAL HIGH (ref 70–99)
Potassium: 4.2 mEq/L (ref 3.5–5.1)
Sodium: 141 mEq/L (ref 135–145)

## 2022-06-17 LAB — LDL CHOLESTEROL, DIRECT: Direct LDL: 83 mg/dL

## 2022-06-18 LAB — FRUCTOSAMINE: Fructosamine: 380 umol/L — ABNORMAL HIGH (ref 0–285)

## 2022-06-19 ENCOUNTER — Encounter: Payer: Self-pay | Admitting: Endocrinology

## 2022-06-19 ENCOUNTER — Ambulatory Visit: Payer: BC Managed Care – PPO | Admitting: Endocrinology

## 2022-06-19 VITALS — BP 122/90 | HR 95 | Ht 76.0 in | Wt 255.6 lb

## 2022-06-19 DIAGNOSIS — I10 Essential (primary) hypertension: Secondary | ICD-10-CM | POA: Diagnosis not present

## 2022-06-19 DIAGNOSIS — E1165 Type 2 diabetes mellitus with hyperglycemia: Secondary | ICD-10-CM | POA: Diagnosis not present

## 2022-06-19 LAB — POCT GLYCOSYLATED HEMOGLOBIN (HGB A1C): Hemoglobin A1C: 8.7 % — AB (ref 4.0–5.6)

## 2022-06-19 MED ORDER — OZEMPIC (0.25 OR 0.5 MG/DOSE) 2 MG/3ML ~~LOC~~ SOPN: 0.5000 mg | PEN_INJECTOR | SUBCUTANEOUS | 1 refills | Status: DC

## 2022-06-19 MED ORDER — METFORMIN HCL ER 500 MG PO TB24: 1000.0000 mg | ORAL_TABLET | Freq: Every day | ORAL | 2 refills | Status: DC

## 2022-06-19 NOTE — Progress Notes (Signed)
Patient ID: Raymond Ryan, male   DOB: 08/16/1966, 56 y.o.   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 He thinks he had been able to keep his weight down with Victoza which he took only for short time in 2016 He has had no side effects with Victoza which was stopped because of his occupation also  Recent history:    Non-insulin hypoglycemic drugs the patient is currently taking: Metformin 500 mg, 2 daily  His A1c previously was 7.3 and now still relatively high at 8.7  Current management, blood sugar patterns and problems identified: 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  He took Ozempic for only about 4 or 5 injections and he did not increase it to 0.5 as directed, also did not get it refilled and he says that he did not have any further prescriptions, did not notify us about needing a refill His blood sugars are still relatively high and fructosamine of 380 indicates inadequate control He is still not able to control his diet without Ozempic Also did not call to let us know he was out of the medication Likely has high readings after dinner which he does not monitor Again overnight sugars are likely higher from nighttime snacks Late morning glucose in the lab was 217 His weight is about the same Generally not checking sugars after dinner He says he is very busy with long working hours and does not exercise although he may be relatively active at work        [employer] 7 pm  Side effects from medications have been:  Diarrhea from regular metformin       Glucose monitoring:  done 1-2 times a day         Glucometer:   One Touch Verio    Blood Glucose readings by time of day from download for the last 30 days   PRE-MEAL Fasting Lunch Dinner Bedtime Overall  Glucose range: 182-202  124-212  124-212  Mean/median:        POST-MEAL PC Breakfast PC Lunch PC Dinner  Glucose range:   ?  Mean/median:      Prior  PRE-MEAL Fasting Lunch Dinner Bedtime Overall  Glucose range: 93-204  89-158 139-147 89-204  Mean/median: 140   143 139      Dietician visit, most recent: Never  Weight history:  Wt Readings from Last 3 Encounters:  06/19/22 255 lb 9.6 oz (115.9 kg)  03/19/22 256 lb 6.4 oz (116.3 kg)  10/23/21 258 lb (117 kg)    Glycemic control:   Lab Results  Component Value Date   HGBA1C 8.9 (H) 03/15/2022   HGBA1C 7.3 (H) 03/08/2021   HGBA1C 10.2 (H) 09/22/2020   Lab Results  Component Value Date   MICROALBUR 5.7 (H) 03/15/2022   LDLCALC 100 (H) 03/15/2022   CREATININE 1.42 06/17/2022   Lab Results  Component Value Date   MICRALBCREAT 3.4 03/15/2022  Lab Results  Component Value Date   FRUCTOSAMINE 380 (H) 06/17/2022   FRUCTOSAMINE 303 (H) 10/19/2020   FRUCTOSAMINE 325 (H) 08/30/2019    Lab on 06/17/2022  Component Date Value Ref Range Status   Direct LDL 06/17/2022 83.0  mg/dL Final   Optimal:  <100 mg/dLNear or Above Optimal:  100-129 mg/dLBorderline High:  130-159 mg/dLHigh:  160-189 mg/dLVery High:  >190 mg/dL   Fructosamine 06/17/2022 380 (H)  0 - 285 umol/L Final   Comment: Published reference interval for apparently healthy subjects between age 23 and 4 is 7 - 285 umol/L and in a poorly controlled diabetic population is 228 - 563 umol/L with a mean of 396 umol/L.    Sodium 06/17/2022 141  135 - 145 mEq/L Final   Potassium 06/17/2022 4.2  3.5 - 5.1 mEq/L Final   Chloride 06/17/2022 103  96 - 112 mEq/L Final   CO2 06/17/2022 29  19 - 32 mEq/L Final   Glucose,  Bld 06/17/2022 217 (H)  70 - 99 mg/dL Final   BUN 06/17/2022 14  6 - 23 mg/dL Final   Creatinine, Ser 06/17/2022 1.42  0.40 - 1.50 mg/dL Final   GFR 06/17/2022 55.88 (L)  >60.00 mL/min Final   Calculated using the CKD-EPI Creatinine Equation (2021)   Calcium 06/17/2022 10.1  8.4 - 10.5 mg/dL Final    Allergies as of 06/19/2022   No Known Allergies      Medication List        Accurate as of June 19, 2022 11:38 AM. If you have any questions, ask your nurse or doctor.          atorvastatin 10 MG tablet Commonly known as: Lipitor Take 1 tablet (10 mg total) by mouth daily.   gabapentin 300 MG capsule Commonly known as: NEURONTIN TAKE 1 CAPSULE (300 MG TOTAL) BY MOUTH AT BEDTIME AS NEEDED.   losartan 50 MG tablet Commonly known as: COZAAR Take 1 tablet (50 mg total) by mouth daily.   metFORMIN 500 MG 24 hr tablet Commonly known as: GLUCOPHAGE-XR TAKE 3 TABLETS (1,500 MG TOTAL) BY MOUTH DAILY WITH SUPPER.   multivitamin with minerals Tabs tablet Take 1 tablet by mouth daily.   OneTouch Verio Flex System w/Device Kit by Does not apply route.   OneTouch Verio test strip Generic drug: glucose blood USE AS INSTRUCTED TO CHECK BLOOD SUGAR 2 TIMES DAILY..   Ozempic (0.25 or 0.5 MG/DOSE) 2 MG/3ML Sopn Generic drug: Semaglutide(0.25 or 0.5MG/DOS) Inject 0.5 mg into the skin once a week. Inject 0.38m for 2 weeks then 0.5 weekly What changed: additional instructions Changed by: AElayne Snare MD        Allergies: No Known Allergies  Past Medical History:  Diagnosis Date   [place]    Constipation    sometimes soft, some hard -- does go daily - are on fiber    Diabetes mellitus    GERD (gastroesophageal reflux disease)    Headache    Hyperlipidemia    Hypertension    Ne[place]rom[place] disorder [place]    neuropathy    Past Surgical History:  Procedure Laterality Date   LAPAROSCOPIC GASTRIC SLEEVE RESECTION N/A 09/05/2020    Procedure: LAPAROSCOPIC SLEEVE GASTRECTOMY;  Surgeon: WGreer Pickerel MD;  Location: [facility];  Service: General;  Laterality: N/A;   UPPER GASTROINTESTINAL ENDOSCOPY  2002   Dr MCollene Mares   UPPER GI ENDOSCOPY N/A 09/05/2020   Procedure: UPPER GI ENDOSCOPY;  Surgeon: WRedmond Pulling  Randall Hiss, MD;  Location: [facility];  Service: General;  Laterality: N/A;    Family History  Problem Relation Age of Onset   Diabetes Mellitus II Mother    CAD Mother    Prostate cancer Father    Prostate cancer Maternal Uncle    Prostate cancer Maternal Uncle    Colon cancer Neg Hx    Colon polyps Neg Hx     Social History:  reports that he has never smoked. He has never used smokeless tobacco. He reports that he does not drink alcohol and does not use drugs.   Review of Systems  Lipid history: Had been treated by PCP with 10 mg atorvastatin, now LDL 83 with taking this again  Last lipid panel:    Lab Results  Component Value Date   CHOL 166 03/15/2022   HDL 40.20 03/15/2022   LDLCALC 100 (H) 03/15/2022   LDLDIRECT 83.0 06/17/2022   TRIG 128.0 03/15/2022   CHOLHDL 4 03/15/2022           Hypertension: Taking 50 mg losartan   He has not checked blood pressure at home He thinks his blood pressure is higher today because of taking NyQuil last night Blood pressure was 88 diastolic when checked again  BP Readings from Last 3 Encounters:  06/19/22 122/90  03/19/22 130/84  10/23/21 (!) 158/82    He has been given gabapentin at night from his PCP for neuropathic pain  Currently known complications of diabetes: Neuropathy, erectile dysfunction, unknown status of retinopathy  Has a renal dysfunction of unclear etiology with variable creatinine  Lab Results  Component Value Date   CREATININE 1.42 06/17/2022   CREATININE 1.39 03/15/2022   CREATININE 1.26 (H) 10/18/2021    he has had hypercalcemia previously  Lab Results  Component Value Date   PTH 35 03/08/2021   CALCIUM 10.1 06/17/2022   CAION 1.23  05/04/2012     LABS:  Lab on 06/17/2022  Component Date Value Ref Range Status   Direct LDL 06/17/2022 83.0  mg/dL Final   Optimal:  <100 mg/dLNear or Above Optimal:  100-129 mg/dLBorderline High:  130-159 mg/dLHigh:  160-189 mg/dLVery High:  >190 mg/dL   Fructosamine 06/17/2022 380 (H)  0 - 285 umol/L Final   Comment: Published reference interval for apparently healthy subjects between age 70 and 9 is 61 - 285 umol/L and in a poorly controlled diabetic population is 228 - 563 umol/L with a mean of 396 umol/L.    Sodium 06/17/2022 141  135 - 145 mEq/L Final   Potassium 06/17/2022 4.2  3.5 - 5.1 mEq/L Final   Chloride 06/17/2022 103  96 - 112 mEq/L Final   CO2 06/17/2022 29  19 - 32 mEq/L Final   Glucose, Bld 06/17/2022 217 (H)  70 - 99 mg/dL Final   BUN 06/17/2022 14  6 - 23 mg/dL Final   Creatinine, Ser 06/17/2022 1.42  0.40 - 1.50 mg/dL Final   GFR 06/17/2022 55.88 (L)  >60.00 mL/min Final   Calculated using the CKD-EPI Creatinine Equation (2021)   Calcium 06/17/2022 10.1  8.4 - 10.5 mg/dL Final    Physical Examination:  BP 122/90   Pulse 95   Ht 6' 4" (1.93 m)   Wt 255 lb 9.6 oz (115.9 kg)   SpO2 97%   BMI 31.11 kg/m     ASSESSMENT:  Diabetes type 2  See history of present illness for discussion of current diabetes management, blood sugar patterns and problems identified  His A1c is last 8.9 and now about the same at 8.7  His blood sugars are still relatively high and fructosamine of 380 indicates inadequate control He is still not able to control his diet without Ozempic Also did not call to let us know he was out of the medication Likely has high readings after dinner which he does not monitor  HYPERTENSION: Blood pressure is still high but may be related to his taking a decongestant last night  Renal dysfunction: This is relatively stable   PLAN:    He will take Ozempic again Again asked him that he needs to start with 0.25 mg and likely can go up  to 0.5 mg after the first 2 injections and continue If he feels like he is getting higher sugars on the 0.5 he will let us know and try the 1 mg New prescription for Ozempic and also metformin sent  Continue Lipitor 10 mg daily  Continue losartan for hypertension and try to monitor blood pressure at the pharmacy at least We will recheck blood pressure again as it may be higher today from NyQuil  There are no Patient Instructions on file for this visit.    Elayne Snare 06/19/2022, 11:38 AM   Note: This office note was prepared with Dragon voice recognition system technology. Any transcriptional errors that result from this process are unintentional.

## 2022-06-19 NOTE — Patient Instructions (Signed)
Check blood sugars on waking up 3 days a week  Also check blood sugars about 2 hours after meals and do this after different meals by rotation  Recommended blood sugar levels on waking up are 90-130 and about 2 hours after meal is 130-160  Please bring your blood sugar monitor to each visit, thank you   

## 2022-07-15 ENCOUNTER — Other Ambulatory Visit: Payer: Self-pay | Admitting: Endocrinology

## 2022-07-29 ENCOUNTER — Telehealth: Payer: Self-pay | Admitting: Endocrinology

## 2022-07-29 DIAGNOSIS — E1165 Type 2 diabetes mellitus with hyperglycemia: Secondary | ICD-10-CM

## 2022-07-29 MED ORDER — ONETOUCH VERIO VI STRP: ORAL_STRIP | 3 refills | Status: DC

## 2022-07-29 NOTE — Telephone Encounter (Signed)
Rx sent to pharmacy   

## 2022-07-29 NOTE — Telephone Encounter (Signed)
MEDICATION: One Touch Verio Test Strips  PHARMACY:  [facility] on [address] PATIENT CONTACTED THEIR PHARMACY? yes   IS THIS A 90 DAY SUPPLY : unknown  IS PATIENT OUT OF MEDICATION: NO  IF NOT; HOW MUCH IS LEFT: 4 test strips left  LAST APPOINTMENT DATE: '@7'$ /17/2023  NEXT APPOINTMENT DATE:'@9'$ /12/2021  DO WE HAVE YOUR PERMISSION TO LEAVE A DETAILED MESSAGE?: yes  OTHER COMMENTS:    **Let patient know to contact pharmacy at the end of the day to make sure medication is ready. **  ** Please notify patient to allow 48-72 hours to process**  **Encourage patient to contact the pharmacy for refills or they can request refills through [facility]**

## 2022-08-13 ENCOUNTER — Other Ambulatory Visit: Payer: Self-pay | Admitting: Endocrinology

## 2022-08-30 ENCOUNTER — Other Ambulatory Visit: Payer: BC Managed Care – PPO

## 2022-09-04 ENCOUNTER — Ambulatory Visit: Payer: BC Managed Care – PPO | Admitting: Endocrinology

## 2022-11-11 ENCOUNTER — Other Ambulatory Visit: Payer: Self-pay | Admitting: Endocrinology

## 2022-12-02 ENCOUNTER — Other Ambulatory Visit (INDEPENDENT_AMBULATORY_CARE_PROVIDER_SITE_OTHER): Payer: BC Managed Care – PPO

## 2022-12-02 ENCOUNTER — Other Ambulatory Visit: Payer: Self-pay | Admitting: Endocrinology

## 2022-12-02 DIAGNOSIS — E1165 Type 2 diabetes mellitus with hyperglycemia: Secondary | ICD-10-CM

## 2022-12-02 LAB — BASIC METABOLIC PANEL
BUN: 12 mg/dL (ref 6–23)
CO2: 28 mEq/L (ref 19–32)
Calcium: 9.9 mg/dL (ref 8.4–10.5)
Chloride: 103 mEq/L (ref 96–112)
Creatinine, Ser: 1.46 mg/dL (ref 0.40–1.50)
GFR: 53.87 mL/min — ABNORMAL LOW (ref >60.00)
Glucose, Bld: 159 mg/dL — ABNORMAL HIGH (ref 70–99)
Potassium: 4 mEq/L (ref 3.5–5.1)
Sodium: 140 mEq/L (ref 135–145)

## 2022-12-03 LAB — HEMOGLOBIN A1C: Hgb A1c MFr Bld: 8.3 % — ABNORMAL HIGH (ref 4.6–6.5)

## 2022-12-03 LAB — FRUCTOSAMINE: Fructosamine: 307 umol/L — ABNORMAL HIGH (ref 0–285)

## 2022-12-05 ENCOUNTER — Ambulatory Visit: Payer: BC Managed Care – PPO | Admitting: Endocrinology

## 2022-12-16 ENCOUNTER — Ambulatory Visit: Payer: BC Managed Care – PPO | Admitting: Endocrinology

## 2022-12-16 VITALS — BP 142/84 | HR 97 | Ht 76.0 in | Wt 251.0 lb

## 2022-12-16 DIAGNOSIS — E1165 Type 2 diabetes mellitus with hyperglycemia: Secondary | ICD-10-CM

## 2022-12-16 MED ORDER — OZEMPIC (1 MG/DOSE) 4 MG/3ML ~~LOC~~ SOPN: PEN_INJECTOR | SUBCUTANEOUS | 2 refills | Status: DC

## 2022-12-16 NOTE — Progress Notes (Signed)
Patient ID: Raymond Ryan, male   DOB: [birth year], 56 y.o.   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 He thinks he had been able to keep his weight down with Victoza which he took only for short time in 2016 He has had no side effects with Victoza which was stopped because of his occupation also  Recent history:    Non-insulin hypoglycemic drugs: Metformin 500 mg, 2 daily, Ozempic 0.5 mg weekly  His A1c is gradually improving, now 8.3, lowest reading before 7.3  Current management, blood sugar patterns and problems identified: 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  He took Ozempic for only about 4 or 5 injections and he did not increase it to 0.5 as directed, also did not get it refilled and he says that he did not have any further prescriptions, did not notify us about needing a refill His blood sugars are likely still high and fructosamine of 307 is still high but improved He has had occasional difficulty with getting Ozempic filled but not for more than 3 weeks He still thinks that he is sometimes will tend to have more snacks Likely has high readings after dinner which he does not monitor He still has relatively higher fasting readings His weight is down 4 pounds Again not checking sugars after dinner He says he is very busy with long working hours and not finding the time to exercise        Dinner 7 pm  Side effects from medications have been: Diarrhea from regular metformin       Glucose monitoring:  done  1-2 times a day         Glucometer:   One Touch Verio    Blood Glucose readings by time of day from download for the last 30 days   PRE-MEAL Fasting Lunch Dinner Bedtime Overall  Glucose range: 130-143  100-119  100-187  Mean/median: 135    136   POST-MEAL PC Breakfast PC Lunch PC Dinner  Glucose range: 128-187  ?  148  Mean/median: 148     Prior readings:  PRE-MEAL Fasting Lunch Dinner Bedtime Overall  Glucose range: 182-202  124-212  124-212  Mean/median:        POST-MEAL PC Breakfast PC Lunch PC Dinner  Glucose range:   ?  Mean/median:        Dietician visit, most recent: Never  Weight history:  Wt Readings from Last 3 Encounters:  12/16/22 251 lb (113.9 kg)  06/19/22 255 lb 9.6 oz (115.9 kg)  03/19/22 256 lb 6.4 oz (116.3 kg)    Glycemic control:   Lab Results  Component Value Date   HGBA1C 8.3 (H) 12/02/2022   HGBA1C 8.7 (A) 06/19/2022   HGBA1C 8.9 (H) 03/15/2022   Lab Results  Component Value Date   MICROALBUR 5.7 (H) 03/15/2022   LDLCALC 100 (H) 03/15/2022   CREATININE 1.46 12/02/2022  Lab Results  Component Value Date   MICRALBCREAT 3.4 03/15/2022    Lab Results  Component Value Date   FRUCTOSAMINE 307 (H) 12/02/2022   FRUCTOSAMINE 380 (H) 06/17/2022   FRUCTOSAMINE 303 (H) 10/19/2020    No visits with results within 1 Week(s) from this visit.  Latest known visit with results is:  Lab on 12/02/2022  Component Date Value Ref Range Status   Sodium 12/02/2022 140  135 - 145 mEq/L Final   Potassium 12/02/2022 4.0  3.5 - 5.1 mEq/L Final   Chloride 12/02/2022 103  96 - 112 mEq/L Final   CO2 12/02/2022 28  19 - 32 mEq/L Final   Glucose, Bld 12/02/2022 159 (H)  70 - 99 mg/dL Final   BUN 12/02/2022 12  6 - 23 mg/dL Final   Creatinine, Ser 12/02/2022 1.46  0.40 - 1.50 mg/dL Final   GFR 12/02/2022 53.87 (L)  >60.00 mL/min Final   Calculated using the CKD-EPI Creatinine Equation (2021)   Calcium 12/02/2022 9.9  8.4 - 10.5 mg/dL Final    Fructosamine 12/02/2022 307 (H)  0 - 285 umol/L Final   Comment: Published reference interval for apparently healthy subjects between age 80 and 23 is 45 - 285 umol/L and in a poorly controlled diabetic population is 228 - 563 umol/L with a mean of 396 umol/L.    Hgb A1c MFr Bld 12/02/2022 8.3 (H)  4.6 - 6.5 % Final   Glycemic Control Guidelines for People with Diabetes:Non Diabetic:  <6%Goal of Therapy: <7%Additional Action Suggested:  >8%     Allergies as of 12/16/2022   No Known Allergies      Medication List        Accurate as of December 16, 2022 12:57 PM. If you have any questions, ask your nurse or doctor.          STOP taking these medications    Ozempic (0.25 or 0.5 MG/DOSE) 2 MG/3ML Sopn Generic drug: Semaglutide(0.25 or 0.5MG/DOS) Replaced by: Ozempic (1 MG/DOSE) 4 MG/3ML Sopn       TAKE these medications    atorvastatin 10 MG tablet Commonly known as: Lipitor Take 1 tablet (10 mg total) by mouth daily.   gabapentin 300 MG capsule Commonly known as: NEURONTIN TAKE 1 CAPSULE (300 MG TOTAL) BY MOUTH AT BEDTIME AS NEEDED.   losartan 50 MG tablet Commonly known as: COZAAR TAKE 1 TABLET BY MOUTH EVERY DAY   metFORMIN 500 MG 24 hr tablet Commonly known as: GLUCOPHAGE-XR Take 2 tablets (1,000 mg total) by mouth daily with supper.   multivitamin with minerals Tabs tablet Take 1 tablet by mouth daily.   OneTouch Verio Flex System w/Device Kit by Does not apply route.   OneTouch Verio test strip Generic drug: glucose blood USE AS INSTRUCTED TO CHECK BLOOD SUGAR 2 TIMES DAILY..   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 ([place])    Constipation    sometimes soft, some hard -- does go daily - are on fiber    Diabetes mellitus     GERD (gastroesophageal reflux disease)    Headache    Hyperlipidemia    Hypertension    Neuromuscular disorder ([place])    neuropathy    Past Surgical History:  Procedure Laterality  Date   LAPAROSCOPIC GASTRIC SLEEVE RESECTION N/A 09/05/2020   Procedure: LAPAROSCOPIC SLEEVE GASTRECTOMY;  Surgeon: Greer Pickerel, MD;  Location: [facility];  Service: General;  Laterality: N/A;   UPPER GASTROINTESTINAL ENDOSCOPY  2002   Dr Collene Mares    UPPER GI ENDOSCOPY N/A 09/05/2020   Procedure: UPPER GI ENDOSCOPY;  Surgeon: Greer Pickerel, MD;  Location: [facility];  Service: General;  Laterality: N/A;    Family History  Problem Relation Age of Onset   Diabetes Mellitus II Mother    CAD Mother    Prostate cancer Father    Prostate cancer Maternal Uncle    Prostate cancer Maternal Uncle    Colon cancer Neg Hx    Colon polyps Neg Hx     Social History:  reports that he has never smoked. He has never used smokeless tobacco. He reports that he does not drink alcohol and does not use drugs.   Review of Systems  Lipid history: Had been treated by PCP with 10 mg atorvastatin,  LDL 83 with taking this   Last lipid panel:    Lab Results  Component Value Date   CHOL 166 03/15/2022   HDL 40.20 03/15/2022   LDLCALC 100 (H) 03/15/2022   LDLDIRECT 83.0 06/17/2022   TRIG 128.0 03/15/2022   CHOLHDL 4 03/15/2022           Hypertension: Taking 50 mg losartan    BP Readings from Last 3 Encounters:  12/16/22 (!) 142/84  06/19/22 122/90  03/19/22 130/84    He has been given gabapentin at night from his PCP for neuropathic pain  Currently known complications of diabetes: Neuropathy, erectile dysfunction, unknown status of retinopathy  Has a renal dysfunction of unclear etiology with variable creatinine  Lab Results  Component Value Date   CREATININE 1.46 12/02/2022   CREATININE 1.42 06/17/2022   CREATININE 1.39 03/15/2022    he has had hypercalcemia previously  Lab Results  Component Value Date   PTH  35 03/08/2021   CALCIUM 9.9 12/02/2022   CAION 1.23 05/04/2012     LABS:  No visits with results within 1 Week(s) from this visit.  Latest known visit with results is:  Lab on 12/02/2022  Component Date Value Ref Range Status   Sodium 12/02/2022 140  135 - 145 mEq/L Final   Potassium 12/02/2022 4.0  3.5 - 5.1 mEq/L Final   Chloride 12/02/2022 103  96 - 112 mEq/L Final   CO2 12/02/2022 28  19 - 32 mEq/L Final   Glucose, Bld 12/02/2022 159 (H)  70 - 99 mg/dL Final   BUN 12/02/2022 12  6 - 23 mg/dL Final   Creatinine, Ser 12/02/2022 1.46  0.40 - 1.50 mg/dL Final   GFR 12/02/2022 53.87 (L)  >60.00 mL/min Final   Calculated using the CKD-EPI Creatinine Equation (2021)   Calcium 12/02/2022 9.9  8.4 - 10.5 mg/dL Final   Fructosamine 12/02/2022 307 (H)  0 - 285 umol/L Final   Comment: Published reference interval for apparently healthy subjects between age 76 and 62 is 7 - 285 umol/L and in a poorly controlled diabetic population is 228 - 563 umol/L with a mean of 396 umol/L.    Hgb A1c MFr Bld 12/02/2022 8.3 (H)  4.6 - 6.5 % Final   Glycemic Control Guidelines for People with Diabetes:Non Diabetic:  <6%Goal of Therapy: <7%Additional Action Suggested:  >8%     Physical Examination:  BP (!) 142/84   Pulse 97   Ht 6'  4" (1.93 m)   Wt 251 lb (113.9 kg)   SpO2 97%   BMI 30.55 kg/m     ASSESSMENT:  Diabetes type 2  See history of present illness for discussion of current diabetes management, blood sugar patterns and problems identified  His A1c is still not adequate at 8.3  Currently only on 0.5 Ozempic with 1 g metformin  His blood sugars may be higher after dinner when he does not monitor but overall average blood sugar is 136 at home with relatively higher fasting readings   PLAN:    He will take Ozempic 1 mg now and continue to take this regularly Encouraged him to start exercise More frequent monitoring after dinner More regular follow-up  There are no  Patient Instructions on file for this visit.    Elayne Snare 12/16/2022, 12:57 PM   Note: This office note was prepared with Dragon voice recognition system technology. Any transcriptional errors that result from this process are unintentional.

## 2023-02-06 ENCOUNTER — Other Ambulatory Visit: Payer: Self-pay | Admitting: Endocrinology

## 2023-03-12 ENCOUNTER — Other Ambulatory Visit (INDEPENDENT_AMBULATORY_CARE_PROVIDER_SITE_OTHER): Payer: BC Managed Care – PPO

## 2023-03-12 DIAGNOSIS — E1165 Type 2 diabetes mellitus with hyperglycemia: Secondary | ICD-10-CM

## 2023-03-12 LAB — BASIC METABOLIC PANEL
BUN: 17 mg/dL (ref 6–23)
CO2: 30 mEq/L (ref 19–32)
Calcium: 10.2 mg/dL (ref 8.4–10.5)
Chloride: 103 mEq/L (ref 96–112)
Creatinine, Ser: 1.45 mg/dL (ref 0.40–1.50)
GFR: 54.21 mL/min — ABNORMAL LOW (ref >60.00)
Glucose, Bld: 120 mg/dL — ABNORMAL HIGH (ref 70–99)
Potassium: 4.5 mEq/L (ref 3.5–5.1)
Sodium: 142 mEq/L (ref 135–145)

## 2023-03-12 LAB — HEMOGLOBIN A1C: Hgb A1c MFr Bld: 8.6 % — ABNORMAL HIGH (ref 4.6–6.5)

## 2023-03-13 ENCOUNTER — Encounter: Payer: Self-pay | Admitting: Internal Medicine

## 2023-03-13 ENCOUNTER — Ambulatory Visit (INDEPENDENT_AMBULATORY_CARE_PROVIDER_SITE_OTHER): Payer: BC Managed Care – PPO | Admitting: Internal Medicine

## 2023-03-13 VITALS — BP 128/74 | HR 99 | Temp 99.5°F | Wt 249.0 lb

## 2023-03-13 DIAGNOSIS — L84 Corns and callosities: Secondary | ICD-10-CM

## 2023-03-13 DIAGNOSIS — E7800 Pure hypercholesterolemia, unspecified: Secondary | ICD-10-CM | POA: Diagnosis not present

## 2023-03-13 DIAGNOSIS — N401 Enlarged prostate with lower urinary tract symptoms: Secondary | ICD-10-CM

## 2023-03-13 DIAGNOSIS — Z125 Encounter for screening for malignant neoplasm of prostate: Secondary | ICD-10-CM | POA: Diagnosis not present

## 2023-03-13 DIAGNOSIS — E559 Vitamin D deficiency, unspecified: Secondary | ICD-10-CM | POA: Diagnosis not present

## 2023-03-13 DIAGNOSIS — E538 Deficiency of other specified B group vitamins: Secondary | ICD-10-CM

## 2023-03-13 DIAGNOSIS — Z0001 Encounter for general adult medical examination with abnormal findings: Secondary | ICD-10-CM

## 2023-03-13 DIAGNOSIS — R3912 Poor urinary stream: Secondary | ICD-10-CM

## 2023-03-13 DIAGNOSIS — N182 Chronic kidney disease, stage 2 (mild): Secondary | ICD-10-CM

## 2023-03-13 DIAGNOSIS — N1831 Chronic kidney disease, stage 3a: Secondary | ICD-10-CM

## 2023-03-13 DIAGNOSIS — I10 Essential (primary) hypertension: Secondary | ICD-10-CM

## 2023-03-13 DIAGNOSIS — Z760 Encounter for issue of repeat prescription: Secondary | ICD-10-CM

## 2023-03-13 DIAGNOSIS — E1122 Type 2 diabetes mellitus with diabetic chronic kidney disease: Secondary | ICD-10-CM

## 2023-03-13 DIAGNOSIS — M14671 Charcot's joint, right ankle and foot: Secondary | ICD-10-CM

## 2023-03-13 MED ORDER — TAMSULOSIN HCL 0.4 MG PO CAPS: 0.4000 mg | ORAL_CAPSULE | Freq: Every day | ORAL | 3 refills | Status: AC

## 2023-03-13 MED ORDER — GABAPENTIN 300 MG PO CAPS: 300.0000 mg | ORAL_CAPSULE | Freq: Every evening | ORAL | 1 refills | Status: DC | PRN

## 2023-03-13 NOTE — Patient Instructions (Signed)
Please take all new medication as prescribed  - the flomax for the prostate (but ok to stop this if you dont think it is helping the urination)  Please continue all other medications as before, and refills have been done if requested - gabapentin  Please have the pharmacy call with any other refills you may need.  Please continue your efforts at being more active, low cholesterol diet, and weight control.  You are otherwise up to date with prevention measures today.  Please keep your appointments with your specialists as you may have planned  Please see Sports Medicine on the first floor for the left shoulder pain  You will be contacted regarding the referral for: Dr Doran Durand ortho (unless you get an appt sooner)  Please go to the LAB at the blood drawing area for the tests to be done  You will be contacted by phone if any changes need to be made immediately.  Otherwise, you will receive a letter about your results with an explanation, but please check with MyChart first.  Please remember to sign up for MyChart if you have not done so, as this will be important to you in the future with finding out test results, communicating by private email, and scheduling acute appointments online when needed.  Please make an Appointment to return in 6 months, or sooner if needed

## 2023-03-13 NOTE — Progress Notes (Signed)
Chief Complaint:: wellness exam and Annual Exam (-Left shoulder pain/-cut under right foot)  , neuropathy, dm, bph, ckd3a       HPI:  Raymond Ryan is a 57 y.o. male here for wellness exam; pt to call for eye exam soon, declines tdap, covid booster, flu shot, o/w up to date                        Also has ongoing neuropathic pain, worse since ran out of gabapentin.  Has ongoing right charcot foot and now corn with tissue breakdown around it in the past 2 wks.  Sees Endo for DM but not recently.  Has pain to left anterior shoulder, worse to palpate and raise the arm.  Denies urinary symptoms such as dysuria, frequency, urgency, flank pain, hematuria or n/v, fever, chills, but has some hesitany and nocturia, slower stream in the past few months.  Also see per renal with stable renal fxn recently   Wt Readings from Last 3 Encounters:  03/13/23 249 lb (112.9 kg)  12/16/22 251 lb (113.9 kg)  06/19/22 255 lb 9.6 oz (115.9 kg)   BP Readings from Last 3 Encounters:  03/13/23 128/74  12/16/22 (!) 142/84  06/19/22 122/90   Immunization History  Administered Date(s) Administered   Influenza Inj Mdck Quad Pf 11/17/2017, 10/03/2018   Influenza Split 11/02/2012   Influenza,inj,Quad PF,6+ Mos 11/15/2015, 08/19/2019   Influenza-Unspecified 10/03/2018, 09/02/2019   PFIZER(Purple Top)SARS-COV-2 Vaccination 03/20/2020, 04/03/2020   Pneumococcal Polysaccharide-23 11/02/2012   Tdap 11/02/2012   Zoster Recombinat (Shingrix) 02/19/2021, 04/29/2021   Health Maintenance Due  Topic Date Due   OPHTHALMOLOGY EXAM  04/29/2019   DTaP/Tdap/Td (2 - Td or Tdap) 11/02/2022      Past Medical History:  Diagnosis Date   Charcot foot due to diabetes mellitus ([place])    Constipation    sometimes soft, some hard -- does go daily - are on fiber    Diabetes mellitus    GERD (gastroesophageal reflux disease)    Headache    Hyperlipidemia    Hypertension    Neuromuscular disorder ([place])    neuropathy    Past Surgical History:  Procedure Laterality Date   LAPAROSCOPIC GASTRIC SLEEVE RESECTION N/A 09/05/2020   Procedure: LAPAROSCOPIC SLEEVE GASTRECTOMY;  Surgeon: Greer Pickerel, MD;  Location: [facility];  Service: General;  Laterality: N/A;   UPPER GASTROINTESTINAL ENDOSCOPY  2002   Dr Collene Mares    UPPER GI ENDOSCOPY N/A 09/05/2020   Procedure: UPPER GI ENDOSCOPY;  Surgeon: Greer Pickerel, MD;  Location: [facility];  Service: General;  Laterality: N/A;    reports that he has never smoked. He has never used smokeless tobacco. He reports that he does not drink alcohol and does not use drugs. family history includes CAD in his mother; Diabetes Mellitus II in his mother; Prostate cancer in his father, maternal uncle, and maternal uncle. No Known Allergies Current Outpatient Medications on File Prior to Visit  Medication Sig Dispense Refill   Blood Glucose Monitoring Suppl ([place]) w/Device KIT by Does not apply route.     glipiZIDE (GLUCOTROL) 10 MG tablet Take by mouth.     glucose blood (ONETOUCH VERIO) test strip USE AS INSTRUCTED TO CHECK BLOOD SUGAR 2 TIMES DAILY.. 100 strip 3   lisinopril-hydrochlorothiazide (ZESTORETIC) 20-25 MG tablet Take 1 tablet by mouth daily.     losartan (COZAAR) 50 MG tablet TAKE 1 TABLET BY  MOUTH EVERY DAY 90 tablet 0   metFORMIN (GLUCOPHAGE-XR) 500 MG 24 hr tablet Take 2 tablets (1,000 mg total) by mouth daily with supper. 180 tablet 2   Multiple Vitamin (MULTIVITAMIN WITH MINERALS) TABS tablet Take 1 tablet by mouth daily.     Semaglutide, 1 MG/DOSE, (OZEMPIC, 1 MG/DOSE,) 4 MG/3ML SOPN Inject 1 mg weekly 3 mL 2   No current facility-administered medications on file prior to visit.        ROS:  All others reviewed and negative.  Objective        PE:  BP 128/74   Pulse 99   Temp 99.5 F (37.5 C) (Oral)   Wt 249 lb (112.9 kg)   SpO2 97%   BMI 30.31 kg/m                 Constitutional: Pt appears in NAD               HENT: Head: NCAT.                 Right Ear: External ear normal.                 Left Ear: External ear normal.                Eyes: . Pupils are equal, round, and reactive to light. Conjunctivae and EOM are normal               Nose: without d/c or deformity               Neck: Neck supple. Gross normal ROM               Cardiovascular: Normal rate and regular rhythm.                 Pulmonary/Chest: Effort normal and breath sounds without rales or wheezing.                Abd:  Soft, NT, ND, + BS, no organomegaly               Neurological: Pt is alert. At baseline orientation, motor grossly intact               Skin: Skin is warm, LE edema - none, right charcot foot with distal plantar corn lesion with tissue breakdown slight around it, no cellulitis or draiange               Psychiatric: Pt behavior is normal without agitation   Micro: none  Cardiac tracings I have personally interpreted today:  none  Pertinent Radiological findings (summarize): none   Lab Results  Component Value Date   WBC 5.1 03/14/2023   HGB 16.1 03/14/2023   HCT 47.7 03/14/2023   PLT 454.0 (H) 03/14/2023   GLUCOSE 120 (H) 03/12/2023   CHOL 185 03/14/2023   TRIG 113.0 03/14/2023   HDL 40.60 03/14/2023   LDLDIRECT 83.0 06/17/2022   LDLCALC 121 (H) 03/14/2023   ALT 9 03/14/2023   AST 11 03/14/2023   NA 142 03/12/2023   K 4.5 03/12/2023   CL 103 03/12/2023   CREATININE 1.45 03/12/2023   BUN 17 03/12/2023   CO2 30 03/12/2023   TSH 1.63 03/14/2023   PSA 1.48 03/14/2023   HGBA1C 8.6 (H) 03/12/2023   MICROALBUR 13.5 (H) 03/14/2023   Assessment/Plan:  Raymond Ryan is a 57 y.o. Black or African American [2] male with  has a past medical  history of Charcot foot due to diabetes mellitus ([place]), Constipation, Diabetes mellitus, GERD (gastroesophageal reflux disease), Headache, Hyperlipidemia, Hypertension, and Neuromuscular disorder ([place]).  Encounter for well adult exam with abnormal findings Age and sex appropriate education and  counseling updated with regular exercise and diet Referrals for preventative services - pt to call for eye exam soon Immunizations addressed - declines tdap, covid booster, flu shot Smoking counseling  - none needed Evidence for depression or other mood disorder - none significant Most recent labs reviewed. I have personally reviewed and have noted: 1) the patient's medical and social history 2) The patient's current medications and supplements 3) The patient's height, weight, and BMI have been recorded in the chart   Charcot's joint of right foot For ortho referral Dr Doran Durand  CKD (chronic kidney disease) stage 3, GFR 30-59 ml/min Lab Results  Component Value Date   CREATININE 1.45 03/12/2023   Stable overall, cont to avoid nephrotoxins  Corn of foot Plantar distal foot with tissue breakdown around it - for ortho Dr Doran Durand referral  Type 2 diabetes mellitus with stage 2 chronic kidney disease, without long-term current use of insulin ([facility]) Lab Results  Component Value Date   HGBA1C 8.6 (H) 03/12/2023   Unocntrolled,, pt to continue current medical treatment glipizde 10 mg qd, metformin ER 500 mg- 2 qd, ozempic 1 mg weekly and f/u endo per pt preference   Hyperlipidemia Lab Results  Component Value Date   LDLCALC 121 (H) 03/14/2023   Uncontrolled, pt to increase lipitor 20 mg qd   HTN (hypertension) BP Readings from Last 3 Encounters:  03/13/23 128/74  12/16/22 (!) 142/84  06/19/22 122/90   Stable, pt to continue medical treatment zestoretic 20 - 25 mg qd   BPH (benign prostatic hyperplasia) Mild worsening recent, for trial flomax 0.4 d Followup: Return in about 6 months (around 09/13/2023).  [name], MD 03/15/2023 7:46 PM [facility]

## 2023-03-14 ENCOUNTER — Other Ambulatory Visit: Payer: Self-pay | Admitting: Internal Medicine

## 2023-03-14 LAB — CBC WITH DIFFERENTIAL/PLATELET
Basophils Absolute: 0.1 10*3/uL (ref 0.0–0.1)
Basophils Relative: 1.9 % (ref 0.0–3.0)
Eosinophils Absolute: 0.1 10*3/uL (ref 0.0–0.7)
Eosinophils Relative: 2.5 % (ref 0.0–5.0)
HCT: 47.7 % (ref 39.0–52.0)
Hemoglobin: 16.1 g/dL (ref 13.0–17.0)
Lymphocytes Relative: 31 % (ref 12.0–46.0)
Lymphs Abs: 1.6 10*3/uL (ref 0.7–4.0)
MCHC: 33.7 g/dL (ref 30.0–36.0)
MCV: 87 fl (ref 78.0–100.0)
Monocytes Absolute: 0.4 10*3/uL (ref 0.1–1.0)
Monocytes Relative: 7.7 % (ref 3.0–12.0)
Neutro Abs: 2.9 10*3/uL (ref 1.4–7.7)
Neutrophils Relative %: 56.9 % (ref 43.0–77.0)
Platelets: 454 10*3/uL — ABNORMAL HIGH (ref 150.0–400.0)
RBC: 5.49 Mil/uL (ref 4.22–5.81)
RDW: 14.6 % (ref 11.5–15.5)
WBC: 5.1 10*3/uL (ref 4.0–10.5)

## 2023-03-14 LAB — URINALYSIS, ROUTINE W REFLEX MICROSCOPIC
Bilirubin Urine: NEGATIVE
Hgb urine dipstick: NEGATIVE
Leukocytes,Ua: NEGATIVE
Nitrite: NEGATIVE
Specific Gravity, Urine: >=1.03 — AB (ref 1.000–1.030)
Total Protein, Urine: 30 — AB
Urine Glucose: NEGATIVE
Urobilinogen, UA: 0.2 (ref 0.0–1.0)
pH: 6 (ref 5.0–8.0)

## 2023-03-14 LAB — HEPATIC FUNCTION PANEL
ALT: 9 U/L (ref 0–53)
AST: 11 U/L (ref 0–37)
Albumin: 4.4 g/dL (ref 3.5–5.2)
Alkaline Phosphatase: 68 U/L (ref 39–117)
Bilirubin, Direct: 0.1 mg/dL (ref 0.0–0.3)
Total Bilirubin: 0.6 mg/dL (ref 0.2–1.2)
Total Protein: 7.7 g/dL (ref 6.0–8.3)

## 2023-03-14 LAB — LIPID PANEL
Cholesterol: 185 mg/dL (ref 0–200)
HDL: 40.6 mg/dL (ref >39.00)
LDL Cholesterol: 121 mg/dL — ABNORMAL HIGH (ref 0–99)
NonHDL: 143.93
Total CHOL/HDL Ratio: 5
Triglycerides: 113 mg/dL (ref 0.0–149.0)
VLDL: 22.6 mg/dL (ref 0.0–40.0)

## 2023-03-14 LAB — VITAMIN B12: Vitamin B-12: 372 pg/mL (ref 211–911)

## 2023-03-14 LAB — MICROALBUMIN / CREATININE URINE RATIO
Creatinine,U: 244.8 mg/dL
Microalb Creat Ratio: 5.5 mg/g (ref 0.0–30.0)
Microalb, Ur: 13.5 mg/dL — ABNORMAL HIGH (ref 0.0–1.9)

## 2023-03-14 LAB — PSA: PSA: 1.48 ng/mL (ref 0.10–4.00)

## 2023-03-14 LAB — VITAMIN D 25 HYDROXY (VIT D DEFICIENCY, FRACTURES): VITD: 58.22 ng/mL (ref 30.00–100.00)

## 2023-03-14 LAB — TSH: TSH: 1.63 u[IU]/mL (ref 0.35–5.50)

## 2023-03-14 MED ORDER — ATORVASTATIN CALCIUM 20 MG PO TABS: 20.0000 mg | ORAL_TABLET | Freq: Every day | ORAL | 3 refills | Status: DC

## 2023-03-15 ENCOUNTER — Encounter: Payer: Self-pay | Admitting: Internal Medicine

## 2023-03-15 DIAGNOSIS — Z0001 Encounter for general adult medical examination with abnormal findings: Secondary | ICD-10-CM | POA: Insufficient documentation

## 2023-03-15 DIAGNOSIS — L84 Corns and callosities: Secondary | ICD-10-CM | POA: Insufficient documentation

## 2023-03-15 DIAGNOSIS — N400 Enlarged prostate without lower urinary tract symptoms: Secondary | ICD-10-CM | POA: Insufficient documentation

## 2023-03-15 NOTE — Assessment & Plan Note (Signed)
Lab Results  Component Value Date   HGBA1C 8.6 (H) 03/12/2023   Unocntrolled,, pt to continue current medical treatment glipizde 10 mg qd, metformin ER 500 mg- 2 qd, ozempic 1 mg weekly and f/u endo per pt preference

## 2023-03-15 NOTE — Assessment & Plan Note (Signed)
Mild worsening recent, for trial flomax 0.4 d

## 2023-03-15 NOTE — Assessment & Plan Note (Signed)
For ortho referral Dr Doran Durand

## 2023-03-15 NOTE — Assessment & Plan Note (Signed)
Lab Results  Component Value Date   LDLCALC 121 (H) 03/14/2023   Uncontrolled, pt to increase lipitor 20 mg qd

## 2023-03-15 NOTE — Assessment & Plan Note (Signed)
Plantar distal foot with tissue breakdown around it - for ortho Dr Doran Durand referral

## 2023-03-15 NOTE — Assessment & Plan Note (Signed)
Age and sex appropriate education and counseling updated with regular exercise and diet Referrals for preventative services - pt to call for eye exam soon Immunizations addressed - declines tdap, covid booster, flu shot Smoking counseling  - none needed Evidence for depression or other mood disorder - none significant Most recent labs reviewed. I have personally reviewed and have noted: 1) the patient's medical and social history 2) The patient's current medications and supplements 3) The patient's height, weight, and BMI have been recorded in the chart

## 2023-03-15 NOTE — Assessment & Plan Note (Signed)
BP Readings from Last 3 Encounters:  03/13/23 128/74  12/16/22 (!) 142/84  06/19/22 122/90   Stable, pt to continue medical treatment zestoretic 20 - 25 mg qd

## 2023-03-15 NOTE — Assessment & Plan Note (Signed)
Lab Results  Component Value Date   CREATININE 1.45 03/12/2023   Stable overall, cont to avoid nephrotoxins

## 2023-03-18 ENCOUNTER — Encounter: Payer: Self-pay | Admitting: Endocrinology

## 2023-03-18 ENCOUNTER — Ambulatory Visit: Payer: BC Managed Care – PPO | Admitting: Endocrinology

## 2023-03-18 VITALS — BP 116/72 | HR 98 | Ht 76.0 in | Wt 247.0 lb

## 2023-03-18 DIAGNOSIS — E7800 Pure hypercholesterolemia, unspecified: Secondary | ICD-10-CM | POA: Diagnosis not present

## 2023-03-18 DIAGNOSIS — E1165 Type 2 diabetes mellitus with hyperglycemia: Secondary | ICD-10-CM

## 2023-03-18 MED ORDER — OZEMPIC (2 MG/DOSE) 8 MG/3ML ~~LOC~~ SOPN: PEN_INJECTOR | SUBCUTANEOUS | 3 refills | Status: DC

## 2023-03-18 NOTE — Progress Notes (Signed)
Patient ID: Raymond Ryan, male   DOB: [birth year], 57 y.o.   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 He thinks he had been able to keep his weight down with Victoza which he took only for short time in 2016 He has had no side effects with Victoza which was stopped because of his occupation also  Recent history:    Non-insulin hypoglycemic drugs: Metformin 500 mg, 2 daily, Ozempic 1 mg weekly  His A1c is not improved at 8.6, lowest reading before 7.3  Current management, blood sugar patterns and problems identified: 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  He has been able to restart Ozempic about 2 months ago and has had no side effects from this so far However has only lost 4 pounds since December  Although he is checking his blood sugars his meter had the wrong date programmed Again checking mostly fasting readings He does not have any significantly high readings, mostly in the 120-130 range He does think that he has been keeping his portions small with 1 mg dose of Ozempic He says he is getting tired with long working hours and not finding the time to exercise        Dinner usually at 7 pm  Side effects from medications have been: Diarrhea from regular metformin       Glucose monitoring:  done 1-2 times a day         Glucometer:   One Touch Verio    Blood Glucose readings by review of monitor as above Previous readings:   PRE-MEAL Fasting Lunch Dinner Bedtime Overall  Glucose range: 130-143  100-119   100-187  Mean/median: 135    136   POST-MEAL PC Breakfast PC Lunch PC Dinner  Glucose range: 128-187  ?  148  Mean/median: 148      Dietician visit, most recent: Never  Weight history:  Wt Readings from Last 3 Encounters:  03/18/23 247 lb (112 kg)  03/13/23 249 lb (112.9 kg)  12/16/22 251 lb (113.9 kg)    Glycemic control:   Lab Results  Component Value Date   HGBA1C 8.6 (H) 03/12/2023   HGBA1C 8.3 (H) 12/02/2022   HGBA1C 8.7 (A) 06/19/2022   Lab Results  Component Value Date   MICROALBUR 13.5 (H) 03/14/2023   LDLCALC 121 (H) 03/14/2023   CREATININE 1.45 03/12/2023   Lab Results  Component Value Date   MICRALBCREAT 5.5 03/14/2023    Lab Results  Component Value Date   FRUCTOSAMINE 307 (H) 12/02/2022   FRUCTOSAMINE 380 (H) 06/17/2022   FRUCTOSAMINE 303 (H) 10/19/2020    Office Visit on 03/13/2023  Component Date Value Ref Range Status   Vitamin B-12 03/14/2023 372  211 - 911 pg/mL Final   VITD 03/14/2023 58.22  30.00 - 100.00 ng/mL Final   PSA 03/14/2023 1.48  0.10 - 4.00 ng/mL Final   Test performed using Access Hybritech  PSA Assay, a parmagnetic partical, chemiluminecent immunoassay.   Color, Urine 03/14/2023 YELLOW  Yellow;Lt. Yellow;Straw;Dark Yellow;Amber;Green;Red;Brown Final   APPearance 03/14/2023 CLEAR  Clear;Turbid;Slightly Cloudy;Cloudy Final   Specific Gravity, Urine 03/14/2023 >=1.030 (A)  1.000 - 1.030 Final   pH 03/14/2023 6.0  5.0 - 8.0 Final   Total Protein, Urine 03/14/2023 30 (A)  Negative Final   Urine Glucose 03/14/2023 NEGATIVE  Negative Final   Ketones, ur 03/14/2023 TRACE (A)  Negative Final   Bilirubin Urine 03/14/2023 NEGATIVE  Negative Final   Hgb urine dipstick 03/14/2023 NEGATIVE  Negative Final   Urobilinogen, UA 03/14/2023 0.2  0.0 - 1.0 Final   Leukocytes,Ua 03/14/2023 NEGATIVE  Negative Final   Nitrite 03/14/2023 NEGATIVE  Negative Final   WBC, UA 03/14/2023 0-2/hpf  0-2/hpf Final   RBC / HPF 03/14/2023 0-2/hpf  0-2/hpf  Final   Mucus, UA 03/14/2023 Presence of (A)  None Final   Squamous Epithelial / HPF 03/14/2023 Rare(0-4/hpf)  Rare(0-4/hpf) Final   Bacteria, UA 03/14/2023 Few(10-50/hpf) (A)  None Final   TSH 03/14/2023 1.63  0.35 - 5.50 uIU/mL Final   WBC 03/14/2023 5.1  4.0 - 10.5 K/uL Final   RBC 03/14/2023 5.49  4.22 - 5.81 Mil/uL Final   Hemoglobin 03/14/2023 16.1  13.0 - 17.0 g/dL Final   HCT 03/14/2023 47.7  39.0 - 52.0 % Final   MCV 03/14/2023 87.0  78.0 - 100.0 fl Final   MCHC 03/14/2023 33.7  30.0 - 36.0 g/dL Final   RDW 03/14/2023 14.6  11.5 - 15.5 % Final   Platelets 03/14/2023 454.0 (H)  150.0 - 400.0 K/uL Final   Neutrophils Relative % 03/14/2023 56.9  43.0 - 77.0 % Final   Lymphocytes Relative 03/14/2023 31.0  12.0 - 46.0 % Final   Monocytes Relative 03/14/2023 7.7  3.0 - 12.0 % Final   Eosinophils Relative 03/14/2023 2.5  0.0 - 5.0 % Final   Basophils Relative 03/14/2023 1.9  0.0 - 3.0 % Final   Neutro Abs 03/14/2023 2.9  1.4 - 7.7 K/uL Final   Lymphs Abs 03/14/2023 1.6  0.7 - 4.0 K/uL Final   Monocytes Absolute 03/14/2023 0.4  0.1 - 1.0 K/uL Final   Eosinophils Absolute 03/14/2023 0.1  0.0 - 0.7 K/uL Final   Basophils Absolute 03/14/2023 0.1  0.0 - 0.1 K/uL Final   Total Bilirubin 03/14/2023 0.6  0.2 - 1.2 mg/dL Final   Bilirubin, Direct 03/14/2023 0.1  0.0 - 0.3 mg/dL Final   Alkaline Phosphatase 03/14/2023 68  39 - 117 U/L Final   AST 03/14/2023 11  0 - 37 U/L Final   ALT 03/14/2023 9  0 - 53 U/L Final   Total Protein 03/14/2023 7.7  6.0 - 8.3 g/dL Final   Albumin 03/14/2023 4.4  3.5 - 5.2 g/dL Final   Cholesterol 03/14/2023 185  0 - 200 mg/dL Final   ATP III Classification       Desirable:  < 200 mg/dL               Borderline High:  200 - 239 mg/dL          High:  > = 240 mg/dL   Triglycerides 03/14/2023 113.0  0.0 - 149.0 mg/dL Final   Normal:  <150 mg/dLBorderline High:  150 - 199 mg/dL   HDL 03/14/2023 40.60  >39.00 mg/dL Final   VLDL 03/14/2023 22.6  0.0 - 40.0 mg/dL  Final   LDL Cholesterol 03/14/2023 121 (H)  0 - 99 mg/dL Final  Total CHOL/HDL Ratio 03/14/2023 5   Final                  Men          Women1/2 Average Risk     3.4          3.3Average Risk          5.0          4.42X Average Risk          9.6          7.13X Average Risk          15.0          11.0                       NonHDL 03/14/2023 143.93   Final   NOTE:  Non-HDL goal should be 30 mg/dL higher than patient's LDL goal (i.e. LDL goal of < 70 mg/dL, would have non-HDL goal of < 100 mg/dL)   Microalb, Ur 03/14/2023 13.5 (H)  0.0 - 1.9 mg/dL Final   Creatinine,U 03/14/2023 244.8  mg/dL Final   Microalb Creat Ratio 03/14/2023 5.5  0.0 - 30.0 mg/g Final  Lab on 03/12/2023  Component Date Value Ref Range Status   Hgb A1c MFr Bld 03/12/2023 8.6 (H)  4.6 - 6.5 % Final   Glycemic Control Guidelines for People with Diabetes:Non Diabetic:  <6%Goal of Therapy: <7%Additional Action Suggested:  >8%    Sodium 03/12/2023 142  135 - 145 mEq/L Final   Potassium 03/12/2023 4.5  3.5 - 5.1 mEq/L Final   Chloride 03/12/2023 103  96 - 112 mEq/L Final   CO2 03/12/2023 30  19 - 32 mEq/L Final   Glucose, Bld 03/12/2023 120 (H)  70 - 99 mg/dL Final   BUN 03/12/2023 17  6 - 23 mg/dL Final   Creatinine, Ser 03/12/2023 1.45  0.40 - 1.50 mg/dL Final   GFR 03/12/2023 54.21 (L)  >60.00 mL/min Final   Calculated using the CKD-EPI Creatinine Equation (2021)   Calcium 03/12/2023 10.2  8.4 - 10.5 mg/dL Final    Allergies as of 03/18/2023   No Known Allergies      Medication List        Accurate as of March 18, 2023  3:08 PM. If you have any questions, ask your nurse or doctor.          STOP taking these medications    Ozempic (1 MG/DOSE) 4 MG/3ML Sopn Generic drug: Semaglutide (1 MG/DOSE) Replaced by: Ozempic (2 MG/DOSE) 8 MG/3ML Sopn Stopped by: Elayne Snare, MD       TAKE these medications    atorvastatin 20 MG tablet Commonly known as: Lipitor Take 1 tablet (20 mg total) by mouth daily.    gabapentin 300 MG capsule Commonly known as: NEURONTIN Take 1 capsule (300 mg total) by mouth at bedtime as needed.   glipiZIDE 10 MG tablet Commonly known as: GLUCOTROL Take by mouth.   lisinopril-hydrochlorothiazide 20-25 MG tablet Commonly known as: ZESTORETIC Take 1 tablet by mouth daily.   losartan 50 MG tablet Commonly known as: COZAAR TAKE 1 TABLET BY MOUTH EVERY DAY   metFORMIN 500 MG 24 hr tablet Commonly known as: GLUCOPHAGE-XR Take 2 tablets (1,000 mg total) by mouth daily with supper.   multivitamin with minerals Tabs tablet Take 1 tablet by mouth daily.   OneTouch Verio Flex System w/Device Kit by Does not apply route.  OneTouch Verio test strip Generic drug: glucose blood USE AS INSTRUCTED TO CHECK BLOOD SUGAR 2 TIMES DAILY..   Ozempic (2 MG/DOSE) 8 MG/3ML Sopn Generic drug: Semaglutide (2 MG/DOSE) Inject 2 mg weekly Replaces: Ozempic (1 MG/DOSE) 4 MG/3ML Sopn Started by: Elayne Snare, MD   tamsulosin 0.4 MG Caps capsule Commonly known as: FLOMAX Take 1 capsule (0.4 mg total) by mouth daily.        Allergies: No Known Allergies  Past Medical History:  Diagnosis Date   Charcot foot due to diabetes mellitus ([place])    Constipation    sometimes soft, some hard -- does go daily - are on fiber    Diabetes mellitus    GERD (gastroesophageal reflux disease)    Headache    Hyperlipidemia    Hypertension    Neuromuscular disorder ([place])    neuropathy    Past Surgical History:  Procedure Laterality Date   LAPAROSCOPIC GASTRIC SLEEVE RESECTION N/A 09/05/2020   Procedure: LAPAROSCOPIC SLEEVE GASTRECTOMY;  Surgeon: Greer Pickerel, MD;  Location: [facility];  Service: General;  Laterality: N/A;   UPPER GASTROINTESTINAL ENDOSCOPY  2002   Dr Collene Mares    UPPER GI ENDOSCOPY N/A 09/05/2020   Procedure: UPPER GI ENDOSCOPY;  Surgeon: Greer Pickerel, MD;  Location: [facility];  Service: General;  Laterality: N/A;    Family History  Problem Relation Age of Onset   Diabetes  Mellitus II Mother    CAD Mother    Prostate cancer Father    Prostate cancer Maternal Uncle    Prostate cancer Maternal Uncle    Colon cancer Neg Hx    Colon polyps Neg Hx     Social History:  reports that he has never smoked. He has never used smokeless tobacco. He reports that he does not drink alcohol and does not use drugs.   Review of Systems  Lipid history: Had been treated by PCP with 20 mg atorvastatin, dose was recently increased In says that he is eating a lot of regular cheese although does not eat a lot of fatty meats  Last lipid panel:    Lab Results  Component Value Date   CHOL 185 03/14/2023   HDL 40.60 03/14/2023   LDLCALC 121 (H) 03/14/2023   LDLDIRECT 83.0 06/17/2022   TRIG 113.0 03/14/2023   CHOLHDL 5 03/14/2023           Hypertension: Taking 50 mg losartan with good control    BP Readings from Last 3 Encounters:  03/18/23 116/72  03/13/23 128/74  12/16/22 (!) 142/84    He has been given gabapentin at night from his PCP for neuropathic pain  Currently known complications of diabetes: Neuropathy, erectile dysfunction, unknown status of retinopathy  Has a renal dysfunction of unclear etiology with variable creatinine  Lab Results  Component Value Date   CREATININE 1.45 03/12/2023   CREATININE 1.46 12/02/2022   CREATININE 1.42 06/17/2022    he has had hypercalcemia previously  Lab Results  Component Value Date   PTH 35 03/08/2021   CALCIUM 10.2 03/12/2023   CAION 1.23 05/04/2012     LABS:  Office Visit on 03/13/2023  Component Date Value Ref Range Status   Vitamin B-12 03/14/2023 372  211 - 911 pg/mL Final   VITD 03/14/2023 58.22  30.00 - 100.00 ng/mL Final   PSA 03/14/2023 1.48  0.10 - 4.00 ng/mL Final   Test performed using Access Hybritech PSA Assay, a parmagnetic partical, chemiluminecent immunoassay.   Color, Urine 03/14/2023 YELLOW  Yellow;Lt. Yellow;Straw;Dark Yellow;Amber;Green;Red;Brown Final   APPearance 03/14/2023  CLEAR  Clear;Turbid;Slightly Cloudy;Cloudy Final   Specific Gravity, Urine 03/14/2023 >=1.030 (A)  1.000 - 1.030 Final   pH 03/14/2023 6.0  5.0 - 8.0 Final   Total Protein, Urine 03/14/2023 30 (A)  Negative Final   Urine Glucose 03/14/2023 NEGATIVE  Negative Final   Ketones, ur 03/14/2023 TRACE (A)  Negative Final   Bilirubin Urine 03/14/2023 NEGATIVE  Negative Final   Hgb urine dipstick 03/14/2023 NEGATIVE  Negative Final   Urobilinogen, UA 03/14/2023 0.2  0.0 - 1.0 Final   Leukocytes,Ua 03/14/2023 NEGATIVE  Negative Final   Nitrite 03/14/2023 NEGATIVE  Negative Final   WBC, UA 03/14/2023 0-2/hpf  0-2/hpf Final   RBC / HPF 03/14/2023 0-2/hpf  0-2/hpf Final   Mucus, UA 03/14/2023 Presence of (A)  None Final   Squamous Epithelial / HPF 03/14/2023 Rare(0-4/hpf)  Rare(0-4/hpf) Final   Bacteria, UA 03/14/2023 Few(10-50/hpf) (A)  None Final   TSH 03/14/2023 1.63  0.35 - 5.50 uIU/mL Final   WBC 03/14/2023 5.1  4.0 - 10.5 K/uL Final   RBC 03/14/2023 5.49  4.22 - 5.81 Mil/uL Final   Hemoglobin 03/14/2023 16.1  13.0 - 17.0 g/dL Final   HCT 03/14/2023 47.7  39.0 - 52.0 % Final   MCV 03/14/2023 87.0  78.0 - 100.0 fl Final   MCHC 03/14/2023 33.7  30.0 - 36.0 g/dL Final   RDW 03/14/2023 14.6  11.5 - 15.5 % Final   Platelets 03/14/2023 454.0 (H)  150.0 - 400.0 K/uL Final   Neutrophils Relative % 03/14/2023 56.9  43.0 - 77.0 % Final   Lymphocytes Relative 03/14/2023 31.0  12.0 - 46.0 % Final   Monocytes Relative 03/14/2023 7.7  3.0 - 12.0 % Final   Eosinophils Relative 03/14/2023 2.5  0.0 - 5.0 % Final   Basophils Relative 03/14/2023 1.9  0.0 - 3.0 % Final   Neutro Abs 03/14/2023 2.9  1.4 - 7.7 K/uL Final   Lymphs Abs 03/14/2023 1.6  0.7 - 4.0 K/uL Final   Monocytes Absolute 03/14/2023 0.4  0.1 - 1.0 K/uL Final   Eosinophils Absolute 03/14/2023 0.1  0.0 - 0.7 K/uL Final   Basophils Absolute 03/14/2023 0.1  0.0 - 0.1 K/uL Final   Total Bilirubin 03/14/2023 0.6  0.2 - 1.2 mg/dL Final   Bilirubin,  Direct 03/14/2023 0.1  0.0 - 0.3 mg/dL Final   Alkaline Phosphatase 03/14/2023 68  39 - 117 U/L Final   AST 03/14/2023 11  0 - 37 U/L Final   ALT 03/14/2023 9  0 - 53 U/L Final   Total Protein 03/14/2023 7.7  6.0 - 8.3 g/dL Final   Albumin 03/14/2023 4.4  3.5 - 5.2 g/dL Final   Cholesterol 03/14/2023 185  0 - 200 mg/dL Final   ATP III Classification       Desirable:  < 200 mg/dL               Borderline High:  200 - 239 mg/dL          High:  > = 240 mg/dL   Triglycerides 03/14/2023 113.0  0.0 - 149.0 mg/dL Final   Normal:  <150 mg/dLBorderline High:  150 - 199 mg/dL   HDL 03/14/2023 40.60  >39.00 mg/dL Final   VLDL 03/14/2023 22.6  0.0 - 40.0 mg/dL Final   LDL Cholesterol 03/14/2023 121 (H)  0 - 99 mg/dL Final   Total CHOL/HDL Ratio 03/14/2023 5   Final  Men          Women1/2 Average Risk     3.4          3.3Average Risk          5.0          4.42X Average Risk          9.6          7.13X Average Risk          15.0          11.0                       NonHDL 03/14/2023 143.93   Final   NOTE:  Non-HDL goal should be 30 mg/dL higher than patient's LDL goal (i.e. LDL goal of < 70 mg/dL, would have non-HDL goal of < 100 mg/dL)   Microalb, Ur 03/14/2023 13.5 (H)  0.0 - 1.9 mg/dL Final   Creatinine,U 03/14/2023 244.8  mg/dL Final   Microalb Creat Ratio 03/14/2023 5.5  0.0 - 30.0 mg/g Final  Lab on 03/12/2023  Component Date Value Ref Range Status   Hgb A1c MFr Bld 03/12/2023 8.6 (H)  4.6 - 6.5 % Final   Glycemic Control Guidelines for People with Diabetes:Non Diabetic:  <6%Goal of Therapy: <7%Additional Action Suggested:  >8%    Sodium 03/12/2023 142  135 - 145 mEq/L Final   Potassium 03/12/2023 4.5  3.5 - 5.1 mEq/L Final   Chloride 03/12/2023 103  96 - 112 mEq/L Final   CO2 03/12/2023 30  19 - 32 mEq/L Final   Glucose, Bld 03/12/2023 120 (H)  70 - 99 mg/dL Final   BUN 03/12/2023 17  6 - 23 mg/dL Final   Creatinine, Ser 03/12/2023 1.45  0.40 - 1.50 mg/dL Final   GFR 03/12/2023  54.21 (L)  >60.00 mL/min Final   Calculated using the CKD-EPI Creatinine Equation (2021)   Calcium 03/12/2023 10.2  8.4 - 10.5 mg/dL Final    Physical Examination:  BP 116/72 (BP Location: Left Arm, Patient Position: Sitting, Cuff Size: Normal)   Pulse 98   Ht 6\' 4"  (1.93 m)   Wt 247 lb (112 kg)   SpO2 93%   BMI 30.07 kg/m     ASSESSMENT:  Diabetes type 2  See history of present illness for discussion of current diabetes management, blood sugar patterns and problems identified  His A1c is still not adequate at 8.6  Currently only on 1 Ozempic with 1 g metformin  His blood sugars do not seem to be high fasting at home but likely are higher after meals Portions are small As before he has not had significant success with weight loss even with his gastric sleeve surgery previously  A1c appears to be higher than expected also  Lipids: His LDL is higher but he is likely needing to improve his diet with cutting back on saturated fat from dairy products Discussed causes of hypercholesterolemia  PLAN:    He will go up to 2 mg on the Ozempic  Needs to cut back on high fat foods and snacks like cheese To start checking blood sugars more consistently after meals and at night If not improved may consider switching to [facility] Encouraged him to start regular walking for exercise Will also check fructosamine on the next visit  Patient Instructions  Cut down regular cheese    Elayne Snare 03/18/2023, 3:08 PM   Note: This office note was prepared with Viviann Spare  voice recognition system technology. Any transcriptional errors that result from this process are unintentional.

## 2023-03-18 NOTE — Patient Instructions (Signed)
Cut down regular cheese

## 2023-03-20 ENCOUNTER — Other Ambulatory Visit: Payer: Self-pay | Admitting: Endocrinology

## 2023-04-03 ENCOUNTER — Encounter (HOSPITAL_COMMUNITY): Payer: Self-pay | Admitting: *Deleted

## 2023-04-28 ENCOUNTER — Other Ambulatory Visit: Payer: Self-pay | Admitting: Endocrinology

## 2023-05-22 ENCOUNTER — Other Ambulatory Visit: Payer: Self-pay | Admitting: Endocrinology

## 2023-05-27 ENCOUNTER — Ambulatory Visit: Payer: BC Managed Care – PPO | Admitting: Family Medicine

## 2023-06-12 ENCOUNTER — Ambulatory Visit: Payer: BC Managed Care – PPO | Admitting: Family Medicine

## 2023-06-12 NOTE — Progress Notes (Deleted)
   Rubin Payor, PhD, LAT, ATC acting as a scribe for Clementeen Graham, MD.  Jaysten Essner is a 57 y.o. male who presents to [facility] today for L shoulder pain x ***. MOI:?***. Pt locates pain to ***  Neck pain: Radiates: Aggravates: Treatments tried:  Dx imaging: 10/18/21 L shoulder XR  Pertinent review of systems: ***  Relevant historical information: ***   Exam:  There were no vitals taken for this visit. General: Well Developed, well nourished, and in no acute distress.   MSK: ***    Lab and Radiology Results No results found for this or any previous visit (from the past 72 hour(s)). No results found.     Assessment and Plan: 57 y.o. male with ***   PDMP not reviewed this encounter. No orders of the defined types were placed in this encounter.  No orders of the defined types were placed in this encounter.    Discussed warning signs or symptoms. Please see discharge instructions. Patient expresses understanding.   ***

## 2023-06-13 ENCOUNTER — Other Ambulatory Visit (INDEPENDENT_AMBULATORY_CARE_PROVIDER_SITE_OTHER): Payer: BC Managed Care – PPO

## 2023-06-13 DIAGNOSIS — E7800 Pure hypercholesterolemia, unspecified: Secondary | ICD-10-CM | POA: Diagnosis not present

## 2023-06-13 DIAGNOSIS — E1165 Type 2 diabetes mellitus with hyperglycemia: Secondary | ICD-10-CM | POA: Diagnosis not present

## 2023-06-13 LAB — BASIC METABOLIC PANEL
BUN: 11 mg/dL (ref 6–23)
CO2: 29 mEq/L (ref 19–32)
Calcium: 9.5 mg/dL (ref 8.4–10.5)
Chloride: 103 mEq/L (ref 96–112)
Creatinine, Ser: 1.32 mg/dL (ref 0.40–1.50)
GFR: 60.57 mL/min (ref >60.00)
Glucose, Bld: 117 mg/dL — ABNORMAL HIGH (ref 70–99)
Potassium: 4.1 mEq/L (ref 3.5–5.1)
Sodium: 141 mEq/L (ref 135–145)

## 2023-06-13 LAB — LDL CHOLESTEROL, DIRECT: Direct LDL: 61 mg/dL

## 2023-06-13 LAB — HEMOGLOBIN A1C: Hgb A1c MFr Bld: 7.1 % — ABNORMAL HIGH (ref 4.6–6.5)

## 2023-06-14 LAB — FRUCTOSAMINE: Fructosamine: 272 umol/L (ref 0–285)

## 2023-06-18 ENCOUNTER — Ambulatory Visit: Payer: BC Managed Care – PPO | Admitting: Endocrinology

## 2023-06-18 ENCOUNTER — Encounter: Payer: Self-pay | Admitting: Endocrinology

## 2023-06-18 VITALS — BP 136/80 | HR 100 | Ht 76.0 in | Wt 238.0 lb

## 2023-06-18 DIAGNOSIS — I10 Essential (primary) hypertension: Secondary | ICD-10-CM

## 2023-06-18 DIAGNOSIS — E7800 Pure hypercholesterolemia, unspecified: Secondary | ICD-10-CM

## 2023-06-18 DIAGNOSIS — E1165 Type 2 diabetes mellitus with hyperglycemia: Secondary | ICD-10-CM | POA: Diagnosis not present

## 2023-06-18 DIAGNOSIS — Z7985 Long-term (current) use of injectable non-insulin antidiabetic drugs: Secondary | ICD-10-CM

## 2023-06-18 NOTE — Progress Notes (Signed)
Patient ID: Raymond Ryan, male   DOB: [birth year], 57 y.o.   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 He thinks he had been able to keep his weight down with Victoza which he took only for short time in 2016 He has had no side effects with Victoza which was stopped because of his occupation also  Recent history:    Non-insulin hypoglycemic drugs: Metformin 500 mg, 2 daily, Ozempic 1 mg weekly  His A1c is at his best level of 7.1, was 8.6 Fructosamine also excellent at 272  Current management, blood sugar patterns and problems identified:  He has been able to continue Ozempic since his last visit and blood sugar control is improving He has also lost 11 pounds since his last visit Generally able to keep his portions smaller Although his fasting readings are relatively higher overall blood sugars are fairly well-controlled with not enough readings done after dinner however  Highest reading 218 after dinner He has still not started doing any specific exercise or walking program as yet  No side effects from meds Sherron Monday or [employer] usually at 7 pm 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  Side effects from medications have been: Diarrhea from regular metformin       Glucose monitoring:  done 1-2 times a day         Glucometer:   One Touch Verio    Blood Glucose readings    PRE-MEAL Fasting Lunch Dinner Bedtime Overall  Glucose range: 109-182    109-218  Mean/median: 134 117   144   POST-MEAL PC Breakfast PC  Lunch PC Dinner  Glucose range:  151, 218 123, 216  Mean/median:       Previous readings:   PRE-MEAL Fasting Lunch Dinner Bedtime Overall  Glucose range: 130-143  100-119  100-187  Mean/median: 135    136   POST-MEAL PC Breakfast PC Lunch PC Dinner  Glucose range: 128-187  ?  148  Mean/median: 148      Dietician visit, most recent: 9/21  Weight history:  Wt Readings from Last 3 Encounters:  06/18/23 238 lb (108 kg)  03/18/23 247 lb (112 kg)  03/13/23 249 lb (112.9 kg)    Glycemic control:   Lab Results  Component Value Date   HGBA1C 7.1 (H) 06/13/2023   HGBA1C 8.6 (H) 03/12/2023   HGBA1C 8.3 (H) 12/02/2022   Lab Results  Component Value Date   MICROALBUR 13.5 (H) 03/14/2023   LDLCALC 121 (H) 03/14/2023   CREATININE 1.32 06/13/2023   Lab Results  Component Value Date   MICRALBCREAT 5.5 03/14/2023    Lab Results  Component Value Date   FRUCTOSAMINE 272 06/13/2023   FRUCTOSAMINE 307 (H) 12/02/2022   FRUCTOSAMINE 380 (H) 06/17/2022    Lab on 06/13/2023  Component Date Value Ref Range Status   Direct LDL 06/13/2023 61.0  mg/dL Final   Optimal:  <784  mg/dLNear or Above Optimal:  100-129 mg/dLBorderline High:  130-159 mg/dLHigh:  160-189 mg/dLVery High:  >190 mg/dL   Hgb Z6X MFr Bld 09/60/4540 7.1 (H)  4.6 - 6.5 % Final   Glycemic Control Guidelines for People with Diabetes:Non Diabetic:  <6%Goal of Therapy: <7%Additional Action Suggested:  >8%    Sodium 06/13/2023 141  135 - 145 mEq/L Final   Potassium 06/13/2023 4.1  3.5 - 5.1 mEq/L Final   Chloride 06/13/2023 103  96 - 112 mEq/L Final   CO2 06/13/2023 29  19 - 32 mEq/L Final   Glucose, Bld 06/13/2023 117 (H)  70 - 99 mg/dL Final   BUN 98/10/9146 11  6 - 23 mg/dL Final   Creatinine, Ser 06/13/2023 1.32  0.40 - 1.50 mg/dL Final   GFR 82/95/6213 60.57  >60.00 mL/min Final   Calculated using the CKD-EPI Creatinine Equation (2021)   Calcium 06/13/2023 9.5  8.4 - 10.5 mg/dL Final   Fructosamine 08/65/7846  272  0 - 285 umol/L Final   Comment: Published reference interval for apparently healthy subjects between age 35 and 13 is 27 - 285 umol/L and in a poorly controlled diabetic population is 228 - 563 umol/L with a mean of 396 umol/L.     Allergies as of 06/18/2023   No Known Allergies      Medication List        Accurate as of June 18, 2023 10:17 AM. If you have any questions, ask your nurse or doctor.          atorvastatin 20 MG tablet Commonly known as: Lipitor Take 1 tablet (20 mg total) by mouth daily.   gabapentin 300 MG capsule Commonly known as: NEURONTIN Take 1 capsule (300 mg total) by mouth at bedtime as needed.   glipiZIDE 10 MG tablet Commonly known as: GLUCOTROL Take by mouth.   lisinopril-hydrochlorothiazide 20-25 MG tablet Commonly known as: ZESTORETIC Take 1 tablet by mouth daily.   losartan 50 MG tablet Commonly known as: COZAAR TAKE 1 TABLET BY MOUTH EVERY DAY   metFORMIN 500 MG 24 hr tablet Commonly known as: GLUCOPHAGE-XR TAKE 2 TABLETS (1,000 MG TOTAL) BY MOUTH DAILY WITH SUPPER   multivitamin with minerals Tabs tablet Take 1 tablet by mouth daily.   OneTouch Verio Flex System w/Device Kit by Does not apply route.   OneTouch Verio test strip Generic drug: glucose blood USE AS INSTRUCTED TO CHECK BLOOD SUGAR 2 TIMES DAILY..   Ozempic (2 MG/DOSE) 8 MG/3ML Sopn Generic drug: Semaglutide (2 MG/DOSE) Inject 2 mg weekly   tamsulosin 0.4 MG Caps capsule Commonly known as: FLOMAX Take 1 capsule (0.4 mg total) by mouth daily.        Allergies: No Known Allergies  Past Medical History:  Diagnosis Date   Charcot foot due to diabetes mellitus (HCC)    Constipation    sometimes soft, some hard -- does go daily - are on fiber    Diabetes mellitus    GERD (gastroesophageal reflux disease)    Headache    Hyperlipidemia    Hypertension    Neuromuscular disorder (HCC)    neuropathy    Past Surgical History:  Procedure Laterality  Date   LAPAROSCOPIC GASTRIC SLEEVE RESECTION N/A 09/05/2020   Procedure: LAPAROSCOPIC SLEEVE GASTRECTOMY;  Surgeon: Gaynelle Adu, MD;  Location: [facility];  Service: General;  Laterality: N/A;   UPPER GASTROINTESTINAL ENDOSCOPY  2002   Dr Loreta Ave    UPPER GI ENDOSCOPY N/A 09/05/2020   Procedure: UPPER  GI ENDOSCOPY;  Surgeon: Gaynelle Adu, MD;  Location: [facility];  Service: General;  Laterality: N/A;    Family History  Problem Relation Age of Onset   Diabetes Mellitus II Mother    CAD Mother    Prostate cancer Father    Prostate cancer Maternal Uncle    Prostate cancer Maternal Uncle    Colon cancer Neg Hx    Colon polyps Neg Hx     Social History:  reports that he has never smoked. He has never used smokeless tobacco. He reports that he does not drink alcohol and does not use drugs.   Review of Systems  Lipid history: Had been treated by PCP with 20 mg atorvastatin  LDL is now 61  Last lipid panel:    Lab Results  Component Value Date   CHOL 185 03/14/2023   HDL 40.60 03/14/2023   LDLCALC 121 (H) 03/14/2023   LDLDIRECT 61.0 06/13/2023   TRIG 113.0 03/14/2023   CHOLHDL 5 03/14/2023           Hypertension: Taking 50 mg losartan with good control    BP Readings from Last 3 Encounters:  06/18/23 136/80  03/18/23 116/72  03/13/23 128/74    He has been given gabapentin at night from his PCP for neuropathic pain  Currently known complications of diabetes: Neuropathy, erectile dysfunction, unknown status of retinopathy  Has had mild renal dysfunction of unclear etiology with variable creatinine  Urine microalbumin normal as of 02/2023  Lab Results  Component Value Date   CREATININE 1.32 06/13/2023   CREATININE 1.45 03/12/2023   CREATININE 1.46 12/02/2022     LABS:  Lab on 06/13/2023  Component Date Value Ref Range Status   Direct LDL 06/13/2023 61.0  mg/dL Final   Optimal:  <161 mg/dLNear or Above Optimal:  100-129 mg/dLBorderline High:  130-159 mg/dLHigh:  160-189  mg/dLVery High:  >190 mg/dL   Hgb W9U MFr Bld 04/54/0981 7.1 (H)  4.6 - 6.5 % Final   Glycemic Control Guidelines for People with Diabetes:Non Diabetic:  <6%Goal of Therapy: <7%Additional Action Suggested:  >8%    Sodium 06/13/2023 141  135 - 145 mEq/L Final   Potassium 06/13/2023 4.1  3.5 - 5.1 mEq/L Final   Chloride 06/13/2023 103  96 - 112 mEq/L Final   CO2 06/13/2023 29  19 - 32 mEq/L Final   Glucose, Bld 06/13/2023 117 (H)  70 - 99 mg/dL Final   BUN 19/14/7829 11  6 - 23 mg/dL Final   Creatinine, Ser 06/13/2023 1.32  0.40 - 1.50 mg/dL Final   GFR 56/21/3086 60.57  >60.00 mL/min Final   Calculated using the CKD-EPI Creatinine Equation (2021)   Calcium 06/13/2023 9.5  8.4 - 10.5 mg/dL Final   Fructosamine 57/84/6962 272  0 - 285 umol/L Final   Comment: Published reference interval for apparently healthy subjects between age 5 and 31 is 23 - 285 umol/L and in a poorly controlled diabetic population is 228 - 563 umol/L with a mean of 396 umol/L.     Physical Examination:  BP 136/80 (BP Location: Left Arm, Patient Position: Sitting, Cuff Size: Small)   Pulse 100   Ht 6\' 4"  (1.93 m)   Wt 238 lb (108 kg)   SpO2 99%   BMI 28.97 kg/m   No ankle edema present  ASSESSMENT:  Diabetes type 2  See history of present illness for discussion of current diabetes management, blood sugar patterns and problems identified  His A1c is mildly  improved and now down to 7.1 compared to 8.6  He is now taking 2 mg Ozempic with 1 g metformin  His blood sugars are somewhat variable but generally fairly well-controlled He does seem to be benefiting from consistently taking 2 mg Ozempic Also has reduced portions and high-fat meals Currently is starting to lose weight with BMI 29  Lipids: His LDL is down to 61, improved   PLAN:  Continue 2 mg dosage on the Ozempic  He will continue to watch his portions and cut back on snacks and make good choices Start walking for regular exercise as he  finds time with his routine Discussed blood sugar targets at different times and does need to do more readings after dinner  He will stay on his 20 mg Lipitor for hypercholesterolemia  No change in losartan for hypertension  Follow-up in 3 months  There are no Patient Instructions on file for this visit.    Reather Littler 06/18/2023, 10:17 AM   Note: This office note was prepared with Dragon voice recognition system technology. Any transcriptional errors that result from this process are unintentional.

## 2023-07-23 ENCOUNTER — Other Ambulatory Visit: Payer: Self-pay | Admitting: Endocrinology

## 2023-08-07 ENCOUNTER — Other Ambulatory Visit: Payer: Self-pay | Admitting: Endocrinology

## 2023-08-08 ENCOUNTER — Other Ambulatory Visit: Payer: Self-pay | Admitting: Endocrinology

## 2023-08-14 ENCOUNTER — Encounter (INDEPENDENT_AMBULATORY_CARE_PROVIDER_SITE_OTHER): Payer: Self-pay

## 2023-09-04 ENCOUNTER — Ambulatory Visit (HOSPITAL_COMMUNITY)
Admission: RE | Admit: 2023-09-04 | Discharge: 2023-09-04 | Disposition: A | Discharge status: Home / Self Care | Payer: No Typology Code available for payment source | Source: Ambulatory Visit | Attending: Internal Medicine | Admitting: Internal Medicine

## 2023-09-04 ENCOUNTER — Encounter (HOSPITAL_COMMUNITY): Payer: Self-pay

## 2023-09-04 ENCOUNTER — Other Ambulatory Visit: Payer: Self-pay

## 2023-09-04 VITALS — BP 107/71 | HR 98 | Temp 98.4°F | Resp 20

## 2023-09-04 DIAGNOSIS — S81802A Unspecified open wound, left lower leg, initial encounter: Secondary | ICD-10-CM

## 2023-09-04 DIAGNOSIS — Z23 Encounter for immunization: Secondary | ICD-10-CM

## 2023-09-04 DIAGNOSIS — L089 Local infection of the skin and subcutaneous tissue, unspecified: Secondary | ICD-10-CM | POA: Diagnosis not present

## 2023-09-04 MED ORDER — TETANUS-DIPHTH-ACELL PERTUSSIS 5-2.5-18.5 LF-MCG/0.5 IM SUSY
PREFILLED_SYRINGE | INTRAMUSCULAR | Status: AC
  Filled 2023-09-04: qty 0.5

## 2023-09-04 MED ORDER — DOXYCYCLINE HYCLATE 100 MG PO CAPS: 100.0000 mg | ORAL_CAPSULE | Freq: Two times a day (BID) | ORAL | 0 refills | Status: AC

## 2023-09-04 MED ORDER — TETANUS-DIPHTH-ACELL PERTUSSIS 5-2.5-18.5 LF-MCG/0.5 IM SUSY
0.5000 mL | PREFILLED_SYRINGE | Freq: Once | INTRAMUSCULAR | Status: AC
  Administered 2023-09-04: 0.5 mL via INTRAMUSCULAR
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
## 2023-09-04 NOTE — Discharge Instructions (Addendum)
Daily wound dressing changes with topical antibiotic ointment is recommended Please do not clean your wound with peroxide Please take antibiotics as directed Tylenol as needed for pain Please return to urgent care if you have worsening symptoms.
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
## 2023-09-15 ENCOUNTER — Ambulatory Visit: Payer: BC Managed Care – PPO | Admitting: Internal Medicine

## 2023-09-15 ENCOUNTER — Encounter: Payer: Self-pay | Admitting: Internal Medicine

## 2023-09-15 VITALS — BP 126/78 | HR 111 | Temp 99.2°F | Ht 76.0 in | Wt 237.0 lb

## 2023-09-15 DIAGNOSIS — Z7985 Long-term (current) use of injectable non-insulin antidiabetic drugs: Secondary | ICD-10-CM

## 2023-09-15 DIAGNOSIS — N1831 Chronic kidney disease, stage 3a: Secondary | ICD-10-CM | POA: Diagnosis not present

## 2023-09-15 DIAGNOSIS — E1122 Type 2 diabetes mellitus with diabetic chronic kidney disease: Secondary | ICD-10-CM | POA: Diagnosis not present

## 2023-09-15 DIAGNOSIS — I10 Essential (primary) hypertension: Secondary | ICD-10-CM

## 2023-09-15 DIAGNOSIS — N182 Chronic kidney disease, stage 2 (mild): Secondary | ICD-10-CM

## 2023-09-15 DIAGNOSIS — Z23 Encounter for immunization: Secondary | ICD-10-CM | POA: Diagnosis not present

## 2023-09-15 DIAGNOSIS — E7800 Pure hypercholesterolemia, unspecified: Secondary | ICD-10-CM

## 2023-09-15 DIAGNOSIS — L97519 Non-pressure chronic ulcer of other part of right foot with unspecified severity: Secondary | ICD-10-CM | POA: Insufficient documentation

## 2023-09-15 DIAGNOSIS — L97512 Non-pressure chronic ulcer of other part of right foot with fat layer exposed: Secondary | ICD-10-CM

## 2023-09-15 DIAGNOSIS — Z7984 Long term (current) use of oral hypoglycemic drugs: Secondary | ICD-10-CM

## 2023-09-15 LAB — HEPATIC FUNCTION PANEL
ALT: 10 U/L (ref 0–53)
AST: 13 U/L (ref 0–37)
Albumin: 4 g/dL (ref 3.5–5.2)
Alkaline Phosphatase: 65 U/L (ref 39–117)
Bilirubin, Direct: 0.1 mg/dL (ref 0.0–0.3)
Total Bilirubin: 0.5 mg/dL (ref 0.2–1.2)
Total Protein: 7.5 g/dL (ref 6.0–8.3)

## 2023-09-15 LAB — CBC WITH DIFFERENTIAL/PLATELET
Basophils Absolute: 0.1 K/uL (ref 0.0–0.1)
Basophils Relative: 1.3 % (ref 0.0–3.0)
Eosinophils Absolute: 0.1 K/uL (ref 0.0–0.7)
Eosinophils Relative: 1.8 % (ref 0.0–5.0)
HCT: 44.8 % (ref 39.0–52.0)
Hemoglobin: 14.8 g/dL (ref 13.0–17.0)
Lymphocytes Relative: 24.8 % (ref 12.0–46.0)
Lymphs Abs: 1.7 K/uL (ref 0.7–4.0)
MCHC: 33 g/dL (ref 30.0–36.0)
MCV: 88.6 fl (ref 78.0–100.0)
Monocytes Absolute: 0.5 K/uL (ref 0.1–1.0)
Monocytes Relative: 7.2 % (ref 3.0–12.0)
Neutro Abs: 4.3 K/uL (ref 1.4–7.7)
Neutrophils Relative %: 64.9 % (ref 43.0–77.0)
Platelets: 492 K/uL — ABNORMAL HIGH (ref 150.0–400.0)
RBC: 5.06 Mil/uL (ref 4.22–5.81)
RDW: 14.4 % (ref 11.5–15.5)
WBC: 6.7 K/uL (ref 4.0–10.5)

## 2023-09-15 LAB — LIPID PANEL
Cholesterol: 128 mg/dL (ref 0–200)
HDL: 44.3 mg/dL
LDL Cholesterol: 55 mg/dL (ref 0–99)
NonHDL: 83.79
Total CHOL/HDL Ratio: 3
Triglycerides: 144 mg/dL (ref 0.0–149.0)
VLDL: 28.8 mg/dL (ref 0.0–40.0)

## 2023-09-15 LAB — HEMOGLOBIN A1C: Hgb A1c MFr Bld: 6.9 % — ABNORMAL HIGH (ref 4.6–6.5)

## 2023-09-15 LAB — BASIC METABOLIC PANEL
BUN: 9 mg/dL (ref 6–23)
CO2: 29 meq/L (ref 19–32)
Calcium: 9.7 mg/dL (ref 8.4–10.5)
Chloride: 104 meq/L (ref 96–112)
Creatinine, Ser: 1.41 mg/dL (ref 0.40–1.50)
GFR: 55.86 mL/min — ABNORMAL LOW (ref >60.00)
Glucose, Bld: 118 mg/dL — ABNORMAL HIGH (ref 70–99)
Potassium: 4.2 meq/L (ref 3.5–5.1)
Sodium: 142 meq/L (ref 135–145)

## 2023-09-15 MED ORDER — OZEMPIC (2 MG/DOSE) 8 MG/3ML ~~LOC~~ SOPN: PEN_INJECTOR | SUBCUTANEOUS | 3 refills | Status: DC

## 2023-09-15 NOTE — Progress Notes (Signed)
The test results show that your current treatment is OK, as the tests are stable.  Please continue the same plan.  There is no other need for change of treatment or further evaluation based on these results, at this time.  thanks

## 2023-09-15 NOTE — Patient Instructions (Signed)
You had the flu shot today  Please continue all other medications as before, and refills have been done if requested.  Please have the pharmacy call with any other refills you may need.  Please continue your efforts at being more active, low cholesterol diet, and weight control.  Please keep your appointments with your specialists as you may have planned - Endo in Nov 2024  You will be contacted regarding the referral for: podiatry - asap  Please go to the LAB at the blood drawing area for the tests to be done  You will be contacted by phone if any changes need to be made immediately.  Otherwise, you will receive a letter about your results with an explanation, but please check with MyChart first.  Please make an Appointment to return in 6 months, or sooner if needed

## 2023-09-15 NOTE — Progress Notes (Unsigned)
Patient ID: Raymond Ryan, male   DOB: 11-15-1966, 57 y.o.   MRN: [id number]        Chief Complaint: follow up HTN, HLD and DM, left shin wound, right second toe new DFU       HPI:  Raymond Ryan is a 57 y.o. 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  Pt denies polydipsia, polyuria, or new focal neuro s/s.  Lost wt with ozempic, needs refill, has f/u appt with endo in oct 2024.  Due for flu shot  Wt Readings from Last 3 Encounters:  09/15/23 237 lb (107.5 kg)  06/18/23 238 lb (108 kg)  03/18/23 247 lb (112 kg)   BP Readings from Last 3 Encounters:  09/15/23 126/78  09/04/23 107/71  06/18/23 136/80         Past Medical History:  Diagnosis Date   Charcot foot due to diabetes mellitus (HCC)    Constipation    sometimes soft, some hard -- does go daily - are on fiber    Diabetes mellitus    GERD (gastroesophageal reflux disease)    Headache    Hyperlipidemia    Hypertension    Neuromuscular disorder (HCC)    neuropathy   Past Surgical History:  Procedure Laterality Date   LAPAROSCOPIC GASTRIC SLEEVE RESECTION N/A 09/05/2020   Procedure: LAPAROSCOPIC SLEEVE GASTRECTOMY;  Surgeon: Gaynelle Adu, MD;  Location: [facility];  Service: General;  Laterality: N/A;   UPPER GASTROINTESTINAL ENDOSCOPY  2002   Dr Loreta Ave    UPPER GI ENDOSCOPY N/A 09/05/2020   Procedure: UPPER GI ENDOSCOPY;  Surgeon: Gaynelle Adu, MD;  Location: [facility];  Service: General;  Laterality: N/A;    reports that he has never smoked. He has never used smokeless tobacco. He reports that he does not drink alcohol and does not use drugs. family history includes CAD in his mother; Diabetes Mellitus II in his mother; Prostate cancer in his father, maternal uncle, and  maternal uncle. No Known Allergies Current Outpatient Medications on File Prior to Visit  Medication Sig Dispense Refill   atorvastatin (LIPITOR) 20 MG tablet Take 1 tablet (20 mg total) by mouth daily. 90 tablet 3   Blood Glucose Monitoring Suppl (ONETOUCH VERIO FLEX SYSTEM) w/Device KIT by Does not apply route.     gabapentin (NEURONTIN) 300 MG capsule Take 1 capsule (300 mg total) by mouth at bedtime as needed. 90 capsule 1   glucose blood (ONETOUCH VERIO) test strip USE AS INSTRUCTED TO CHECK BLOOD SUGAR 2 TIMES DAILY.. 100 strip 3   losartan (COZAAR) 50 MG tablet TAKE 1 TABLET BY MOUTH EVERY DAY 90 tablet 0   metFORMIN (GLUCOPHAGE-XR) 500 MG 24 hr tablet TAKE 2 TABLETS (1,000 MG TOTAL) BY MOUTH DAILY WITH SUPPER 180 tablet 1   Multiple Vitamin (MULTIVITAMIN WITH MINERALS) TABS tablet Take 1 tablet by mouth daily.     tamsulosin (FLOMAX) 0.4 MG CAPS capsule Take 1 capsule (0.4 mg total) by mouth daily. 90 capsule 3   No current facility-administered medications on file prior to visit.        ROS:  All others reviewed and negative.  Objective        PE:  BP 126/78 (BP Location: Left Arm, Patient  Position: Sitting, Cuff Size: Normal)   Pulse (!) 111   Temp 99.2 F (37.3 C) (Oral)   Ht 6\' 4"  (1.93 m)   Wt 237 lb (107.5 kg)   SpO2 99%   BMI 28.85 kg/m                 Constitutional: Pt appears in NAD               HENT: Head: NCAT.                Right Ear: External ear normal.                 Left Ear: External ear normal.                Eyes: . Pupils are equal, round, and reactive to light. Conjunctivae and EOM are normal               Nose: without d/c or deformity               Neck: Neck supple. Gross normal ROM               Cardiovascular: Normal rate and regular rhythm.                 Pulmonary/Chest: Effort normal and breath sounds without rales or wheezing.                Abd:  Soft, NT, ND, + BS, no organomegaly               Neurological: Pt is alert. At baseline  orientation, motor grossly intact               Skin: Skin is warm. LE edema - none, right foot second toe distal pad with full thickness skin loss to subcutaneous tissue, no red, swelling, tender, drainage or other color change               Psychiatric: Pt behavior is normal without agitation   Micro: none  Cardiac tracings I have personally interpreted today:  none  Pertinent Radiological findings (summarize): none   Lab Results  Component Value Date   WBC 6.7 09/15/2023   HGB 14.8 09/15/2023   HCT 44.8 09/15/2023   PLT 492.0 (H) 09/15/2023   GLUCOSE 118 (H) 09/15/2023   CHOL 128 09/15/2023   TRIG 144.0 09/15/2023   HDL 44.30 09/15/2023   LDLDIRECT 61.0 06/13/2023   LDLCALC 55 09/15/2023   ALT 10 09/15/2023   AST 13 09/15/2023   NA 142 09/15/2023   K 4.2 09/15/2023   CL 104 09/15/2023   CREATININE 1.41 09/15/2023   BUN 9 09/15/2023   CO2 29 09/15/2023   TSH 1.63 03/14/2023   PSA 1.48 03/14/2023   HGBA1C 6.9 (H) 09/15/2023   MICROALBUR 13.5 (H) 03/14/2023   Assessment/Plan:  Raymond Ryan is a 57 y.o. Black or African American [2] male with  has a past medical history of Charcot foot due to diabetes mellitus (HCC), Constipation, Diabetes mellitus, GERD (gastroesophageal reflux disease), Headache, Hyperlipidemia, Hypertension, and Neuromuscular disorder (HCC).  CKD (chronic kidney disease) stage 3, GFR 30-59 ml/min Lab Results  Component Value Date   CREATININE 1.41 09/15/2023   Stable overall, cont to avoid nephrotoxins  HTN (hypertension) BP Readings from Last 3 Encounters:  09/15/23 126/78  09/04/23 107/71  06/18/23 136/80   Stable, pt to continue medical treatment losartan 50 qd   Hyperlipidemia Lab  Results  Component Value Date   LDLCALC 55 09/15/2023   Stable, pt to continue current statin lipitor 20 qd   Toe ulcer, right (HCC) Without s/s infection - for podiatry referral asap  Type 2 diabetes mellitus with stage 2 chronic kidney disease,  without long-term current use of insulin (HCC) Lab Results  Component Value Date   HGBA1C 6.9 (H) 09/15/2023   Stable, pt to continue current medical treatment metformin ER 500 - 2 every day, ozempic 2 mg weekly  Followup: Return in about 6 months (around 03/14/2024).  Raymond Barre, MD 09/18/2023 9:14 PM [facility]

## 2023-09-18 ENCOUNTER — Encounter: Payer: Self-pay | Admitting: Internal Medicine

## 2023-09-18 NOTE — Assessment & Plan Note (Signed)
Lab Results  Component Value Date   HGBA1C 6.9 (H) 09/15/2023   Stable, pt to continue current medical treatment metformin ER 500 - 2 every day, ozempic 2 mg weekly

## 2023-09-18 NOTE — Assessment & Plan Note (Signed)
Lab Results  Component Value Date   CREATININE 1.41 09/15/2023   Stable overall, cont to avoid nephrotoxins

## 2023-09-18 NOTE — Assessment & Plan Note (Signed)
Without s/s infection - for podiatry referral asap

## 2023-09-18 NOTE — Assessment & Plan Note (Signed)
Lab Results  Component Value Date   LDLCALC 55 09/15/2023   Stable, pt to continue current statin lipitor 20 qd

## 2023-09-18 NOTE — Assessment & Plan Note (Signed)
BP Readings from Last 3 Encounters:  09/15/23 126/78  09/04/23 107/71  06/18/23 136/80   Stable, pt to continue medical treatment losartan 50 qd

## 2023-09-24 ENCOUNTER — Ambulatory Visit: Payer: BC Managed Care – PPO | Admitting: Podiatry

## 2023-09-24 ENCOUNTER — Ambulatory Visit (INDEPENDENT_AMBULATORY_CARE_PROVIDER_SITE_OTHER): Payer: BC Managed Care – PPO

## 2023-09-24 DIAGNOSIS — M14671 Charcot's joint, right ankle and foot: Secondary | ICD-10-CM

## 2023-09-24 DIAGNOSIS — E1149 Type 2 diabetes mellitus with other diabetic neurological complication: Secondary | ICD-10-CM

## 2023-09-24 DIAGNOSIS — L97512 Non-pressure chronic ulcer of other part of right foot with fat layer exposed: Secondary | ICD-10-CM

## 2023-09-24 NOTE — Progress Notes (Signed)
Subjective:  Patient ID: Theda Belfast, male    DOB: 1966-11-20,   MRN: [id number]  No chief complaint on file.   57 y.o. male presents for concern of ulceration to right second digit that has been present for about two weeks. Relates he pulled off some skin and the ulcer appeared. He relates he was on some antibiotics a week back for a wound on his leg that is resolving. Relates not pain in the toe.Marland Kitchen Has been dressing with neosporin  Patient is diabetic and last A1c was  Lab Results  Component Value Date   HGBA1C 6.9 (H) 09/15/2023   .   PCP:  Corwin Levins, MD    . Denies any other pedal complaints. Denies n/v/f/c.   Past Medical History:  Diagnosis Date   Charcot foot due to diabetes mellitus (HCC)    Constipation    sometimes soft, some hard -- does go daily - are on fiber    Diabetes mellitus    GERD (gastroesophageal reflux disease)    Headache    Hyperlipidemia    Hypertension    Neuromuscular disorder (HCC)    neuropathy    Objective:  Physical Exam: Vascular: DP/PT pulses 2/4 bilateral. CFT <3 seconds. Normal hair growth on digits. No edema.  Skin. No lacerations or abrasions bilateral feet. Distal second digit wound with granular base. No erythema edema or purulence noted. Measures about 0.2 cm in circumference.  Musculoskeletal: MMT 5/5 bilateral lower extremities in DF, PF, Inversion and Eversion. Deceased ROM in DF of ankle joint.  Neurological: Sensation intact to light touch.   Assessment:   1. Skin ulcer of second toe of right foot with fat layer exposed (HCC)   2. Other diabetic neurological complication associated with type 2 diabetes mellitus (HCC)   3. Charcot's joint of right foot      Plan:  Patient was evaluated and treated and all questions answered. Ulcer right second digit with fat layer exposed  -X-rays with no obvious osseous erosions noted some concern for the distal tip and will keep an eye on this area. Clinically no concern for  deeper infection currently  -Debridement as below. -Dressed with betadine, DSD. -Off-loading with surgical shoe. -No abx indicated.  -Discussed glucose control and proper protein-rich diet.  -Discussed if any worsening redness, pain, fever or chills to call or may need to report to the emergency room. Patient expressed understanding.   Procedure: Excisional Debridement of Wound Rationale: Removal of non-viable soft tissue from the wound to promote healing.  Anesthesia: none Pre-Debridement Wound Measurements: Overlying callus  Post-Debridement Wound Measurements: 0.2 cm x 0.2 cm x 0.2 cm  Type of Debridement: Sharp Excisional Tissue Removed: Non-viable soft tissue Depth of Debridement: subcutaneous tissue. Technique: Sharp excisional debridement to bleeding, viable wound base.  Dressing: Dry, sterile, compression dressing. Disposition: Patient tolerated procedure well. Patient to return in 3 week for follow-up.  Return in about 3 weeks (around 10/15/2023) for wound check.   Louann Sjogren, DPM

## 2023-09-25 ENCOUNTER — Telehealth: Payer: Self-pay

## 2023-09-25 ENCOUNTER — Other Ambulatory Visit (HOSPITAL_COMMUNITY): Payer: Self-pay

## 2023-09-25 NOTE — Telephone Encounter (Signed)
Pharmacy Patient Advocate Encounter   Received notification from CoverMyMeds that prior authorization for Ozempic is required/requested.    Per test claim: PA required; PA submitted to [facility] via CoverMyMeds Key/confirmation #/EOC [id number] Status is pending

## 2023-09-29 NOTE — Telephone Encounter (Signed)
Pharmacy Patient Advocate Encounter  Received notification from [facility] that Prior Authorization for Ozempic has been APPROVED through 09/24/2026

## 2023-10-01 ENCOUNTER — Other Ambulatory Visit (HOSPITAL_COMMUNITY): Payer: Self-pay

## 2023-10-14 ENCOUNTER — Ambulatory Visit: Payer: BC Managed Care – PPO | Admitting: Podiatry

## 2023-10-20 ENCOUNTER — Ambulatory Visit: Payer: BC Managed Care – PPO | Admitting: Podiatry

## 2023-10-29 ENCOUNTER — Other Ambulatory Visit: Payer: Self-pay

## 2023-10-29 DIAGNOSIS — E1165 Type 2 diabetes mellitus with hyperglycemia: Secondary | ICD-10-CM

## 2023-11-03 ENCOUNTER — Ambulatory Visit (INDEPENDENT_AMBULATORY_CARE_PROVIDER_SITE_OTHER): Payer: BC Managed Care – PPO | Admitting: Podiatry

## 2023-11-03 DIAGNOSIS — Z91199 Patient's noncompliance with other medical treatment and regimen due to unspecified reason: Secondary | ICD-10-CM

## 2023-11-03 NOTE — Progress Notes (Signed)
No show

## 2023-11-05 ENCOUNTER — Other Ambulatory Visit (INDEPENDENT_AMBULATORY_CARE_PROVIDER_SITE_OTHER): Payer: BC Managed Care – PPO

## 2023-11-05 DIAGNOSIS — E1165 Type 2 diabetes mellitus with hyperglycemia: Secondary | ICD-10-CM

## 2023-11-05 LAB — BASIC METABOLIC PANEL
BUN: 12 mg/dL (ref 6–23)
CO2: 28 meq/L (ref 19–32)
Calcium: 9.7 mg/dL (ref 8.4–10.5)
Chloride: 105 meq/L (ref 96–112)
Creatinine, Ser: 1.35 mg/dL (ref 0.40–1.50)
GFR: 58.8 mL/min — ABNORMAL LOW (ref >60.00)
Glucose, Bld: 116 mg/dL — ABNORMAL HIGH (ref 70–99)
Potassium: 3.9 meq/L (ref 3.5–5.1)
Sodium: 142 meq/L (ref 135–145)

## 2023-11-05 LAB — HEMOGLOBIN A1C: Hgb A1c MFr Bld: 7 % — ABNORMAL HIGH (ref 4.6–6.5)

## 2023-11-12 ENCOUNTER — Encounter: Payer: Self-pay | Admitting: Endocrinology

## 2023-11-12 ENCOUNTER — Ambulatory Visit: Payer: BC Managed Care – PPO | Admitting: Endocrinology

## 2023-11-12 DIAGNOSIS — Z7985 Long-term (current) use of injectable non-insulin antidiabetic drugs: Secondary | ICD-10-CM | POA: Diagnosis not present

## 2023-11-12 DIAGNOSIS — Z7984 Long term (current) use of oral hypoglycemic drugs: Secondary | ICD-10-CM | POA: Diagnosis not present

## 2023-11-12 DIAGNOSIS — E118 Type 2 diabetes mellitus with unspecified complications: Secondary | ICD-10-CM | POA: Diagnosis not present

## 2023-11-12 MED ORDER — ONETOUCH VERIO VI STRP
ORAL_STRIP | 3 refills | Status: AC
Start: 2023-11-12 — End: ?

## 2023-11-12 MED ORDER — METFORMIN HCL ER 500 MG PO TB24: 1000.0000 mg | ORAL_TABLET | Freq: Every day | ORAL | 3 refills | Status: DC

## 2023-11-12 MED ORDER — LOSARTAN POTASSIUM 50 MG PO TABS: 50.0000 mg | ORAL_TABLET | Freq: Every day | ORAL | 3 refills | Status: DC

## 2023-11-12 NOTE — Progress Notes (Signed)
Outpatient Endocrinology Note [place] Gaile Allmon, MD  11/12/23  Patient's Name: Raymond Ryan    DOB: February 09, 1966    MRN: [id number]                                                    REASON OF VISIT: Follow up of type 2 diabetes mellitus  PCP: Corwin Levins, MD  HISTORY OF PRESENT ILLNESS:   Raymond Ryan is a 57 y.o. old male with past medical history listed below, is here for follow up for type 2 diabetes mellitus.   Pertinent Diabetes History: _Diagnosed as Diabetes Mellitus type 2  in 2002.  Chronic Diabetes Complications : Retinopathy: no. Last ophthalmology exam was done on annually, following with ophthalmology regularly.  Nephropathy: CKD IIIa, on ACE/ARB / losartan Peripheral neuropathy: yes, on gabapentin as needed.  Coronary artery disease: no Stroke: no  Relevant comorbidities and cardiovascular risk factors: Obesity: yes Body mass index is 28.95 kg/m.  Hypertension: Yes  Hyperlipidemia : Yes, on statin   Current / Home Diabetic regimen includes: Metformin XR 500 mg 2 times a daily Ozempic 2 mg weekly   Prior diabetic medications: Glipizide, Invokana, Victoza in the past.  He was planned for insulin in 2016 however subsequently stopped.  He had diarrhea with regular metformin.  Glycemic data:   He has One Touch Verio flex glucometer, download reviewed from October 30 to November 12, 2023 average blood sugar 142.  He has been checking occasionally.  In the morning fasting.  Some of the blood sugar are 169, 126, 132.   Hypoglycemia: Patient has no hypoglycemic episodes. Patient has hypoglycemia awareness.  Factors modifying glucose control: 1.  Diabetic diet assessment: 3 meals a day.  2.  Staying active or exercising: No formal exercise.  3.  Medication compliance: compliant all of the time.  Interval history  He has some stomach upset however has not been bothering him.  He is taking Ozempic 2 mg weekly.  He is concerned about not able to lose  weight.  He has a steady body weight.  It seems like weight loss may have plateaued.  Recent hemoglobin A1c 7%.  No other complaints today.  REVIEW OF SYSTEMS As per history of present illness.   PAST MEDICAL HISTORY: Past Medical History:  Diagnosis Date   Charcot foot due to diabetes mellitus (HCC)    Constipation    sometimes soft, some hard -- does go daily - are on fiber    Diabetes mellitus    GERD (gastroesophageal reflux disease)    Headache    Hyperlipidemia    Hypertension    Neuromuscular disorder (HCC)    neuropathy    PAST SURGICAL HISTORY: Past Surgical History:  Procedure Laterality Date   LAPAROSCOPIC GASTRIC SLEEVE RESECTION N/A 09/05/2020   Procedure: LAPAROSCOPIC SLEEVE GASTRECTOMY;  Surgeon: Gaynelle Adu, MD;  Location: [facility];  Service: General;  Laterality: N/A;   UPPER GASTROINTESTINAL ENDOSCOPY  2002   Dr Loreta Ave    UPPER GI ENDOSCOPY N/A 09/05/2020   Procedure: UPPER GI ENDOSCOPY;  Surgeon: Gaynelle Adu, MD;  Location: [facility];  Service: General;  Laterality: N/A;    ALLERGIES: No Known Allergies  FAMILY HISTORY:  Family History  Problem Relation Age of Onset   Diabetes Mellitus II Mother    CAD Mother  Prostate cancer Father    Prostate cancer Maternal Uncle    Prostate cancer Maternal Uncle    Colon cancer Neg Hx    Colon polyps Neg Hx     SOCIAL HISTORY: Social History   Socioeconomic History   Marital status: Single    Spouse name: Not on file   Number of children: 1   Years of education: 12   Highest education level: Not on file  Occupational History   Not on file  Tobacco Use   Smoking status: Never   Smokeless tobacco: Never  Vaping Use   Vaping status: Never Used  Substance and Sexual Activity   Alcohol use: No   Drug use: No   Sexual activity: Not on file  Other Topics Concern   Not on file  Social History Narrative   Not on file   Social Determinants of Health   Financial Resource Strain: Not on file  Food  Insecurity: Not on file  Transportation Needs: Not on file  Physical Activity: Not on file  Stress: Not on file  Social Connections: Not on file    MEDICATIONS:  Current Outpatient Medications  Medication Sig Dispense Refill   atorvastatin (LIPITOR) 20 MG tablet Take 1 tablet (20 mg total) by mouth daily. 90 tablet 3   Blood Glucose Monitoring Suppl (ONETOUCH VERIO FLEX SYSTEM) w/Device KIT by Does not apply route.     gabapentin (NEURONTIN) 300 MG capsule Take 1 capsule (300 mg total) by mouth at bedtime as needed. 90 capsule 1   Multiple Vitamin (MULTIVITAMIN WITH MINERALS) TABS tablet Take 1 tablet by mouth daily.     Semaglutide, 2 MG/DOSE, (OZEMPIC, 2 MG/DOSE,) 8 MG/3ML SOPN Inject 2 mg weekly 3 mL 3   tamsulosin (FLOMAX) 0.4 MG CAPS capsule Take 1 capsule (0.4 mg total) by mouth daily. 90 capsule 3   glucose blood (ONETOUCH VERIO) test strip USE AS INSTRUCTED TO CHECK BLOOD SUGAR 2 TIMES DAILY.. 100 strip 3   losartan (COZAAR) 50 MG tablet Take 1 tablet (50 mg total) by mouth daily. 90 tablet 3   metFORMIN (GLUCOPHAGE-XR) 500 MG 24 hr tablet Take 2 tablets (1,000 mg total) by mouth daily with supper. 180 tablet 3   No current facility-administered medications for this visit.    PHYSICAL EXAM: Vitals:   11/12/23 0954  BP: 136/80  Pulse: (!) 106  Resp: 20  SpO2: 98%  Weight: 237 lb 12.8 oz (107.9 kg)  Height: 6\' 4"  (1.93 m)   Body mass index is 28.95 kg/m.  Wt Readings from Last 3 Encounters:  11/12/23 237 lb 12.8 oz (107.9 kg)  09/15/23 237 lb (107.5 kg)  06/18/23 238 lb (108 kg)    General: Well developed, well nourished male in no apparent distress.  HEENT: AT/[place], no external lesions.  Eyes: Conjunctiva clear and no icterus. Neck: Neck supple  Lungs: Respirations not labored Neurologic: Alert, oriented, normal speech Extremities / Skin: Dry.  Psychiatric: Does not appear depressed or anxious  Diabetic Foot Exam - Simple   No data filed     LABS  Reviewed Lab Results  Component Value Date   HGBA1C 7.0 (H) 11/05/2023   HGBA1C 6.9 (H) 09/15/2023   HGBA1C 7.1 (H) 06/13/2023   Lab Results  Component Value Date   FRUCTOSAMINE 272 06/13/2023   FRUCTOSAMINE 307 (H) 12/02/2022   FRUCTOSAMINE 380 (H) 06/17/2022   Lab Results  Component Value Date   CHOL 128 09/15/2023   HDL 44.30 09/15/2023  LDLCALC 55 09/15/2023   LDLDIRECT 61.0 06/13/2023   TRIG 144.0 09/15/2023   CHOLHDL 3 09/15/2023   Lab Results  Component Value Date   MICRALBCREAT 5.5 03/14/2023   MICRALBCREAT 3.4 03/15/2022   Lab Results  Component Value Date   CREATININE 1.35 11/05/2023   Lab Results  Component Value Date   GFR 58.80 (L) 11/05/2023    ASSESSMENT / PLAN  1. Controlled type 2 diabetes mellitus with complication, without long-term current use of insulin (HCC)     Diabetes Mellitus type 2, complicated by diabetic neuropathy/CKD. - Diabetic status / severity: Controlled.  Lab Results  Component Value Date   HGBA1C 7.0 (H) 11/05/2023    - Hemoglobin A1c goal : <7%  - Medications: No change.  I) continue metformin extended release 500 mg 2 times a day. II) continue Ozempic 2 mg weekly.  To have better weight loss we will consider Mounjaro.  Patient reports his current insurance does not cover Mounjaro however from January it may cover.  Patient is asked to call our clinic once he has new insurance and that will cover [facility], will send a prescription at that time.  - Home glucose testing: In the morning fasting daily. - Discussed/ Gave Hypoglycemia treatment plan.  # Consult : not required at this time.   # Annual urine for microalbuminuria/ creatinine ratio, no microalbuminuria currently, continue ACE/ARB /losartan. Last  Lab Results  Component Value Date   MICRALBCREAT 5.5 03/14/2023    # Foot check nightly / neuropathy, continue gabapentin as needed, from PCP.  # Annual dilated diabetic eye exams.   - Diet: Make healthy  diabetic food choices - Life style / activity / exercise: Discussed.  2. Blood pressure  -  BP Readings from Last 1 Encounters:  11/12/23 136/80    - Control is in target.  - No change in current plans.  3. Lipid status / Hyperlipidemia - Last  Lab Results  Component Value Date   LDLCALC 55 09/15/2023   - Continue atorvastatin 20 mg daily.  Managed by PCP.  Diagnoses and all orders for this visit:  Controlled type 2 diabetes mellitus with complication, without long-term current use of insulin (HCC) -     glucose blood (ONETOUCH VERIO) test strip; USE AS INSTRUCTED TO CHECK BLOOD SUGAR 2 TIMES DAILY.. -     Microalbumin / creatinine urine ratio; Future -     Hemoglobin A1c; Future -     Basic metabolic panel; Future  Other orders -     metFORMIN (GLUCOPHAGE-XR) 500 MG 24 hr tablet; Take 2 tablets (1,000 mg total) by mouth daily with supper. -     losartan (COZAAR) 50 MG tablet; Take 1 tablet (50 mg total) by mouth daily.   Labs prior to follow-up visit.  DISPOSITION Follow up in clinic in 4  months suggested.   All questions answered and patient verbalized understanding of the plan.  [place] Ikea Demicco, MD [facility] Endocrinology [facility] [address] [place], [address] [place], [place] [address] Phone # [phone]  At least part of this note was generated using voice recognition software. Inadvertent word errors may have occurred, which were not recognized during the proofreading process.

## 2024-01-21 ENCOUNTER — Encounter: Payer: Self-pay | Admitting: Endocrinology

## 2024-01-21 DIAGNOSIS — E1165 Type 2 diabetes mellitus with hyperglycemia: Secondary | ICD-10-CM

## 2024-01-21 MED ORDER — TIRZEPATIDE 10 MG/0.5ML ~~LOC~~ SOAJ: 10.0000 mg | SUBCUTANEOUS | 4 refills | Status: DC

## 2024-01-21 NOTE — Telephone Encounter (Signed)
I have [facility] prescription for Moun[facility].  I had sent prescription for losartan at the time of visit in November to the same pharmacy.  Please check with pharmacy if not available let us know.

## 2024-02-26 ENCOUNTER — Other Ambulatory Visit: Payer: Self-pay

## 2024-02-26 DIAGNOSIS — E118 Type 2 diabetes mellitus with unspecified complications: Secondary | ICD-10-CM

## 2024-03-04 ENCOUNTER — Other Ambulatory Visit: Payer: BC Managed Care – PPO

## 2024-03-05 ENCOUNTER — Encounter: Payer: Self-pay | Admitting: Endocrinology

## 2024-03-05 LAB — MICROALBUMIN / CREATININE URINE RATIO
Creatinine, Urine: 274 mg/dL (ref 20–320)
Microalb Creat Ratio: 31 mg/g{creat} — ABNORMAL HIGH (ref <30)
Microalb, Ur: 8.5 mg/dL

## 2024-03-05 LAB — BASIC METABOLIC PANEL
BUN/Creatinine Ratio: 9 (calc) (ref 6–22)
BUN: 12 mg/dL (ref 7–25)
CO2: 29 mmol/L (ref 20–32)
Calcium: 10.1 mg/dL (ref 8.6–10.3)
Chloride: 106 mmol/L (ref 98–110)
Creat: 1.37 mg/dL — ABNORMAL HIGH (ref 0.70–1.30)
Glucose, Bld: 98 mg/dL (ref 65–99)
Potassium: 4.4 mmol/L (ref 3.5–5.3)
Sodium: 143 mmol/L (ref 135–146)

## 2024-03-05 LAB — HEMOGLOBIN A1C
Hgb A1c MFr Bld: 7.5 %{Hb} — ABNORMAL HIGH (ref <5.7)
Mean Plasma Glucose: 169 mg/dL
eAG (mmol/L): 9.3 mmol/L

## 2024-03-15 ENCOUNTER — Other Ambulatory Visit: Payer: Self-pay | Admitting: Endocrinology

## 2024-03-15 ENCOUNTER — Ambulatory Visit: Payer: BC Managed Care – PPO | Admitting: Endocrinology

## 2024-03-15 ENCOUNTER — Encounter: Payer: Self-pay | Admitting: Endocrinology

## 2024-03-15 VITALS — BP 120/80 | HR 106 | Ht 76.0 in | Wt 249.0 lb

## 2024-03-15 DIAGNOSIS — Z7985 Long-term (current) use of injectable non-insulin antidiabetic drugs: Secondary | ICD-10-CM | POA: Diagnosis not present

## 2024-03-15 DIAGNOSIS — E1165 Type 2 diabetes mellitus with hyperglycemia: Secondary | ICD-10-CM

## 2024-03-15 MED ORDER — TIRZEPATIDE 15 MG/0.5ML ~~LOC~~ SOAJ
15.0000 mg | SUBCUTANEOUS | 4 refills | Status: DC
Start: 2024-03-15 — End: 2024-03-17

## 2024-03-15 NOTE — Progress Notes (Signed)
 Outpatient Endocrinology Note Raymond Keylin Ferryman, MD  03/15/24  Patient's Name: Raymond Ryan    DOB: 1966/07/03    MRN: [id number]                                                    REASON OF VISIT: Follow up of type 2 diabetes mellitus  PCP: Corwin Levins, MD  HISTORY OF PRESENT ILLNESS:   [name] is a 58 y.o. old male with past medical history listed below, is here for follow up for type 2 diabetes mellitus.   Pertinent Diabetes History: _Diagnosed as Diabetes Mellitus type 2  in 2002.  Chronic Diabetes Complications : Retinopathy: no. Last ophthalmology exam was done on annually, following with ophthalmology regularly.  Nephropathy: CKD IIIa, on ACE/ARB / losartan Peripheral neuropathy: yes, on gabapentin as needed.  Coronary artery disease: no Stroke: no  Relevant comorbidities and cardiovascular risk factors: Obesity: yes Body mass index is 30.31 kg/m.  Hypertension: Yes  Hyperlipidemia : Yes, on statin   Current / Home Diabetic regimen includes: Metformin XR 500 mg 1 time a daily Mounjaro 10 mg weekly   Prior diabetic medications: Glipizide, Invokana, Victoza in the past.  He was planned for insulin in 2016 however subsequently stopped.  He had diarrhea with regular metformin. Ozempic changed to [facility].  He was taking Ozempic 2 mg weekly before.  Glycemic data:   He has One Touch Verio flex glucometer, download reviewed from February 16 of March 15, 2024 average blood sugar 142.  He has been checking occasionally, mostly in the morning fasting.  Some of the blood sugar are 130, 121, 190, 102, 144, 128, 182, in the afternoon 104, 165, 190.  Hypoglycemia: Patient has no hypoglycemic episodes. Patient has hypoglycemia awareness.  Factors modifying glucose control: 1.  Diabetic diet assessment: 3 meals a day.  2.  Staying active or exercising: No formal exercise.  3.  Medication compliance: compliant all of the time.  Interval history  Patient has  been on [facility] since January, he reports appetite is well suppressed.  No GI issues.  He still has stomach issues and taking metformin 1 tablet daily only.  Hemoglobin A1c went up to 7.5%.  Renal function is stable.  No other complaints today.  REVIEW OF SYSTEMS As per history of present illness.   PAST MEDICAL HISTORY: Past Medical History:  Diagnosis Date   Charcot foot due to diabetes mellitus (HCC)    Constipation    sometimes soft, some hard -- does go daily - are on fiber    Diabetes mellitus    GERD (gastroesophageal reflux disease)    Headache    Hyperlipidemia    Hypertension    Neuromuscular disorder (HCC)    neuropathy    PAST SURGICAL HISTORY: Past Surgical History:  Procedure Laterality Date   LAPAROSCOPIC GASTRIC SLEEVE RESECTION N/A 09/05/2020   Procedure: LAPAROSCOPIC SLEEVE GASTRECTOMY;  Surgeon: Gaynelle Adu, MD;  Location: [facility];  Service: General;  Laterality: N/A;   UPPER GASTROINTESTINAL ENDOSCOPY  2002   Dr Loreta Ave    UPPER GI ENDOSCOPY N/A 09/05/2020   Procedure: UPPER GI ENDOSCOPY;  Surgeon: Gaynelle Adu, MD;  Location: [facility];  Service: General;  Laterality: N/A;    ALLERGIES: No Known Allergies  FAMILY HISTORY:  Family History  Problem Relation Age of  Onset   Diabetes Mellitus II Mother    CAD Mother    Prostate cancer Father    Prostate cancer Maternal Uncle    Prostate cancer Maternal Uncle    Colon cancer Neg Hx    Colon polyps Neg Hx     SOCIAL HISTORY: Social History   Socioeconomic History   Marital status: Single    Spouse name: Not on file   Number of children: 1   Years of education: 12   Highest education level: Not on file  Occupational History   Not on file  Tobacco Use   Smoking status: Never   Smokeless tobacco: Never  Vaping Use   Vaping status: Never Used  Substance and Sexual Activity   Alcohol use: No   Drug use: No   Sexual activity: Not on file  Other Topics Concern   Not on file  Social History Narrative    Not on file   Social Drivers of Health   Financial Resource Strain: Not on file  Food Insecurity: Not on file  Transportation Needs: Not on file  Physical Activity: Not on file  Stress: Not on file  Social Connections: Not on file    MEDICATIONS:  Current Outpatient Medications  Medication Sig Dispense Refill   Blood Glucose Monitoring Suppl (ONETOUCH VERIO FLEX SYSTEM) w/Device KIT by Does not apply route.     gabapentin (NEURONTIN) 300 MG capsule Take 1 capsule (300 mg total) by mouth at bedtime as needed. 90 capsule 1   glucose blood (ONETOUCH VERIO) test strip USE AS INSTRUCTED TO CHECK BLOOD SUGAR 2 TIMES DAILY.. 100 strip 3   losartan (COZAAR) 50 MG tablet Take 1 tablet (50 mg total) by mouth daily. 90 tablet 3   metFORMIN (GLUCOPHAGE-XR) 500 MG 24 hr tablet Take 2 tablets (1,000 mg total) by mouth daily with supper. 180 tablet 3   Multiple Vitamin (MULTIVITAMIN WITH MINERALS) TABS tablet Take 1 tablet by mouth daily.     tamsulosin (FLOMAX) 0.4 MG CAPS capsule Take 1 capsule (0.4 mg total) by mouth daily. 90 capsule 3   tirzepatide (MOUNJARO) 15 MG/0.5ML Pen Inject 15 mg into the skin once a week. 6 mL 4   atorvastatin (LIPITOR) 20 MG tablet Take 1 tablet (20 mg total) by mouth daily. 90 tablet 3   Semaglutide, 2 MG/DOSE, (OZEMPIC, 2 MG/DOSE,) 8 MG/3ML SOPN Inject 2 mg weekly (Patient not taking: Reported on 03/15/2024) 3 mL 3   No current facility-administered medications for this visit.    PHYSICAL EXAM: Vitals:   03/15/24 1018  BP: 120/80  Pulse: (!) 106  SpO2: 99%  Weight: 249 lb (112.9 kg)  Height: 6\' 4"  (1.93 m)   Body mass index is 30.31 kg/m.  Wt Readings from Last 3 Encounters:  03/15/24 249 lb (112.9 kg)  11/12/23 237 lb 12.8 oz (107.9 kg)  09/15/23 237 lb (107.5 kg)    General: Well developed, well nourished male in no apparent distress.  HEENT: AT/[place], no external lesions.  Eyes: Conjunctiva clear and no icterus. Neck: Neck supple  Lungs:  Respirations not labored Neurologic: Alert, oriented, normal speech Extremities / Skin: Dry.  Psychiatric: Does not appear depressed or anxious  Diabetic Foot Exam - Simple   Simple Foot Form Diabetic Foot exam was performed with the following findings: Yes 03/15/2024 10:44 AM  Visual Inspection See comments: Yes Sensation Testing See comments: Yes Pulse Check See comments: Yes Comments DP palpable bilaterally.   Monofilament exam absent bilaterally. Callus +,  no ulcer. Right charcot foot, flat sole.       LABS Reviewed Lab Results  Component Value Date   HGBA1C 7.5 (H) 03/04/2024   HGBA1C 7.0 (H) 11/05/2023   HGBA1C 6.9 (H) 09/15/2023   Lab Results  Component Value Date   FRUCTOSAMINE 272 06/13/2023   FRUCTOSAMINE 307 (H) 12/02/2022   FRUCTOSAMINE 380 (H) 06/17/2022   Lab Results  Component Value Date   CHOL 128 09/15/2023   HDL 44.30 09/15/2023   LDLCALC 55 09/15/2023   LDLDIRECT 61.0 06/13/2023   TRIG 144.0 09/15/2023   CHOLHDL 3 09/15/2023   Lab Results  Component Value Date   MICRALBCREAT 31 (H) 03/04/2024   MICRALBCREAT 5.5 03/14/2023   Lab Results  Component Value Date   CREATININE 1.37 (H) 03/04/2024   Lab Results  Component Value Date   GFR 58.80 (L) 11/05/2023    ASSESSMENT / PLAN  1. Uncontrolled type 2 diabetes mellitus with hyperglycemia, without long-term current use of insulin (HCC)    Diabetes Mellitus type 2, complicated by diabetic neuropathy/CKD. - Diabetic status / severity: Controlled.  Lab Results  Component Value Date   HGBA1C 7.5 (H) 03/04/2024    - Hemoglobin A1c goal : <6.5%  - Medications: See below.  I) continue metformin extended release 500 mg 1 time a day. II) increase Mounjaro from 10 mg to 15 mg weekly.  - Home glucose testing: In the morning fasting daily. - Discussed/ Gave Hypoglycemia treatment plan.  # Consult : not required at this time.   # Annual urine for microalbuminuria/ creatinine ratio, no  microalbuminuria currently, continue ACE/ARB /losartan. Last  Lab Results  Component Value Date   MICRALBCREAT 31 (H) 03/04/2024    # Foot check nightly / neuropathy, continue gabapentin as needed, from PCP.  # Annual dilated diabetic eye exams.   - Diet: Make healthy diabetic food choices - Life style / activity / exercise: Discussed.  2. Blood pressure  -  BP Readings from Last 1 Encounters:  03/15/24 120/80    - Control is in target.  - No change in current plans.  3. Lipid status / Hyperlipidemia - Last  Lab Results  Component Value Date   LDLCALC 55 09/15/2023   - Continue atorvastatin 20 mg daily.  Managed by PCP.  Rodriguez was seen today for follow-up.  Diagnoses and all orders for this visit:  Uncontrolled type 2 diabetes mellitus with hyperglycemia, without long-term current use of insulin (HCC) -     tirzepatide (MOUNJARO) 15 MG/0.5ML Pen; Inject 15 mg into the skin once a week.    DISPOSITION Follow up in clinic in 3 months suggested.  Labs on same day of the visit.   All questions answered and patient verbalized understanding of the plan.  Raymond Othell Diluzio, MD [facility] Endocrinology [facility] [address] [place], [address] [place], [place] [address] Phone # [phone]  At least part of this note was generated using voice recognition software. Inadvertent word errors may have occurred, which were not recognized during the proofreading process.

## 2024-03-17 ENCOUNTER — Encounter: Payer: Self-pay | Admitting: Endocrinology

## 2024-03-17 DIAGNOSIS — E1165 Type 2 diabetes mellitus with hyperglycemia: Secondary | ICD-10-CM

## 2024-03-17 MED ORDER — TIRZEPATIDE 12.5 MG/0.5ML ~~LOC~~ SOAJ: 12.5000 mg | SUBCUTANEOUS | 11 refills | Status: DC

## 2024-03-17 NOTE — Telephone Encounter (Signed)
 I sent prescription for 12.5 mg weekly.  After the month if you do not have any stomach related symptoms we can increase to 15 mg weekly, please let us know.

## 2024-04-02 ENCOUNTER — Encounter (HOSPITAL_COMMUNITY): Payer: Self-pay | Admitting: *Deleted

## 2024-04-12 ENCOUNTER — Other Ambulatory Visit: Payer: Self-pay

## 2024-06-16 ENCOUNTER — Encounter: Payer: Self-pay | Admitting: Endocrinology

## 2024-06-16 ENCOUNTER — Ambulatory Visit: Payer: Self-pay | Admitting: Endocrinology

## 2024-06-16 ENCOUNTER — Ambulatory Visit: Admitting: Endocrinology

## 2024-06-16 VITALS — BP 146/90 | HR 98 | Resp 20 | Ht 76.0 in | Wt 250.2 lb

## 2024-06-16 DIAGNOSIS — Z7984 Long term (current) use of oral hypoglycemic drugs: Secondary | ICD-10-CM

## 2024-06-16 DIAGNOSIS — E1165 Type 2 diabetes mellitus with hyperglycemia: Secondary | ICD-10-CM | POA: Diagnosis not present

## 2024-06-16 DIAGNOSIS — Z7985 Long-term (current) use of injectable non-insulin antidiabetic drugs: Secondary | ICD-10-CM

## 2024-06-16 LAB — POCT GLYCOSYLATED HEMOGLOBIN (HGB A1C): Hemoglobin A1C: 7.5 % — AB (ref 4.0–5.6)

## 2024-06-16 MED ORDER — ACCU-CHEK SOFTCLIX LANCETS MISC
12 refills | Status: AC
Start: 2024-06-16 — End: ?

## 2024-06-16 MED ORDER — TIRZEPATIDE 15 MG/0.5ML ~~LOC~~ SOAJ
15.0000 mg | SUBCUTANEOUS | 4 refills | Status: AC
Start: 2024-06-16 — End: ?

## 2024-06-16 MED ORDER — ACCU-CHEK GUIDE TEST VI STRP
ORAL_STRIP | 12 refills | Status: AC
Start: 2024-06-16 — End: ?

## 2024-06-16 MED ORDER — ACCU-CHEK GUIDE W/DEVICE KIT
PACK | 0 refills | Status: DC
Start: 2024-06-16 — End: 2024-10-02

## 2024-06-16 MED ORDER — METFORMIN HCL ER 500 MG PO TB24: 500.0000 mg | ORAL_TABLET | Freq: Every day | ORAL | 3 refills | Status: AC

## 2024-06-16 NOTE — Progress Notes (Signed)
 Outpatient Endocrinology Note Raymond Thessaly Mccullers, MD  06/16/24  Patient's Name: Raymond Ryan    DOB: 1966-03-16    MRN: [id number]                                                    REASON OF VISIT: Follow up of type 2 diabetes mellitus  PCP: Raymond Coombe, MD  HISTORY OF PRESENT ILLNESS:   Raymond Ryan is a 58 y.o. old male with past medical history listed below, is here for follow up for type 2 diabetes mellitus.   Pertinent Diabetes History: _Diagnosed as Diabetes Mellitus type 2  in 2002.  Chronic Diabetes Complications : Retinopathy: no. Last ophthalmology exam was done on annually, following with ophthalmology regularly.  Nephropathy: CKD IIIa, on ACE/ARB / losartan  Peripheral neuropathy: yes, on gabapentin  as needed.  Coronary artery disease: no Stroke: no  Relevant comorbidities and cardiovascular risk factors: Obesity: yes Body mass index is 30.46 kg/m.  Hypertension: Yes  Hyperlipidemia : Yes, on statin   Current / Home Diabetic regimen includes: Metformin  XR 500 mg 1 time a daily Mounjaro  12.5 mg weekly   Prior diabetic medications: Glipizide , Invokana , Victoza  in the past.  He was planned for insulin  in 2016 however subsequently stopped.  He had diarrhea with regular metformin  and higher dose of the extended release metformin . Ozempic  changed to Mounjaro .  He was taking Ozempic  2 mg weekly before.  Glycemic data:   He has One Touch Verio flex glucometer, download reviewed from June 4 to June 18 , 2025 average blood sugar 141. He has been checking occasionally, mostly in the morning fasting.  Some of the blood sugar are 134, 136, 139, 138, 156.   Hypoglycemia: Patient has no hypoglycemic episodes. Patient has hypoglycemia awareness.  Factors modifying glucose control: 1.  Diabetic diet assessment: 3 meals a day.  2.  Staying active or exercising: No formal exercise.  3.  Medication compliance: compliant all of the time.  Interval history  Patient  reports he was not able to get higher dose of Mounjaro  12.5 mg after the last visit however finally he got it about a month ago.  He has been tolerating this dose well, denies any GI issues.  At this week he was not able to refill Mounjaro  due to backorder.  Hemoglobin A1c stayed the same 7.5%.  Glucometer data as reviewed above.  Acceptable fasting blood sugar.  No numbness and tingling of the feet.  No vision problem.  No other complaints today.  REVIEW OF SYSTEMS As per history of present illness.   PAST MEDICAL HISTORY: Past Medical History:  Diagnosis Date   Charcot foot due to diabetes mellitus (HCC)    Constipation    sometimes soft, some hard -- does go daily - are on fiber    Diabetes mellitus    GERD (gastroesophageal reflux disease)    Headache    Hyperlipidemia    Hypertension    Neuromuscular disorder (HCC)    neuropathy    PAST SURGICAL HISTORY: Past Surgical History:  Procedure Laterality Date   LAPAROSCOPIC GASTRIC SLEEVE RESECTION N/A 09/05/2020   Procedure: LAPAROSCOPIC SLEEVE GASTRECTOMY;  Surgeon: Raymond Hummingbird, MD;  Location: [facility];  Service: General;  Laterality: N/A;   UPPER GASTROINTESTINAL ENDOSCOPY  2002   Dr Raymond Ryan    UPPER GI  ENDOSCOPY N/A 09/05/2020   Procedure: UPPER GI ENDOSCOPY;  Surgeon: Raymond Hummingbird, MD;  Location: [facility];  Service: General;  Laterality: N/A;    ALLERGIES: No Known Allergies  FAMILY HISTORY:  Family History  Problem Relation Age of Onset   Diabetes Mellitus II Mother    CAD Mother    Prostate cancer Father    Prostate cancer Maternal Uncle    Prostate cancer Maternal Uncle    Colon cancer Neg Hx    Colon polyps Neg Hx     SOCIAL HISTORY: Social History   Socioeconomic History   Marital status: Single    Spouse name: Not on file   Number of children: 1   Years of education: 12   Highest education level: Not on file  Occupational History   Not on file  Tobacco Use   Smoking status: Never   Smokeless tobacco: Never   Vaping Use   Vaping status: Never Used  Substance and Sexual Activity   Alcohol use: No   Drug use: No   Sexual activity: Not on file  Other Topics Concern   Not on file  Social History Narrative   Not on file   Social Drivers of Health   Financial Resource Strain: Not on file  Food Insecurity: Not on file  Transportation Needs: Not on file  Physical Activity: Not on file  Stress: Not on file  Social Connections: Not on file    MEDICATIONS:  Current Outpatient Medications  Medication Sig Dispense Refill   Accu-Chek Softclix Lancets lancets Use as instructed 100 each 12   atorvastatin  (LIPITOR) 20 MG tablet Take 1 tablet (20 mg total) by mouth daily. 90 tablet 3   Blood Glucose Monitoring Suppl (ACCU-CHEK GUIDE) w/Device KIT Use as advised 1 kit 0   Blood Glucose Monitoring Suppl (ONETOUCH VERIO FLEX SYSTEM) w/Device KIT by Does not apply route.     gabapentin  (NEURONTIN ) 300 MG capsule Take 1 capsule (300 mg total) by mouth at bedtime as needed. 90 capsule 1   glucose blood (ACCU-CHEK GUIDE TEST) test strip Use to test blood glucose before meals and at bedtime 300 each 12   losartan  (COZAAR ) 50 MG tablet Take 1 tablet (50 mg total) by mouth daily. 90 tablet 3   Multiple Vitamin (MULTIVITAMIN WITH MINERALS) TABS tablet Take 1 tablet by mouth daily.     tamsulosin  (FLOMAX ) 0.4 MG CAPS capsule Take 1 capsule (0.4 mg total) by mouth daily. 90 capsule 3   tirzepatide  (MOUNJARO ) 15 MG/0.5ML Pen Inject 15 mg into the skin once a week. 6 mL 4   metFORMIN  (GLUCOPHAGE -XR) 500 MG 24 hr tablet Take 1 tablet (500 mg total) by mouth daily with supper. 90 tablet 3   No current facility-administered medications for this visit.    PHYSICAL EXAM: Vitals:   06/16/24 1031  BP: (!) 146/90  Pulse: 98  Resp: 20  SpO2: 99%  Weight: 250 lb 3.2 oz (113.5 kg)  Height: 6' 4 (1.93 m)    Body mass index is 30.46 kg/m.  Wt Readings from Last 3 Encounters:  06/16/24 250 lb 3.2 oz (113.5 kg)   03/15/24 249 lb (112.9 kg)  11/12/23 237 lb 12.8 oz (107.9 kg)    General: Well developed, well nourished male in no apparent distress.  HEENT: AT/[place], no external lesions.  Eyes: Conjunctiva clear and no icterus. Neck: Neck supple  Lungs: Respirations not labored Neurologic: Alert, oriented, normal speech Extremities / Skin: Dry.  Psychiatric: Does not  appear depressed or anxious  Diabetic Foot Exam - Simple   No data filed     LABS Reviewed Lab Results  Component Value Date   HGBA1C 7.5 (A) 06/16/2024   HGBA1C 7.5 (H) 03/04/2024   HGBA1C 7.0 (H) 11/05/2023   Lab Results  Component Value Date   FRUCTOSAMINE 272 06/13/2023   FRUCTOSAMINE 307 (H) 12/02/2022   FRUCTOSAMINE 380 (H) 06/17/2022   Lab Results  Component Value Date   CHOL 128 09/15/2023   HDL 44.30 09/15/2023   LDLCALC 55 09/15/2023   LDLDIRECT 61.0 06/13/2023   TRIG 144.0 09/15/2023   CHOLHDL 3 09/15/2023   Lab Results  Component Value Date   MICRALBCREAT 31 (H) 03/04/2024   MICRALBCREAT 5.5 03/14/2023   Lab Results  Component Value Date   CREATININE 1.37 (H) 03/04/2024   Lab Results  Component Value Date   GFR 58.80 (L) 11/05/2023    ASSESSMENT / PLAN  1. Uncontrolled type 2 diabetes mellitus with hyperglycemia, without long-term current use of insulin  (HCC)     Diabetes Mellitus type 2, complicated by diabetic neuropathy/CKD. - Diabetic status / severity: Controlled.  Lab Results  Component Value Date   HGBA1C 7.5 (A) 06/16/2024    - Hemoglobin A1c goal : <6.5%  Patient reports he was not able to get Mounjaro  12.5 mg dose on time.  Currently taking it for last month, tolerating well.  - Medications: See below.  I) continue metformin  extended release 500 mg 1 time a day. II) increase Mounjaro  from 12.5 mg to 15 mg weekly.  - Home glucose testing: In the morning fasting daily. - Discussed/ Gave Hypoglycemia treatment plan.  # Consult : not required at this time.   # Annual  urine for microalbuminuria/ creatinine ratio, + microalbuminuria currently, continue ACE/ARB /losartan . Last  Lab Results  Component Value Date   MICRALBCREAT 31 (H) 03/04/2024    # Foot check nightly / neuropathy, continue gabapentin  as needed, from PCP.  # Annual dilated diabetic eye exams.   - Diet: Make healthy diabetic food choices - Life style / activity / exercise: Discussed.  2. Blood pressure  -  BP Readings from Last 1 Encounters:  06/16/24 (!) 146/90    - Control is not in target. Advised to monitor blood pressure at home.  He reports he is having cold symptoms and may be increasing blood pressure. - No change in current plans.  3. Lipid status / Hyperlipidemia - Last  Lab Results  Component Value Date   LDLCALC 55 09/15/2023   - Continue atorvastatin  20 mg daily.  Managed by PCP.  Diagnoses and all orders for this visit:  Uncontrolled type 2 diabetes mellitus with hyperglycemia, without long-term current use of insulin  (HCC) -     POCT glycosylated hemoglobin (Hb A1C) -     Blood Glucose Monitoring Suppl (ACCU-CHEK GUIDE) w/Device KIT; Use as advised -     glucose blood (ACCU-CHEK GUIDE TEST) test strip; Use to test blood glucose before meals and at bedtime -     Accu-Chek Softclix Lancets lancets; Use as instructed -     tirzepatide  (MOUNJARO ) 15 MG/0.5ML Pen; Inject 15 mg into the skin once a week. -     metFORMIN  (GLUCOPHAGE -XR) 500 MG 24 hr tablet; Take 1 tablet (500 mg total) by mouth daily with supper.    DISPOSITION Follow up in clinic in 3 months suggested.  Labs on same day of the visit.   All questions answered and patient verbalized  understanding of the plan.  Raymond Crisol Muecke, MD [facility] Endocrinology [facility] [address] [place], [address] [place], [place] [address] Phone # [phone]  At least part of this note was generated using voice recognition software. Inadvertent word errors may have occurred, which were not recognized  during the proofreading process.

## 2024-08-02 ENCOUNTER — Other Ambulatory Visit: Payer: Self-pay | Admitting: Internal Medicine

## 2024-08-02 DIAGNOSIS — Z760 Encounter for issue of repeat prescription: Secondary | ICD-10-CM

## 2024-10-01 ENCOUNTER — Ambulatory Visit: Admitting: Endocrinology

## 2024-10-02 ENCOUNTER — Other Ambulatory Visit: Payer: Self-pay

## 2024-10-02 ENCOUNTER — Encounter (HOSPITAL_COMMUNITY): Payer: Self-pay

## 2024-10-02 ENCOUNTER — Emergency Department (HOSPITAL_COMMUNITY)

## 2024-10-02 ENCOUNTER — Ambulatory Visit (HOSPITAL_COMMUNITY)
Admission: EM | Admit: 2024-10-02 | Discharge: 2024-10-02 | Disposition: A | Discharge status: Home / Self Care | Attending: Family Medicine | Admitting: Family Medicine

## 2024-10-02 ENCOUNTER — Inpatient Hospital Stay (HOSPITAL_COMMUNITY)
Admission: EM | Admit: 2024-10-02 | Discharge: 2024-10-06 | DRG: 286 | Disposition: A | Discharge status: Home / Self Care | Attending: Internal Medicine | Admitting: Internal Medicine

## 2024-10-02 DIAGNOSIS — Z7984 Long term (current) use of oral hypoglycemic drugs: Secondary | ICD-10-CM | POA: Diagnosis not present

## 2024-10-02 DIAGNOSIS — R0602 Shortness of breath: Secondary | ICD-10-CM

## 2024-10-02 DIAGNOSIS — Z79899 Other long term (current) drug therapy: Secondary | ICD-10-CM

## 2024-10-02 DIAGNOSIS — I10 Essential (primary) hypertension: Secondary | ICD-10-CM | POA: Diagnosis not present

## 2024-10-02 DIAGNOSIS — I428 Other cardiomyopathies: Secondary | ICD-10-CM | POA: Diagnosis present

## 2024-10-02 DIAGNOSIS — Z7985 Long-term (current) use of injectable non-insulin antidiabetic drugs: Secondary | ICD-10-CM

## 2024-10-02 DIAGNOSIS — E785 Hyperlipidemia, unspecified: Secondary | ICD-10-CM | POA: Diagnosis present

## 2024-10-02 DIAGNOSIS — E11610 Type 2 diabetes mellitus with diabetic neuropathic arthropathy: Secondary | ICD-10-CM | POA: Diagnosis present

## 2024-10-02 DIAGNOSIS — I5021 Acute systolic (congestive) heart failure: Secondary | ICD-10-CM

## 2024-10-02 DIAGNOSIS — Z1152 Encounter for screening for COVID-19: Secondary | ICD-10-CM

## 2024-10-02 DIAGNOSIS — I2510 Atherosclerotic heart disease of native coronary artery without angina pectoris: Secondary | ICD-10-CM | POA: Diagnosis present

## 2024-10-02 DIAGNOSIS — J90 Pleural effusion, not elsewhere classified: Secondary | ICD-10-CM

## 2024-10-02 DIAGNOSIS — N1831 Chronic kidney disease, stage 3a: Secondary | ICD-10-CM | POA: Diagnosis present

## 2024-10-02 DIAGNOSIS — I130 Hypertensive heart and chronic kidney disease with heart failure and stage 1 through stage 4 chronic kidney disease, or unspecified chronic kidney disease: Principal | ICD-10-CM | POA: Diagnosis present

## 2024-10-02 DIAGNOSIS — R000 Tachycardia, unspecified: Secondary | ICD-10-CM

## 2024-10-02 DIAGNOSIS — Z9884 Bariatric surgery status: Secondary | ICD-10-CM

## 2024-10-02 DIAGNOSIS — I2489 Other forms of acute ischemic heart disease: Secondary | ICD-10-CM | POA: Diagnosis present

## 2024-10-02 DIAGNOSIS — R9431 Abnormal electrocardiogram [ECG] [EKG]: Secondary | ICD-10-CM | POA: Diagnosis not present

## 2024-10-02 DIAGNOSIS — I509 Heart failure, unspecified: Secondary | ICD-10-CM | POA: Diagnosis not present

## 2024-10-02 DIAGNOSIS — Z833 Family history of diabetes mellitus: Secondary | ICD-10-CM | POA: Diagnosis not present

## 2024-10-02 DIAGNOSIS — E1122 Type 2 diabetes mellitus with diabetic chronic kidney disease: Secondary | ICD-10-CM | POA: Diagnosis present

## 2024-10-02 DIAGNOSIS — I493 Ventricular premature depolarization: Secondary | ICD-10-CM | POA: Diagnosis not present

## 2024-10-02 DIAGNOSIS — N179 Acute kidney failure, unspecified: Secondary | ICD-10-CM | POA: Diagnosis present

## 2024-10-02 DIAGNOSIS — I5041 Acute combined systolic (congestive) and diastolic (congestive) heart failure: Secondary | ICD-10-CM | POA: Diagnosis present

## 2024-10-02 DIAGNOSIS — N400 Enlarged prostate without lower urinary tract symptoms: Secondary | ICD-10-CM | POA: Diagnosis present

## 2024-10-02 DIAGNOSIS — E669 Obesity, unspecified: Secondary | ICD-10-CM | POA: Diagnosis present

## 2024-10-02 DIAGNOSIS — Z8249 Family history of ischemic heart disease and other diseases of the circulatory system: Secondary | ICD-10-CM | POA: Diagnosis not present

## 2024-10-02 DIAGNOSIS — I272 Pulmonary hypertension, unspecified: Secondary | ICD-10-CM | POA: Diagnosis present

## 2024-10-02 DIAGNOSIS — I3139 Other pericardial effusion (noninflammatory): Secondary | ICD-10-CM | POA: Diagnosis present

## 2024-10-02 DIAGNOSIS — I340 Nonrheumatic mitral (valve) insufficiency: Secondary | ICD-10-CM | POA: Diagnosis present

## 2024-10-02 DIAGNOSIS — I270 Primary pulmonary hypertension: Secondary | ICD-10-CM | POA: Diagnosis not present

## 2024-10-02 DIAGNOSIS — Z8042 Family history of malignant neoplasm of prostate: Secondary | ICD-10-CM

## 2024-10-02 DIAGNOSIS — E1140 Type 2 diabetes mellitus with diabetic neuropathy, unspecified: Secondary | ICD-10-CM | POA: Diagnosis present

## 2024-10-02 DIAGNOSIS — R7989 Other specified abnormal findings of blood chemistry: Secondary | ICD-10-CM

## 2024-10-02 DIAGNOSIS — Z6830 Body mass index (BMI) 30.0-30.9, adult: Secondary | ICD-10-CM

## 2024-10-02 LAB — CBC
HCT: 41.6 % (ref 39.0–52.0)
HCT: 42.4 % (ref 39.0–52.0)
Hemoglobin: 13.3 g/dL (ref 13.0–17.0)
Hemoglobin: 13.5 g/dL (ref 13.0–17.0)
MCH: 27.3 pg (ref 26.0–34.0)
MCH: 27.1 pg (ref 26.0–34.0)
MCHC: 32 g/dL (ref 30.0–36.0)
MCHC: 31.8 g/dL (ref 30.0–36.0)
MCV: 85.2 fL (ref 80.0–100.0)
MCV: 85.1 fL (ref 80.0–100.0)
Platelets: 476 K/uL — ABNORMAL HIGH (ref 150–400)
Platelets: 513 K/uL — ABNORMAL HIGH (ref 150–400)
RBC: 4.88 MIL/uL (ref 4.22–5.81)
RBC: 4.98 MIL/uL (ref 4.22–5.81)
RDW: 13.8 % (ref 11.5–15.5)
RDW: 14 % (ref 11.5–15.5)
WBC: 5.1 K/uL (ref 4.0–10.5)
WBC: 6.2 K/uL (ref 4.0–10.5)
nRBC: 0 % (ref 0.0–0.2)
nRBC: 0 % (ref 0.0–0.2)

## 2024-10-02 LAB — TROPONIN I (HIGH SENSITIVITY)
Troponin I (High Sensitivity): 223 ng/L (ref <18)
Troponin I (High Sensitivity): 223 ng/L (ref <18)
Troponin I (High Sensitivity): 217 ng/L (ref <18)

## 2024-10-02 LAB — BRAIN NATRIURETIC PEPTIDE: B Natriuretic Peptide: 764.8 pg/mL — ABNORMAL HIGH (ref 0.0–100.0)

## 2024-10-02 LAB — BASIC METABOLIC PANEL WITH GFR
Anion gap: 10 (ref 5–15)
BUN: 10 mg/dL (ref 6–20)
CO2: 22 mmol/L (ref 22–32)
Calcium: 8.8 mg/dL — ABNORMAL LOW (ref 8.9–10.3)
Chloride: 110 mmol/L (ref 98–111)
Creatinine, Ser: 1.51 mg/dL — ABNORMAL HIGH (ref 0.61–1.24)
GFR, Estimated: 54 mL/min — ABNORMAL LOW (ref >60)
Glucose, Bld: 145 mg/dL — ABNORMAL HIGH (ref 70–99)
Potassium: 3.6 mmol/L (ref 3.5–5.1)
Sodium: 142 mmol/L (ref 135–145)

## 2024-10-02 LAB — I-STAT CHEM 8, ED
BUN: 12 mg/dL (ref 6–20)
Calcium, Ion: 1.08 mmol/L — ABNORMAL LOW (ref 1.15–1.40)
Chloride: 111 mmol/L (ref 98–111)
Creatinine, Ser: 1.6 mg/dL — ABNORMAL HIGH (ref 0.61–1.24)
Glucose, Bld: 144 mg/dL — ABNORMAL HIGH (ref 70–99)
HCT: 41 % (ref 39.0–52.0)
Hemoglobin: 13.9 g/dL (ref 13.0–17.0)
Potassium: 3.5 mmol/L (ref 3.5–5.1)
Sodium: 144 mmol/L (ref 135–145)
TCO2: 21 mmol/L — ABNORMAL LOW (ref 22–32)

## 2024-10-02 LAB — RESP PANEL BY RT-PCR (RSV, FLU A&B, COVID)  RVPGX2
Influenza A by PCR: NEGATIVE
Influenza B by PCR: NEGATIVE
Resp Syncytial Virus by PCR: NEGATIVE
SARS Coronavirus 2 by RT PCR: NEGATIVE

## 2024-10-02 LAB — GLUCOSE, CAPILLARY: Glucose-Capillary: 180 mg/dL — ABNORMAL HIGH (ref 70–99)

## 2024-10-02 LAB — HEMOGLOBIN A1C
Hgb A1c MFr Bld: 6.1 % — ABNORMAL HIGH (ref 4.8–5.6)
Mean Plasma Glucose: 128.37 mg/dL

## 2024-10-02 LAB — CREATININE, SERUM
Creatinine, Ser: 1.57 mg/dL — ABNORMAL HIGH (ref 0.61–1.24)
GFR, Estimated: 51 mL/min — ABNORMAL LOW (ref >60)

## 2024-10-02 LAB — TSH: TSH: 3.564 u[IU]/mL (ref 0.350–4.500)

## 2024-10-02 LAB — HIV ANTIBODY (ROUTINE TESTING W REFLEX): HIV Screen 4th Generation wRfx: NONREACTIVE

## 2024-10-02 MED ORDER — FUROSEMIDE 10 MG/ML IJ SOLN
40.0000 mg | Freq: Two times a day (BID) | INTRAMUSCULAR | Status: DC
  Administered 2024-10-03 – 2024-10-04 (×3): 40 mg via INTRAVENOUS
  Filled 2024-10-02 (×4): qty 4

## 2024-10-02 MED ORDER — ATORVASTATIN CALCIUM 10 MG PO TABS: 20.0000 mg | ORAL_TABLET | Freq: Every day | ORAL | Status: DC

## 2024-10-02 MED ORDER — ENOXAPARIN SODIUM 30 MG/0.3ML IJ SOSY
30.0000 mg | PREFILLED_SYRINGE | INTRAMUSCULAR | Status: DC
  Administered 2024-10-02: 30 mg via SUBCUTANEOUS
  Filled 2024-10-02: qty 0.3

## 2024-10-02 MED ORDER — ASPIRIN 81 MG PO CHEW
324.0000 mg | CHEWABLE_TABLET | Freq: Once | ORAL | Status: AC
  Administered 2024-10-02: 324 mg via ORAL
  Filled 2024-10-02: qty 4

## 2024-10-02 MED ORDER — ASPIRIN 81 MG PO TBEC
81.0000 mg | DELAYED_RELEASE_TABLET | Freq: Every day | ORAL | Status: DC
  Administered 2024-10-03 – 2024-10-04 (×2): 81 mg via ORAL
  Filled 2024-10-02 (×3): qty 1

## 2024-10-02 MED ORDER — TAMSULOSIN HCL 0.4 MG PO CAPS
0.4000 mg | ORAL_CAPSULE | Freq: Every day | ORAL | Status: DC
  Administered 2024-10-02 – 2024-10-06 (×5): 0.4 mg via ORAL
  Filled 2024-10-02 (×5): qty 1

## 2024-10-02 MED ORDER — HEPARIN BOLUS VIA INFUSION
4000.0000 [IU] | Freq: Once | INTRAVENOUS | Status: AC
  Administered 2024-10-02: 4000 [IU] via INTRAVENOUS
  Filled 2024-10-02: qty 4000

## 2024-10-02 MED ORDER — IOHEXOL 350 MG/ML SOLN
75.0000 mL | Freq: Once | INTRAVENOUS | Status: AC | PRN
  Administered 2024-10-02: 75 mL via INTRAVENOUS

## 2024-10-02 MED ORDER — METOPROLOL TARTRATE 12.5 MG HALF TABLET
12.5000 mg | ORAL_TABLET | Freq: Two times a day (BID) | ORAL | Status: DC
  Administered 2024-10-02 – 2024-10-03 (×3): 12.5 mg via ORAL
  Filled 2024-10-02 (×4): qty 1

## 2024-10-02 MED ORDER — GABAPENTIN 300 MG PO CAPS
300.0000 mg | ORAL_CAPSULE | Freq: Two times a day (BID) | ORAL | Status: DC
  Administered 2024-10-03 – 2024-10-05 (×4): 300 mg via ORAL
  Filled 2024-10-02 (×8): qty 1

## 2024-10-02 MED ORDER — FUROSEMIDE 10 MG/ML IJ SOLN
40.0000 mg | Freq: Once | INTRAMUSCULAR | Status: AC
  Administered 2024-10-02: 40 mg via INTRAVENOUS
  Filled 2024-10-02: qty 4

## 2024-10-02 MED ORDER — ACETAMINOPHEN 325 MG PO TABS: 650.0000 mg | ORAL_TABLET | Freq: Four times a day (QID) | ORAL | Status: DC | PRN

## 2024-10-02 MED ORDER — HEPARIN (PORCINE) 25000 UT/250ML-% IV SOLN
1400.0000 [IU]/h | INTRAVENOUS | Status: DC
  Administered 2024-10-02: 1400 [IU]/h via INTRAVENOUS
  Filled 2024-10-02: qty 250

## 2024-10-02 MED ORDER — ACETAMINOPHEN 650 MG RE SUPP: 650.0000 mg | Freq: Four times a day (QID) | RECTAL | Status: DC | PRN

## 2024-10-02 MED ORDER — INSULIN ASPART 100 UNIT/ML IJ SOLN: 0.0000 [IU] | Freq: Every day | INTRAMUSCULAR | Status: DC

## 2024-10-02 MED ORDER — POTASSIUM CHLORIDE CRYS ER 20 MEQ PO TBCR
20.0000 meq | EXTENDED_RELEASE_TABLET | Freq: Once | ORAL | Status: AC
  Administered 2024-10-02: 20 meq via ORAL
  Filled 2024-10-02: qty 1

## 2024-10-02 MED ORDER — INSULIN ASPART 100 UNIT/ML IJ SOLN: 0.0000 [IU] | Freq: Three times a day (TID) | INTRAMUSCULAR | Status: DC

## 2024-10-02 NOTE — H&P (Signed)
 History and Physical    Raymond Ryan FMW:[id number] DOB: 04-28-1966 DOA: 10/02/2024  PCP: [name] ORN, MD Patient coming from: home  Chief Complaint:   HPI: Raymond Ryan is a 58 y.o. male with medical history significant of type 2 diabetes, hypertension came in with complaints of chest pressure pressure dyspnea on exertion and palpitations for the past 2 weeks.  Complains of PND and orthopnea.  Has a dry cough no pleuritic chest pain he feels there is pressure in his chest.  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  Never had similar symptoms in the past.  Has some nausea but no vomiting no fever chills abdominal pain urinary complaints.  He has been on tirzepatide  and has lost over 90 pounds.  He also had gastric bypass in the past.  He first went to urgent care and he was sent to the ED.  In the ED his high-sensitivity troponin was 223 x 2, D-dimer pending, BNP 764.8.  He was hypertensive tachycardic afebrile with normal saturation on room air.  CT of the chest no evidence of PE scattered ground glass opacities bilaterally with patchy airspace disease at the lung bases possible edema or infiltrate small to moderate bilateral pleural effusions cardiomegaly with moderate pericardial effusion and coronary artery calcifications and aortic atherosclerosis. He denied smoking use of alcohol or recreational drugs.  Past Medical History:  Diagnosis Date   Charcot foot due to diabetes mellitus (HCC)    Constipation    sometimes soft, some hard -- does go daily - are on fiber    Diabetes mellitus    GERD (gastroesophageal reflux disease)    Headache    Hyperlipidemia    Hypertension    Neuromuscular disorder (HCC)    neuropathy    Past Surgical History:  Procedure Laterality Date   LAPAROSCOPIC GASTRIC SLEEVE RESECTION N/A 09/05/2020   Procedure: LAPAROSCOPIC SLEEVE GASTRECTOMY;  Surgeon: Blush Locus, MD;  Location: [facility];  Service: General;  Laterality: N/A;    UPPER GASTROINTESTINAL ENDOSCOPY  2002   Dr Kristie    UPPER GI ENDOSCOPY N/A 09/05/2020   Procedure: UPPER GI ENDOSCOPY;  Surgeon: Blush Locus, MD;  Location: [facility];  Service: General;  Laterality: N/A;    Social History   Socioeconomic History   Marital status: Single    Spouse name: Not on file   Number of children: 1   Years of education: 12   Highest education level: Not on file  Occupational History   Not on file  Tobacco Use   Smoking status: Never   Smokeless tobacco: Never  Vaping Use   Vaping status: Never Used  Substance and Sexual Activity   Alcohol use: No   Drug use: No   Sexual activity: Not on file  Other Topics Concern   Not on file  Social History Narrative   Not on file   Social Drivers of Health   Financial Resource Strain: Not on file  Food Insecurity: Not on file  Transportation Needs: Not on file  Physical Activity: Not on file  Stress: Not on file  Social Connections: Not on file  Intimate Partner Violence: Not on file    No Known Allergies  Family History  Problem Relation Age of Onset   Diabetes Mellitus II Mother    CAD Mother    Prostate cancer Father    Prostate cancer Maternal Uncle    Prostate cancer Maternal Uncle    Colon  cancer Neg Hx    Colon polyps Neg Hx      Prior to Admission medications   Medication Sig Start Date End Date Taking? Authorizing Provider  atorvastatin  (LIPITOR) 20 MG tablet Take 1 tablet (20 mg total) by mouth daily. 03/14/23 12/04/24 Yes [name] ORN, MD  gabapentin  (NEURONTIN ) 300 MG capsule TAKE 1 CAPSULE (300MG  TOTAL) BY MOUTH AT BEDTIME AS NEEDED 08/02/24  Yes [name] ORN, MD  losartan  (COZAAR ) 50 MG tablet Take 1 tablet (50 mg total) by mouth daily. Patient taking differently: Take 50 mg by mouth every evening. 11/12/23  Yes Thapa, [place], MD  metFORMIN  (GLUCOPHAGE -XR) 500 MG 24 hr tablet Take 1 tablet (500 mg total) by mouth daily with supper. Patient taking differently: Take 500 mg by mouth every  evening. 06/16/24  Yes Thapa, [place], MD  Multiple Vitamins-Minerals (CENTRUM SILVER MEN 50+ PO) Take 1 tablet by mouth daily.   Yes [provider]  tamsulosin  (FLOMAX ) 0.4 MG CAPS capsule Take 1 capsule (0.4 mg total) by mouth daily. 03/13/23  Yes [name] ORN, MD  tirzepatide  (MOUNJARO ) 15 MG/0.5ML Pen Inject 15 mg into the skin once a week. Patient taking differently: Inject 15 mg into the skin every Monday. 06/16/24  Yes Thapa, [place], MD  Accu-Chek Softclix Lancets lancets Use as instructed 06/16/24   Thapa, [place], MD  glucose blood (ACCU-CHEK GUIDE TEST) test strip Use to test blood glucose before meals and at bedtime 06/16/24   Thapa, [place], MD    Physical Exam: Vitals:   10/02/24 1330 10/02/24 1400 10/02/24 1430 10/02/24 1713  BP: (!) 141/98 (!) 127/97 (!) 147/108 (!) 136/96  Pulse: (!) 110 (!) 109 (!) 109 (!) 109  Resp: (!) 28 17 (!) 24 13  Temp:    98.7 F (37.1 C)  TempSrc:    Oral  SpO2: 100% 100% 100% 100%  Weight:      Height:         General:  Appears calm and comfortable Eyes:  PERRL, EOMI, normal lids, iris ENT:  grossly normal hearing, lips & tongue, mmm Neck:  no LAD, masses or thyromegaly Cardiovascular:  RRR, no m/r/g. No LE edema.  Respiratory:  CTA bilaterally, no w/r/r. Normal respiratory effort. Abdomen:  soft, ntnd, NABS Skin:  no rash or induration seen on limited exam Musculoskeletal:  grossly normal tone BUE/BLE, good ROM, no bony abnormality Psychiatric:  grossly normal mood and affect, speech fluent and appropriate, AOx3 Neurologic:  CN 2-12 grossly intact, moves all extremities in coordinated fashion, sensation intact  Labs on Admission: I have personally reviewed following labs and imaging studies  CBC: Recent Labs  Lab 10/02/24 1231 10/02/24 1310  WBC 5.1  --   HGB 13.3 13.9  HCT 41.6 41.0  MCV 85.2  --   PLT 476*  --    Basic Metabolic Panel: Recent Labs  Lab 10/02/24 1231 10/02/24 1310  NA 142 144  K 3.6 3.5  CL 110 111   CO2 22  --   GLUCOSE 145* 144*  BUN 10 12  CREATININE 1.51* 1.60*  CALCIUM  8.8*  --    GFR: Estimated Creatinine Clearance: 68.9 mL/min (A) (by C-G formula based on SCr of 1.6 mg/dL (H)). Liver Function Tests: No results for input(s): AST, ALT, ALKPHOS, BILITOT, PROT, ALBUMIN in the last 168 hours. No results for input(s): LIPASE, AMYLASE in the last 168 hours. No results for input(s): AMMONIA in the last 168 hours. Coagulation Profile: No results for input(s): INR,  PROTIME in the last 168 hours. Cardiac Enzymes: No results for input(s): CKTOTAL, CKMB, CKMBINDEX, TROPONINI in the last 168 hours. BNP (last 3 results) No results for input(s): PROBNP in the last 8760 hours. HbA1C: No results for input(s): HGBA1C in the last 72 hours. CBG: No results for input(s): GLUCAP in the last 168 hours. Lipid Profile: No results for input(s): CHOL, HDL, LDLCALC, TRIG, CHOLHDL, LDLDIRECT in the last 72 hours. Thyroid  Function Tests: No results for input(s): TSH, T4TOTAL, FREET4, T3FREE, THYROIDAB in the last 72 hours. Anemia Panel: No results for input(s): VITAMINB12, FOLATE, FERRITIN, TIBC, IRON, RETICCTPCT in the last 72 hours. Urine analysis:    Component Value Date/Time   COLORURINE YELLOW 03/14/2023 1010   APPEARANCEUR CLEAR 03/14/2023 1010   LABSPEC >=1.030 (A) 03/14/2023 1010   PHURINE 6.0 03/14/2023 1010   GLUCOSEU NEGATIVE 03/14/2023 1010   HGBUR NEGATIVE 03/14/2023 1010   BILIRUBINUR NEGATIVE 03/14/2023 1010   BILIRUBINUR neg 07/27/2014 1434   KETONESUR TRACE (A) 03/14/2023 1010   PROTEINUR neg 07/27/2014 1434   PROTEINUR NEGATIVE 07/18/2011 0252   UROBILINOGEN 0.2 03/14/2023 1010   NITRITE NEGATIVE 03/14/2023 1010   LEUKOCYTESUR NEGATIVE 03/14/2023 1010    Creatinine Clearance: Estimated Creatinine Clearance: 68.9 mL/min (A) (by C-G formula based on SCr of 1.6 mg/dL (H)).  Sepsis  Labs: @LABRCNTIP (procalcitonin:4,lacticidven:4) ) Recent Results (from the past 240 hours)  Resp panel by RT-PCR (RSV, Flu A&B, Covid) Anterior Nasal Swab     Status: None   Collection Time: 10/02/24 12:26 PM   Specimen: Anterior Nasal Swab  Result Value Ref Range Status   SARS Coronavirus 2 by RT PCR NEGATIVE NEGATIVE Final   Influenza A by PCR NEGATIVE NEGATIVE Final   Influenza B by PCR NEGATIVE NEGATIVE Final    Comment: (NOTE) The Xpert Xpress SARS-CoV-2/FLU/RSV plus assay is intended as an aid in the diagnosis of influenza from Nasopharyngeal swab specimens and should not be used as a sole basis for treatment. Nasal washings and aspirates are unacceptable for Xpert Xpress SARS-CoV-2/FLU/RSV testing.  Fact Sheet for Patients: BloggerCourse.com  Fact Sheet for Healthcare Providers: SeriousBroker.it  This test is not yet approved or cleared by the [place]  FDA and has been authorized for detection and/or diagnosis of SARS-CoV-2 by FDA under an Emergency Use Authorization (EUA). This EUA will remain in effect (meaning this test can be used) for the duration of the COVID-19 declaration under Section 564(b)(1) of the Act, 21 U.S.C. section 360bbb-3(b)(1), unless the authorization is terminated or revoked.     Resp Syncytial Virus by PCR NEGATIVE NEGATIVE Final    Comment: (NOTE) Fact Sheet for Patients: BloggerCourse.com  Fact Sheet for Healthcare Providers: SeriousBroker.it  This test is not yet approved or cleared by the [place]  FDA and has been authorized for detection and/or diagnosis of SARS-CoV-2 by FDA under an Emergency Use Authorization (EUA). This EUA will remain in effect (meaning this test can be used) for the duration of the COVID-19 declaration under Section 564(b)(1) of the Act, 21 U.S.C. section 360bbb-3(b)(1), unless the authorization is  terminated or revoked.  Performed at [facility], [address] [place], [place] [address]      Radiological Exams on Admission: CT Angio Chest PE W and/or Wo Contrast Result Date: 10/02/2024 CLINICAL DATA:  Pulmonary embolism suspected, high probability. EXAM: CT ANGIOGRAPHY CHEST WITH CONTRAST TECHNIQUE: Multidetector CT imaging of the chest was performed using the standard protocol during bolus administration of intravenous contrast. Multiplanar CT image reconstructions and  MIPs were obtained to evaluate the vascular anatomy. RADIATION DOSE REDUCTION: This exam was performed according to the departmental dose-optimization program which includes automated exposure control, adjustment of the mA and/or kV according to patient size and/or use of iterative reconstruction technique. CONTRAST:  75mL OMNIPAQUE  IOHEXOL  350 MG/ML SOLN COMPARISON:  08/13/2018. FINDINGS: Cardiovascular: The heart is enlarged and there is a moderate pericardial effusion. Scattered coronary artery calcifications are noted. There is atherosclerotic calcification of the aorta without evidence of aneurysm. The pulmonary trunk is mildly distended suggesting underlying pulmonary artery hypertension. No evidence of pulmonary embolism is seen. Evaluation of the distal pulmonary arteries at the lung bases is limited due to suboptimal opacification. Mediastinum/Nodes: Prominent lymph nodes are present in the mediastinum and hilar regions bilaterally, likely reactive. No axillary lymphadenopathy is seen. The thyroid  gland, trachea, and esophagus are within normal limits. There is a small hiatal hernia with evidence of prior gastric surgery. Lungs/Pleura: There are small to moderate bilateral pleural effusions with patchy airspace disease in the bilateral lower lobes. A few scattered ground-glass opacities and subsegmental atelectasis are noted bilaterally. No pneumothorax. Upper Abdomen: Gastric surgery changes are noted. No acute  abnormality. Musculoskeletal: Degenerative changes are present the thoracic spine. No acute osseous abnormality is seen. Review of the MIP images confirms the above findings. IMPRESSION: 1. No evidence of pulmonary embolism. 2. Scattered ground-glass opacities bilaterally with patchy airspace disease at the lung bases, possible edema or infiltrate. 3. Small to moderate bilateral pleural effusions. 4. Cardiomegaly with moderate pericardial effusion and coronary artery calcifications. 5. Aortic atherosclerosis. Electronically Signed   By: Leita Birmingham M.D.   On: 10/02/2024 16:18   DG Chest 2 View Result Date: 10/02/2024 CLINICAL DATA:  Chest pain.  Shortness of breath. EXAM: CHEST - 2 VIEW COMPARISON:  Radiograph from 2018 FINDINGS: The heart is enlarged. Vascular congestion and findings of mild pulmonary edema. There are small pleural effusions, left greater than right, with associated basilar opacities. No pneumothorax. No acute osseous findings. IMPRESSION: Cardiomegaly with vascular congestion and mild pulmonary edema. Small pleural effusions, left greater than right. Electronically Signed   By: Andrea Gasman M.D.   On: 10/02/2024 14:05    EKG: sinus tachycardia with no acute ST-T wave changes. Assessment/Plan Active Problems:   * No active hospital problems. *   #1 new onset CHF/elevated troponin-patient admitted with dyspnea on exertion orthopnea and PND with edema and weight gain palpitations.  Workup shows he has pleural effusion pericardial effusion moderate and elevated BNP and troponin.   Trended troponins, obtain echocardiogram. Lasix 40 twice daily he is tachycardic Placed him on metoprolol 12.5 twice daily, check lipid panel A1c TSH Cardiology consulted by ED physician.  #2 history of hypertension poorly controlled continue Lasix Hold ACE inhibitor in light of diuresis and elevated creatinine.  #3 type 2 diabetes A1c 7.0 latest.  Obtain A1c this admission on metformin  at home will  hold and cover with SSI.  #4 obesity on tirzepatide  history of gastric bypass surgery.    #5 BPH on Flomax  continue    Estimated body mass index is 29.21 kg/m as calculated from the following:   Height as of this encounter: 6' 4 (1.93 m).   Weight as of this encounter: 108.9 kg.   DVT prophylaxis: Lovenox  Code Status: Full code Family Communication: Cousin at bedside Disposition Plan: Pending clinical improvement Consults called: Cardiology Admission status: Inpatient   Almarie KANDICE Hoots MD   10/02/2024, 6:01 PM

## 2024-10-02 NOTE — ED Triage Notes (Signed)
 Pt c.o left sided chest pain, sob and cough. States when he lays down he feels like he is filling up with phlegm. Low grade fever in triage, took tylenol  this am

## 2024-10-02 NOTE — ED Notes (Signed)
 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

## 2024-10-02 NOTE — ED Triage Notes (Signed)
 Patient c/o SOB and cough x 1 week. Patient states he has been having wheezing at night. Patient denies chest pain, but states he has been having palpitations.

## 2024-10-02 NOTE — Discharge Instructions (Signed)
 BP (!) 138/105 (BP Location: Right Arm)   Pulse (!) 127   Temp 98.2 F (36.8 C) (Oral)   Resp 18   SpO2 100%

## 2024-10-02 NOTE — ED Provider Notes (Signed)
 [facility]   [id number] 10/02/24 Arrival Time: 1137  ASSESSMENT & PLAN:  1. Shortness of breath   2. Tachycardia    Unclear etiology. Cannot r/o cardiac/PE/new CHF? Is SOB here. Lung sounds very quiet. I could not appreciated any wheezing. Denies specific CP but has felt palpitations over past week. Denies assoc n/v/d.  ECG with sinus tachycardia. No STEMI.  To ED via POV; stable upon discharge.  Reviewed expectations re: course of current medical issues. Questions answered. Outlined signs and symptoms indicating need for more acute intervention. Patient verbalized understanding. After Visit Summary given.   SUBJECTIVE:  History from: patient. Raymond Ryan is a 58 y.o. male with DM/HTN who presents with complaint of feeling SOB; mostly intermittent over past week but more persistent now; with assoc palpitations at time. SOB is worse when supine; better when sitting; much worse with exertion. Feels like LE have been swollen at time; a little better now. Denies CP/n/v/diaphoresis. Denies smoking history. Denies recreational drug and alcohol use.  OBJECTIVE:  Vitals:   10/02/24 1142  BP: (!) 138/105  Pulse: (!) 127  Resp: 18  Temp: 98.2 F (36.8 C)  TempSrc: Oral  SpO2: 100%    General appearance: alert, oriented, no acute distress Eyes: PERRLA; EOMI; conjunctivae normal HENT: normocephalic; atraumatic Neck: supple with FROM Lungs: without labored respirations; speaks full sentences without difficulty; CTAB Heart: regular; tahcycardic Chest Wall: without tenderness to palpation Abdomen: soft, non-tender; no guarding or rebound tenderness Extremities: mild slight edema of LE; without calf swelling or tenderness; symmetrical without gross deformities Skin: warm and dry; without rash or lesions Neuro: normal gait Psychological: alert and cooperative; normal mood and affect  Labs:  Labs Reviewed - No data to display  Imaging: No results found.    No Known Allergies  Past Medical History:  Diagnosis Date   Charcot foot due to diabetes mellitus (HCC)    Constipation    sometimes soft, some hard -- does go daily - are on fiber    Diabetes mellitus    GERD (gastroesophageal reflux disease)    Headache    Hyperlipidemia    Hypertension    Neuromuscular disorder (HCC)    neuropathy   Social History   Socioeconomic History   Marital status: Single    Spouse name: Not on file   Number of children: 1   Years of education: 12   Highest education level: Not on file  Occupational History   Not on file  Tobacco Use   Smoking status: Never   Smokeless tobacco: Never  Vaping Use   Vaping status: Never Used  Substance and Sexual Activity   Alcohol use: No   Drug use: No   Sexual activity: Not on file  Other Topics Concern   Not on file  Social History Narrative   Not on file   Social Drivers of Health   Financial Resource Strain: Not on file  Food Insecurity: Not on file  Transportation Needs: Not on file  Physical Activity: Not on file  Stress: Not on file  Social Connections: Not on file  Intimate Partner Violence: Not on file   Family History  Problem Relation Age of Onset   Diabetes Mellitus II Mother    CAD Mother    Prostate cancer Father    Prostate cancer Maternal Uncle    Prostate cancer Maternal Uncle    Colon cancer Neg Hx    Colon polyps Neg Hx    Past Surgical History:  Procedure Laterality Date   LAPAROSCOPIC GASTRIC SLEEVE RESECTION N/A 09/05/2020   Procedure: LAPAROSCOPIC SLEEVE GASTRECTOMY;  Surgeon: Tanda Locus, MD;  Location: [facility];  Service: General;  Laterality: N/A;   UPPER GASTROINTESTINAL ENDOSCOPY  2002   Dr Kristie    UPPER GI ENDOSCOPY N/A 09/05/2020   Procedure: UPPER GI ENDOSCOPY;  Surgeon: Tanda Locus, MD;  Location: [facility];  Service: General;  Laterality: MARISELA Rolinda Rogue, MD 10/02/24 1213

## 2024-10-02 NOTE — ED Provider Notes (Signed)
 [place] EMERGENCY DEPARTMENT AT [facility] Provider Note   CSN: [id number] Arrival date & time: 10/02/24  1213     Patient presents with: Shortness of Breath, Chest Pain, and Cough   Raymond Ryan is a 58 y.o. male.   58 year old male with history of diabetes and CKD who presents to the emergency department with shortness of breath.  Patient reports that since Monday he has been feeling short of breath.  Says it is worsened when he lays flat.  Also has had some bilateral lower extremity swelling.  Says that he has left-sided shoulder pain when he lays flat.  Not exertional or pleuritic.  No diaphoresis or vomiting.  No history of heart or lung problems.  Went to urgent care and they referred him here for additional evaluation       Prior to Admission medications   Medication Sig Start Date End Date Taking? Authorizing Provider  atorvastatin  (LIPITOR) 20 MG tablet Take 1 tablet (20 mg total) by mouth daily. 03/14/23 12/04/24 Yes [name] ORN, MD  gabapentin  (NEURONTIN ) 300 MG capsule TAKE 1 CAPSULE (300MG  TOTAL) BY MOUTH AT BEDTIME AS NEEDED 08/02/24  Yes [name] ORN, MD  losartan  (COZAAR ) 50 MG tablet Take 1 tablet (50 mg total) by mouth daily. Patient taking differently: Take 50 mg by mouth every evening. 11/12/23  Yes Thapa, [place], MD  metFORMIN  (GLUCOPHAGE -XR) 500 MG 24 hr tablet Take 1 tablet (500 mg total) by mouth daily with supper. Patient taking differently: Take 500 mg by mouth every evening. 06/16/24  Yes Thapa, [place], MD  Multiple Vitamins-Minerals (CENTRUM SILVER MEN 50+ PO) Take 1 tablet by mouth daily.   Yes [provider]  tamsulosin  (FLOMAX ) 0.4 MG CAPS capsule Take 1 capsule (0.4 mg total) by mouth daily. 03/13/23  Yes [name] ORN, MD  tirzepatide  (MOUNJARO ) 15 MG/0.5ML Pen Inject 15 mg into the skin once a week. Patient taking differently: Inject 15 mg into the skin every Monday. 06/16/24  Yes Thapa, [place], MD  Accu-Chek Softclix Lancets  lancets Use as instructed 06/16/24   Thapa, [place], MD  glucose blood (ACCU-CHEK GUIDE TEST) test strip Use to test blood glucose before meals and at bedtime 06/16/24   Thapa, [place], MD    Allergies: Patient has no known allergies.    Review of Systems  Updated Vital Signs BP (!) 136/96 (BP Location: Right Arm)   Pulse (!) 109   Temp 98.7 F (37.1 C) (Oral)   Resp 13   Ht 6' 4 (1.93 m)   Wt 108.9 kg   SpO2 100%   BMI 29.21 kg/m   Physical Exam Vitals and nursing note reviewed.  Constitutional:      General: He is not in acute distress.    Appearance: He is well-developed.  HENT:     Head: Normocephalic and atraumatic.     Right Ear: External ear normal.     Left Ear: External ear normal.     Nose: Nose normal.  Eyes:     Extraocular Movements: Extraocular movements intact.     Conjunctiva/sclera: Conjunctivae normal.     Pupils: Pupils are equal, round, and reactive to light.  Cardiovascular:     Rate and Rhythm: Regular rhythm. Tachycardia present.     Heart sounds: Normal heart sounds.  Pulmonary:     Effort: Pulmonary effort is normal. No respiratory distress.     Comments: Diminished bibasilarly Musculoskeletal:     Cervical back: Normal range of motion  and neck supple.     Right lower leg: Edema (1+) present.     Left lower leg: Edema (1+) present.  Skin:    General: Skin is warm and dry.  Neurological:     Mental Status: He is alert. Mental status is at baseline.  Psychiatric:        Mood and Affect: Mood normal.        Behavior: Behavior normal.     (all labs ordered are listed, but only abnormal results are displayed) Labs Reviewed  BASIC METABOLIC PANEL WITH GFR - Abnormal; Notable for the following components:      Result Value   Glucose, Bld 145 (*)    Creatinine, Ser 1.51 (*)    Calcium  8.8 (*)    GFR, Estimated 54 (*)    All other components within normal limits  CBC - Abnormal; Notable for the following components:   Platelets 476 (*)     All other components within normal limits  BRAIN NATRIURETIC PEPTIDE - Abnormal; Notable for the following components:   B Natriuretic Peptide 764.8 (*)    All other components within normal limits  I-STAT CHEM 8, ED - Abnormal; Notable for the following components:   Creatinine, Ser 1.60 (*)    Glucose, Bld 144 (*)    Calcium , Ion 1.08 (*)    TCO2 21 (*)    All other components within normal limits  TROPONIN I (HIGH SENSITIVITY) - Abnormal; Notable for the following components:   Troponin I (High Sensitivity) 223 (*)    All other components within normal limits  TROPONIN I (HIGH SENSITIVITY) - Abnormal; Notable for the following components:   Troponin I (High Sensitivity) 223 (*)    All other components within normal limits  RESP PANEL BY RT-PCR (RSV, FLU A&B, COVID)  [id number]    EKG: EKG Interpretation Date/Time:  Saturday October 02 2024 12:38:01 EDT Ventricular Rate:  115 PR Interval:  166 QRS Duration:  88 QT Interval:  320 QTC Calculation: 442 R Axis:   69  Text Interpretation: Sinus tachycardia Nonspecific T wave abnormality Abnormal ECG Confirmed by Yolande Charleston [phone]) on 10/02/2024 12:52:10 PM  Radiology: CT Angio Chest PE W and/or Wo Contrast Result Date: 10/02/2024 CLINICAL DATA:  Pulmonary embolism suspected, high probability. EXAM: CT ANGIOGRAPHY CHEST WITH CONTRAST TECHNIQUE: Multidetector CT imaging of the chest was performed using the standard protocol during bolus administration of intravenous contrast. Multiplanar CT image reconstructions and MIPs were obtained to evaluate the vascular anatomy. RADIATION DOSE REDUCTION: This exam was performed according to the departmental dose-optimization program which includes automated exposure control, adjustment of the mA and/or kV according to patient size and/or use of iterative reconstruction technique. CONTRAST:  75mL OMNIPAQUE  IOHEXOL  350 MG/ML SOLN COMPARISON:  08/13/2018. FINDINGS: Cardiovascular: The heart is  enlarged and there is a moderate pericardial effusion. Scattered coronary artery calcifications are noted. There is atherosclerotic calcification of the aorta without evidence of aneurysm. The pulmonary trunk is mildly distended suggesting underlying pulmonary artery hypertension. No evidence of pulmonary embolism is seen. Evaluation of the distal pulmonary arteries at the lung bases is limited due to suboptimal opacification. Mediastinum/Nodes: Prominent lymph nodes are present in the mediastinum and hilar regions bilaterally, likely reactive. No axillary lymphadenopathy is seen. The thyroid  gland, trachea, and esophagus are within normal limits. There is a small hiatal hernia with evidence of prior gastric surgery. Lungs/Pleura: There are small to moderate bilateral pleural effusions with patchy airspace disease in the bilateral lower lobes.  A few scattered ground-glass opacities and subsegmental atelectasis are noted bilaterally. No pneumothorax. Upper Abdomen: Gastric surgery changes are noted. No acute abnormality. Musculoskeletal: Degenerative changes are present the thoracic spine. No acute osseous abnormality is seen. Review of the MIP images confirms the above findings. IMPRESSION: 1. No evidence of pulmonary embolism. 2. Scattered ground-glass opacities bilaterally with patchy airspace disease at the lung bases, possible edema or infiltrate. 3. Small to moderate bilateral pleural effusions. 4. Cardiomegaly with moderate pericardial effusion and coronary artery calcifications. 5. Aortic atherosclerosis. Electronically Signed   By: Leita Birmingham M.D.   On: 10/02/2024 16:18   DG Chest 2 View Result Date: 10/02/2024 CLINICAL DATA:  Chest pain.  Shortness of breath. EXAM: CHEST - 2 VIEW COMPARISON:  Radiograph from 2018 FINDINGS: The heart is enlarged. Vascular congestion and findings of mild pulmonary edema. There are small pleural effusions, left greater than right, with associated basilar opacities. No  pneumothorax. No acute osseous findings. IMPRESSION: Cardiomegaly with vascular congestion and mild pulmonary edema. Small pleural effusions, left greater than right. Electronically Signed   By: Andrea Gasman M.D.   On: 10/02/2024 14:05     Procedures   Medications Ordered in the ED  furosemide (LASIX) injection 40 mg (has no administration in time range)  aspirin chewable tablet 324 mg (324 mg Oral Given 10/02/24 1355)  heparin  bolus via infusion 4,000 Units (4,000 Units Intravenous Bolus from Bag 10/02/24 1423)  iohexol  (OMNIPAQUE ) 350 MG/ML injection 75 mL (75 mLs Intravenous Contrast Given 10/02/24 1545)    Clinical Course as of 10/02/24 1724  Sat Oct 02, 2024  1704 Dw Dr Donley from cardiology.  Feel that heparin  drip can be discontinued.  Recommends admission and diuresis with Lasix.  They will see the patient when admitted [RP]  1712 Discussed with Dr Alvia from hospitalist.  [RP]    Clinical Course User Index [RP] Yolande Lamar BROCKS, MD                                 Medical Decision Making Amount and/or Complexity of Data Reviewed Labs: ordered. Radiology: ordered.  Risk OTC drugs. Prescription drug management. Decision regarding hospitalization.   58 year old male with history of diabetes and CKD who presents to the emergency department with shortness of breath.  Initial Ddx:  CHF, MI, PE, myocarditis, anemia, pericarditis  MDM/Course:  Patient presents emergency department with shortness of breath.  Has had orthopnea recently.  Also is having lower extremity swelling.  He is describing left-sided shoulder pain as well.  Tachycardic but satting well on room air.  Hemodynamically stable otherwise.  Not in respiratory distress.  Does have some lower extremity edema.  X-ray does show nonspecific ST changes.  Chest x-ray did show pulmonary edema as BNP and troponin were also elevated.  Due to concerns for NSTEMI was given aspirin and started on heparin  since his  troponin was 223.  He underwent a CTA which did not show evidence of PE but did show pleural effusions, pulmonary edema, and a moderate pericardial effusion.  Discussed with cardiology who felt that the heparin  could be discontinued and he could be admitted to hospitalist with diuresis and echo.  Upon re-evaluation patient remained stable.  Admitted to hospitalist for further management  This patient presents to the ED for concern of complaints listed in HPI, this involves an extensive number of treatment options, and is a complaint that carries with it a  high risk of complications and morbidity. Disposition including potential need for admission considered.   Dispo: Admit to Floor  Additional history obtained from family Records reviewed Outpatient Clinic Notes The following labs were independently interpreted: Chemistry and show CKD I independently reviewed the following imaging with scope of interpretation limited to determining acute life threatening conditions related to emergency care: Chest x-ray and agree with the radiologist interpretation with the following exceptions: none I personally reviewed and interpreted cardiac monitoring: sinus tachycardia I personally reviewed and interpreted the pt's EKG: see above for interpretation  I have reviewed the patients home medications and made adjustments as needed Consults: Cardiology and Hospitalist  Portions of this note were generated with Dragon dictation software. Dictation errors may occur despite best attempts at proofreading.     Final diagnoses:  New onset of congestive heart failure (HCC)  Elevated troponin  Pericardial effusion  Pleural effusion    ED Discharge Orders     None          Yolande Lamar BROCKS, MD 10/02/24 1724

## 2024-10-02 NOTE — Progress Notes (Signed)
 PHARMACY - ANTICOAGULATION CONSULT NOTE  Pharmacy Consult for heparin  Indication: chest pain/ACS  No Known Allergies  Patient Measurements: Height: 6' 4 (193 cm) Weight: 108.9 kg (240 lb) IBW/kg (Calculated) : 86.8 HEPARIN  DW (KG): 108.6  Vital Signs: Temp: 99.4 F (37.4 C) (10/04 1223) Temp Source: Oral (10/04 1142) BP: 131/93 (10/04 1223) Pulse Rate: 115 (10/04 1223)  Labs: Recent Labs    10/02/24 1231 10/02/24 1310  HGB 13.3 13.9  HCT 41.6 41.0  PLT 476*  --   CREATININE 1.51* 1.60*  TROPONINIHS 223*  --     Estimated Creatinine Clearance: 68.9 mL/min (A) (by C-G formula based on SCr of 1.6 mg/dL (H)).   Medical History: Past Medical History:  Diagnosis Date   Charcot foot due to diabetes mellitus (HCC)    Constipation    sometimes soft, some hard -- does go daily - are on fiber    Diabetes mellitus    GERD (gastroesophageal reflux disease)    Headache    Hyperlipidemia    Hypertension    Neuromuscular disorder (HCC)    neuropathy    Assessment: 58 YOM presenting with CP, SOB, elevated troponin, he is not on anticoagulation PTA, H/H wnl, plts 476  Goal of Therapy:  Heparin  level 0.3-0.7 units/ml Monitor platelets by anticoagulation protocol: Yes   Plan:  Heparin  4000 units IV x 1, and gtt at 1400 units/hr F/u 6 hour heparin  level F/u cards eval and recs  Dorn Poot, PharmD, [facility] Clinical Pharmacist ED Pharmacist Phone # [phone] 10/02/2024 1:56 PM

## 2024-10-02 NOTE — ED Notes (Signed)
 Patient is being discharged from the Urgent Care and sent to the Emergency Department via POV . Per Dr. Rolinda, patient is in need of higher level of care due to SOB and tachycardia. Patient is aware and verbalizes understanding of plan of care.  Vitals:   10/02/24 1142  BP: (!) 138/105  Pulse: (!) 127  Resp: 18  Temp: 98.2 F (36.8 C)  SpO2: 100%

## 2024-10-02 NOTE — ED Triage Notes (Signed)
 Patient sent from urgent care for [facility] since Monday. States that his upper left chest is sore but it feels similar to muscle pain. Denies any other symptoms.

## 2024-10-02 NOTE — ED Notes (Signed)
 CCMD called.

## 2024-10-02 NOTE — ED Notes (Signed)
 Pt is having reservations about being admitted d/t worrying about financial bill from hospital and having to work. RN encourage pt staying will be the best option d/t EDP concerns. Pt is aware Cardiology team will evaluate his stay and up date him on next course of action.

## 2024-10-03 ENCOUNTER — Inpatient Hospital Stay (HOSPITAL_COMMUNITY)

## 2024-10-03 DIAGNOSIS — E785 Hyperlipidemia, unspecified: Secondary | ICD-10-CM | POA: Diagnosis not present

## 2024-10-03 DIAGNOSIS — I509 Heart failure, unspecified: Secondary | ICD-10-CM

## 2024-10-03 DIAGNOSIS — R9431 Abnormal electrocardiogram [ECG] [EKG]: Secondary | ICD-10-CM

## 2024-10-03 DIAGNOSIS — I10 Essential (primary) hypertension: Secondary | ICD-10-CM | POA: Diagnosis not present

## 2024-10-03 LAB — CBC
HCT: 39.8 % (ref 39.0–52.0)
Hemoglobin: 12.5 g/dL — ABNORMAL LOW (ref 13.0–17.0)
MCH: 26.8 pg (ref 26.0–34.0)
MCHC: 31.4 g/dL (ref 30.0–36.0)
MCV: 85.2 fL (ref 80.0–100.0)
Platelets: 428 K/uL — ABNORMAL HIGH (ref 150–400)
RBC: 4.67 MIL/uL (ref 4.22–5.81)
RDW: 14.2 % (ref 11.5–15.5)
WBC: 5.3 K/uL (ref 4.0–10.5)
nRBC: 0 % (ref 0.0–0.2)

## 2024-10-03 LAB — ECHOCARDIOGRAM COMPLETE
AR max vel: 2.52 cm2
AV Area VTI: 2.42 cm2
AV Area mean vel: 2.44 cm2
AV Mean grad: 2 mmHg
AV Peak grad: 4.4 mmHg
Ao pk vel: 1.05 m/s
Area-P 1/2: 7.29 cm2
Calc EF: 35.6 %
Height: 76 in
MV M vel: 3.39 m/s
MV Peak grad: 46 mmHg
MV VTI: 1.87 cm2
S' Lateral: 5 cm
Single Plane A2C EF: 39.5 %
Single Plane A4C EF: 37.5 %
Weight: 3947.12 [oz_av]

## 2024-10-03 LAB — COMPREHENSIVE METABOLIC PANEL WITH GFR
ALT: 11 U/L (ref 0–44)
AST: 13 U/L — ABNORMAL LOW (ref 15–41)
Albumin: 3.1 g/dL — ABNORMAL LOW (ref 3.5–5.0)
Alkaline Phosphatase: 54 U/L (ref 38–126)
Anion gap: 12 (ref 5–15)
BUN: 11 mg/dL (ref 6–20)
CO2: 26 mmol/L (ref 22–32)
Calcium: 8.9 mg/dL (ref 8.9–10.3)
Chloride: 106 mmol/L (ref 98–111)
Creatinine, Ser: 1.52 mg/dL — ABNORMAL HIGH (ref 0.61–1.24)
GFR, Estimated: 53 mL/min — ABNORMAL LOW (ref >60)
Glucose, Bld: 96 mg/dL (ref 70–99)
Potassium: 3.9 mmol/L (ref 3.5–5.1)
Sodium: 144 mmol/L (ref 135–145)
Total Bilirubin: 1.1 mg/dL (ref 0.0–1.2)
Total Protein: 6.3 g/dL — ABNORMAL LOW (ref 6.5–8.1)

## 2024-10-03 LAB — LIPID PANEL
Cholesterol: 161 mg/dL (ref 0–200)
HDL: 42 mg/dL (ref >40)
LDL Cholesterol: 102 mg/dL — ABNORMAL HIGH (ref 0–99)
Total CHOL/HDL Ratio: 3.8 ratio
Triglycerides: 84 mg/dL (ref <150)
VLDL: 17 mg/dL (ref 0–40)

## 2024-10-03 LAB — GLUCOSE, CAPILLARY
Glucose-Capillary: 93 mg/dL (ref 70–99)
Glucose-Capillary: 177 mg/dL — ABNORMAL HIGH (ref 70–99)
Glucose-Capillary: 102 mg/dL — ABNORMAL HIGH (ref 70–99)
Glucose-Capillary: 111 mg/dL — ABNORMAL HIGH (ref 70–99)

## 2024-10-03 LAB — TROPONIN I (HIGH SENSITIVITY): Troponin I (High Sensitivity): 186 ng/L (ref <18)

## 2024-10-03 MED ORDER — PERFLUTREN LIPID MICROSPHERE
1.0000 mL | INTRAVENOUS | Status: AC | PRN
  Administered 2024-10-03: 4 mL via INTRAVENOUS

## 2024-10-03 MED ORDER — ENOXAPARIN SODIUM 40 MG/0.4ML IJ SOSY
40.0000 mg | PREFILLED_SYRINGE | INTRAMUSCULAR | Status: DC
  Administered 2024-10-03 – 2024-10-05 (×3): 40 mg via SUBCUTANEOUS
  Filled 2024-10-03 (×3): qty 0.4

## 2024-10-03 MED ORDER — LOSARTAN POTASSIUM 25 MG PO TABS
25.0000 mg | ORAL_TABLET | Freq: Every evening | ORAL | Status: DC
  Administered 2024-10-03: 25 mg via ORAL
  Filled 2024-10-03: qty 1

## 2024-10-03 MED ORDER — ATORVASTATIN CALCIUM 40 MG PO TABS
40.0000 mg | ORAL_TABLET | Freq: Every day | ORAL | Status: DC
  Administered 2024-10-03 – 2024-10-06 (×4): 40 mg via ORAL
  Filled 2024-10-03 (×4): qty 1

## 2024-10-03 NOTE — Plan of Care (Signed)
  Problem: Education: Goal: Ability to describe self-care measures that may prevent or decrease complications (Diabetes Survival Skills Education) will improve Outcome: Progressing   Problem: Coping: Goal: Ability to adjust to condition or change in health will improve Outcome: Progressing   Problem: Fluid Volume: Goal: Ability to maintain a balanced intake and output will improve Outcome: Progressing   Problem: Health Behavior/Discharge Planning: Goal: Ability to identify and utilize available resources and services will improve Outcome: Progressing Goal: Ability to manage health-related needs will improve Outcome: Progressing   Problem: Nutritional: Goal: Maintenance of adequate nutrition will improve Outcome: Progressing   Problem: Skin Integrity: Goal: Risk for impaired skin integrity will decrease Outcome: Progressing   Problem: Education: Goal: Knowledge of General Education information will improve Description: Including pain rating scale, medication(s)/side effects and non-pharmacologic comfort measures Outcome: Progressing   Problem: Health Behavior/Discharge Planning: Goal: Ability to manage health-related needs will improve Outcome: Progressing   Problem: Activity: Goal: Risk for activity intolerance will decrease Outcome: Progressing   Problem: Nutrition: Goal: Adequate nutrition will be maintained Outcome: Progressing   Problem: Metabolic: Goal: Ability to maintain appropriate glucose levels will improve Outcome: Not Progressing   Problem: Nutritional: Goal: Progress toward achieving an optimal weight will improve Outcome: Not Progressing   Problem: Clinical Measurements: Goal: Ability to maintain clinical measurements within normal limits will improve Outcome: Not Progressing

## 2024-10-03 NOTE — Consult Note (Addendum)
 Cardiology Consultation   Patient ID: Raymond Ryan MRN: [id number]; DOB: Mar 02, 1966  Admit date: 10/02/2024 Date of Consult: 10/03/2024  PCP:  [name] ORN, MD   [facility] Providers Cardiologist:  [facility]   Patient Profile: Raymond Ryan is a 58 y.o. male with a hx of HTN, HLD, Type II DM, GERD and history of gastric bypass who is being seen 10/03/2024 for the evaluation of CHF at the request of Dr. Alvia.  History of Present Illness:  Raymond Ryan presented to [facility] yesterday for evaluation of shortness of breath and coughing for the past week. He was noted to be tachycardic with heart rate in the 120's and significantly short of breath, therefore he was advised to go to the ED for further assessment and treatment.  Initial labs showed WBC 5.1, Hgb 13.3, platelets 476, Na+ 142, K+ 3.6 and creatinine 1.51 (previously 1.37 in 02/2024). BNP elevated at 764. TSH 3.564. Hgb A1c at 6.1. FLP showed total cholesterol at 161, HDL 42, LDL 102.  Initial and repeat Hs troponin values have been elevated at 223, 223, 217 and 186. CXR showed cardiomegaly and vascular congestion with mild pulmonary edema and small pleural effusions. CTA showed no evidence of a PE but was noted to have scattered, ground glass opacities bilaterally which may represent possible edema or infiltrate along with small to moderate bilateral pleural effusions and cardiomegaly with a moderate pericardial effusion and coronary artery calcification. EKG shows sinus tachycardia, HR 119 with PVC's.   He was started on ASA 81 mg daily, Atorvastatin  20 mg daily, Lopressor 12.5mg  BID and IV Heparin . Received 40 mg IV Lasix while in the ED and now started on IV Lasix 40 mg twice daily. I&O's not fully recorded and weight has not been checked for today but at 246 lbs on admission. Echo pending.   In talking with the patient today, he reports having worsening shortness of breath at rest and  with activity for the past 8 to 9 days. He reports orthopnea and PND and has been sleeping in a recliner. He initially thought symptoms were due to a cold and has been taking Mucinex and NyQuil without relief. He works as a Midwife and reports shortness of breath with walking across the parking lot on Friday which prompted him to seek evaluation yesterday. Reports chest pain when he cannot catch his breath. Has also noted occasional palpitations at times. Says that he urinated multiple times with IV Lasix overnight and notes significant improvement in symptoms today. He denies any personal history of CAD, CHF or cardiac arrhythmias. Reports his mother died of an MI in her 32's. He denies any alcohol use, tobacco use or recreational drug use.   Past Medical History:  Diagnosis Date   Charcot foot due to diabetes mellitus (HCC)    Constipation    sometimes soft, some hard -- does go daily - are on fiber    Diabetes mellitus    GERD (gastroesophageal reflux disease)    Headache    Hyperlipidemia    Hypertension    Neuromuscular disorder (HCC)    neuropathy    Past Surgical History:  Procedure Laterality Date   LAPAROSCOPIC GASTRIC SLEEVE RESECTION N/A 09/05/2020   Procedure: LAPAROSCOPIC SLEEVE GASTRECTOMY;  Surgeon: Blush Locus, MD;  Location: [facility];  Service: General;  Laterality: N/A;   UPPER GASTROINTESTINAL ENDOSCOPY  2002   Dr Kristie    UPPER GI ENDOSCOPY N/A 09/05/2020  Procedure: UPPER GI ENDOSCOPY;  Surgeon: Tanda Locus, MD;  Location: [facility];  Service: General;  Laterality: N/A;     Home Medications:  Prior to Admission medications   Medication Sig Start Date End Date Taking? Authorizing Provider  atorvastatin  (LIPITOR) 20 MG tablet Take 1 tablet (20 mg total) by mouth daily. 03/14/23 12/04/24 Yes [name] ORN, MD  gabapentin  (NEURONTIN ) 300 MG capsule TAKE 1 CAPSULE (300MG  TOTAL) BY MOUTH AT BEDTIME AS NEEDED 08/02/24  Yes [name] ORN, MD  losartan  (COZAAR ) 50 MG tablet Take 1  tablet (50 mg total) by mouth daily. Patient taking differently: Take 50 mg by mouth every evening. 11/12/23  Yes Thapa, [place], MD  metFORMIN  (GLUCOPHAGE -XR) 500 MG 24 hr tablet Take 1 tablet (500 mg total) by mouth daily with supper. Patient taking differently: Take 500 mg by mouth every evening. 06/16/24  Yes Thapa, [place], MD  Multiple Vitamins-Minerals (CENTRUM SILVER MEN 50+ PO) Take 1 tablet by mouth daily.   Yes [provider]  tamsulosin  (FLOMAX ) 0.4 MG CAPS capsule Take 1 capsule (0.4 mg total) by mouth daily. 03/13/23  Yes [name] ORN, MD  tirzepatide  (MOUNJARO ) 15 MG/0.5ML Pen Inject 15 mg into the skin once a week. Patient taking differently: Inject 15 mg into the skin every Monday. 06/16/24  Yes Thapa, [place], MD  Accu-Chek Softclix Lancets lancets Use as instructed 06/16/24   Thapa, [place], MD  glucose blood (ACCU-CHEK GUIDE TEST) test strip Use to test blood glucose before meals and at bedtime 06/16/24   Thapa, [place], MD    Scheduled Meds:  aspirin EC  81 mg Oral Daily   atorvastatin   40 mg Oral Daily   enoxaparin  (LOVENOX ) injection  30 mg Subcutaneous Q24H   furosemide  40 mg Intravenous BID   gabapentin   300 mg Oral BID   insulin  aspart  0-15 Units Subcutaneous TID WC   insulin  aspart  0-5 Units Subcutaneous QHS   losartan   25 mg Oral QPM   metoprolol tartrate  12.5 mg Oral BID   tamsulosin   0.4 mg Oral Daily   Continuous Infusions:  PRN Meds: acetaminophen  **OR** acetaminophen   Allergies:   No Known Allergies  Social History:   Social History   Socioeconomic History   Marital status: Single    Spouse name: Not on file   Number of children: 1   Years of education: 12   Highest education level: Not on file  Occupational History   Not on file  Tobacco Use   Smoking status: Never   Smokeless tobacco: Never  Vaping Use   Vaping status: Never Used  Substance and Sexual Activity   Alcohol use: No   Drug use: No   Sexual activity: Not on file  Other  Topics Concern   Not on file  Social History Narrative   Not on file   Social Drivers of Health   Financial Resource Strain: Not on file  Food Insecurity: No Food Insecurity (10/02/2024)   Hunger Vital Sign    Worried About Running Out of Food in the Last Year: Never true    Ran Out of Food in the Last Year: Never true  Transportation Needs: No Transportation Needs (10/02/2024)   PRAPARE - Administrator, Civil Service (Medical): No    Lack of Transportation (Non-Medical): No  Physical Activity: Not on file  Stress: Not on file  Social Connections: Not on file  Intimate Partner Violence: Not At Risk (10/02/2024)   Humiliation,  Afraid, Rape, and Kick questionnaire    Fear of Current or Ex-Partner: No    Emotionally Abused: No    Physically Abused: No    Sexually Abused: No    Family History:    Family History  Problem Relation Age of Onset   Diabetes Mellitus II Mother    CAD Mother    Prostate cancer Father    Prostate cancer Maternal Uncle    Prostate cancer Maternal Uncle    Colon cancer Neg Hx    Colon polyps Neg Hx      ROS:  Please see the history of present illness.   All other ROS reviewed and negative.     Physical Exam/Data: Vitals:   10/02/24 1949 10/02/24 2332 10/03/24 0322 10/03/24 0720  BP: (!) 133/93 118/74 (!) 126/96 (!) 124/97  Pulse: (!) 109 (!) 105 100 (!) 103  Resp: 18 18 16 20   Temp: 98.3 F (36.8 C) 98.4 F (36.9 C) 98 F (36.7 C) 98.1 F (36.7 C)  TempSrc: Oral Oral Oral Oral  SpO2: 100% 94% 95% 99%  Weight: 111.9 kg     Height:        Intake/Output Summary (Last 24 hours) at 10/03/2024 0920 Last data filed at 10/03/2024 9147 Gross per 24 hour  Intake 358 ml  Output --  Net 358 ml      10/02/2024    7:49 PM 10/02/2024   12:25 PM 06/16/2024   10:31 AM  Last 3 Weights  Weight (lbs) 246 lb 11.1 oz 240 lb 250 lb 3.2 oz  Weight (kg) 111.9 kg 108.863 kg 113.49 kg     Body mass index is 30.03 kg/m.  General:  Well  nourished, well developed male appearing in no acute distress HEENT: normal Neck: no JVD Vascular: No carotid bruits; Distal pulses 2+ bilaterally Cardiac:  normal S1, S2; Regular rhythm, tachycardiac rate.  Lungs: rales along bases bilaterally. No wheezing.  Abd: soft, nontender, no hepatomegaly  Ext: no pitting edema Musculoskeletal:  No deformities, BUE and BLE strength normal and equal Skin: warm and dry  Neuro:  CNs 2-12 intact, no focal abnormalities noted Psych:  Normal affect   Telemetry:  Telemetry was personally reviewed and demonstrates:  NSR with episodes of sinus tachycardia, HR in 90's to low-100's. Occasional PVC's.   Relevant CV Studies:  Echocardiogram: Pending  Laboratory Data: High Sensitivity Troponin:   Recent Labs  Lab 10/02/24 1231 10/02/24 1522 10/02/24 2104 10/03/24 0252  TROPONINIHS 223* 223* 217* 186*     Chemistry Recent Labs  Lab 10/02/24 1231 10/02/24 1310 10/02/24 2104 10/03/24 0252  NA 142 144  --  144  K 3.6 3.5  --  3.9  CL 110 111  --  106  CO2 22  --   --  26  GLUCOSE 145* 144*  --  96  BUN 10 12  --  11  CREATININE 1.51* 1.60* 1.57* 1.52*  CALCIUM  8.8*  --   --  8.9  GFRNONAA 54*  --  51* 53*  ANIONGAP 10  --   --  12    Recent Labs  Lab 10/03/24 0252  PROT 6.3*  ALBUMIN 3.1*  AST 13*  ALT 11  ALKPHOS 54  BILITOT 1.1   Lipids  Recent Labs  Lab 10/03/24 0252  CHOL 161  TRIG 84  HDL 42  LDLCALC 102*  CHOLHDL 3.8    Hematology Recent Labs  Lab 10/02/24 1231 10/02/24 1310 10/02/24 2104 10/03/24 0252  WBC 5.1  --  6.2 5.3  RBC 4.88  --  4.98 4.67  HGB 13.3 13.9 13.5 12.5*  HCT 41.6 41.0 42.4 39.8  MCV 85.2  --  85.1 85.2  MCH 27.3  --  27.1 26.8  MCHC 32.0  --  31.8 31.4  RDW 13.8  --  14.0 14.2  PLT 476*  --  513* 428*   Thyroid   Recent Labs  Lab 10/02/24 2104  TSH 3.564    BNP Recent Labs  Lab 10/02/24 1302  BNP 764.8*    DDimer No results for input(s): DDIMER in the last 168  hours.  Radiology/Studies:  CT Angio Chest PE W and/or Wo Contrast Result Date: 10/02/2024 CLINICAL DATA:  Pulmonary embolism suspected, high probability. EXAM: CT ANGIOGRAPHY CHEST WITH CONTRAST TECHNIQUE: Multidetector CT imaging of the chest was performed using the standard protocol during bolus administration of intravenous contrast. Multiplanar CT image reconstructions and MIPs were obtained to evaluate the vascular anatomy. RADIATION DOSE REDUCTION: This exam was performed according to the departmental dose-optimization program which includes automated exposure control, adjustment of the mA and/or kV according to patient size and/or use of iterative reconstruction technique. CONTRAST:  75mL OMNIPAQUE  IOHEXOL  350 MG/ML SOLN COMPARISON:  08/13/2018. FINDINGS: Cardiovascular: The heart is enlarged and there is a moderate pericardial effusion. Scattered coronary artery calcifications are noted. There is atherosclerotic calcification of the aorta without evidence of aneurysm. The pulmonary trunk is mildly distended suggesting underlying pulmonary artery hypertension. No evidence of pulmonary embolism is seen. Evaluation of the distal pulmonary arteries at the lung bases is limited due to suboptimal opacification. Mediastinum/Nodes: Prominent lymph nodes are present in the mediastinum and hilar regions bilaterally, likely reactive. No axillary lymphadenopathy is seen. The thyroid  gland, trachea, and esophagus are within normal limits. There is a small hiatal hernia with evidence of prior gastric surgery. Lungs/Pleura: There are small to moderate bilateral pleural effusions with patchy airspace disease in the bilateral lower lobes. A few scattered ground-glass opacities and subsegmental atelectasis are noted bilaterally. No pneumothorax. Upper Abdomen: Gastric surgery changes are noted. No acute abnormality. Musculoskeletal: Degenerative changes are present the thoracic spine. No acute osseous abnormality is  seen. Review of the MIP images confirms the above findings. IMPRESSION: 1. No evidence of pulmonary embolism. 2. Scattered ground-glass opacities bilaterally with patchy airspace disease at the lung bases, possible edema or infiltrate. 3. Small to moderate bilateral pleural effusions. 4. Cardiomegaly with moderate pericardial effusion and coronary artery calcifications. 5. Aortic atherosclerosis. Electronically Signed   By: Leita Birmingham M.D.   On: 10/02/2024 16:18   DG Chest 2 View Result Date: 10/02/2024 CLINICAL DATA:  Chest pain.  Shortness of breath. EXAM: CHEST - 2 VIEW COMPARISON:  Radiograph from 2018 FINDINGS: The heart is enlarged. Vascular congestion and findings of mild pulmonary edema. There are small pleural effusions, left greater than right, with associated basilar opacities. No pneumothorax. No acute osseous findings. IMPRESSION: Cardiomegaly with vascular congestion and mild pulmonary edema. Small pleural effusions, left greater than right. Electronically Signed   By: Andrea Gasman M.D.   On: 10/02/2024 14:05     Assessment and Plan:  1. Acute CHF (Echo pending to determine HFrEF vs. HFpEF) - Presented with worsening dyspnea on exertion, orthopnea, PND and lower extremity edema for the past week. BNP elevated at 764 and CXR consistent with CHF.  Echocardiogram pending. - While I&O's have not been recorded, he reports multiple urine occurrences overnight and significant improvement in symptoms. - He still  has some rales on examination today and will continue with IV Lasix 40 mg twice daily (creatinine stable at 1.52). Also on Lopressor 12.5 mg twice daily and Losartan  25 mg daily. Based off echocardiogram results, may require initiation of GDMT. Would start SGLT2i once determined he will not require additional procedures. If he is found to have a reduced EF, would plan for [facility] once able to lie flat for the procedure.  2. Elevated Troponin Values - He reports episodes of chest  pain when trying to catch his breath but no specific exertional chest pain. Hs Troponin values have been elevated at 223, 223, 217 and 186.  Echocardiogram is pending to assess for any structural abnormalities.  Suspect he will require cardiac catheterization as he is at high risk for an ischemic cardiomyopathy given his HTN, HLD, Type 2 DM, family history of CAD and coronary calcification by CT.  - At this time, would continue ASA 81 mg daily, Atorvastatin  20 mg daily and Lopressor 12.5 mg twice daily.  Was initially on IV Heparin  but discontinued by the hospitalist overnight.  This seems reasonable given no recurrent chest pain and flat enzyme trend.   3. Pericardial Effusion - This was moderate in size by CT. Echocardiogram pending for further assessment.  4. HTN - BP has been elevated, at 124/97 on most recent check. Currently on Lopressor 12.5 mg twice daily. On Losartan  prior to admission but this was not ordered. Will order a lower dose of 25 mg daily and can titrate as needed.  5. HLD - FLP shows total cholesterol at 161, HDL 42, LDL 102. He was on Atorvastatin  20 mg daily prior to admission and given coronary calcification by CT, will titrate to 40 mg daily. Repeat FLP and LFTs in 2 months.   Risk Assessment/Risk Scores:  [place]  Heart Association (NYHA) Functional Class NYHA Class IV  For questions or updates, please contact [facility] Please consult [email or web address] for contact info under   Signed, Laymon CHRISTELLA Qua, [name]  10/03/2024 9:20 AM

## 2024-10-03 NOTE — Progress Notes (Signed)
 PROGRESS NOTE    Raymond Ryan  FMW:[id number] DOB: Oct 28, 1966 DOA: 10/02/2024 PCP: [name] ORN, MD  Brief Narrative:  58 y.o. male with medical history significant of type 2 diabetes, hypertension came in with complaints of chest pressure pressure dyspnea on exertion and palpitations for the past 2 weeks.  Complains of PND and orthopnea.  Has a dry cough no pleuritic chest pain he feels there is pressure in his chest.  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  Never had similar symptoms in the past.  Has some nausea but no vomiting no fever chills abdominal pain urinary complaints.  He has been on tirzepatide  and has lost over 90 pounds.  He also had gastric bypass in the past.  He first went to urgent care and he was sent to the ED.  In the ED his high-sensitivity troponin was 223 x 2, D-dimer pending, BNP 764.8.  He was hypertensive tachycardic afebrile with normal saturation on room air.  CT of the chest no evidence of PE scattered ground glass opacities bilaterally with patchy airspace disease at the lung bases possible edema or infiltrate small to moderate bilateral pleural effusions cardiomegaly with moderate pericardial effusion and coronary artery calcifications and aortic atherosclerosis. He denied smoking use of alcohol or recreational drugs.  Assessment & Plan:   Principal Problem:   New onset of congestive heart failure (HCC)   #1 new onset CHF/elevated troponin-patient admitted with dyspnea on exertion orthopnea and PND with edema and weight gain palpitations.  Workup shows he has pleural effusion pericardial effusion moderate and elevated BNP and troponin.  Troponin 186 from 217 from 223 Continue Lasix 40 mg twice a day Continue metoprolol Possible cath prior to discharge n.p.o. after midnight monitor renal functions Echo pending  #2 history of hypertension poorly controlled continue Lasix Hold ACE inhibitor in light of diuresis and elevated  creatinine.   #3 type 2 diabetes A1c 6.1.on metformin  at home will hold and cover with SSI.   #4 obesity on tirzepatide  history of gastric bypass surgery.     #5 BPH on Flomax  continue  #6 hyperlipidemia LDL 102 atorvastatin  increased to 40 mg daily    Estimated body mass index is 30.03 kg/m as calculated from the following:   Height as of this encounter: 6' 4 (1.93 m).   Weight as of this encounter: 111.9 kg.  DVT prophylaxis: Lovenox  Code Status: Full code Family Communication: None Disposition Plan:  Status is: Inpatient Remains inpatient appropriate because: Acute illness   Consultants: Cardiology  Procedures: None none Antimicrobials: None  Subjective: Sitting up in chair feels better than yesterday has been urinating a lot overnight  Objective: Vitals:   10/03/24 0322 10/03/24 0720 10/03/24 1113 10/03/24 1601  BP: (!) 126/96 (!) 124/97 118/81 (!) 112/93  Pulse: 100 (!) 103 100 (!) 103  Resp: 16 20 20 20   Temp: 98 F (36.7 C) 98.1 F (36.7 C) 98.4 F (36.9 C) 98.2 F (36.8 C)  TempSrc: Oral Oral Oral Oral  SpO2: 95% 99% 100% 99%  Weight:      Height:        Intake/Output Summary (Last 24 hours) at 10/03/2024 1622 Last data filed at 10/03/2024 1356 Gross per 24 hour  Intake 594 ml  Output --  Net 594 ml   Filed Weights   10/02/24 1225 10/02/24 1949  Weight: 108.9 kg 111.9 kg    Examination:  General exam: Appears in nad Respiratory system: Clear  to auscultation. Respiratory effort normal. Cardiovascular system: reg Gastrointestinal system: Abdomen is nondistended, soft and nontender. No organomegaly or masses felt. Normal bowel sounds heard. Central nervous system: Alert and oriented. No focal neurological deficits. Extremities:no edema today    Data Reviewed: I have personally reviewed following labs and imaging studies  CBC: Recent Labs  Lab 10/02/24 1231 10/02/24 1310 10/02/24 2104 10/03/24 0252  WBC 5.1  --  6.2 5.3  HGB 13.3 13.9  13.5 12.5*  HCT 41.6 41.0 42.4 39.8  MCV 85.2  --  85.1 85.2  PLT 476*  --  513* 428*   Basic Metabolic Panel: Recent Labs  Lab 10/02/24 1231 10/02/24 1310 10/02/24 2104 10/03/24 0252  NA 142 144  --  144  K 3.6 3.5  --  3.9  CL 110 111  --  106  CO2 22  --   --  26  GLUCOSE 145* 144*  --  96  BUN 10 12  --  11  CREATININE 1.51* 1.60* 1.57* 1.52*  CALCIUM  8.8*  --   --  8.9   GFR: Estimated Creatinine Clearance: 73.4 mL/min (A) (by C-G formula based on SCr of 1.52 mg/dL (H)). Liver Function Tests: Recent Labs  Lab 10/03/24 0252  AST 13*  ALT 11  ALKPHOS 54  BILITOT 1.1  PROT 6.3*  ALBUMIN 3.1*   No results for input(s): LIPASE, AMYLASE in the last 168 hours. No results for input(s): AMMONIA in the last 168 hours. Coagulation Profile: No results for input(s): INR, PROTIME in the last 168 hours. Cardiac Enzymes: No results for input(s): CKTOTAL, CKMB, CKMBINDEX, TROPONINI in the last 168 hours. BNP (last 3 results) No results for input(s): PROBNP in the last 8760 hours. HbA1C: Recent Labs    10/02/24 2104  HGBA1C 6.1*   CBG: Recent Labs  Lab 10/02/24 2155 10/03/24 0558 10/03/24 1112 10/03/24 1600  GLUCAP 180* 93 177* 102*   Lipid Profile: Recent Labs    10/03/24 0252  CHOL 161  HDL 42  LDLCALC 102*  TRIG 84  CHOLHDL 3.8   Thyroid  Function Tests: Recent Labs    10/02/24 2104  TSH 3.564   Anemia Panel: No results for input(s): VITAMINB12, FOLATE, FERRITIN, TIBC, IRON, RETICCTPCT in the last 72 hours. Sepsis Labs: No results for input(s): PROCALCITON, LATICACIDVEN in the last 168 hours.  Recent Results (from the past 240 hours)  Resp panel by RT-PCR (RSV, Flu A&B, Covid) Anterior Nasal Swab     Status: None   Collection Time: 10/02/24 12:26 PM   Specimen: Anterior Nasal Swab  Result Value Ref Range Status   SARS Coronavirus 2 by RT PCR NEGATIVE NEGATIVE Final   Influenza A by PCR NEGATIVE NEGATIVE Final    Influenza B by PCR NEGATIVE NEGATIVE Final    Comment: (NOTE) The Xpert Xpress SARS-CoV-2/FLU/RSV plus assay is intended as an aid in the diagnosis of influenza from Nasopharyngeal swab specimens and should not be used as a sole basis for treatment. Nasal washings and aspirates are unacceptable for Xpert Xpress SARS-CoV-2/FLU/RSV testing.  Fact Sheet for Patients: BloggerCourse.com  Fact Sheet for Healthcare Providers: SeriousBroker.it  This test is not yet approved or cleared by the [place]  FDA and has been authorized for detection and/or diagnosis of SARS-CoV-2 by FDA under an Emergency Use Authorization (EUA). This EUA will remain in effect (meaning this test can be used) for the duration of the COVID-19 declaration under Section 564(b)(1) of the Act, 21 U.S.C. section 360bbb-3(b)(1), unless the  authorization is terminated or revoked.     Resp Syncytial Virus by PCR NEGATIVE NEGATIVE Final    Comment: (NOTE) Fact Sheet for Patients: BloggerCourse.com  Fact Sheet for Healthcare Providers: SeriousBroker.it  This test is not yet approved or cleared by the [place]  FDA and has been authorized for detection and/or diagnosis of SARS-CoV-2 by FDA under an Emergency Use Authorization (EUA). This EUA will remain in effect (meaning this test can be used) for the duration of the COVID-19 declaration under Section 564(b)(1) of the Act, 21 U.S.C. section 360bbb-3(b)(1), unless the authorization is terminated or revoked.  Performed at [facility], [address] [place], [place] [address]          Radiology Studies: CT Angio Chest PE W and/or Wo Contrast Result Date: 10/02/2024 CLINICAL DATA:  Pulmonary embolism suspected, high probability. EXAM: CT ANGIOGRAPHY CHEST WITH CONTRAST TECHNIQUE: Multidetector CT imaging of the chest was performed using the  standard protocol during bolus administration of intravenous contrast. Multiplanar CT image reconstructions and MIPs were obtained to evaluate the vascular anatomy. RADIATION DOSE REDUCTION: This exam was performed according to the departmental dose-optimization program which includes automated exposure control, adjustment of the mA and/or kV according to patient size and/or use of iterative reconstruction technique. CONTRAST:  75mL OMNIPAQUE  IOHEXOL  350 MG/ML SOLN COMPARISON:  08/13/2018. FINDINGS: Cardiovascular: The heart is enlarged and there is a moderate pericardial effusion. Scattered coronary artery calcifications are noted. There is atherosclerotic calcification of the aorta without evidence of aneurysm. The pulmonary trunk is mildly distended suggesting underlying pulmonary artery hypertension. No evidence of pulmonary embolism is seen. Evaluation of the distal pulmonary arteries at the lung bases is limited due to suboptimal opacification. Mediastinum/Nodes: Prominent lymph nodes are present in the mediastinum and hilar regions bilaterally, likely reactive. No axillary lymphadenopathy is seen. The thyroid  gland, trachea, and esophagus are within normal limits. There is a small hiatal hernia with evidence of prior gastric surgery. Lungs/Pleura: There are small to moderate bilateral pleural effusions with patchy airspace disease in the bilateral lower lobes. A few scattered ground-glass opacities and subsegmental atelectasis are noted bilaterally. No pneumothorax. Upper Abdomen: Gastric surgery changes are noted. No acute abnormality. Musculoskeletal: Degenerative changes are present the thoracic spine. No acute osseous abnormality is seen. Review of the MIP images confirms the above findings. IMPRESSION: 1. No evidence of pulmonary embolism. 2. Scattered ground-glass opacities bilaterally with patchy airspace disease at the lung bases, possible edema or infiltrate. 3. Small to moderate bilateral pleural  effusions. 4. Cardiomegaly with moderate pericardial effusion and coronary artery calcifications. 5. Aortic atherosclerosis. Electronically Signed   By: Leita Birmingham M.D.   On: 10/02/2024 16:18   DG Chest 2 View Result Date: 10/02/2024 CLINICAL DATA:  Chest pain.  Shortness of breath. EXAM: CHEST - 2 VIEW COMPARISON:  Radiograph from 2018 FINDINGS: The heart is enlarged. Vascular congestion and findings of mild pulmonary edema. There are small pleural effusions, left greater than right, with associated basilar opacities. No pneumothorax. No acute osseous findings. IMPRESSION: Cardiomegaly with vascular congestion and mild pulmonary edema. Small pleural effusions, left greater than right. Electronically Signed   By: Andrea Gasman M.D.   On: 10/02/2024 14:05        Scheduled Meds:  aspirin EC  81 mg Oral Daily   atorvastatin   40 mg Oral Daily   enoxaparin  (LOVENOX ) injection  40 mg Subcutaneous Q24H   furosemide  40 mg Intravenous BID   gabapentin   300 mg Oral BID  insulin  aspart  0-15 Units Subcutaneous TID WC   insulin  aspart  0-5 Units Subcutaneous QHS   losartan   25 mg Oral QPM   metoprolol tartrate  12.5 mg Oral BID   tamsulosin   0.4 mg Oral Daily   Continuous Infusions:   LOS: 1 day    Almarie KANDICE Hoots, MD  10/03/2024, 4:22 PM

## 2024-10-03 NOTE — Plan of Care (Signed)
  Problem: Metabolic: Goal: Ability to maintain appropriate glucose levels will improve Outcome: Progressing   Problem: Nutritional: Goal: Progress toward achieving an optimal weight will improve Outcome: Progressing   Problem: Education: Goal: Knowledge of General Education information will improve Description: Including pain rating scale, medication(s)/side effects and non-pharmacologic comfort measures Outcome: Progressing

## 2024-10-04 ENCOUNTER — Encounter (HOSPITAL_COMMUNITY)
Admission: EM | Disposition: A | Discharge status: Home / Self Care | Payer: Self-pay | Source: Home / Self Care | Attending: Internal Medicine

## 2024-10-04 DIAGNOSIS — I3139 Other pericardial effusion (noninflammatory): Secondary | ICD-10-CM

## 2024-10-04 DIAGNOSIS — J90 Pleural effusion, not elsewhere classified: Secondary | ICD-10-CM

## 2024-10-04 DIAGNOSIS — I5021 Acute systolic (congestive) heart failure: Secondary | ICD-10-CM | POA: Diagnosis not present

## 2024-10-04 DIAGNOSIS — I428 Other cardiomyopathies: Secondary | ICD-10-CM

## 2024-10-04 DIAGNOSIS — I270 Primary pulmonary hypertension: Secondary | ICD-10-CM

## 2024-10-04 DIAGNOSIS — I2489 Other forms of acute ischemic heart disease: Secondary | ICD-10-CM | POA: Diagnosis not present

## 2024-10-04 DIAGNOSIS — I509 Heart failure, unspecified: Secondary | ICD-10-CM | POA: Diagnosis not present

## 2024-10-04 HISTORY — PX: RIGHT/LEFT HEART CATH AND CORONARY ANGIOGRAPHY: CATH118266

## 2024-10-04 LAB — POCT I-STAT EG7
Acid-Base Excess: 2 mmol/L (ref 0.0–2.0)
Acid-base deficit: 3 mmol/L — ABNORMAL HIGH (ref 0.0–2.0)
Bicarbonate: 22.9 mmol/L (ref 20.0–28.0)
Bicarbonate: 27.1 mmol/L (ref 20.0–28.0)
Calcium, Ion: 1 mmol/L — ABNORMAL LOW (ref 1.15–1.40)
Calcium, Ion: 1.17 mmol/L (ref 1.15–1.40)
HCT: 39 % (ref 39.0–52.0)
HCT: 41 % (ref 39.0–52.0)
Hemoglobin: 13.3 g/dL (ref 13.0–17.0)
Hemoglobin: 13.9 g/dL (ref 13.0–17.0)
O2 Saturation: 65 %
O2 Saturation: 66 %
Potassium: 2.8 mmol/L — ABNORMAL LOW (ref 3.5–5.1)
Potassium: 3.2 mmol/L — ABNORMAL LOW (ref 3.5–5.1)
Sodium: 134 mmol/L — ABNORMAL LOW (ref 135–145)
Sodium: 143 mmol/L (ref 135–145)
TCO2: 24 mmol/L (ref 22–32)
TCO2: 28 mmol/L (ref 22–32)
pCO2, Ven: 41.8 mmHg — ABNORMAL LOW (ref 44–60)
pCO2, Ven: 42.5 mmHg — ABNORMAL LOW (ref 44–60)
pH, Ven: 7.339 (ref 7.25–7.43)
pH, Ven: 7.42 (ref 7.25–7.43)
pO2, Ven: 33 mmHg (ref 32–45)
pO2, Ven: 36 mmHg (ref 32–45)

## 2024-10-04 LAB — GLUCOSE, CAPILLARY
Glucose-Capillary: 110 mg/dL — ABNORMAL HIGH (ref 70–99)
Glucose-Capillary: 95 mg/dL (ref 70–99)
Glucose-Capillary: 93 mg/dL (ref 70–99)
Glucose-Capillary: 130 mg/dL — ABNORMAL HIGH (ref 70–99)

## 2024-10-04 LAB — CREATININE, SERUM
Creatinine, Ser: 1.6 mg/dL — ABNORMAL HIGH (ref 0.61–1.24)
GFR, Estimated: 50 mL/min — ABNORMAL LOW (ref >60)

## 2024-10-04 LAB — BASIC METABOLIC PANEL WITH GFR
Anion gap: 11 (ref 5–15)
BUN: 12 mg/dL (ref 6–20)
CO2: 28 mmol/L (ref 22–32)
Calcium: 9 mg/dL (ref 8.9–10.3)
Chloride: 104 mmol/L (ref 98–111)
Creatinine, Ser: 1.63 mg/dL — ABNORMAL HIGH (ref 0.61–1.24)
GFR, Estimated: 49 mL/min — ABNORMAL LOW (ref >60)
Glucose, Bld: 117 mg/dL — ABNORMAL HIGH (ref 70–99)
Potassium: 3.8 mmol/L (ref 3.5–5.1)
Sodium: 143 mmol/L (ref 135–145)

## 2024-10-04 LAB — CBC
HCT: 43.5 % (ref 39.0–52.0)
Hemoglobin: 13.8 g/dL (ref 13.0–17.0)
MCH: 27 pg (ref 26.0–34.0)
MCHC: 31.7 g/dL (ref 30.0–36.0)
MCV: 85.1 fL (ref 80.0–100.0)
Platelets: 467 K/uL — ABNORMAL HIGH (ref 150–400)
RBC: 5.11 MIL/uL (ref 4.22–5.81)
RDW: 13.9 % (ref 11.5–15.5)
WBC: 6.4 K/uL (ref 4.0–10.5)
nRBC: 0 % (ref 0.0–0.2)

## 2024-10-04 LAB — POCT I-STAT 7, (LYTES, BLD GAS, ICA,H+H)
Acid-Base Excess: 2 mmol/L (ref 0.0–2.0)
Bicarbonate: 25.6 mmol/L (ref 20.0–28.0)
Calcium, Ion: 1.12 mmol/L — ABNORMAL LOW (ref 1.15–1.40)
HCT: 40 % (ref 39.0–52.0)
Hemoglobin: 13.6 g/dL (ref 13.0–17.0)
O2 Saturation: 94 %
Potassium: 3.1 mmol/L — ABNORMAL LOW (ref 3.5–5.1)
Sodium: 143 mmol/L (ref 135–145)
TCO2: 27 mmol/L (ref 22–32)
pCO2 arterial: 37.2 mmHg (ref 32–48)
pH, Arterial: 7.445 (ref 7.35–7.45)
pO2, Arterial: 68 mmHg — ABNORMAL LOW (ref 83–108)

## 2024-10-04 SURGERY — RIGHT/LEFT HEART CATH AND CORONARY ANGIOGRAPHY
Anesthesia: LOCAL

## 2024-10-04 MED ORDER — FENTANYL CITRATE (PF) 100 MCG/2ML IJ SOLN
INTRAMUSCULAR | Status: DC | PRN
  Administered 2024-10-04: 25 ug via INTRAVENOUS

## 2024-10-04 MED ORDER — HEPARIN SODIUM (PORCINE) 1000 UNIT/ML IJ SOLN
INTRAMUSCULAR | Status: AC
  Filled 2024-10-04: qty 10

## 2024-10-04 MED ORDER — HEPARIN SODIUM (PORCINE) 1000 UNIT/ML IJ SOLN
INTRAMUSCULAR | Status: DC | PRN
  Administered 2024-10-04: 5000 [IU] via INTRAVENOUS

## 2024-10-04 MED ORDER — VERAPAMIL HCL 2.5 MG/ML IV SOLN
INTRAVENOUS | Status: DC | PRN
  Administered 2024-10-04: 10 mL via INTRA_ARTERIAL

## 2024-10-04 MED ORDER — SPIRONOLACTONE 12.5 MG HALF TABLET
12.5000 mg | ORAL_TABLET | Freq: Every day | ORAL | Status: DC
  Administered 2024-10-04 – 2024-10-06 (×3): 12.5 mg via ORAL
  Filled 2024-10-04 (×3): qty 1

## 2024-10-04 MED ORDER — POTASSIUM CHLORIDE CRYS ER 20 MEQ PO TBCR
40.0000 meq | EXTENDED_RELEASE_TABLET | Freq: Once | ORAL | Status: AC
  Administered 2024-10-04: 40 meq via ORAL
  Filled 2024-10-04: qty 2

## 2024-10-04 MED ORDER — CARVEDILOL 3.125 MG PO TABS
3.1250 mg | ORAL_TABLET | Freq: Two times a day (BID) | ORAL | Status: DC
  Administered 2024-10-04 – 2024-10-06 (×4): 3.125 mg via ORAL
  Filled 2024-10-04 (×4): qty 1

## 2024-10-04 MED ORDER — MIDAZOLAM HCL 2 MG/2ML IJ SOLN
INTRAMUSCULAR | Status: AC
  Filled 2024-10-04: qty 2

## 2024-10-04 MED ORDER — HEPARIN (PORCINE) IN NACL 1000-0.9 UT/500ML-% IV SOLN
INTRAVENOUS | Status: DC | PRN
  Administered 2024-10-04: 1000 mL via SURGICAL_CAVITY

## 2024-10-04 MED ORDER — MIDAZOLAM HCL 2 MG/2ML IJ SOLN
INTRAMUSCULAR | Status: DC | PRN
  Administered 2024-10-04: 1 mg via INTRAVENOUS

## 2024-10-04 MED ORDER — IOHEXOL 350 MG/ML SOLN
INTRAVENOUS | Status: DC | PRN
  Administered 2024-10-04: 25 mL via INTRA_ARTERIAL

## 2024-10-04 MED ORDER — LIDOCAINE HCL (PF) 1 % IJ SOLN
INTRAMUSCULAR | Status: DC | PRN
  Administered 2024-10-04 (×2): 5 mL

## 2024-10-04 MED ORDER — ASPIRIN 81 MG PO CHEW: 81.0000 mg | CHEWABLE_TABLET | ORAL | Status: DC

## 2024-10-04 MED ORDER — LIDOCAINE HCL (PF) 1 % IJ SOLN
INTRAMUSCULAR | Status: AC
  Filled 2024-10-04: qty 30

## 2024-10-04 MED ORDER — SACUBITRIL-VALSARTAN 24-26 MG PO TABS
1.0000 | ORAL_TABLET | Freq: Two times a day (BID) | ORAL | Status: DC
  Administered 2024-10-05 – 2024-10-06 (×3): 1 via ORAL
  Filled 2024-10-04 (×3): qty 1

## 2024-10-04 MED ORDER — FENTANYL CITRATE (PF) 100 MCG/2ML IJ SOLN
INTRAMUSCULAR | Status: AC
  Filled 2024-10-04: qty 2

## 2024-10-04 MED ORDER — FREE WATER: 250.0000 mL | Freq: Once | Status: DC

## 2024-10-04 MED ORDER — VERAPAMIL HCL 2.5 MG/ML IV SOLN
INTRAVENOUS | Status: AC
  Filled 2024-10-04: qty 2

## 2024-10-04 MED ORDER — FUROSEMIDE 10 MG/ML IJ SOLN
80.0000 mg | Freq: Two times a day (BID) | INTRAMUSCULAR | Status: DC
  Administered 2024-10-05 (×2): 80 mg via INTRAVENOUS
  Filled 2024-10-04 (×2): qty 8

## 2024-10-04 SURGICAL SUPPLY — 10 items
CATH 5FR JL3.5 JR4 ANG PIG MP (CATHETERS) IMPLANT
CATH BALLN WEDGE 5F 110CM (CATHETERS) IMPLANT
CATH INFINITI 5FR JL4 (CATHETERS) IMPLANT
COVER PRB 48X5XTLSCP FOLD TPE (BAG) IMPLANT
DEVICE RAD COMP TR BAND LRG (VASCULAR PRODUCTS) IMPLANT
GLIDESHEATH SLEND SS 6F .021 (SHEATH) IMPLANT
GUIDEWIRE INQWIRE 1.5J.035X260 (WIRE) IMPLANT
PACK CARDIAC CATHETERIZATION (CUSTOM PROCEDURE TRAY) ×2 IMPLANT
SET ATX-X65L (MISCELLANEOUS) IMPLANT
SHEATH GLIDE SLENDER 4/5FR (SHEATH) IMPLANT

## 2024-10-04 NOTE — Progress Notes (Addendum)
 PROGRESS NOTE    Raymond Ryan  FMW:[id number] DOB: [birth year] DOA: 10/02/2024 PCP: [name] ORN, MD  Brief Narrative:  58 y.o. male with medical history significant of type 2 diabetes, hypertension came in with complaints of chest pressure pressure dyspnea on exertion and palpitations for the past 2 weeks.  Complains of PND and orthopnea.  Has a dry cough no pleuritic chest pain he feels there is pressure in his chest.  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  Never had similar symptoms in the past.  Has some nausea but no vomiting no fever chills abdominal pain urinary complaints.  He has been on tirzepatide  and has lost over 90 pounds.  He also had gastric bypass in the past.  He first went to urgent care and he was sent to the ED.  In the ED his high-sensitivity troponin was 223 x 2, D-dimer pending, BNP 764.8.  He was hypertensive tachycardic afebrile with normal saturation on room air.  CT of the chest no evidence of PE scattered ground glass opacities bilaterally with patchy airspace disease at the lung bases possible edema or infiltrate small to moderate bilateral pleural effusions cardiomegaly with moderate pericardial effusion and coronary artery calcifications and aortic atherosclerosis. He denied smoking use of alcohol or recreational drugs.  Assessment & Plan:   Principal Problem:   New onset of congestive heart failure (HCC) Active Problems:   Acute systolic heart failure (HCC)   Demand ischemia (HCC)   Pleural effusion   Pericardial effusion   #1  New onset acute systolic and diastolic heart failure-patient admitted with dyspnea on exertion orthopnea and PND with edema and weight gain palpitations.Chest xray shows he has pleural effusion pericardial effusion moderate and elevated BNP and troponin.  Troponin 186 from 217 from 223 Continue Lasix 40 mg twice a day Echo-Left ventricular ejection fraction, by estimation, is 30 to 35%. Left ventricular  ejection fraction by PLAX is 34 %. The left ventricle has moderately decreased function. The left ventricle demonstrates regional wall motion abnormalities The left ventricular internal cavity  size was moderately dilated. Left ventricular diastolic parameters are consistent with Grade III diastolic dysfunction (restrictive).  Right ventricular systolic function is normal. The right ventricular  size is normal. Moderate pericardial effusion. The pericardial effusion is circumferential. There is no evidence of cardiac tamponade.  Addendum 10/04/2024 patient had a cardiac cath that shows decompensated nonischemic cardiomyopathy, pulmonary hypertension.  #2 history of hypertension poorly controlled as outpatient currently 110/80.  Continue Lasix 40 twice daily,-continue Lasix, losartan  25 mg daily   #3 type 2 diabetes A1c 6.1.on metformin  at home will hold and cover with SSI. CBG (last 3)  Recent Labs    10/03/24 1600 10/03/24 2138 10/04/24 0623  GLUCAP 102* 111* 110*     #4 obesity on tirzepatide  history of gastric bypass surgery.     #5 BPH on Flomax  continue  #6 hyperlipidemia LDL 102 atorvastatin  increased to 40 mg daily    Estimated body mass index is 30.03 kg/m as calculated from the following:   Height as of this encounter: 6' 4 (1.93 m).   Weight as of this encounter: 111.9 kg.  DVT prophylaxis: Lovenox  Code Status: Full code Family Communication: Family at bedside  disposition Plan:  Status is: Inpatient Remains inpatient appropriate because: Acute illness   Consultants: Cardiology  Procedures: None none Antimicrobials: None  Subjective: Sitting up in chair Slept last night in bed flat breathing is improved discussed  echo findings with him he is very anxious his cousin at bedside  Objective: Vitals:   10/03/24 1601 10/03/24 2023 10/04/24 0052 10/04/24 0400  BP: (!) 112/93 107/61 112/77 110/80  Pulse: (!) 103 96 97 99  Resp: 20 20 20 20   Temp: 98.2 F (36.8 C)  97.8 F (36.6 C) 97.7 F (36.5 C) 98.3 F (36.8 C)  TempSrc: Oral Oral Oral Oral  SpO2: 99% 95% 96% 98%  Weight:      Height:        Intake/Output Summary (Last 24 hours) at 10/04/2024 1032 Last data filed at 10/03/2024 1356 Gross per 24 hour  Intake 236 ml  Output --  Net 236 ml   Filed Weights   10/02/24 1225 10/02/24 1949  Weight: 108.9 kg 111.9 kg    Examination:  General exam: Appears in nad Respiratory system: Diminished at the bases respiratory effort normal. Cardiovascular system: reg Gastrointestinal system: Abdomen is nondistended, soft and nontender. No organomegaly or masses felt. Normal bowel sounds heard. Central nervous system: Alert and oriented. No focal neurological deficits. Extremities: Trace edema  Data Reviewed: I have personally reviewed following labs and imaging studies  CBC: Recent Labs  Lab 10/02/24 1231 10/02/24 1310 10/02/24 2104 10/03/24 0252  WBC 5.1  --  6.2 5.3  HGB 13.3 13.9 13.5 12.5*  HCT 41.6 41.0 42.4 39.8  MCV 85.2  --  85.1 85.2  PLT 476*  --  513* 428*   Basic Metabolic Panel: Recent Labs  Lab 10/02/24 1231 10/02/24 1310 10/02/24 2104 10/03/24 0252 10/04/24 0300  NA 142 144  --  144 143  K 3.6 3.5  --  3.9 3.8  CL 110 111  --  106 104  CO2 22  --   --  26 28  GLUCOSE 145* 144*  --  96 117*  BUN 10 12  --  11 12  CREATININE 1.51* 1.60* 1.57* 1.52* 1.63*  CALCIUM  8.8*  --   --  8.9 9.0   GFR: Estimated Creatinine Clearance: 68.5 mL/min (A) (by C-G formula based on SCr of 1.63 mg/dL (H)). Liver Function Tests: Recent Labs  Lab 10/03/24 0252  AST 13*  ALT 11  ALKPHOS 54  BILITOT 1.1  PROT 6.3*  ALBUMIN 3.1*   No results for input(s): LIPASE, AMYLASE in the last 168 hours. No results for input(s): AMMONIA in the last 168 hours. Coagulation Profile: No results for input(s): INR, PROTIME in the last 168 hours. Cardiac Enzymes: No results for input(s): CKTOTAL, CKMB, CKMBINDEX, TROPONINI  in the last 168 hours. BNP (last 3 results) No results for input(s): PROBNP in the last 8760 hours. HbA1C: Recent Labs    10/02/24 2104  HGBA1C 6.1*   CBG: Recent Labs  Lab 10/03/24 0558 10/03/24 1112 10/03/24 1600 10/03/24 2138 10/04/24 0623  GLUCAP 93 177* 102* 111* 110*   Lipid Profile: Recent Labs    10/03/24 0252  CHOL 161  HDL 42  LDLCALC 102*  TRIG 84  CHOLHDL 3.8   Thyroid  Function Tests: Recent Labs    10/02/24 2104  TSH 3.564   Anemia Panel: No results for input(s): VITAMINB12, FOLATE, FERRITIN, TIBC, IRON, RETICCTPCT in the last 72 hours. Sepsis Labs: No results for input(s): PROCALCITON, LATICACIDVEN in the last 168 hours.  Recent Results (from the past 240 hours)  Resp panel by RT-PCR (RSV, Flu A&B, Covid) Anterior Nasal Swab     Status: None   Collection Time: 10/02/24 12:26 PM   Specimen: Anterior  Nasal Swab  Result Value Ref Range Status   SARS Coronavirus 2 by RT PCR NEGATIVE NEGATIVE Final   Influenza A by PCR NEGATIVE NEGATIVE Final   Influenza B by PCR NEGATIVE NEGATIVE Final    Comment: (NOTE) The Xpert Xpress SARS-CoV-2/FLU/RSV plus assay is intended as an aid in the diagnosis of influenza from Nasopharyngeal swab specimens and should not be used as a sole basis for treatment. Nasal washings and aspirates are unacceptable for Xpert Xpress SARS-CoV-2/FLU/RSV testing.  Fact Sheet for Patients: BloggerCourse.com  Fact Sheet for Healthcare Providers: SeriousBroker.it  This test is not yet approved or cleared by the [place]  FDA and has been authorized for detection and/or diagnosis of SARS-CoV-2 by FDA under an Emergency Use Authorization (EUA). This EUA will remain in effect (meaning this test can be used) for the duration of the COVID-19 declaration under Section 564(b)(1) of the Act, 21 U.S.C. section 360bbb-3(b)(1), unless the authorization is terminated  or revoked.     Resp Syncytial Virus by PCR NEGATIVE NEGATIVE Final    Comment: (NOTE) Fact Sheet for Patients: BloggerCourse.com  Fact Sheet for Healthcare Providers: SeriousBroker.it  This test is not yet approved or cleared by the [place]  FDA and has been authorized for detection and/or diagnosis of SARS-CoV-2 by FDA under an Emergency Use Authorization (EUA). This EUA will remain in effect (meaning this test can be used) for the duration of the COVID-19 declaration under Section 564(b)(1) of the Act, 21 U.S.C. section 360bbb-3(b)(1), unless the authorization is terminated or revoked.  Performed at [facility], [address] [place], [place] [address]          Radiology Studies: ECHOCARDIOGRAM COMPLETE Result Date: 10/03/2024    ECHOCARDIOGRAM REPORT   Patient Name:   Raymond Ryan Date of Exam: 10/03/2024 Medical Rec #:  [id number]          Height:       76.0 in Accession #:    [id number]         Weight:       246.7 lb Date of Birth:  01-18-1966           BSA:          2.422 m Patient Age:    58 years           BP:           118/81 mmHg Patient Gender: M                  HR:           104 bpm. Exam Location:  Inpatient Procedure: 2D Echo, Cardiac Doppler, Color Doppler and Intracardiac            Opacification Agent (Both Spectral and Color Flow Doppler were            utilized during procedure). Indications:    Abnormal EKG R94.31  History:        Patient has no prior history of Echocardiogram examinations.                 Risk Factors:Hypertension and Diabetes. H/O Hyperlipidemia.                 Chronic kidney disease.  Sonographer:    BERNARDA ROCKS Referring Phys: [id number] Maeghan Canny G Lindsey Hommel IMPRESSIONS  1. Left ventricular ejection fraction, by estimation, is 30 to 35%. Left ventricular ejection fraction by PLAX is 34 %. The left ventricle has moderately decreased  function. The left ventricle demonstrates  regional wall motion abnormalities (see scoring  diagram/findings for description). The left ventricular internal cavity size was moderately dilated. Left ventricular diastolic parameters are consistent with Grade III diastolic dysfunction (restrictive).  2. Right ventricular systolic function is normal. The right ventricular size is normal.  3. Moderate pericardial effusion. The pericardial effusion is circumferential. There is no evidence of cardiac tamponade.  4. The mitral valve is normal in structure. Mild to moderate mitral valve regurgitation. No evidence of mitral stenosis.  5. The aortic valve is normal in structure. Aortic valve regurgitation is not visualized. No aortic stenosis is present.  6. The inferior vena cava is normal in size with <50% respiratory variability, suggesting right atrial pressure of 8 mmHg. Comparison(s): No prior Echocardiogram. Conclusion(s)/Recommendation(s): No left ventricular mural or apical thrombus/thrombi. FINDINGS  Left Ventricle: Left ventricular ejection fraction, by estimation, is 30 to 35%. Left ventricular ejection fraction by PLAX is 34 %. The left ventricle has moderately decreased function. The left ventricle demonstrates regional wall motion abnormalities. Definity contrast agent was given IV to delineate the left ventricular endocardial borders. The left ventricular internal cavity size was moderately dilated. There is no left ventricular hypertrophy. Left ventricular diastolic parameters are consistent with Grade III diastolic dysfunction (restrictive).  LV Wall Scoring: The entire septum and apex are hypokinetic. Right Ventricle: The right ventricular size is normal. No increase in right ventricular wall thickness. Right ventricular systolic function is normal. Left Atrium: Left atrial size was normal in size. Right Atrium: Right atrial size was normal in size. Pericardium: A moderately sized pericardial effusion is present. The pericardial effusion is  circumferential. There is excessive respiratory variation in the mitral valve spectral Doppler velocities. There is no evidence of cardiac tamponade. Mitral Valve: The mitral valve is normal in structure. Mild mitral annular calcification. Mild to moderate mitral valve regurgitation. No evidence of mitral valve stenosis. MV peak gradient, 6.5 mmHg. The mean mitral valve gradient is 3.0 mmHg. Tricuspid Valve: The tricuspid valve is normal in structure. Tricuspid valve regurgitation is not demonstrated. No evidence of tricuspid stenosis. Aortic Valve: The aortic valve is normal in structure. There is mild aortic valve annular calcification. Aortic valve regurgitation is not visualized. No aortic stenosis is present. Aortic valve mean gradient measures 2.0 mmHg. Aortic valve peak gradient  measures 4.4 mmHg. Aortic valve area, by VTI measures 2.42 cm. Pulmonic Valve: The pulmonic valve was normal in structure. Pulmonic valve regurgitation is not visualized. No evidence of pulmonic stenosis. Aorta: The aortic root is normal in size and structure. Venous: The inferior vena cava is normal in size with less than 50% respiratory variability, suggesting right atrial pressure of 8 mmHg. IAS/Shunts: No atrial level shunt detected by color flow Doppler.  LEFT VENTRICLE PLAX 2D LV EF:         Left            Diastology                ventricular     LV e' medial:    4.35 cm/s                ejection        LV E/e' medial:  27.6                fraction by     LV e' lateral:   11.50 cm/s                PLAX  is 34      LV E/e' lateral: 10.4                %. LVIDd:         6.00 cm LVIDs:         5.00 cm LV PW:         0.90 cm LV IVS:        1.00 cm LVOT diam:     2.20 cm LV SV:         42 LV SV Index:   17 LVOT Area:     3.80 cm  LV Volumes (MOD) LV vol d, MOD    195.0 ml A2C: LV vol d, MOD    184.0 ml A4C: LV vol s, MOD    118.0 ml A2C: LV vol s, MOD    115.0 ml A4C: LV SV MOD A2C:   77.0 ml LV SV MOD A4C:   184.0 ml LV SV MOD  BP:    65.9 ml RIGHT VENTRICLE RV Basal diam:  3.40 cm RV S prime:     11.20 cm/s TAPSE (M-mode): 1.4 cm LEFT ATRIUM             Index        RIGHT ATRIUM           Index LA diam:        4.50 cm 1.86 cm/m   RA Pressure: 8.00 mmHg LA Vol (A2C):   83.9 ml 34.64 ml/m  RA Area:     13.80 cm LA Vol (A4C):   63.8 ml 26.34 ml/m  RA Volume:   30.00 ml  12.39 ml/m LA Biplane Vol: 74.1 ml 30.60 ml/m  AORTIC VALVE                    PULMONIC VALVE AV Area (Vmax):    2.52 cm     PV Vmax:       0.64 m/s AV Area (Vmean):   2.44 cm     PV Peak grad:  1.6 mmHg AV Area (VTI):     2.42 cm AV Vmax:           105.00 cm/s AV Vmean:          68.400 cm/s AV VTI:            0.173 m AV Peak Grad:      4.4 mmHg AV Mean Grad:      2.0 mmHg LVOT Vmax:         69.50 cm/s LVOT Vmean:        43.900 cm/s LVOT VTI:          0.110 m LVOT/AV VTI ratio: 0.64  AORTA Ao Root diam: 3.10 cm Ao Asc diam:  3.10 cm MITRAL VALVE                TRICUSPID VALVE MV Area (PHT): 7.29 cm     Estimated RAP:  8.00 mmHg MV Area VTI:   1.87 cm MV Peak grad:  6.5 mmHg     SHUNTS MV Mean grad:  3.0 mmHg     Systemic VTI:  0.11 m MV Vmax:       1.27 m/s     Systemic Diam: 2.20 cm MV Vmean:      82.3 cm/s MV Decel Time: 104 msec MR Peak grad: 46.0 mmHg MR Mean grad: 86.0 mmHg MR Vmax:      339.00 cm/s MV E  velocity: 120.00 cm/s MV A velocity: 40.20 cm/s MV E/A ratio:  2.99 Emeline Calender Electronically signed by Emeline Calender Signature Date/Time: 10/03/2024/6:09:56 PM    Final    CT Angio Chest PE W and/or Wo Contrast Result Date: 10/02/2024 CLINICAL DATA:  Pulmonary embolism suspected, high probability. EXAM: CT ANGIOGRAPHY CHEST WITH CONTRAST TECHNIQUE: Multidetector CT imaging of the chest was performed using the standard protocol during bolus administration of intravenous contrast. Multiplanar CT image reconstructions and MIPs were obtained to evaluate the vascular anatomy. RADIATION DOSE REDUCTION: This exam was performed according to the departmental  dose-optimization program which includes automated exposure control, adjustment of the mA and/or kV according to patient size and/or use of iterative reconstruction technique. CONTRAST:  75mL OMNIPAQUE  IOHEXOL  350 MG/ML SOLN COMPARISON:  08/13/2018. FINDINGS: Cardiovascular: The heart is enlarged and there is a moderate pericardial effusion. Scattered coronary artery calcifications are noted. There is atherosclerotic calcification of the aorta without evidence of aneurysm. The pulmonary trunk is mildly distended suggesting underlying pulmonary artery hypertension. No evidence of pulmonary embolism is seen. Evaluation of the distal pulmonary arteries at the lung bases is limited due to suboptimal opacification. Mediastinum/Nodes: Prominent lymph nodes are present in the mediastinum and hilar regions bilaterally, likely reactive. No axillary lymphadenopathy is seen. The thyroid  gland, trachea, and esophagus are within normal limits. There is a small hiatal hernia with evidence of prior gastric surgery. Lungs/Pleura: There are small to moderate bilateral pleural effusions with patchy airspace disease in the bilateral lower lobes. A few scattered ground-glass opacities and subsegmental atelectasis are noted bilaterally. No pneumothorax. Upper Abdomen: Gastric surgery changes are noted. No acute abnormality. Musculoskeletal: Degenerative changes are present the thoracic spine. No acute osseous abnormality is seen. Review of the MIP images confirms the above findings. IMPRESSION: 1. No evidence of pulmonary embolism. 2. Scattered ground-glass opacities bilaterally with patchy airspace disease at the lung bases, possible edema or infiltrate. 3. Small to moderate bilateral pleural effusions. 4. Cardiomegaly with moderate pericardial effusion and coronary artery calcifications. 5. Aortic atherosclerosis. Electronically Signed   By: Leita Birmingham M.D.   On: 10/02/2024 16:18   DG Chest 2 View Result Date:  10/02/2024 CLINICAL DATA:  Chest pain.  Shortness of breath. EXAM: CHEST - 2 VIEW COMPARISON:  Radiograph from 2018 FINDINGS: The heart is enlarged. Vascular congestion and findings of mild pulmonary edema. There are small pleural effusions, left greater than right, with associated basilar opacities. No pneumothorax. No acute osseous findings. IMPRESSION: Cardiomegaly with vascular congestion and mild pulmonary edema. Small pleural effusions, left greater than right. Electronically Signed   By: Andrea Gasman M.D.   On: 10/02/2024 14:05        Scheduled Meds:  [START ON 10/05/2024] aspirin  81 mg Oral Pre-Cath   aspirin EC  81 mg Oral Daily   atorvastatin   40 mg Oral Daily   enoxaparin  (LOVENOX ) injection  40 mg Subcutaneous Q24H   furosemide  40 mg Intravenous BID   gabapentin   300 mg Oral BID   insulin  aspart  0-15 Units Subcutaneous TID WC   insulin  aspart  0-5 Units Subcutaneous QHS   losartan   25 mg Oral QPM   tamsulosin   0.4 mg Oral Daily   Continuous Infusions:   LOS: 2 days    Almarie KANDICE Hoots, MD  10/04/2024, 10:32 AM

## 2024-10-04 NOTE — Progress Notes (Signed)
  Progress Note  Patient Name: Raymond Ryan Date of Encounter: 10/04/2024 [facility] Cardiologist: Darryle ONEIDA Decent, MD   Interval Summary   Reported his breathing has been improving, no longer short of breath at rest. Much improvement in his peripheral edema.   Vital Signs Vitals:   10/03/24 1601 10/03/24 2023 10/04/24 0052 10/04/24 0400  BP: (!) 112/93 107/61 112/77 110/80  Pulse: (!) 103 96 97 99  Resp: 20 20 20 20   Temp: 98.2 F (36.8 C) 97.8 F (36.6 C) 97.7 F (36.5 C) 98.3 F (36.8 C)  TempSrc: Oral Oral Oral Oral  SpO2: 99% 95% 96% 98%  Weight:      Height:        Intake/Output Summary (Last 24 hours) at 10/04/2024 0841 Last data filed at 10/03/2024 1356 Gross per 24 hour  Intake 354 ml  Output --  Net 354 ml      10/02/2024    7:49 PM 10/02/2024   12:25 PM 06/16/2024   10:31 AM  Last 3 Weights  Weight (lbs) 246 lb 11.1 oz 240 lb 250 lb 3.2 oz  Weight (kg) 111.9 kg 108.863 kg 113.49 kg      Telemetry/ECG  Sinus rhythm with PVCs, HR ~ 95 - Personally Reviewed  Physical Exam  GEN: No acute distress.   Cardiac: RRR, no murmurs, rubs, or gallops.  Respiratory: Diminished breath sounds bilaterally GI: Soft, nontender, non-distended, umbilical hernia  MS: 1+ pitting edema  Assessment & Plan  Raymond Ryan is a 58 y.o. male with a hx of HTN, HLD, Type II DM, GERD and history of gastric bypass  who presented to the ED on 10/4 for shortness of breath and cough. BNP 764. Hs troponin 223, 223, 217 and 186. Imaging concerning for volume overload. Received IV diuresis, started on IV heparin , and cardiology was consulted.   Acute systolic and diastolic CHF  Demand Ischemia CKD  Echo: LVEF 30-35% with RWMA [akinetic septum and apex]. LV moderately dilated. G3 DD.  He has been receiving IV diuresis,Net IO Since Admission: 594 mL [10/04/24 0841]. Patient reports good UOP which has not been charted. Order placed for I/Os Cr baseline unclear with  limited data this year though appears to be ~ 1.4/1.5 On exam appears volume up  Given new HF and RWMA patient needs cardiac catheterization for further evaluation. Patient to remain NPO until after procedure.  Continue IV lasix 40 mg BID Continue losartan  25 mg for now, will transition to entresto after cath  Stop lopressor for now as patient presented with new decompensated HFrEF  Chest pain Patient reported chest pain with shortness of breath, described as a heaviness to his chest.  Coronary calcifications per CT Chest Pursuing cath as described above.  Continue ASA 81 mg  Pericardial Effusion Echo this admission shows moderate effusion with no evidence of cardiac tamponade.  Most likely 2/2 to volume overload.   Mitral Regurgitation Echo this admission shows mild to moderate mitral regurgitation. Will follow-up once patient is more compensated.  Hypertension BP: 110/80 GDMT as above.   Hyperlipidemia  LDL 102  HDL 42 Continue lipitor 40 mg, increased this admission will need follow up labwork  Per primary  T2DM GERD BPH   For questions or updates, please contact  [facility] Please consult [email or web address] for contact info under       Signed, Leontine LOISE Salen, [name]

## 2024-10-04 NOTE — Interval H&P Note (Signed)
 History and Physical Interval Note:  10/04/2024 3:45 PM  Raymond Ryan  has presented today for surgery, with the diagnosis of nstemi.  The various methods of treatment have been discussed with the patient and family. After consideration of risks, benefits and other options for treatment, the patient has consented to  Procedure(s): RIGHT/LEFT HEART CATH AND CORONARY ANGIOGRAPHY (N/A) as a surgical intervention.  The patient's history has been reviewed, patient examined, no change in status, stable for surgery.  I have reviewed the patient's chart and labs.  Questions were answered to the patient's satisfaction.     Olga Seyler J Siona Coulston

## 2024-10-04 NOTE — H&P (View-Only) (Signed)
  Progress Note  Patient Name: Raymond Ryan Date of Encounter: 10/04/2024 [facility] Cardiologist: Darryle ONEIDA Decent, MD   Interval Summary   Reported his breathing has been improving, no longer short of breath at rest. Much improvement in his peripheral edema.   Vital Signs Vitals:   10/03/24 1601 10/03/24 2023 10/04/24 0052 10/04/24 0400  BP: (!) 112/93 107/61 112/77 110/80  Pulse: (!) 103 96 97 99  Resp: 20 20 20 20   Temp: 98.2 F (36.8 C) 97.8 F (36.6 C) 97.7 F (36.5 C) 98.3 F (36.8 C)  TempSrc: Oral Oral Oral Oral  SpO2: 99% 95% 96% 98%  Weight:      Height:        Intake/Output Summary (Last 24 hours) at 10/04/2024 0841 Last data filed at 10/03/2024 1356 Gross per 24 hour  Intake 354 ml  Output --  Net 354 ml      10/02/2024    7:49 PM 10/02/2024   12:25 PM 06/16/2024   10:31 AM  Last 3 Weights  Weight (lbs) 246 lb 11.1 oz 240 lb 250 lb 3.2 oz  Weight (kg) 111.9 kg 108.863 kg 113.49 kg      Telemetry/ECG  Sinus rhythm with PVCs, HR ~ 95 - Personally Reviewed  Physical Exam  GEN: No acute distress.   Cardiac: RRR, no murmurs, rubs, or gallops.  Respiratory: Diminished breath sounds bilaterally GI: Soft, nontender, non-distended, umbilical hernia  MS: 1+ pitting edema  Assessment & Plan  Raymond Ryan is a 58 y.o. male with a hx of HTN, HLD, Type II DM, GERD and history of gastric bypass  who presented to the ED on 10/4 for shortness of breath and cough. BNP 764. Hs troponin 223, 223, 217 and 186. Imaging concerning for volume overload. Received IV diuresis, started on IV heparin , and cardiology was consulted.   Acute systolic and diastolic CHF  Demand Ischemia CKD  Echo: LVEF 30-35% with RWMA [akinetic septum and apex]. LV moderately dilated. G3 DD.  He has been receiving IV diuresis,Net IO Since Admission: 594 mL [10/04/24 0841]. Patient reports good UOP which has not been charted. Order placed for I/Os Cr baseline unclear with  limited data this year though appears to be ~ 1.4/1.5 On exam appears volume up  Given new HF and RWMA patient needs cardiac catheterization for further evaluation. Patient to remain NPO until after procedure.  Continue IV lasix 40 mg BID Continue losartan  25 mg for now, will transition to entresto after cath  Stop lopressor for now as patient presented with new decompensated HFrEF  Chest pain Patient reported chest pain with shortness of breath, described as a heaviness to his chest.  Coronary calcifications per CT Chest Pursuing cath as described above.  Continue ASA 81 mg  Pericardial Effusion Echo this admission shows moderate effusion with no evidence of cardiac tamponade.  Most likely 2/2 to volume overload.   Mitral Regurgitation Echo this admission shows mild to moderate mitral regurgitation. Will follow-up once patient is more compensated.  Hypertension BP: 110/80 GDMT as above.   Hyperlipidemia  LDL 102  HDL 42 Continue lipitor 40 mg, increased this admission will need follow up labwork  Per primary  T2DM GERD BPH   For questions or updates, please contact  [facility] Please consult [email or web address] for contact info under       Signed, Leontine LOISE Salen, [name]

## 2024-10-05 ENCOUNTER — Inpatient Hospital Stay (HOSPITAL_COMMUNITY)

## 2024-10-05 ENCOUNTER — Encounter (HOSPITAL_COMMUNITY): Payer: Self-pay | Admitting: Radiology

## 2024-10-05 DIAGNOSIS — I5021 Acute systolic (congestive) heart failure: Secondary | ICD-10-CM | POA: Diagnosis not present

## 2024-10-05 DIAGNOSIS — I340 Nonrheumatic mitral (valve) insufficiency: Secondary | ICD-10-CM | POA: Diagnosis not present

## 2024-10-05 DIAGNOSIS — I509 Heart failure, unspecified: Secondary | ICD-10-CM | POA: Diagnosis not present

## 2024-10-05 DIAGNOSIS — I2489 Other forms of acute ischemic heart disease: Secondary | ICD-10-CM | POA: Diagnosis not present

## 2024-10-05 LAB — BASIC METABOLIC PANEL WITH GFR
Anion gap: 10 (ref 5–15)
BUN: 13 mg/dL (ref 6–20)
CO2: 28 mmol/L (ref 22–32)
Calcium: 8.9 mg/dL (ref 8.9–10.3)
Chloride: 104 mmol/L (ref 98–111)
Creatinine, Ser: 1.56 mg/dL — ABNORMAL HIGH (ref 0.61–1.24)
GFR, Estimated: 51 mL/min — ABNORMAL LOW (ref >60)
Glucose, Bld: 104 mg/dL — ABNORMAL HIGH (ref 70–99)
Potassium: 4.1 mmol/L (ref 3.5–5.1)
Sodium: 142 mmol/L (ref 135–145)

## 2024-10-05 LAB — GLUCOSE, CAPILLARY
Glucose-Capillary: 180 mg/dL — ABNORMAL HIGH (ref 70–99)
Glucose-Capillary: 115 mg/dL — ABNORMAL HIGH (ref 70–99)
Glucose-Capillary: 103 mg/dL — ABNORMAL HIGH (ref 70–99)
Glucose-Capillary: 130 mg/dL — ABNORMAL HIGH (ref 70–99)

## 2024-10-05 MED ORDER — FREE WATER
250.0000 mL | Freq: Once | Status: AC
  Administered 2024-10-05: 250 mL via ORAL

## 2024-10-05 MED ORDER — GADOBUTROL 1 MMOL/ML IV SOLN
10.0000 mL | Freq: Once | INTRAVENOUS | Status: AC | PRN
  Administered 2024-10-05: 10 mL via INTRAVENOUS

## 2024-10-05 MED ORDER — ONDANSETRON HCL 4 MG/2ML IJ SOLN: 4.0000 mg | Freq: Four times a day (QID) | INTRAMUSCULAR | Status: DC | PRN

## 2024-10-05 MED ORDER — LABETALOL HCL 5 MG/ML IV SOLN: 10.0000 mg | INTRAVENOUS | Status: AC | PRN

## 2024-10-05 MED ORDER — SODIUM CHLORIDE 0.9% FLUSH: 3.0000 mL | INTRAVENOUS | Status: DC | PRN

## 2024-10-05 MED ORDER — EMPAGLIFLOZIN 10 MG PO TABS
10.0000 mg | ORAL_TABLET | Freq: Every day | ORAL | Status: DC
Start: 2024-10-05 — End: 2024-10-06
  Administered 2024-10-05 – 2024-10-06 (×2): 10 mg via ORAL
  Filled 2024-10-05 (×2): qty 1

## 2024-10-05 MED ORDER — HEPARIN SODIUM (PORCINE) 5000 UNIT/ML IJ SOLN: 5000.0000 [IU] | Freq: Three times a day (TID) | INTRAMUSCULAR | Status: DC

## 2024-10-05 MED ORDER — SODIUM CHLORIDE 0.9 % IV SOLN: 250.0000 mL | INTRAVENOUS | Status: AC | PRN

## 2024-10-05 MED ORDER — ACETAMINOPHEN 325 MG PO TABS: 650.0000 mg | ORAL_TABLET | ORAL | Status: DC | PRN

## 2024-10-05 MED ORDER — SODIUM CHLORIDE 0.9% FLUSH
3.0000 mL | Freq: Two times a day (BID) | INTRAVENOUS | Status: DC
  Administered 2024-10-05 (×2): 3 mL via INTRAVENOUS

## 2024-10-05 MED ORDER — HYDRALAZINE HCL 20 MG/ML IJ SOLN: 10.0000 mg | INTRAMUSCULAR | Status: AC | PRN

## 2024-10-05 NOTE — Progress Notes (Signed)
 Heart Failure Nurse Navigator Progress Note  PCP: [name] ORN, MD PCP-Cardiologist: None Admission Diagnosis: Shortness of breath, Tachycardia.  Admitted from: D'c from Urgent Care to ED  Presentation:   Raymond Ryan presented with shortness of breath and a cough for 1 week. Reports some palpitations in his chest.and unable to lay flat in bed and some BLE edema. ECG with Tachycardia, BP 138/105, HR 127, BNP 764, Trop 223, Creat 1.52. CXR showed cardiomegaly and vascular congestion with mild pulmonary edema and small pleural effusions. CTA showed no evidence of a PE but was noted to have scattered, ground glass opacities bilaterally which may represent possible edema or infiltrate along with small to moderate bilateral pleural effusions and cardiomegaly with a moderate pericardial effusion and coronary artery calcification. R/LHC- persistent elevated pressures and no significant obstructive coronary disease . Cardiac MRI with LGE, possible cardiac sarcoidosis but does not have extracardiac sarcoidosis, no adenopathy on CTA chest . Was recommend cardiac PET scan and genetic testing as outpatient .   Patient was educated on the sign and symptoms of heart failure, daily weights, when to call his doctor or go to the ED. Diet/ fluid restrictions, taking all his medications as prescribed and attending all medical appointments. Patient verbalized his understanding of education. A HF TOC appointment was scheduled for 10/18/2024 @ 8:15 am.   ECHO/ LVEF: 30-35%  Clinical Course:  Past Medical History:  Diagnosis Date   Charcot foot due to diabetes mellitus (HCC)    Constipation    sometimes soft, some hard -- does go daily - are on fiber    Diabetes mellitus    GERD (gastroesophageal reflux disease)    Headache    Hyperlipidemia    Hypertension    Neuromuscular disorder (HCC)    neuropathy     Social History   Socioeconomic History   Marital status: Single    Spouse name: Not on file    Number of children: 1   Years of education: 12   Highest education level: Not on file  Occupational History   Not on file  Tobacco Use   Smoking status: Never   Smokeless tobacco: Never  Vaping Use   Vaping status: Never Used  Substance and Sexual Activity   Alcohol use: No   Drug use: No   Sexual activity: Not on file  Other Topics Concern   Not on file  Social History Narrative   Not on file   Social Drivers of Health   Financial Resource Strain: Not on file  Food Insecurity: No Food Insecurity (10/02/2024)   Hunger Vital Sign    Worried About Running Out of Food in the Last Year: Never true    Ran Out of Food in the Last Year: Never true  Transportation Needs: No Transportation Needs (10/02/2024)   PRAPARE - Administrator, Civil Service (Medical): No    Lack of Transportation (Non-Medical): No  Physical Activity: Not on file  Stress: Not on file  Social Connections: Not on file   Education Assessment and Provision:  Detailed education and instructions provided on heart failure disease management including the following:  Signs and symptoms of Heart Failure When to call the physician Importance of daily weights Low sodium diet Fluid restriction Medication management Anticipated future follow-up appointments  Patient education given on each of the above topics.  Patient acknowledges understanding via teach back method and acceptance of all instructions.  Education Materials:  Living Better With Heart Failure Booklet,  HF zone tool, & Daily Weight Tracker Tool.  Patient has scale at home: Yes Patient has pill box at home: Yes    High Risk Criteria for Readmission and/or Poor Patient Outcomes: Heart failure hospital admissions (last 6 months): 1  No Show rate: 8 % Difficult social situation: Lives alone Demonstrates medication adherence: Yes Primary Language: English  Literacy level: Reading, writing,a nd comprehension   Barriers of Care:    New medical diagnosis Diet/ fluid restrictions/ daily weights  Considerations/Referrals:   Referral made to Heart Failure Pharmacist Stewardship: NA Referral made to Heart Failure CSW/NCM TOC: NA Referral made to Heart & Vascular TOC clinic: Yes, 10/18/2024 @ 8:15 am.   Items for Follow-up on DC/TOC: Continued HF education Diet/ fluid restrictions/ daily weights   Stephane Haddock, BSN, RN Heart Failure Teacher, adult education Only

## 2024-10-05 NOTE — Progress Notes (Signed)
 Cardiology Progress Note  Patient ID: Raymond Ryan MRN: [id number] DOB: [birth year] Date of Encounter: 10/05/2024 [facility] Cardiologist: Darryle ONEIDA Decent, MD  Subjective   Chief Complaint: None.   HPI: Still with volume up.  Further diuresis today.  Denies chest pain or trouble breathing.  ROS:  All other ROS reviewed and negative. Pertinent positives noted in the HPI.     Telemetry  Overnight telemetry shows sinus tachycardia heart rate low 100s, which I personally reviewed.     Physical Exam   Vitals:   10/04/24 2300 10/05/24 0400 10/05/24 0500 10/05/24 0829  BP: 125/86 112/76  112/76  Pulse: (P) 96 96  92  Resp: 19 18    Temp: 99.1 F (37.3 C) 98.6 F (37 C)    TempSrc: Oral Oral    SpO2: 98% 96%    Weight:   108.8 kg   Height:        Intake/Output Summary (Last 24 hours) at 10/05/2024 0935 Last data filed at 10/05/2024 0135 Gross per 24 hour  Intake 253 ml  Output --  Net 253 ml       10/05/2024    5:00 AM 10/04/2024   11:44 AM 10/02/2024    7:49 PM  Last 3 Weights  Weight (lbs) 239 lb 13.8 oz 236 lb 15.9 oz 246 lb 11.1 oz  Weight (kg) 108.8 kg 107.5 kg 111.9 kg    Body mass index is 29.2 kg/m.  General: Well nourished, well developed, in no acute distress Head: Atraumatic, normal size  Eyes: PEERLA, EOMI  Neck: Supple, no JVD Endocrine: No thryomegaly Cardiac: Normal S1, S2; RRR; no murmurs, rubs, or gallops Lungs: Clear to auscultation bilaterally, no wheezing, rhonchi or rales  Abd: Soft, nontender, no hepatomegaly  Ext: 1+ pitting edema Musculoskeletal: No deformities, BUE and BLE strength normal and equal Skin: Warm and dry, no rashes   Neuro: Alert and oriented to person, place, time, and situation, CNII-XII grossly intact, no focal deficits  Psych: Normal mood and affect   Cardiac Studies  LHC/RHC 10/04/2024 Coronary angiography 10/04/2024: LM: Normal LAD: Normal. No significant disease. Ramus: Normal. No significant disease. Lcx: Large,  dominant, no significant disease. RCA: Nondominant, no significant disease.   LVEDP 31 mmHg   Right heart catheterization 10/04/2024: RA: 9 mmHg RV: 59/7 mmHg PA: 58/28 mmHg, mPAP 42 mmHg PCW: 21 mmHg  TTE 10/03/2024  1. Left ventricular ejection fraction, by estimation, is 30 to 35%. Left  ventricular ejection fraction by PLAX is 34 %. The left ventricle has  moderately decreased function. The left ventricle demonstrates regional  wall motion abnormalities (see scoring   diagram/findings for description). The left ventricular internal cavity  size was moderately dilated. Left ventricular diastolic parameters are  consistent with Grade III diastolic dysfunction (restrictive).   2. Right ventricular systolic function is normal. The right ventricular  size is normal.   3. Moderate pericardial effusion. The pericardial effusion is  circumferential. There is no evidence of cardiac tamponade.   4. The mitral valve is normal in structure. Mild to moderate mitral valve  regurgitation. No evidence of mitral stenosis.   5. The aortic valve is normal in structure. Aortic valve regurgitation is  not visualized. No aortic stenosis is present.   6. The inferior vena cava is normal in size with <50% respiratory  variability, suggesting right atrial pressure of 8 mmHg.  Patient Profile  Raymond Ryan is a 58 y.o. male with diabetes, hypertension, hyperlipidemia, acid reflux admitted on  10/03/2024 with acute systolic heart failure.  Assessment & Plan   # Acute systolic heart failure # Nonischemic - EF 25% on my review.  No significant CAD.  Cardiac MRI is pending. - Still volume up by cath yesterday.  Plan for Lasix 80 mg IV twice daily today. - Continue carvedilol 3.125 mg twice daily, Aldactone 12.5 mg daily.  Add Entresto 24-26 mg twice daily.  Add Jardiance 10 mg daily. - Unclear etiology but cardiac MRI is pending.  TSH is normal.  A1c 6.1.  He does have a family history of heart  disease but not of congestive heart failure.  Hopefully MRI will shed light on etiology. - Anticipate discharge tomorrow.  # Elevated troponin, demand ischemia - No significant CAD.  No strong indication for aspirin.  Continue statin given diabetes.  # CKD 3a - Kidney function stable.  # DM - A1c 6.1  # HLD - Lipitor 40.  LDL goal less than 70.  Diabetes     For questions or updates, please contact [facility] Please consult [email or web address] for contact info under         Signed, Darryle T. Barbaraann, MD, [facility] [place]  [facility]  10/05/2024 9:35 AM

## 2024-10-05 NOTE — Progress Notes (Signed)
 [facility] PROGRESS NOTE  Carzell Saldivar (DOB: 1966/05/27) FMW:[id number] PCP: [name] ORN, MD  Brief Narrative: Raymond Ryan is a 58 y.o. male with a history of obesity s/p gastric bypass and on tirzepatide  with recent weight loss, T2DM, HTN who presented to the ED on 10/02/2024 with chest pressure, dyspnea, orthopnea. CT of the chest no evidence of PE scattered ground glass opacities bilaterally with patchy airspace disease at the lung bases possible edema or infiltrate small to moderate bilateral pleural effusions cardiomegaly with moderate pericardial effusion and coronary artery calcifications and aortic atherosclerosis. Echo subsequently showed new HFrEF (LVEF 30-35% with RWMA, moderate pericardial effusion without tamponade. Cardiology was consulted, took for cardiac catheterization showing persistent elevated pressures and no significant obstructive coronary disease.  Subjective: Orthopnea and dyspnea and chest pressure resolved.   Objective: BP 105/79 (BP Location: Left Arm)   Pulse (!) 109   Temp 97.6 F (36.4 C) (Oral)   Resp 20   Ht 6' 4 (1.93 m)   Wt 108.8 kg   SpO2 100%   BMI 29.20 kg/m   Gen: No distress Pulm: Clear,   CV: RRR, distant without MRG GI: Soft, NT, ND, +BS  Neuro: Alert and oriented. No new focal deficits. Ext: Warm, no deformities. 1+ pitting LE edema Skin: No rashes, lesions or ulcers on visualized skin   Assessment & Plan: Principal Problem:   New onset of congestive heart failure (HCC) Active Problems:   Acute systolic heart failure (HCC)   Demand ischemia (HCC)   Pleural effusion   Pericardial effusion  Acute HFrEF, NICM:  - Cardiac MRI pending - Continue lasix 80mg  IV BID, continue strict I/O, weights, BMP monitoring.  - Continue coreg 3.125mg  BID - Continue spironolactone 12.5mg  daily - Add entresto 24-26mg  today - Add jardiance 10mg  daily today  Demand myocardial ischemia: No significant obstructive coronary disease  at cath.   T2DM: HbA1c 6.1%.  - Hold metformin  and cover with SSI  HLD:  - Continue statin, increased atorvastatin  to 40mg  with LDL 102.   Obesity, history of gastric bypass: Body mass index is 29.2 kg/m. down from prior due to tirzepatide .   Bernardino KATHEE Come, MD [facility] [email or web address] 10/05/2024, 2:40 PM

## 2024-10-06 ENCOUNTER — Telehealth (HOSPITAL_COMMUNITY): Payer: Self-pay | Admitting: Pharmacy Technician

## 2024-10-06 ENCOUNTER — Other Ambulatory Visit (HOSPITAL_COMMUNITY): Payer: Self-pay

## 2024-10-06 DIAGNOSIS — I2489 Other forms of acute ischemic heart disease: Secondary | ICD-10-CM | POA: Diagnosis not present

## 2024-10-06 DIAGNOSIS — I5021 Acute systolic (congestive) heart failure: Secondary | ICD-10-CM | POA: Diagnosis not present

## 2024-10-06 DIAGNOSIS — I3139 Other pericardial effusion (noninflammatory): Secondary | ICD-10-CM | POA: Diagnosis not present

## 2024-10-06 LAB — BASIC METABOLIC PANEL WITH GFR
Anion gap: 13 (ref 5–15)
BUN: 17 mg/dL (ref 6–20)
CO2: 28 mmol/L (ref 22–32)
Calcium: 8.9 mg/dL (ref 8.9–10.3)
Chloride: 100 mmol/L (ref 98–111)
Creatinine, Ser: 1.87 mg/dL — ABNORMAL HIGH (ref 0.61–1.24)
GFR, Estimated: 41 mL/min — ABNORMAL LOW (ref >60)
Glucose, Bld: 106 mg/dL — ABNORMAL HIGH (ref 70–99)
Potassium: 3.4 mmol/L — ABNORMAL LOW (ref 3.5–5.1)
Sodium: 141 mmol/L (ref 135–145)

## 2024-10-06 LAB — GLUCOSE, CAPILLARY
Glucose-Capillary: 128 mg/dL — ABNORMAL HIGH (ref 70–99)
Glucose-Capillary: 126 mg/dL — ABNORMAL HIGH (ref 70–99)

## 2024-10-06 LAB — LIPOPROTEIN A (LPA): Lipoprotein (a): 234.5 nmol/L — ABNORMAL HIGH (ref <75.0)

## 2024-10-06 LAB — SEDIMENTATION RATE: Sed Rate: 28 mm/h — ABNORMAL HIGH (ref 0–16)

## 2024-10-06 LAB — C-REACTIVE PROTEIN: CRP: 2.9 mg/dL — ABNORMAL HIGH (ref <1.0)

## 2024-10-06 MED ORDER — SPIRONOLACTONE 25 MG PO TABS
12.5000 mg | ORAL_TABLET | Freq: Every day | ORAL | 0 refills | Status: DC
  Filled 2024-10-06: qty 30, 60d supply, fill #0

## 2024-10-06 MED ORDER — POTASSIUM CHLORIDE CRYS ER 20 MEQ PO TBCR
40.0000 meq | EXTENDED_RELEASE_TABLET | Freq: Once | ORAL | Status: AC
Start: 2024-10-06 — End: 2024-10-06
  Administered 2024-10-06: 40 meq via ORAL
  Filled 2024-10-06: qty 2

## 2024-10-06 MED ORDER — CARVEDILOL 6.25 MG PO TABS
6.2500 mg | ORAL_TABLET | Freq: Two times a day (BID) | ORAL | 1 refills | Status: DC
  Filled 2024-10-06: qty 60, 30d supply, fill #0

## 2024-10-06 MED ORDER — CARVEDILOL 6.25 MG PO TABS: 6.2500 mg | ORAL_TABLET | Freq: Two times a day (BID) | ORAL | Status: DC

## 2024-10-06 MED ORDER — ATORVASTATIN CALCIUM 40 MG PO TABS
40.0000 mg | ORAL_TABLET | Freq: Every day | ORAL | 3 refills | Status: AC
  Filled 2024-10-06: qty 90, 90d supply, fill #0

## 2024-10-06 MED ORDER — CARVEDILOL 6.25 MG PO TABS
6.2500 mg | ORAL_TABLET | Freq: Two times a day (BID) | ORAL | 1 refills | Status: DC
  Filled 2024-10-06: qty 30, 15d supply, fill #0

## 2024-10-06 MED ORDER — SACUBITRIL-VALSARTAN 24-26 MG PO TABS
1.0000 | ORAL_TABLET | Freq: Two times a day (BID) | ORAL | 0 refills | Status: DC
  Filled 2024-10-06: qty 60, 30d supply, fill #0

## 2024-10-06 MED ORDER — FUROSEMIDE 20 MG PO TABS
20.0000 mg | ORAL_TABLET | ORAL | 0 refills | Status: DC | PRN
  Filled 2024-10-06: qty 30, 30d supply, fill #0

## 2024-10-06 MED ORDER — EMPAGLIFLOZIN 10 MG PO TABS
10.0000 mg | ORAL_TABLET | Freq: Every day | ORAL | 0 refills | Status: DC
  Filled 2024-10-06: qty 30, 30d supply, fill #0

## 2024-10-06 NOTE — Telephone Encounter (Signed)
 Pharmacy Patient Advocate Encounter  Received notification from [facility] that Prior Authorization for Sacubitril-Valsartan 24-26MG  tablets  has been APPROVED from 10/06/2024 to 10/06/2025   PA #/Case ID/Reference #: [id number]

## 2024-10-06 NOTE — Progress Notes (Signed)
 Discharge:   Pt verbally understands discharge POC Work note printed and given to patient.  NO PIV OR TELE on during discharge. TOC meds and Lab results pending.  Brother at bedside.   Discharge lounge notified.

## 2024-10-06 NOTE — Discharge Summary (Signed)
 Physician Discharge Summary  Raymond Ryan FMW:[id number] DOB: 10-Aug-1966 DOA: 10/02/2024  PCP: [name] ORN, MD  Admit date: 10/02/2024 Discharge date: 10/06/2024  Time spent: 45 minutes  Recommendations for Outpatient Follow-up:  Cardiology Dr. CLEMENTEEN Betters in 1 to 2 weeks BMP in 1 week Outpatient PET scan and genetic testing   Discharge Diagnoses:  Principal Problem:   New onset of congestive heart failure (HCC) Active Problems:   Acute systolic heart failure (HCC)   Demand ischemia (HCC)   Pleural effusion   Pericardial effusion Borderline diabetes  Discharge Condition: M moved  Diet recommendation: Heart healthy, diabetic  Filed Weights   10/04/24 1144 10/05/24 0500 10/06/24 0500  Weight: 107.5 kg 108.8 kg 106.9 kg    History of present illness:  58 y.o. male with a history of obesity s/p gastric bypass and on tirzepatide  with recent weight loss, T2DM, HTN who presented to the ED on 10/02/2024 with chest pressure, dyspnea, orthopnea. CT of the chest no evidence of PE scattered ground glass opacities bilaterally with patchy airspace disease at the lung bases possible edema or infiltrate small to moderate bilateral pleural effusions cardiomegaly with moderate pericardial effusion and coronary artery calcifications and aortic atherosclerosis. SABRA   Hospital Course:   Acute HFrEF, NICM: new diagnosis Pericardial effusion Echo subsequently showed new HFrEF (LVEF 30-35% with RWMA, moderate pericardial effusion without tamponade.  -Cardiology was consulted, R/LHC- persistent elevated pressures and no significant obstructive coronary disease - Cardiac MRI with LGE, possible cardiac sarcoidosis but does not have extracardiac sarcoidosis, no adenopathy on CTA chest -Improved with diuresis -Transition to oral Coreg, Aldactone, Entresto, Jardiance, Lasix 20 mg as needed -Creatinine slightly higher today however per cards recommendation stable for discharge, repeat labs next week, he  has a follow-up in clinic - Recommend cardiac PET scan and genetic testing as outpatient   Demand myocardial ischemia: No significant obstructive coronary disease at cath.    T2DM: HbA1c 6.1%.  - Stable, Jardiance added   HLD:  - Continue statin, increased atorvastatin  to 40mg  with LDL 102.    Obesity, history of gastric bypass: Body mass index is 29.2 kg/m. down from prior due to tirzepatide .   Discharge Exam: Vitals:   10/06/24 0414 10/06/24 0700  BP: 120/79 106/77  Pulse: 93 92  Resp: 19 18  Temp: 98.2 F (36.8 C) 97.8 F (36.6 C)  SpO2: 98%    Gen: Awake, Alert, Oriented X 3,  HEENT: no JVD Lungs: Good air movement bilaterally, CTAB CVS: S1S2/RRR Abd: soft, Non tender, non distended, BS present Extremities: No edema Skin: no new rashes on exposed skin   Discharge Instructions   Discharge Instructions     Diet - low sodium heart healthy   Complete by: As directed    Increase activity slowly   Complete by: As directed       Allergies as of 10/06/2024   No Known Allergies      Medication List     STOP taking these medications    losartan  50 MG tablet Commonly known as: COZAAR        TAKE these medications    Accu-Chek Guide Test test strip Generic drug: glucose blood Use to test blood glucose before meals and at bedtime   Accu-Chek Softclix Lancets lancets Use as instructed   atorvastatin  40 MG tablet Commonly known as: Lipitor Take 1 tablet (40 mg total) by mouth daily. What changed:  medication strength how much to take   carvedilol 6.25 MG tablet Commonly  known as: COREG Take 1 tablet (6.25 mg total) by mouth 2 (two) times daily.   CENTRUM SILVER MEN 50+ PO Take 1 tablet by mouth daily.   empagliflozin 10 MG Tabs tablet Commonly known as: JARDIANCE Take 1 tablet (10 mg total) by mouth daily.   furosemide 20 MG tablet Commonly known as: Lasix Take 1 tablet (20 mg total) by mouth as needed (for increased swelling, weight gain  of 3lb in 1 day or 5lb in 1 week).   gabapentin  300 MG capsule Commonly known as: NEURONTIN  TAKE 1 CAPSULE (300MG  TOTAL) BY MOUTH AT BEDTIME AS NEEDED   metFORMIN  500 MG 24 hr tablet Commonly known as: GLUCOPHAGE -XR Take 1 tablet (500 mg total) by mouth daily with supper. What changed: when to take this   sacubitril-valsartan 24-26 MG Commonly known as: ENTRESTO Take 1 tablet by mouth 2 (two) times daily.   spironolactone 25 MG tablet Commonly known as: ALDACTONE Take 0.5 tablets (12.5 mg total) by mouth daily.   tamsulosin  0.4 MG Caps capsule Commonly known as: FLOMAX  Take 1 capsule (0.4 mg total) by mouth daily.   tirzepatide  15 MG/0.5ML Pen Commonly known as: MOUNJARO  Inject 15 mg into the skin once a week. What changed: when to take this       No Known Allergies  Follow-up Information     [facility]. Go in 12 day(s).   Specialty: Cardiology Why: Hospital follow up 10/18/2024 @ 8:15 am PLEASE bring a current medication list to appointment FREE valet parking, Entrance C, off [facility] entrance Contact information: [address] [place]  [address] [phone]        [name] ORN, MD Follow up in 1 week(s).   Specialties: Internal Medicine, Radiology Contact information: [address] [place] [address] [phone]                  The results of significant diagnostics from this hospitalization (including imaging, microbiology, ancillary and laboratory) are listed below for reference.    Significant Diagnostic Studies: MR CARDIAC VELOCITY FLOW MAP Result Date: 10/05/2024 CLINICAL DATA:  Nonischemic cardiomyopathy EXAM: CARDIAC MRI TECHNIQUE: The patient was scanned on a 1.5 Tesla GE magnet. A dedicated cardiac coil was used. Functional imaging was done using Fiesta sequences. 2,3, and 4 chamber views were done to assess for  RWMA's. Modified Simpson's rule using a short axis stack was used to calculate an ejection fraction on a dedicated work [employer]. The patient received 10 cc of Gadavist. After 10 minutes inversion recovery sequences were used to assess for infiltration and scar tissue. FINDINGS: Small bilateral pleural effusions with bibasilar atelectasis. Moderate circumferential pericardial effusion without signs of tamponade (right atrial indentation noted). Borderline elevated left ventricular size with normal wall thickness. Global hypokinesis, LV EF 27%. Normal right ventricular size with RV EF 29%. Moderate left atrial enlargement, normal right atrium. Trileaflet aortic valve with no stenosis, trivial regurgitation with regurgitant fraction 5%. Moderate mitral regurgitation with regurgitant fraction 27%. On delayed enhancement imaging, there was mid-wall late gadolinium enhancement (LGE) in the basal anteroseptal wall at the anterior RV insertion site and in the basal to mid inferoseptal wall at the inferior RV insertion site, LGE pattern in these locations looked like the triangle sign that can be seen with cardiac sarcoidosis. There was more subtle mid-wall LGE in the basal septum between the RV insertion sites. There also appeared  to be some LGE in the basal RV free wall. Patchy mid-wall LGE in the basal inferolateral wall. LGE comprised 12% of the LV myocardium. MEASUREMENTS: MEASUREMENTS LVEDV 245 mL LVEDVi 102 mL/m2 LVSV 66 mL LVEF 27% RVEDV 206 mL RVEDVi 85 mL/m2 RVSV 59 mL RVEF 29% Aortic forward volume 48 mL Aortic regurgitant fraction 5% Global T1 1130, ECV 31% Global T2 47 (within normal limits) IMPRESSION: 1.  Moderate circumferential pericardial effusion without tamponade. 2. Borderline dilated LV with normal wall thickness, global hypokinesis with EF 27%. 3.  Normal RV size with RV EF 29%. 4.  Moderate mitral regurgitation with regurgitant fraction 27%. 5. Mildly elevated extracellular  volume percentage at 31%, suggesting mildly increased myocardial fibrotic content. 6. Non-coronary delayed enhancement pattern noted. LGE forms about 12% of the LV myocardium. There is prominent mid-wall LGE at the inferior and anterior RV insertion sites, this is suggestive of the triangle sign that can be seen with cardiac sarcoidosis. There is more subtle mid-wall LGE in the basal septum between the RV insertions. There is patchy mid-wall LGE in the basal inferolateral wall as well as some possible basal RV LGE. Would consider cardiac sarcoidosis as a possible diagnosis. Cannot rule out prior myocarditis. Not a typical amyloidosis pattern. Dalton Mclean Electronically Signed   By: Ezra Shuck M.D.   On: 10/05/2024 12:57   MR CARDIAC VELOCITY FLOW MAP Result Date: 10/05/2024 CLINICAL DATA:  Nonischemic cardiomyopathy EXAM: CARDIAC MRI TECHNIQUE: The patient was scanned on a 1.5 Tesla GE magnet. A dedicated cardiac coil was used. Functional imaging was done using Fiesta sequences. 2,3, and 4 chamber views were done to assess for RWMA's. Modified Simpson's rule using a short axis stack was used to calculate an ejection fraction on a dedicated work [employer], [employer]. The patient received 10 cc of Gadavist. After 10 minutes inversion recovery sequences were used to assess for infiltration and scar tissue. FINDINGS: Small bilateral pleural effusions with bibasilar atelectasis. Moderate circumferential pericardial effusion without signs of tamponade (right atrial indentation noted). Borderline elevated left ventricular size with normal wall thickness. Global hypokinesis, LV EF 27%. Normal right ventricular size with RV EF 29%. Moderate left atrial enlargement, normal right atrium. Trileaflet aortic valve with no stenosis, trivial regurgitation with regurgitant fraction 5%. Moderate mitral regurgitation with regurgitant fraction 27%. On delayed enhancement imaging, there was mid-wall late gadolinium  enhancement (LGE) in the basal anteroseptal wall at the anterior RV insertion site and in the basal to mid inferoseptal wall at the inferior RV insertion site, LGE pattern in these locations looked like the triangle sign that can be seen with cardiac sarcoidosis. There was more subtle mid-wall LGE in the basal septum between the RV insertion sites. There also appeared to be some LGE in the basal RV free wall. Patchy mid-wall LGE in the basal inferolateral wall. LGE comprised 12% of the LV myocardium. MEASUREMENTS: MEASUREMENTS LVEDV 245 mL LVEDVi 102 mL/m2 LVSV 66 mL LVEF 27% RVEDV 206 mL RVEDVi 85 mL/m2 RVSV 59 mL RVEF 29% Aortic forward volume 48 mL Aortic regurgitant fraction 5% Global T1 1130, ECV 31% Global T2 47 (within normal limits) IMPRESSION: 1.  Moderate circumferential pericardial effusion without tamponade. 2. Borderline dilated LV with normal wall thickness, global hypokinesis with EF 27%. 3.  Normal RV size with RV EF 29%. 4.  Moderate mitral regurgitation with regurgitant fraction 27%. 5. Mildly elevated extracellular volume percentage at 31%, suggesting mildly increased myocardial fibrotic content. 6. Non-coronary delayed enhancement pattern noted. LGE forms  about 12% of the LV myocardium. There is prominent mid-wall LGE at the inferior and anterior RV insertion sites, this is suggestive of the triangle sign that can be seen with cardiac sarcoidosis. There is more subtle mid-wall LGE in the basal septum between the RV insertions. There is patchy mid-wall LGE in the basal inferolateral wall as well as some possible basal RV LGE. Would consider cardiac sarcoidosis as a possible diagnosis. Cannot rule out prior myocarditis. Not a typical amyloidosis pattern. Dalton Mclean Electronically Signed   By: Ezra Shuck M.D.   On: 10/05/2024 12:57   MR CARDIAC MORPHOLOGY W WO CONTRAST Result Date: 10/05/2024 CLINICAL DATA:  Nonischemic cardiomyopathy EXAM: CARDIAC MRI TECHNIQUE: The patient was  scanned on a 1.5 Tesla GE magnet. A dedicated cardiac coil was used. Functional imaging was done using Fiesta sequences. 2,3, and 4 chamber views were done to assess for RWMA's. Modified Simpson's rule using a short axis stack was used to calculate an ejection fraction on a dedicated work [employer] officer, [employer]. The patient received 10 cc of Gadavist. After 10 minutes inversion recovery sequences were used to assess for infiltration and scar tissue. FINDINGS: Small bilateral pleural effusions with bibasilar atelectasis. Moderate circumferential pericardial effusion without signs of tamponade (right atrial indentation noted). Borderline elevated left ventricular size with normal wall thickness. Global hypokinesis, LV EF 27%. Normal right ventricular size with RV EF 29%. Moderate left atrial enlargement, normal right atrium. Trileaflet aortic valve with no stenosis, trivial regurgitation with regurgitant fraction 5%. Moderate mitral regurgitation with regurgitant fraction 27%. On delayed enhancement imaging, there was mid-wall late gadolinium enhancement (LGE) in the basal anteroseptal wall at the anterior RV insertion site and in the basal to mid inferoseptal wall at the inferior RV insertion site, LGE pattern in these locations looked like the triangle sign that can be seen with cardiac sarcoidosis. There was more subtle mid-wall LGE in the basal septum between the RV insertion sites. There also appeared to be some LGE in the basal RV free wall. Patchy mid-wall LGE in the basal inferolateral wall. LGE comprised 12% of the LV myocardium. MEASUREMENTS: MEASUREMENTS LVEDV 245 mL LVEDVi 102 mL/m2 LVSV 66 mL LVEF 27% RVEDV 206 mL RVEDVi 85 mL/m2 RVSV 59 mL RVEF 29% Aortic forward volume 48 mL Aortic regurgitant fraction 5% Global T1 1130, ECV 31% Global T2 47 (within normal limits) IMPRESSION: 1.  Moderate circumferential pericardial effusion without tamponade. 2. Borderline dilated LV with normal wall  thickness, global hypokinesis with EF 27%. 3.  Normal RV size with RV EF 29%. 4.  Moderate mitral regurgitation with regurgitant fraction 27%. 5. Mildly elevated extracellular volume percentage at 31%, suggesting mildly increased myocardial fibrotic content. 6. Non-coronary delayed enhancement pattern noted. LGE forms about 12% of the LV myocardium. There is prominent mid-wall LGE at the inferior and anterior RV insertion sites, this is suggestive of the triangle sign that can be seen with cardiac sarcoidosis. There is more subtle mid-wall LGE in the basal septum between the RV insertions. There is patchy mid-wall LGE in the basal inferolateral wall as well as some possible basal RV LGE. Would consider cardiac sarcoidosis as a possible diagnosis. Cannot rule out prior myocarditis. Not a typical amyloidosis pattern. Dalton Mclean Electronically Signed   By: Ezra Shuck M.D.   On: 10/05/2024 12:56   CARDIAC CATHETERIZATION Addendum Date: 10/04/2024 Coronary angiography 10/04/2024: LM: Normal LAD: Normal. No significant disease. Ramus: Normal. No significant disease. Lcx: Large, dominant, no significant disease. RCA: Nondominant, no  significant disease. LVEDP 31 mmHg Right heart catheterization 10/04/2024: RA: 9 mmHg RV: 59/7 mmHg PA: 58/28 mmHg, mPAP 42 mmHg PCW: 21 mmHg AO sats: 94% PA sats: 66% CO: 6.6 L/min CI: 2.8 L/min/m2 Conclusion: Decompensated nonischemic cardiomyopathy Pulmonary hypertension, WHO Grp II No cardiogenic shock Continue GDMT for HFrEF Newman JINNY Lawrence, MD   Result Date: 10/04/2024   LV end diastolic pressure is moderately elevated. Coronary angiography 10/04/2024: LM: Normal LAD: Normal. No significant disease. Ramus: Normal. No significant disease. Lcx: Large, dominant, no significant disease. RCA: Nondominant, no significant disease. LVEDP 31 mmHg Right heart catheterization 10/04/2024: RA: 9 mmHg RV: 59/7 mmHg PA: 58/28 mmHg, mPAP 42 mmHg PCW: 21 mmHg AO sats: 94% PA sats: 66% CO:  6.6 L/min CI: 2.8 L/min/m2 Conclusion: Decompensated nonischemic cardiomyopathy Pulmonary hypertension, WHO Grp II No cardiogenic shock Continue GDMT for HFrEF Newman JINNY Lawrence, MD   ECHOCARDIOGRAM COMPLETE Result Date: 10/03/2024    ECHOCARDIOGRAM REPORT   Patient Name:   Raymond Ryan Date of Exam: 10/03/2024 Medical Rec #:  [id number]          Height:       76.0 in Accession #:    [id number]         Weight:       246.7 lb Date of Birth:  [birth year]           BSA:          2.422 m Patient Age:    58 years           BP:           118/81 mmHg Patient Gender: M                  HR:           104 bpm. Exam Location:  Inpatient Procedure: 2D Echo, Cardiac Doppler, Color Doppler and Intracardiac            Opacification Agent (Both Spectral and Color Flow Doppler were            utilized during procedure). Indications:    Abnormal EKG R94.31  History:        Patient has no prior history of Echocardiogram examinations.                 Risk Factors:Hypertension and Diabetes. H/O Hyperlipidemia.                 Chronic kidney disease.  Sonographer:    BERNARDA ROCKS Referring Phys: [id number] ELIZABETH G MATHEWS IMPRESSIONS  1. Left ventricular ejection fraction, by estimation, is 30 to 35%. Left ventricular ejection fraction by PLAX is 34 %. The left ventricle has moderately decreased function. The left ventricle demonstrates regional wall motion abnormalities (see scoring  diagram/findings for description). The left ventricular internal cavity size was moderately dilated. Left ventricular diastolic parameters are consistent with Grade III diastolic dysfunction (restrictive).  2. Right ventricular systolic function is normal. The right ventricular size is normal.  3. Moderate pericardial effusion. The pericardial effusion is circumferential. There is no evidence of cardiac tamponade.  4. The mitral valve is normal in structure. Mild to moderate mitral valve regurgitation. No evidence of mitral stenosis.  5. The aortic  valve is normal in structure. Aortic valve regurgitation is not visualized. No aortic stenosis is present.  6. The inferior vena cava is normal in size with <50% respiratory variability, suggesting right atrial pressure of 8 mmHg. Comparison(s): No prior Echocardiogram. Conclusion(s)/Recommendation(s): No  left ventricular mural or apical thrombus/thrombi. FINDINGS  Left Ventricle: Left ventricular ejection fraction, by estimation, is 30 to 35%. Left ventricular ejection fraction by PLAX is 34 %. The left ventricle has moderately decreased function. The left ventricle demonstrates regional wall motion abnormalities. Definity contrast agent was given IV to delineate the left ventricular endocardial borders. The left ventricular internal cavity size was moderately dilated. There is no left ventricular hypertrophy. Left ventricular diastolic parameters are consistent with Grade III diastolic dysfunction (restrictive).  LV Wall Scoring: The entire septum and apex are hypokinetic. Right Ventricle: The right ventricular size is normal. No increase in right ventricular wall thickness. Right ventricular systolic function is normal. Left Atrium: Left atrial size was normal in size. Right Atrium: Right atrial size was normal in size. Pericardium: A moderately sized pericardial effusion is present. The pericardial effusion is circumferential. There is excessive respiratory variation in the mitral valve spectral Doppler velocities. There is no evidence of cardiac tamponade. Mitral Valve: The mitral valve is normal in structure. Mild mitral annular calcification. Mild to moderate mitral valve regurgitation. No evidence of mitral valve stenosis. MV peak gradient, 6.5 mmHg. The mean mitral valve gradient is 3.0 mmHg. Tricuspid Valve: The tricuspid valve is normal in structure. Tricuspid valve regurgitation is not demonstrated. No evidence of tricuspid stenosis. Aortic Valve: The aortic valve is normal in structure. There is mild  aortic valve annular calcification. Aortic valve regurgitation is not visualized. No aortic stenosis is present. Aortic valve mean gradient measures 2.0 mmHg. Aortic valve peak gradient  measures 4.4 mmHg. Aortic valve area, by VTI measures 2.42 cm. Pulmonic Valve: The pulmonic valve was normal in structure. Pulmonic valve regurgitation is not visualized. No evidence of pulmonic stenosis. Aorta: The aortic root is normal in size and structure. Venous: The inferior vena cava is normal in size with less than 50% respiratory variability, suggesting right atrial pressure of 8 mmHg. IAS/Shunts: No atrial level shunt detected by color flow Doppler.  LEFT VENTRICLE PLAX 2D LV EF:         Left            Diastology                ventricular     LV e' medial:    4.35 cm/s                ejection        LV E/e' medial:  27.6                fraction by     LV e' lateral:   11.50 cm/s                PLAX is 34      LV E/e' lateral: 10.4                %. LVIDd:         6.00 cm LVIDs:         5.00 cm LV PW:         0.90 cm LV IVS:        1.00 cm LVOT diam:     2.20 cm LV SV:         42 LV SV Index:   17 LVOT Area:     3.80 cm  LV Volumes (MOD) LV vol d, MOD    195.0 ml A2C: LV vol d, MOD    184.0 ml A4C: LV vol s, MOD  118.0 ml A2C: LV vol s, MOD    115.0 ml A4C: LV SV MOD A2C:   77.0 ml LV SV MOD A4C:   184.0 ml LV SV MOD BP:    65.9 ml RIGHT VENTRICLE RV Basal diam:  3.40 cm RV S prime:     11.20 cm/s TAPSE (M-mode): 1.4 cm LEFT ATRIUM             Index        RIGHT ATRIUM           Index LA diam:        4.50 cm 1.86 cm/m   RA Pressure: 8.00 mmHg LA Vol (A2C):   83.9 ml 34.64 ml/m  RA Area:     13.80 cm LA Vol (A4C):   63.8 ml 26.34 ml/m  RA Volume:   30.00 ml  12.39 ml/m LA Biplane Vol: 74.1 ml 30.60 ml/m  AORTIC VALVE                    PULMONIC VALVE AV Area (Vmax):    2.52 cm     PV Vmax:       0.64 m/s AV Area (Vmean):   2.44 cm     PV Peak grad:  1.6 mmHg AV Area (VTI):     2.42 cm AV Vmax:           105.00  cm/s AV Vmean:          68.400 cm/s AV VTI:            0.173 m AV Peak Grad:      4.4 mmHg AV Mean Grad:      2.0 mmHg LVOT Vmax:         69.50 cm/s LVOT Vmean:        43.900 cm/s LVOT VTI:          0.110 m LVOT/AV VTI ratio: 0.64  AORTA Ao Root diam: 3.10 cm Ao Asc diam:  3.10 cm MITRAL VALVE                TRICUSPID VALVE MV Area (PHT): 7.29 cm     Estimated RAP:  8.00 mmHg MV Area VTI:   1.87 cm MV Peak grad:  6.5 mmHg     SHUNTS MV Mean grad:  3.0 mmHg     Systemic VTI:  0.11 m MV Vmax:       1.27 m/s     Systemic Diam: 2.20 cm MV Vmean:      82.3 cm/s MV Decel Time: 104 msec MR Peak grad: 46.0 mmHg MR Mean grad: 86.0 mmHg MR Vmax:      339.00 cm/s MV E velocity: 120.00 cm/s MV A velocity: 40.20 cm/s MV E/A ratio:  2.99 Emeline Calender Electronically signed by Emeline Calender Signature Date/Time: 10/03/2024/6:09:56 PM    Final    CT Angio Chest PE W and/or Wo Contrast Result Date: 10/02/2024 CLINICAL DATA:  Pulmonary embolism suspected, high probability. EXAM: CT ANGIOGRAPHY CHEST WITH CONTRAST TECHNIQUE: Multidetector CT imaging of the chest was performed using the standard protocol during bolus administration of intravenous contrast. Multiplanar CT image reconstructions and MIPs were obtained to evaluate the vascular anatomy. RADIATION DOSE REDUCTION: This exam was performed according to the departmental dose-optimization program which includes automated exposure control, adjustment of the mA and/or kV according to patient size and/or use of iterative reconstruction technique. CONTRAST:  75mL OMNIPAQUE  IOHEXOL  350 MG/ML SOLN COMPARISON:  08/13/2018. FINDINGS: Cardiovascular: The  heart is enlarged and there is a moderate pericardial effusion. Scattered coronary artery calcifications are noted. There is atherosclerotic calcification of the aorta without evidence of aneurysm. The pulmonary trunk is mildly distended suggesting underlying pulmonary artery hypertension. No evidence of pulmonary embolism is seen.  Evaluation of the distal pulmonary arteries at the lung bases is limited due to suboptimal opacification. Mediastinum/Nodes: Prominent lymph nodes are present in the mediastinum and hilar regions bilaterally, likely reactive. No axillary lymphadenopathy is seen. The thyroid  gland, trachea, and esophagus are within normal limits. There is a small hiatal hernia with evidence of prior gastric surgery. Lungs/Pleura: There are small to moderate bilateral pleural effusions with patchy airspace disease in the bilateral lower lobes. A few scattered ground-glass opacities and subsegmental atelectasis are noted bilaterally. No pneumothorax. Upper Abdomen: Gastric surgery changes are noted. No acute abnormality. Musculoskeletal: Degenerative changes are present the thoracic spine. No acute osseous abnormality is seen. Review of the MIP images confirms the above findings. IMPRESSION: 1. No evidence of pulmonary embolism. 2. Scattered ground-glass opacities bilaterally with patchy airspace disease at the lung bases, possible edema or infiltrate. 3. Small to moderate bilateral pleural effusions. 4. Cardiomegaly with moderate pericardial effusion and coronary artery calcifications. 5. Aortic atherosclerosis. Electronically Signed   By: Leita Birmingham M.D.   On: 10/02/2024 16:18   DG Chest 2 View Result Date: 10/02/2024 CLINICAL DATA:  Chest pain.  Shortness of breath. EXAM: CHEST - 2 VIEW COMPARISON:  Radiograph from 2018 FINDINGS: The heart is enlarged. Vascular congestion and findings of mild pulmonary edema. There are small pleural effusions, left greater than right, with associated basilar opacities. No pneumothorax. No acute osseous findings. IMPRESSION: Cardiomegaly with vascular congestion and mild pulmonary edema. Small pleural effusions, left greater than right. Electronically Signed   By: Andrea Gasman M.D.   On: 10/02/2024 14:05    Microbiology: Recent Results (from the past 240 hours)  Resp panel by RT-PCR  (RSV, Flu A&B, Covid) Anterior Nasal Swab     Status: None   Collection Time: 10/02/24 12:26 PM   Specimen: Anterior Nasal Swab  Result Value Ref Range Status   SARS Coronavirus 2 by RT PCR NEGATIVE NEGATIVE Final   Influenza A by PCR NEGATIVE NEGATIVE Final   Influenza B by PCR NEGATIVE NEGATIVE Final    Comment: (NOTE) The Xpert Xpress SARS-CoV-2/FLU/RSV plus assay is intended as an aid in the diagnosis of influenza from Nasopharyngeal swab specimens and should not be used as a sole basis for treatment. Nasal washings and aspirates are unacceptable for Xpert Xpress SARS-CoV-2/FLU/RSV testing.  Fact Sheet for Patients: BloggerCourse.com  Fact Sheet for Healthcare Providers: SeriousBroker.it  This test is not yet approved or cleared by the [place]  FDA and has been authorized for detection and/or diagnosis of SARS-CoV-2 by FDA under an Emergency Use Authorization (EUA). This EUA will remain in effect (meaning this test can be used) for the duration of the COVID-19 declaration under Section 564(b)(1) of the Act, 21 U.S.C. section 360bbb-3(b)(1), unless the authorization is terminated or revoked.     Resp Syncytial Virus by PCR NEGATIVE NEGATIVE Final    Comment: (NOTE) Fact Sheet for Patients: BloggerCourse.com  Fact Sheet for Healthcare Providers: SeriousBroker.it  This test is not yet approved or cleared by the [place]  FDA and has been authorized for detection and/or diagnosis of SARS-CoV-2 by FDA under an Emergency Use Authorization (EUA). This EUA will remain in effect (meaning this test can be used) for the duration of the  COVID-19 declaration under Section 564(b)(1) of the Act, 21 U.S.C. section 360bbb-3(b)(1), unless the authorization is terminated or revoked.  Performed at [facility], [address], [place], [place] [address]      Labs: Basic  Metabolic Panel: Recent Labs  Lab 10/02/24 1231 10/02/24 1310 10/02/24 2104 10/03/24 0252 10/04/24 0300 10/04/24 1617 10/04/24 1621 10/04/24 1823 10/05/24 0325 10/06/24 0243  NA 142 144  --  144 143 134*  143 143  --  142 141  K 3.6 3.5  --  3.9 3.8 2.8*  3.1* 3.2*  --  4.1 3.4*  CL 110 111  --  106 104  --   --   --  104 100  CO2 22  --   --  26 28  --   --   --  28 28  GLUCOSE 145* 144*  --  96 117*  --   --   --  104* 106*  BUN 10 12  --  11 12  --   --   --  13 17  CREATININE 1.51* 1.60*   < > 1.52* 1.63*  --   --  1.60* 1.56* 1.87*  CALCIUM  8.8*  --   --  8.9 9.0  --   --   --  8.9 8.9   < > = values in this interval not displayed.   Liver Function Tests: Recent Labs  Lab 10/03/24 0252  AST 13*  ALT 11  ALKPHOS 54  BILITOT 1.1  PROT 6.3*  ALBUMIN 3.1*   No results for input(s): LIPASE, AMYLASE in the last 168 hours. No results for input(s): AMMONIA in the last 168 hours. CBC: Recent Labs  Lab 10/02/24 1231 10/02/24 1310 10/02/24 2104 10/03/24 0252 10/04/24 1617 10/04/24 1621 10/04/24 1823  WBC 5.1  --  6.2 5.3  --   --  6.4  HGB 13.3   < > 13.5 12.5* 13.3  13.6 13.9 13.8  HCT 41.6   < > 42.4 39.8 39.0  40.0 41.0 43.5  MCV 85.2  --  85.1 85.2  --   --  85.1  PLT 476*  --  513* 428*  --   --  467*   < > = values in this interval not displayed.   Cardiac Enzymes: No results for input(s): CKTOTAL, CKMB, CKMBINDEX, TROPONINI in the last 168 hours. BNP: BNP (last 3 results) Recent Labs    10/02/24 1302  BNP 764.8*    ProBNP (last 3 results) No results for input(s): PROBNP in the last 8760 hours.  CBG: Recent Labs  Lab 10/05/24 0846 10/05/24 1202 10/05/24 1539 10/05/24 2124 10/06/24 0541  GLUCAP 180* 115* 103* 130* 128*       Signed:  Sigurd Pac MD.  [facility] 10/06/2024, 11:10 AM

## 2024-10-06 NOTE — Telephone Encounter (Signed)
 Pharmacy Patient Advocate Encounter   Received notification from Inpatient Request that prior authorization for Sacubitril-Valsartan 24-26MG  tablets is required/requested.   Insurance verification completed.   The patient is insured through [facility].   Per test claim: PA required; PA submitted to above mentioned insurance via Latent Key/confirmation #/EOC [id number] Status is pending

## 2024-10-06 NOTE — TOC Transition Note (Signed)
 Transition of Care [facility]) - Discharge Note   Patient Details  Name: [name] MRN: [id number] Date of Birth: [birth year]  [facility] CM/SW Contact:  Roxie KANDICE Stain, RN Phone Number: 10/06/2024, 10:15 AM   Clinical Narrative:    Raymond Ryan is stable to discharge home. Follow up apt on [facility] with cardiology and PCP. Patient can afford medication and has transportation.  No ICM (Inpatient Care Management) needs at this time.    Final next level of care: Home/Self Care Barriers to Discharge: Barriers Resolved   Patient Goals and CMS Choice Patient states their goals for this hospitalization and ongoing recovery are:: return home          Discharge Placement            home           Discharge Plan and Services Additional resources added to the After Visit Summary for                                       Social Drivers of Health (SDOH) Interventions SDOH Screenings   Food Insecurity: No Food Insecurity (10/02/2024)  Housing: Unknown (10/05/2024)  Transportation Needs: No Transportation Needs (10/02/2024)  Utilities: Not At Risk (10/02/2024)  Alcohol Screen: Low Risk  (10/05/2024)  Depression (PHQ2-9): Low Risk  (09/15/2023)  Financial Resource Strain: Low Risk  (10/05/2024)  Tobacco Use: Low Risk  (10/02/2024)     Readmission Risk Interventions    10/06/2024   10:15 AM  Readmission Risk Prevention Plan  Post Dischage Appt Complete  Medication Screening Complete  Transportation Screening Complete

## 2024-10-06 NOTE — Progress Notes (Signed)
 Cardiology Progress Note  Patient ID: Raymond Ryan MRN: [id number] DOB: [birth year] Date of Encounter: 10/06/2024 Primary Cardiologist: Darryle ONEIDA Decent, MD  Subjective   Chief Complaint: None.   HPI: Euvolemic on exam.  Slight rise in serum creatinine.  CMR with possible sarcoidosis.  ROS:  All other ROS reviewed and negative. Pertinent positives noted in the HPI.     Telemetry  Overnight telemetry shows sinus rhythm 90s, PVCs, which I personally reviewed.    Physical Exam   Vitals:   10/05/24 2241 10/06/24 0414 10/06/24 0500 10/06/24 0700  BP: 119/78 120/79  106/77  Pulse: 92 93  92  Resp:  19  18  Temp: 97.8 F (36.6 C) 98.2 F (36.8 C)  97.8 F (36.6 C)  TempSrc: Oral Oral  Oral  SpO2: 94% 98%    Weight:   106.9 kg   Height:        Intake/Output Summary (Last 24 hours) at 10/06/2024 0853 Last data filed at 10/06/2024 0810 Gross per 24 hour  Intake 897 ml  Output 1250 ml  Net -353 ml       10/06/2024    5:00 AM 10/05/2024    5:00 AM 10/04/2024   11:44 AM  Last 3 Weights  Weight (lbs) 235 lb 10.8 oz 239 lb 13.8 oz 236 lb 15.9 oz  Weight (kg) 106.9 kg 108.8 kg 107.5 kg    Body mass index is 28.69 kg/m.  General: Well nourished, well developed, in no acute distress Head: Atraumatic, normal size  Eyes: PEERLA, EOMI  Neck: Supple, no JVD Endocrine: No thryomegaly Cardiac: Normal S1, S2; RRR; no murmurs, rubs, or gallops Lungs: Clear to auscultation bilaterally, no wheezing, rhonchi or rales  Abd: Soft, nontender, no hepatomegaly  Ext: No edema, pulses 2+ Musculoskeletal: No deformities, BUE and BLE strength normal and equal Skin: Warm and dry, no rashes   Neuro: Alert and oriented to person, place, time, and situation, CNII-XII grossly intact, no focal deficits  Psych: Normal mood and affect   Cardiac Studies  LHC 10/04/2024 Coronary angiography 10/04/2024: LM: Normal LAD: Normal. No significant disease. Ramus: Normal. No significant disease. Lcx:  Large, dominant, no significant disease. RCA: Nondominant, no significant disease.   LVEDP 31 mmHg  CMR 10/05/2024 IMPRESSION: 1.  Moderate circumferential pericardial effusion without tamponade.   2. Borderline dilated LV with normal wall thickness, global hypokinesis with EF 27%.   3.  Normal RV size with RV EF 29%.   4.  Moderate mitral regurgitation with regurgitant fraction 27%.   5. Mildly elevated extracellular volume percentage at 31%, suggesting mildly increased myocardial fibrotic content.   6. Non-coronary delayed enhancement pattern noted. LGE forms about 12% of the LV myocardium. There is prominent mid-wall LGE at the inferior and anterior RV insertion sites, this is suggestive of the triangle sign that can be seen with cardiac sarcoidosis. There is more subtle mid-wall LGE in the basal septum between the RV insertions. There is patchy mid-wall LGE in the basal inferolateral wall as well as some possible basal RV LGE. Would consider cardiac sarcoidosis as a possible diagnosis. Cannot rule out prior myocarditis. Not a typical amyloidosis pattern. Patient Profile  Raymond Ryan is a 58 y.o. male with diabetes, hypertension, hyperlipidemia, acid reflux admitted on 10/03/2024 with acute systolic heart failure.  Assessment & Plan   # Acute systolic heart failure, EF 27% # Nonischemic cardiomyopathy - Euvolemic on exam.  No further IV diuresis. - Increase carvedilol to 6.25 mg twice  daily - Continue Aldactone 12.5 mg daily - Continue Entresto 24-26 mg twice daily - Continue Jardiance 10 mg daily - Would recommend 20 mg of p.o. Lasix as needed at discharge. - Etiology unclear.  Possible cardiac sarcoidosis but no evidence of extracardiac sarcoidosis.  This can simply be a genetic cardiomyopathy.  We will proceed with outpatient cardiac PET scan as well as likely genetic testing.  He does not have a strong family history but this may shed light on the etiology. -  Slight rise in serum creatinine but for my standpoint can be discharged.  Can have follow-up evaluation of this as an outpatient.  We can recheck labs in our office next week.  # CKD 3a # AKI - Kidney function slightly elevated.  Can likely just have recheck as an outpatient.  He seems quite well today.  # DM - A1c 6.1  # HLD - Lipitor 40 mg daily.  [facility] will sign off.   The patient is ready for discharge today from a cardiac standpoint. Medication Recommendations: As above Other recommendations (labs, testing, etc): Outpatient genetic testing and cardiac PET scan Follow up as an outpatient: 1 to 2 weeks with me  For questions or updates, please contact [facility] Please consult [email or web address] for contact info under         Signed, Darryle T. Barbaraann, MD, [facility]  [facility]  10/06/2024 8:53 AM

## 2024-10-07 ENCOUNTER — Telehealth: Payer: Self-pay | Admitting: *Deleted

## 2024-10-07 NOTE — Transitions of Care (Post Inpatient/ED Visit) (Signed)
   10/07/2024  Name: Raymond Ryan MRN: [id number] DOB: 1966-01-03  Today's TOC FU Call Status: Today's TOC FU Call Status:: Unsuccessful Call (1st Attempt) Unsuccessful Call (1st Attempt) Date: 10/07/24  Attempted to reach the patient regarding the most recent Inpatient visit.  Left HIPAA compliant voice message  Follow Up Plan: Additional outreach attempts will be made to reach the patient to complete the Transitions of Care (Post Inpatient/ED visit) call.   Pls call/ message for questions,  Celia Gibbons [name] Kymoni Lesperance, RN, BSN, CCRN Alumnus RN Care Manager  Transitions of Care  [facility] [phone]: direct office

## 2024-10-08 ENCOUNTER — Telehealth: Payer: Self-pay | Admitting: *Deleted

## 2024-10-08 NOTE — Transitions of Care (Post Inpatient/ED Visit) (Signed)
   10/08/2024  Name: Raymond Ryan MRN: [id number] DOB: 30-Sep-1966  Today's TOC FU Call Status: Today's TOC FU Call Status:: Unsuccessful Call (2nd Attempt) Unsuccessful Call (2nd Attempt) Date: 10/08/24  Attempted to reach the patient regarding the most recent Inpatient visit.  Left HIPAA compliant voice message   Follow Up Plan: Additional outreach attempts will be made to reach the patient to complete the Transitions of Care (Post Inpatient/ED visit) call.   Pls call/ message for questions,  [name] [name] [name], RN, BSN, CCRN Alumnus RN Care Manager  Transitions of Care  [facility] [phone]: direct office

## 2024-10-09 NOTE — Progress Notes (Unsigned)
 Cardiology Office Note:  .   Date:  10/11/2024  ID:  Raymond Ryan, DOB 02/15/1966, MRN [id number] PCP: [name] ORN, MD  [facility] Providers Cardiologist:  Darryle ONEIDA Decent, MD {  History of Present Illness: .    Chief Complaint  Patient presents with   Congestive Heart Failure    Raymond Ryan is a 58 y.o. male with history of CHF who presents for follow-up.    History of Present Illness   Raymond Ryan is a 58 year old male with systolic heart failure who presents for follow-up after hospital discharge.  He was recently diagnosed with systolic heart failure during a hospital stay. He experiences no swelling in his legs, dizziness, or lightheadedness. His current medications include carvedilol 6.25 mg twice daily, Entresto 24/26 mg twice daily, and spironolactone 12.5 mg daily. Lasix is taken as needed for swelling.  His diabetes is managed with Jardiance 10 mg daily. Blood pressure is well-controlled, and he takes Lipitor 40 mg daily for cholesterol management.  He has chronic kidney disease stage 3A and hypertension.  Socially, he does not smoke, consume alcohol, or use drugs. He is single, has a son, and no grandchildren. 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          Problem List Systolic HF -EF 27% -normal LHC 10/04/2024 2. CKD 3a 3. HTN 4. DM -A1c 6.1 5. HLD -T chol 161, TG 84, HDL 42, LDL 102 -Lpa 234    ROS: All other ROS reviewed and negative. Pertinent positives noted in the HPI.     Studies Reviewed: SABRA       CMR 10/05/2024 IMPRESSION: 1.  Moderate circumferential pericardial effusion without tamponade.   2. Borderline dilated LV with normal wall thickness, global hypokinesis with EF 27%.   3.  Normal RV size with RV EF 29%.   4.  Moderate mitral regurgitation with regurgitant fraction 27%.   5. Mildly elevated extracellular volume percentage at 31%, suggesting mildly increased myocardial fibrotic  content.   6. Non-coronary delayed enhancement pattern noted. LGE forms about 12% of the LV myocardium. There is prominent mid-wall LGE at the inferior and anterior RV insertion sites, this is suggestive of the triangle sign that can be seen with cardiac sarcoidosis. There is more subtle mid-wall LGE in the basal septum between the RV insertions. There is patchy mid-wall LGE in the basal inferolateral wall as well as some possible basal RV LGE. Would consider cardiac sarcoidosis as a possible diagnosis. Cannot rule out prior myocarditis. Not a typical amyloidosis pattern. Physical Exam:   VS:  BP 106/64 (BP Location: Right Arm, Patient Position: Sitting, Cuff Size: Normal)   Pulse (!) 109   Ht 6' 4 (1.93 m)   Wt 234 lb 6.4 oz (106.3 kg)   SpO2 99%   BMI 28.53 kg/m    Wt Readings from Last 3 Encounters:  10/11/24 234 lb 6.4 oz (106.3 kg)  10/11/24 223 lb (101.2 kg)  10/06/24 235 lb 10.8 oz (106.9 kg)    GEN: Well nourished, well developed in no acute distress NECK: No JVD; No carotid bruits CARDIAC: RRR, no murmurs, rubs, gallops RESPIRATORY:  Clear to auscultation without rales, wheezing or rhonchi  ABDOMEN: Soft, non-tender, non-distended EXTREMITIES:  No edema; No deformity  ASSESSMENT AND PLAN: .   Assessment and Plan    Chronic systolic heart failure Systolic heart failure with EF 27%, possible sarcoidosis, no coronary artery  disease, asymptomatic. - Order FDG cardiac PET scan for inflammation and sarcoidosis evaluation. - Continue carvedilol 6.25 mg BID, Entresto 24/26 mg BID, spironolactone 12.5 mg daily. Jardiance 10 mg daily.  - Adjust Lasix as needed for leg swelling. - Schedule follow-up in three months with echocardiogram before appointment. - Refer to advanced heart failure clinic next week.  Chronic kidney disease stage 3a CKD stage 3a, monitor due to heart failure medications. - Check BMP today to monitor kidney function.  Essential hypertension Blood  pressure well-controlled. - Continue carvedilol 6.25 mg BID and Entresto 24/26 mg BID.  Type 2 diabetes mellitus without complications Diabetes well-controlled. - Continue Jardiance 10 mg daily.  Mixed hyperlipidemia Cholesterol levels well-controlled. - Continue Lipitor 40 mg daily.              Follow-up: Return in about 3 months (around 01/11/2025).   Signed, Darryle DASEN. Barbaraann, MD, [facility]  [facility]  [address] [place], [place] [address] [phone]  2:15 PM

## 2024-10-11 ENCOUNTER — Telehealth: Payer: Self-pay | Admitting: *Deleted

## 2024-10-11 ENCOUNTER — Ambulatory Visit: Attending: Cardiovascular Disease | Admitting: Cardiovascular Disease

## 2024-10-11 ENCOUNTER — Encounter: Payer: Self-pay | Admitting: Cardiovascular Disease

## 2024-10-11 VITALS — BP 106/64 | HR 109 | Ht 76.0 in | Wt 234.4 lb

## 2024-10-11 DIAGNOSIS — I10 Essential (primary) hypertension: Secondary | ICD-10-CM | POA: Diagnosis not present

## 2024-10-11 DIAGNOSIS — I5022 Chronic systolic (congestive) heart failure: Secondary | ICD-10-CM | POA: Diagnosis not present

## 2024-10-11 DIAGNOSIS — N1831 Chronic kidney disease, stage 3a: Secondary | ICD-10-CM

## 2024-10-11 DIAGNOSIS — E782 Mixed hyperlipidemia: Secondary | ICD-10-CM | POA: Diagnosis not present

## 2024-10-11 NOTE — Patient Instructions (Addendum)
 Medication Instructions:  DECREASE Lasix to as needed only  Refills for Atorvastatin , Carvedilol, Jardiance, Entresto, Spironolactone   *If you need a refill on your cardiac medications before your next appointment, please call your pharmacy*  Lab Work: To be completed today: BMP  If you have labs (blood work) drawn today and your tests are completely normal, you will receive your results only by: MyChart Message (if you have MyChart) OR A paper copy in the mail If you have any lab test that is abnormal or we need to change your treatment, we will call you to review the results.  Testing/Procedures: Your provider has requested that you have a NM PET Sarcoidosis Scan. Someone will be calling you to schedule this scan. Please review the diet instructions at the end of this set of paperwork.  Your physician has requested that you have an echocardiogram prior to your 3 month follow-up with Dr. Barbaraann. Echocardiography is a painless test that uses sound waves to create images of your heart. It provides your doctor with information about the size and shape of your heart and how well your heart's chambers and valves are working. This procedure takes approximately one hour. There are no restrictions for this procedure. Please do NOT wear cologne, perfume, aftershave, or lotions (deodorant is allowed). Please arrive 15 minutes prior to your appointment time.  Please note: We ask at that you not bring children with you during ultrasound (echo/ vascular) testing. Due to room size and safety concerns, children are not allowed in the ultrasound rooms during exams. Our front office staff cannot provide observation of children in our lobby area while testing is being conducted. An adult accompanying a patient to their appointment will only be allowed in the ultrasound room at the discretion of the ultrasound technician under special circumstances. We apologize for any inconvenience.   Follow-Up: At [facility], you and your health needs are our priority.  As part of our continuing mission to provide you with exceptional heart care, our providers are all part of one team.  This team includes your primary Cardiologist (physician) and Advanced Practice Providers or APPs (Physician Assistants and Nurse Practitioners) who all work together to provide you with the care you need, when you need it.  Your next appointment:   3 month(s)  Provider:   Dr. Barbaraann    We recommend signing up for the patient portal called MyChart.  Sign up information is provided on this After Visit Summary.  MyChart is used to connect with patients for Virtual Visits (Telemedicine).  Patients are able to view lab/test results, encounter notes, upcoming appointments, etc.  Non-urgent messages can be sent to your provider as well.   To learn more about what you can do with MyChart, go to [email or web address].   Other Instructions  Cardiac Sarcoidosis/Inflammation PET Scan Patient Instructions  Please report to Radiology at the [facility] Main Entrance 15 minutes early for your test.  [address] [place], [place] [address]  BRING FOOD DIARY WITH YOU TO THIS APPOINTMENT For 24 hours before the test: Do not exercise! Do not eat after 5 pm the day before your test! To make sure that your scanning results are accurate, you MUST follow the sarcoid prep meal diet starting the day before your PET scan. This diet involves eating no carbohydrates 24 hours before the test.  You will keep a log of all that you eat the day before your test. If you have questions or  do not understand this diet, please call [phone] for more information. If you are unable to follow this diet, please discuss an alternative strategy with the coordinator.  If you are diabetic, continue your diabetes medications as usual on the day before until you begin to fast. NO DIABETES MEDICATIONS ONCE YOU BEGIN TO FAST. On the  morning of your test, please wait until after your test to take your morning medications.  What foods can I eat the day before my test?  Drink only water or black coffee (WITHOUT sugar, artificial sweetener, cream, or milk). Eggs (prepared without milk or cheese)  Meat that is either broiled or pan fried in butter WITHOUT breading (chicken, [place], bacon, meat-only sausage, hamburger, steak, fish) Butter, salt & pepper What foods must I AVOID the day before my test?  Do not consume alcoholic beverages, sodas, fruit juice, coffee creamer, or sports drinks  Do not eat vegetables, beans, nuts, fruits, juices, bread, grains, rice, pasta, potatoes, or any baked goods Do not eat dairy products (milk, cheese, etc.)  Do not eat mayonnaise, ketchup, tartar sauce, mustard, or other condiments Do not add sugar, artificial sweeteners, or Splenda (sucralose) to foods or drinks  Do not eat breaded foods (like fried chicken)  Do not eat sweets, candy, gum, sweetened cough drops, lozenges, or sugar  Do not eat sweetened, grilled, or cured meats or meat with carbohydrate-containing additives (some sausages, ham, sweetened bacon)  Suggested items for breakfast, lunch, or dinner:  Breakfast  3 to 5 fatty sausage links fried in butter. 3 to 5 bacon strips.  3 eggs pan fried in butter (no milk or cheese).  Lunch/Dinner  2 hamburger patties fried in butter. Chicken or fatty fish pan fried in butter. No breading. 8 oz. fatty steak pan fried in butter.  Beverages  Drink only water or black coffee. DO NOT ADD SUGAR, ARTIFICIAL SWEETENER, CREAM, OR MILK  Please call [facility] to cancel or reschedule your appointment at [phone] For more information and frequently asked questions, please visit our website : [email or web address]     Cardiac Sarcoidosis/Inflammation PET Scan  Food Diary Name: _____________________________ Please fill in EXACTLY what you have eaten and when  for 24 hours PRIOR to your test date.  Time Food/Drink Comments  Breakfast                Lunch                Dinner                Snacks                 DO NOT EXERCISE THE DAY BEFORE YOUR TEST DO NOT EAT AFTER 5 PM THE DAY BEFORE YOUR TEST.  ON THE DAY OF YOUR TEST, DO NOT EAT ANY FOOD AND ONLY DRINK CLEAR WATER! PLEASE BRING THIS FOOD DIARY WITH YOU TO YOUR APPOINTMENT

## 2024-10-11 NOTE — Transitions of Care (Post Inpatient/ED Visit) (Signed)
 10/11/2024  Name: Raymond Ryan MRN: [id number] DOB: 01/07/1966  Today's TOC FU Call Status: Today's TOC FU Call Status:: Successful TOC FU Call Completed TOC FU Call Complete Date: 10/11/24 Patient's Name and Date of Birth confirmed.  Transition Care Management Follow-up Telephone Call Date of Discharge: 10/06/24 Discharge Facility: [facility]) Type of Discharge: Inpatient Admission Primary Inpatient Discharge Diagnosis:: New onset CHF with pericardial effusion without tamponade; pleural effusion; EF 30-35% How have you been since you were released from the hospital?: Better (I am doing fine and not having any problems.  I understand everything they told me to do and am doing it- I am not feeble minded.  I am planning to go back to work soon and don't need or have time for weekly calls) Any questions or concerns?: No  Items Reviewed: Did you receive and understand the discharge instructions provided?: No (thoroughly reviewed with patient who verbalizes good understanding of same) Medications obtained,verified, and reconciled?: Yes (Medications Reviewed) (Full medication reconciliation/ review completed; clarified dosing of aldactone; confirmed patient obtained/ is taking all newly Rx'd medications as instructed; self-manages medications and denies questions/ concerns around medications today) Any new allergies since your discharge?: No Dietary orders reviewed?: Yes Type of Diet Ordered:: Healthy as possible- low salt for my heart and low carbs and sugars for the diabetes Do you have support at home?: Yes People in Home [RPT]: alone Name of Support/Comfort Primary Source: Reports independent in self-care activities; resides alone; supportive local family members assists as/ if needed/ indicated  Medications Reviewed Today: Medications Reviewed Today     Reviewed by Everrett Lacasse M, RN (Registered Nurse) on 10/11/24 at 1152  Med List Status: <None>   Medication Order  Taking? Sig Documenting Provider Last Dose Status Informant  Accu-Chek Softclix Lancets lancets [id number] Yes Use as instructed Thapa, [place], MD  Active Self, Pharmacy Records  atorvastatin  (LIPITOR) 40 MG tablet [id number] Yes Take 1 tablet (40 mg total) by mouth daily. Fairy Frames, MD  Active   carvedilol (COREG) 6.25 MG tablet [id number] Yes Take 1 tablet (6.25 mg total) by mouth 2 (two) times daily. Fairy Frames, MD  Active   empagliflozin (JARDIANCE) 10 MG TABS tablet [id number] Yes Take 1 tablet (10 mg total) by mouth daily. Fairy Frames, MD  Active   furosemide (LASIX) 20 MG tablet [id number] Yes Take 1 tablet (20 mg total) by mouth as needed (for increased swelling, weight gain of 3lb in 1 day or 5lb in 1 week).  Patient taking differently: Take 20 mg by mouth as needed (for increased swelling, weight gain of 3lb in 1 day or 5lb in 1 week). 10/11/24: Reports during TOC call: I have pretty much been taking this medication every day since I got home from the hospital (Discharged 10/06/24)   Fairy Frames, MD  Active   gabapentin  (NEURONTIN ) 300 MG capsule [id number] Yes TAKE 1 CAPSULE (300MG  TOTAL) BY MOUTH AT BEDTIME AS NEEDED [name] ORN, MD  Active Self, Pharmacy Records  glucose blood (ACCU-CHEK GUIDE TEST) test strip [id number] Yes Use to test blood glucose before meals and at bedtime Thapa, [place], MD  Active Self, Pharmacy Records  metFORMIN  (GLUCOPHAGE -XR) 500 MG 24 hr tablet [id number] Yes Take 1 tablet (500 mg total) by mouth daily with supper. Thapa, [place], MD  Active Self, Pharmacy Records  Multiple Vitamins-Minerals (CENTRUM SILVER MEN 50+ PO) [id number] Yes Take 1 tablet by mouth daily. [provider]  Active Self, Pharmacy Records  sacubitril-valsartan (ENTRESTO) 24-26 MG  [id number] Yes Take 1 tablet by mouth 2 (two) times daily. Fairy Frames, MD  Active   spironolactone (ALDACTONE) 25 MG tablet [id number] Yes Take 0.5 tablets (12.5 mg total) by mouth daily.  Fairy Frames, MD  Active   tamsulosin  (FLOMAX ) 0.4 MG CAPS capsule [id number] Yes Take 1 capsule (0.4 mg total) by mouth daily. [name] ORN, MD  Active Self, Pharmacy Records           Med Note GERI JON HERO   Sat Oct 02, 2024  2:47 PM) No fill history found. Patient insists he takes this medication daily.  tirzepatide  (MOUNJARO ) 15 MG/0.5ML Pen [id number] Yes Inject 15 mg into the skin once a week. Thapa, [place], MD  Active Self, Pharmacy Records           Home Care and Equipment/Supplies: Were Home Health Services Ordered?: No Any new equipment or medical supplies ordered?: No  Functional Questionnaire: Do you need assistance with bathing/showering or dressing?: No Do you need assistance with meal preparation?: No Do you need assistance with eating?: No Do you have difficulty maintaining continence: No Do you need assistance with getting out of bed/getting out of a chair/moving?: No Do you have difficulty managing or taking your medications?: No  Follow up appointments reviewed: PCP Follow-up appointment confirmed?: No (declined offer of care coordination outreach in real-time with scheduling care guide to schedule hospital follow up PCP appointment- reports going to heart doctor today and need to check my work schedule) MD Provider Line Number:[phone] Given: No (verified well-established with current PCP-- however, has not had PCP office visit in > 12 months: encouraged patient to promptly schedule with PCP- he verbalizes understanding and agreement and states will do) Specialist Hospital Follow-up appointment confirmed?: Yes Date of Specialist follow-up appointment?: 10/11/24 (confirmed patient plans to attend scheduled cardiology provider appointment later today) Follow-Up Specialty Provider:: Cardiology provider- new patient Do you need transportation to your follow-up appointment?: No Do you understand care options if your condition(s) worsen?: Yes-patient  verbalized understanding  SDOH Interventions Today    Flowsheet Row Most Recent Value  SDOH Interventions   Food Insecurity Interventions Intervention Not Indicated  Housing Interventions Intervention Not Indicated  Transportation Interventions Intervention Not Indicated  [reports drives self as per baseline]  Utilities Interventions Intervention Not Indicated   See TOC assessment tabs for additional assessment/ TOC intervention information  Additional TOC interventions: Confirmed taking daily weights at home post-hospital discharge as instructed: provided education around rationale for daily weight monitoring at home along with weight gain guidelines/ action plan for weight gain; importance of taking diuretic as prescribed- patient independently verbalizes without need for additional prompting- states has pretty much taken the fluid pill every day since I got home from the hospital- advised to inform cardiology provider of this at time of this afternoon's scheduled office visit Reviewed recent weights at home- as per [facility] assessment: reports general weight ranges post-hospital discharge between 223-232 with weight today of 223 lbs; advised patient to take all recorded weights from home monitoring to upcoming cardiology provider office visit for provider review Using teach back method: clarified dosing of newly prescribed carvedilol; he now understands to be taking 12.5 mg dose every day and not 25 mg po every day  Made patient aware of need to request refills for new medications from cardiology provider at time of scheduled office visit today and clarify any changes if made to medications at time of office visit  Patient adamantly declines need for ongoing/ further care  management/ coordination outreach; declines enrollment in 30-day TOC program- declines taking my direct phone number should needs/ concerns arise post-TOC call   Pls call/ message for questions,  Keeli Roberg [name] Petra Dumler, RN,  BSN, CCRN Alumnus RN Care Manager  Transitions of Care  [facility] - [facility] [phone]: direct office

## 2024-10-12 ENCOUNTER — Ambulatory Visit: Payer: Self-pay | Admitting: Cardiovascular Disease

## 2024-10-12 DIAGNOSIS — I5022 Chronic systolic (congestive) heart failure: Secondary | ICD-10-CM

## 2024-10-12 DIAGNOSIS — N179 Acute kidney failure, unspecified: Secondary | ICD-10-CM

## 2024-10-12 LAB — BASIC METABOLIC PANEL WITH GFR
BUN/Creatinine Ratio: 8 — ABNORMAL LOW (ref 9–20)
BUN: 16 mg/dL (ref 6–24)
CO2: 22 mmol/L (ref 20–29)
Calcium: 10.1 mg/dL (ref 8.7–10.2)
Chloride: 103 mmol/L (ref 96–106)
Creatinine, Ser: 1.99 mg/dL — ABNORMAL HIGH (ref 0.76–1.27)
Glucose: 115 mg/dL — ABNORMAL HIGH (ref 70–99)
Potassium: 4.8 mmol/L (ref 3.5–5.2)
Sodium: 143 mmol/L (ref 134–144)
eGFR: 38 mL/min/1.73 — ABNORMAL LOW (ref >59)

## 2024-10-15 ENCOUNTER — Telehealth (HOSPITAL_COMMUNITY): Payer: Self-pay

## 2024-10-15 NOTE — Telephone Encounter (Addendum)
 Called to confirm/remind patient of their appointment at the [facility] on 10/18/24 8:15.   Appointment:   [] Confirmed  [] Left mess   [x] No answer/No voice mail  [] VM Full/unable to leave message  [] Phone not in service  Patient reminded to bring all medications and/or complete list.  Confirmed patient has transportation. Gave directions, instructed to utilize valet parking.

## 2024-10-18 ENCOUNTER — Ambulatory Visit (HOSPITAL_COMMUNITY)
Admit: 2024-10-18 | Discharge: 2024-10-18 | Disposition: A | Discharge status: Home / Self Care | Attending: Cardiology | Admitting: Cardiology

## 2024-10-18 ENCOUNTER — Encounter (HOSPITAL_COMMUNITY): Payer: Self-pay | Admitting: Emergency Medicine

## 2024-10-18 ENCOUNTER — Encounter (HOSPITAL_COMMUNITY): Payer: Self-pay

## 2024-10-18 VITALS — BP 118/84 | HR 120 | Ht 76.0 in | Wt 234.8 lb

## 2024-10-18 DIAGNOSIS — I272 Pulmonary hypertension, unspecified: Secondary | ICD-10-CM | POA: Insufficient documentation

## 2024-10-18 DIAGNOSIS — I130 Hypertensive heart and chronic kidney disease with heart failure and stage 1 through stage 4 chronic kidney disease, or unspecified chronic kidney disease: Secondary | ICD-10-CM | POA: Diagnosis not present

## 2024-10-18 DIAGNOSIS — Z79899 Other long term (current) drug therapy: Secondary | ICD-10-CM | POA: Diagnosis not present

## 2024-10-18 DIAGNOSIS — N179 Acute kidney failure, unspecified: Secondary | ICD-10-CM | POA: Diagnosis not present

## 2024-10-18 DIAGNOSIS — E1122 Type 2 diabetes mellitus with diabetic chronic kidney disease: Secondary | ICD-10-CM | POA: Diagnosis not present

## 2024-10-18 DIAGNOSIS — R0601 Orthopnea: Secondary | ICD-10-CM | POA: Diagnosis not present

## 2024-10-18 DIAGNOSIS — Z7984 Long term (current) use of oral hypoglycemic drugs: Secondary | ICD-10-CM | POA: Diagnosis not present

## 2024-10-18 DIAGNOSIS — Z9884 Bariatric surgery status: Secondary | ICD-10-CM | POA: Diagnosis not present

## 2024-10-18 DIAGNOSIS — Z7985 Long-term (current) use of injectable non-insulin antidiabetic drugs: Secondary | ICD-10-CM | POA: Insufficient documentation

## 2024-10-18 DIAGNOSIS — R000 Tachycardia, unspecified: Secondary | ICD-10-CM | POA: Insufficient documentation

## 2024-10-18 DIAGNOSIS — E669 Obesity, unspecified: Secondary | ICD-10-CM | POA: Diagnosis not present

## 2024-10-18 DIAGNOSIS — I428 Other cardiomyopathies: Secondary | ICD-10-CM | POA: Insufficient documentation

## 2024-10-18 DIAGNOSIS — I2510 Atherosclerotic heart disease of native coronary artery without angina pectoris: Secondary | ICD-10-CM | POA: Diagnosis not present

## 2024-10-18 DIAGNOSIS — N1831 Chronic kidney disease, stage 3a: Secondary | ICD-10-CM | POA: Insufficient documentation

## 2024-10-18 DIAGNOSIS — Z6828 Body mass index (BMI) 28.0-28.9, adult: Secondary | ICD-10-CM | POA: Insufficient documentation

## 2024-10-18 DIAGNOSIS — I3139 Other pericardial effusion (noninflammatory): Secondary | ICD-10-CM | POA: Insufficient documentation

## 2024-10-18 DIAGNOSIS — I5022 Chronic systolic (congestive) heart failure: Secondary | ICD-10-CM | POA: Insufficient documentation

## 2024-10-18 DIAGNOSIS — E119 Type 2 diabetes mellitus without complications: Secondary | ICD-10-CM

## 2024-10-18 DIAGNOSIS — E11610 Type 2 diabetes mellitus with diabetic neuropathic arthropathy: Secondary | ICD-10-CM | POA: Insufficient documentation

## 2024-10-18 DIAGNOSIS — I10 Essential (primary) hypertension: Secondary | ICD-10-CM

## 2024-10-18 DIAGNOSIS — Z8639 Personal history of other endocrine, nutritional and metabolic disease: Secondary | ICD-10-CM

## 2024-10-18 LAB — BASIC METABOLIC PANEL WITH GFR
Anion gap: 12 (ref 5–15)
BUN: 15 mg/dL (ref 6–20)
CO2: 25 mmol/L (ref 22–32)
Calcium: 9.5 mg/dL (ref 8.9–10.3)
Chloride: 105 mmol/L (ref 98–111)
Creatinine, Ser: 1.69 mg/dL — ABNORMAL HIGH (ref 0.61–1.24)
GFR, Estimated: 47 mL/min — ABNORMAL LOW (ref >60)
Glucose, Bld: 127 mg/dL — ABNORMAL HIGH (ref 70–99)
Potassium: 4.5 mmol/L (ref 3.5–5.1)
Sodium: 142 mmol/L (ref 135–145)

## 2024-10-18 MED ORDER — METOPROLOL SUCCINATE ER 25 MG PO TB24: 25.0000 mg | ORAL_TABLET | Freq: Every day | ORAL | 5 refills | Status: DC

## 2024-10-18 MED ORDER — FUROSEMIDE 20 MG PO TABS: 20.0000 mg | ORAL_TABLET | Freq: Every day | ORAL | 3 refills | Status: AC | PRN

## 2024-10-18 MED ORDER — FUROSEMIDE 20 MG PO TABS: 20.0000 mg | ORAL_TABLET | Freq: Every day | ORAL | 3 refills | Status: DC

## 2024-10-18 NOTE — Progress Notes (Signed)
 ReDS Vest / Clip - 10/18/24 0811       ReDS Vest / Clip   Station Marker D    Ruler Value 35    ReDS Value Range Low volume    ReDS Actual Value 31

## 2024-10-18 NOTE — Progress Notes (Signed)
 HEART & VASCULAR TRANSITION OF CARE CONSULT NOTE   Referring Physician: Fairy Frames, MD PCP: [name] ORN, MD  Cardiologist: Darryle ONEIDA Decent, MD  HPI: Referred to clinic by Dr. Fairy, Internal Medicine for heart failure consultation.   Raymond Ryan is a 58 y.o. male with history of obesity s/p gastric bypass on tizepatide, CKD 3a, T2DM, and HTN. Now with new HFrEF.   Admitted to [facility] 10/02/24 with chest pain and dyspnea. CTPE negative. Found to have new systolic heart failure. EF 30-35% with RWMA and a moderate pericardial effusion. R/LHC with elevated filling pressures w/o obstructive coronary disease. 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 He was started on GDMT and cardiac PET scan as well as genetic testing reccomended in OP.   He was seen for post hospital by Dr. Decent, [facility]. Had been doing well. GDMT: entresto, spiro, coreg, and PRN lasix. PET scan ordered.   Today he  presents for transition of care visit. Overall feeling unwell. NYHA III, limited mostly by fatigue. Reports dyspnea, fatigue, orthopnea, and dizzness. Denies chest pain and palpitations. Able to perform ADLs. Appetite okay. Weight at home stable. Compliant with all medications. Denies ETOH, tobacco, or drug use. Reports that he does not have a strong family history of cardiac disease. Works currently as a Surveyor, mining, however with newly reduced EF, he will not be able to return to work. Discussed.   Cardiac Testing:  - CMR 10/25: LVEF 27%, RVEF 29%, mod MR, NICM LGE pattern in 12% LV myocardium, LGE also with triangle sign consistent with sarcoidosis. - R/LHC: nonobstructive CAD, NICM, RA 9, pa 58/28 (42), pcw 21, co/ci 6.6/2.8 - Echo 10/25: EF 30-35%, G3DD, nl RV, mod pericardial effusion  Past Medical History:  Diagnosis Date   Charcot foot due to diabetes mellitus (HCC)    Chronic kidney disease    Constipation    sometimes soft, some  hard -- does go daily - are on fiber    Diabetes mellitus    GERD (gastroesophageal reflux disease)    Headache    Hyperlipidemia    Hypertension    Neuromuscular disorder (HCC)    neuropathy    Current Outpatient Medications  Medication Sig Dispense Refill   Accu-Chek Softclix Lancets lancets Use as instructed 100 each 12   atorvastatin  (LIPITOR) 40 MG tablet Take 1 tablet (40 mg total) by mouth daily. 90 tablet 3   empagliflozin (JARDIANCE) 10 MG TABS tablet Take 1 tablet (10 mg total) by mouth daily. 30 tablet 0   gabapentin  (NEURONTIN ) 300 MG capsule TAKE 1 CAPSULE (300MG  TOTAL) BY MOUTH AT BEDTIME AS NEEDED 90 capsule 0   glucose blood (ACCU-CHEK GUIDE TEST) test strip Use to test blood glucose before meals and at bedtime 300 each 12   metFORMIN  (GLUCOPHAGE -XR) 500 MG 24 hr tablet Take 1 tablet (500 mg total) by mouth daily with supper. 90 tablet 3   metoprolol succinate (TOPROL-XL) 25 MG 24 hr tablet Take 1 tablet (25 mg total) by mouth daily. Take with or immediately following a meal. 30 tablet 5   Multiple Vitamins-Minerals (CENTRUM SILVER MEN 50+ PO) Take 1 tablet by mouth daily.     sacubitril-valsartan (ENTRESTO) 24-26 MG Take 1 tablet by mouth 2 (two) times daily. 60 tablet 0   tamsulosin  (FLOMAX ) 0.4 MG CAPS capsule Take 1 capsule (0.4 mg total) by mouth daily. 90 capsule 3   tirzepatide  (MOUNJARO ) 15  MG/0.5ML Pen Inject 15 mg into the skin once a week. 6 mL 4   furosemide (LASIX) 20 MG tablet Take 1 tablet (20 mg total) by mouth daily as needed for fluid or edema. 30 tablet 3   spironolactone (ALDACTONE) 25 MG tablet Take 0.5 tablets (12.5 mg total) by mouth daily. (Patient not taking: Reported on 10/18/2024) 30 tablet 0   No current facility-administered medications for this encounter.    No Known Allergies    Social History   Socioeconomic History   Marital status: Single    Spouse name: Not on file   Number of children: 1   Years of education: 12   Highest  education level: Not on file  Occupational History   Not on file  Tobacco Use   Smoking status: Never   Smokeless tobacco: Never  Vaping Use   Vaping status: Never Used  Substance and Sexual Activity   Alcohol use: No   Drug use: No   Sexual activity: Not on file  Other Topics Concern   Not on file  Social History Narrative   Not on file   Social Drivers of Health   Financial Resource Strain: Low Risk  (10/05/2024)   Overall Financial Resource Strain (CARDIA)    Difficulty of Paying Living Expenses: Not very hard  Food Insecurity: No Food Insecurity (10/11/2024)   Hunger Vital Sign    Worried About Running Out of Food in the Last Year: Never true    Ran Out of Food in the Last Year: Never true  Transportation Needs: No Transportation Needs (10/11/2024)   PRAPARE - Administrator, Civil Service (Medical): No    Lack of Transportation (Non-Medical): No  Physical Activity: Not on file  Stress: Not on file  Social Connections: Not on file  Intimate Partner Violence: Not At Risk (10/11/2024)   Humiliation, Afraid, Rape, and Kick questionnaire    Fear of Current or Ex-Partner: No    Emotionally Abused: No    Physically Abused: No    Sexually Abused: No      Family History  Problem Relation Age of Onset   Diabetes Mellitus II Mother    CAD Mother    Prostate cancer Father    Prostate cancer Maternal Uncle    Prostate cancer Maternal Uncle    Colon cancer Neg Hx    Colon polyps Neg Hx    Blood pressure 118/84, pulse (!) 120, height 6' 4 (1.93 m), weight 106.5 kg (234 lb 12.8 oz), SpO2 98%.  Filed Weights   10/18/24 0811  Weight: 106.5 kg (234 lb 12.8 oz)   PHYSICAL EXAM: General: Well appearing. No distress on RA Cardiac: JVP flat. S1 and S2 present. No murmurs Abdomen: Soft, non-tender, non-distended.  Extremities: Warm and dry.  No peripheral edema.  Neuro: Alert and oriented x3. Affect pleasant. Moves all extremities without difficulty.  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 PET scan scheduled for 10/21/24. - NYHA III, limited mostly by fatigue. Appears euvolemic on exam and ReDs. + Orthopnea. - continue lasix 20 mg daily only as needed - GDMT: ? blocker: stop coreg; start toprolXL 25  mg daily at bedtime to assist with dizziness ARB/ARNI: continue entresto 24/26 mg bid MRA: off spiro with K, repeat BMET. Will call with instructions to restart if Cr/K ok SGLT2i: continue jardiance 10 mg daily  Pericardial effusion - mod on 10/25 echo - with tachycardia, bedside echo by Dr. 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  AKI on CKD 3a - baseline Cr fluctuant ~1.3-1.6; last 1.99 - on SGLT2i - BMET today  Sinus Tachycardia - HR 120 on ECG - very nervous; does not appear low output - pericardial effusion slightly larger, however without evidence of tamponade per Dr. 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 FMLA paperwork to be filled out. He has short term and long term disability.    Follow up in 3 weeks with Dr. Cherrie  I have personally spent 45 minutes involved in face-to-face and non-face-to-face activities for this patient on the day of the visit. Professional time spent includes the following activities: Preparing to see the patient (review of tests), Obtaining and/or reviewing separately obtained history (admission/discharge record), Performing a medically appropriate examination and/or evaluation , Ordering medications/tests/procedures, referring and communicating with other health care professionals, Documenting clinical information in the EMR, Independently interpreting results (not separately reported), Communicating results to the patient/family/caregiver, Counseling and educating the patient/family/caregiver and Care coordination (not separately reported).    [place] Yoniel Arkwright, NP 10/18/24

## 2024-10-18 NOTE — Patient Instructions (Addendum)
 Medication Changes:  STOP CARVEDILOL   START METOPROLOL SUCCINATE 25MG  ONCE DAILY AT BEDTIME   CONTINUE LASIX (FUROSEMIDE) 20MG  ONCE DAILY AS NEEDED FOR SWELLING OR WEIGHT GAIN   WE WILL CALL AFTER YOUR LABS REGARDING INSTRUCTIONS FOR SPIRONOLACTONE   Lab Work:  Labs done today, your results will be available in MyChart, we will contact you for abnormal readings.  Follow-Up in: 3 WEEKS WITH DR. CHERRIE AS SCHEDULED   At the [facility], you and your health needs are our priority. We have a designated team specialized in the treatment of Heart Failure. This Care Team includes your primary Heart Failure Specialized Cardiologist (physician), Advanced Practice Providers (APPs- Physician Assistants and Nurse Practitioners), and Pharmacist who all work together to provide you with the care you need, when you need it.   You may see any of the following providers on your designated Care Team at your next follow up:  Dr. Toribio CHERRIE Dr. Ezra Shuck Dr. Ria Commander Dr. Odis Brownie Greig Mosses, NP Caffie Shed, [place] [facility] [place], [place] Beckey Coe, NP [place] Lee, NP Tinnie Redman, PharmD   Please be sure to bring in all your medications bottles to every appointment.   Need to Contact Us :  If you have any questions or concerns before your next appointment please send us  a message through [facility] or call our office at [phone].    TO LEAVE A MESSAGE FOR THE NURSE SELECT OPTION 2, PLEASE LEAVE A MESSAGE INCLUDING: YOUR NAME DATE OF BIRTH CALL BACK NUMBER REASON FOR CALL**this is important as we prioritize the call backs  YOU WILL RECEIVE A CALL BACK THE SAME DAY AS LONG AS YOU CALL BEFORE 4:00 PM

## 2024-10-19 ENCOUNTER — Telehealth (HOSPITAL_COMMUNITY): Payer: Self-pay | Admitting: *Deleted

## 2024-10-19 ENCOUNTER — Encounter (HOSPITAL_COMMUNITY): Payer: Self-pay

## 2024-10-19 ENCOUNTER — Ambulatory Visit (HOSPITAL_COMMUNITY): Payer: Self-pay | Admitting: Cardiology

## 2024-10-19 LAB — ANGIOTENSIN CONVERTING ENZYME: Angiotensin-Converting Enzyme: 21 U/L (ref 14–82)

## 2024-10-19 NOTE — Telephone Encounter (Signed)
 Attempted to call patient regarding upcoming cardiac PET appointment. Left message on voicemail with name and callback number Johney Frame RN Navigator Cardiac Imaging [facility] [phone] Office

## 2024-10-21 ENCOUNTER — Encounter (HOSPITAL_COMMUNITY)
Admission: RE | Admit: 2024-10-21 | Discharge: 2024-10-21 | Disposition: A | Discharge status: Home / Self Care | Source: Ambulatory Visit | Attending: Cardiovascular Disease | Admitting: Cardiovascular Disease

## 2024-10-21 DIAGNOSIS — I5022 Chronic systolic (congestive) heart failure: Secondary | ICD-10-CM | POA: Insufficient documentation

## 2024-10-27 ENCOUNTER — Ambulatory Visit (HOSPITAL_COMMUNITY)
Admission: RE | Admit: 2024-10-27 | Discharge: 2024-10-27 | Disposition: A | Discharge status: Home / Self Care | Source: Ambulatory Visit | Attending: Cardiovascular Disease | Admitting: Cardiovascular Disease

## 2024-10-27 DIAGNOSIS — I5022 Chronic systolic (congestive) heart failure: Secondary | ICD-10-CM | POA: Insufficient documentation

## 2024-10-27 DIAGNOSIS — I3139 Other pericardial effusion (noninflammatory): Secondary | ICD-10-CM | POA: Diagnosis not present

## 2024-10-27 DIAGNOSIS — E119 Type 2 diabetes mellitus without complications: Secondary | ICD-10-CM | POA: Diagnosis not present

## 2024-10-27 DIAGNOSIS — E785 Hyperlipidemia, unspecified: Secondary | ICD-10-CM | POA: Diagnosis not present

## 2024-10-27 DIAGNOSIS — I358 Other nonrheumatic aortic valve disorders: Secondary | ICD-10-CM | POA: Diagnosis not present

## 2024-10-27 DIAGNOSIS — I110 Hypertensive heart disease with heart failure: Secondary | ICD-10-CM | POA: Insufficient documentation

## 2024-10-27 LAB — ECHOCARDIOGRAM COMPLETE
Calc EF: 33.2 %
S' Lateral: 4.5 cm
Single Plane A2C EF: 32.4 %
Single Plane A4C EF: 34.8 %

## 2024-10-28 ENCOUNTER — Telehealth: Payer: Self-pay | Admitting: Cardiovascular Disease

## 2024-10-28 ENCOUNTER — Encounter: Payer: Self-pay | Admitting: Cardiovascular Disease

## 2024-10-28 NOTE — Telephone Encounter (Signed)
 Patient stated he is being followed by Dr. Nelle office.

## 2024-10-28 NOTE — Telephone Encounter (Signed)
 Patient aware of 7-14 business day turn around time. -will receive call once completed   Confirmed forms are in process with Powell RAMAN, RN

## 2024-10-28 NOTE — Telephone Encounter (Signed)
 10/21/24 Lvm to schedule 3 Month f/u-bmg  10/19/24 LVM to schedule 3 month f/u - LCN  10/14/24 LVM to c/b to schedule 3 F/U.bmg  Pt has been called to schedule 3 month f/u but has not responded. Letter sent through mychart

## 2024-10-28 NOTE — Telephone Encounter (Signed)
 Spoke with pt who states he cannot afford to see both Dr Barbaraann and CHF clinic due to being out of work.  Pt advised of the difference between  providers.  Pt states he dropped off disability paperwork to be completed by CHF clinic.  Pt states he is not aware that paperwork has been completed and is most concerned.  Pt advised staff has 14 days to complete the paperwork but will forward to CHF clinic to express his concern.  Will forward to CHF clinic staff regarding completion of disability paperwork.  Pt verbalizes understanding and thanked CHARITY [employer]DR[employer] for the callback.

## 2024-11-01 ENCOUNTER — Other Ambulatory Visit: Payer: Self-pay | Admitting: Internal Medicine

## 2024-11-01 ENCOUNTER — Other Ambulatory Visit: Payer: Self-pay

## 2024-11-01 DIAGNOSIS — Z760 Encounter for issue of repeat prescription: Secondary | ICD-10-CM

## 2024-11-02 ENCOUNTER — Telehealth (HOSPITAL_COMMUNITY): Payer: Self-pay | Admitting: *Deleted

## 2024-11-02 NOTE — Telephone Encounter (Signed)
 Reaching out to patient to offer assistance regarding upcoming cardiac imaging study; pt verbalizes understanding of appt date/time, parking situation and where to check in, pre-test NPO status; name and call back number provided for further questions should they arise  Larey Brick RN Navigator Cardiac Imaging [facility] [phone] office [phone] cell  Patient verbalized understanding of diet prep.

## 2024-11-04 ENCOUNTER — Ambulatory Visit (HOSPITAL_COMMUNITY)
Admission: RE | Admit: 2024-11-04 | Discharge: 2024-11-04 | Disposition: A | Discharge status: Home / Self Care | Source: Ambulatory Visit | Attending: Cardiovascular Disease | Admitting: Cardiovascular Disease

## 2024-11-04 DIAGNOSIS — K760 Fatty (change of) liver, not elsewhere classified: Secondary | ICD-10-CM | POA: Diagnosis not present

## 2024-11-04 DIAGNOSIS — I5022 Chronic systolic (congestive) heart failure: Secondary | ICD-10-CM | POA: Insufficient documentation

## 2024-11-04 MED ORDER — RUBIDIUM RB82 GENERATOR (RUBYFILL)
27.4400 | PACK | Freq: Once | INTRAVENOUS | Status: AC
  Administered 2024-11-04: 27.44 via INTRAVENOUS

## 2024-11-04 MED ORDER — FLUDEOXYGLUCOSE F - 18 (FDG) INJECTION
8.9700 | Freq: Once | INTRAVENOUS | Status: AC
  Administered 2024-11-04: 8.97 via INTRAVENOUS

## 2024-11-05 LAB — NM PET CT MYOCARDIAL SARCOIDOSIS
LV dias vol: 171 mL (ref 62–150)
LV sys vol: 119 mL (ref 4.2–5.8)
Nuc Stress EF: 30 %
Rest Nuclear Isotope Dose: 2.4 mCi

## 2024-11-08 ENCOUNTER — Encounter (HOSPITAL_COMMUNITY): Payer: Self-pay | Admitting: *Deleted

## 2024-11-08 NOTE — Progress Notes (Signed)
 FMLA forms completed and signed by Dr Cherrie. Forms faxed to [phone]. Pt aware and copy left for him at front desk.

## 2024-11-10 ENCOUNTER — Telehealth (HOSPITAL_COMMUNITY): Payer: Self-pay | Admitting: Internal Medicine

## 2024-11-10 NOTE — Telephone Encounter (Signed)
 Called to confirm/remind patient of their appointment at the [facility] on 11/10/2024.   Appointment:   [] Confirmed  [x] Left mess   [] No answer/No voice mail  [] VM Full/unable to leave message  [] Phone not in service  Patient reminded to bring all medications and/or complete list.  Confirmed patient has transportation. Gave directions, instructed to utilize valet parking.

## 2024-11-11 ENCOUNTER — Ambulatory Visit (HOSPITAL_COMMUNITY)
Admission: RE | Admit: 2024-11-11 | Discharge: 2024-11-11 | Disposition: A | Discharge status: Home / Self Care | Source: Ambulatory Visit | Attending: Internal Medicine | Admitting: Internal Medicine

## 2024-11-11 VITALS — BP 128/80 | HR 90 | Wt 234.8 lb

## 2024-11-11 DIAGNOSIS — E119 Type 2 diabetes mellitus without complications: Secondary | ICD-10-CM

## 2024-11-11 DIAGNOSIS — Z7984 Long term (current) use of oral hypoglycemic drugs: Secondary | ICD-10-CM | POA: Insufficient documentation

## 2024-11-11 DIAGNOSIS — E1122 Type 2 diabetes mellitus with diabetic chronic kidney disease: Secondary | ICD-10-CM | POA: Insufficient documentation

## 2024-11-11 DIAGNOSIS — I3139 Other pericardial effusion (noninflammatory): Secondary | ICD-10-CM

## 2024-11-11 DIAGNOSIS — I5082 Biventricular heart failure: Secondary | ICD-10-CM | POA: Diagnosis not present

## 2024-11-11 DIAGNOSIS — Z7985 Long-term (current) use of injectable non-insulin antidiabetic drugs: Secondary | ICD-10-CM | POA: Diagnosis not present

## 2024-11-11 DIAGNOSIS — I428 Other cardiomyopathies: Secondary | ICD-10-CM | POA: Insufficient documentation

## 2024-11-11 DIAGNOSIS — I130 Hypertensive heart and chronic kidney disease with heart failure and stage 1 through stage 4 chronic kidney disease, or unspecified chronic kidney disease: Secondary | ICD-10-CM | POA: Insufficient documentation

## 2024-11-11 DIAGNOSIS — N1831 Chronic kidney disease, stage 3a: Secondary | ICD-10-CM | POA: Diagnosis not present

## 2024-11-11 DIAGNOSIS — Z79899 Other long term (current) drug therapy: Secondary | ICD-10-CM | POA: Insufficient documentation

## 2024-11-11 DIAGNOSIS — Z6828 Body mass index (BMI) 28.0-28.9, adult: Secondary | ICD-10-CM | POA: Insufficient documentation

## 2024-11-11 DIAGNOSIS — Z8639 Personal history of other endocrine, nutritional and metabolic disease: Secondary | ICD-10-CM

## 2024-11-11 DIAGNOSIS — E669 Obesity, unspecified: Secondary | ICD-10-CM | POA: Diagnosis not present

## 2024-11-11 DIAGNOSIS — N179 Acute kidney failure, unspecified: Secondary | ICD-10-CM | POA: Diagnosis not present

## 2024-11-11 DIAGNOSIS — I10 Essential (primary) hypertension: Secondary | ICD-10-CM | POA: Diagnosis not present

## 2024-11-11 DIAGNOSIS — I5022 Chronic systolic (congestive) heart failure: Secondary | ICD-10-CM | POA: Diagnosis not present

## 2024-11-11 DIAGNOSIS — Z9884 Bariatric surgery status: Secondary | ICD-10-CM | POA: Insufficient documentation

## 2024-11-11 LAB — BASIC METABOLIC PANEL WITH GFR
Anion gap: 12 (ref 5–15)
BUN: 24 mg/dL — ABNORMAL HIGH (ref 6–20)
CO2: 26 mmol/L (ref 22–32)
Calcium: 9.3 mg/dL (ref 8.9–10.3)
Chloride: 103 mmol/L (ref 98–111)
Creatinine, Ser: 1.76 mg/dL — ABNORMAL HIGH (ref 0.61–1.24)
GFR, Estimated: 45 mL/min — ABNORMAL LOW (ref >60)
Glucose, Bld: 94 mg/dL (ref 70–99)
Potassium: 3.9 mmol/L (ref 3.5–5.1)
Sodium: 141 mmol/L (ref 135–145)

## 2024-11-11 LAB — BRAIN NATRIURETIC PEPTIDE: B Natriuretic Peptide: 107.9 pg/mL — ABNORMAL HIGH (ref 0.0–100.0)

## 2024-11-11 MED ORDER — SACUBITRIL-VALSARTAN 49-51 MG PO TABS: 1.0000 | ORAL_TABLET | Freq: Two times a day (BID) | ORAL | 3 refills | Status: AC

## 2024-11-11 NOTE — Progress Notes (Addendum)
 HEART FAILURE CLINIC CONSULT NOTE  Referring Physician: Fairy Frames, MD PCP: [name] ORN, MD  Cardiologist: Darryle ONEIDA Decent, MD  Chief complaint: HF  HPI:  Raymond Ryan is a 58 y.o. male with history of obesity s/p gastric bypass on tizepatide, CKD 3a, T2DM, and HTN. Now with new HFrEF.   Admitted to [facility] 10/02/24 with chest pain and dyspnea. CTPE negative. Found to have new systolic heart failure. EF 30-35% with RWMA and a moderate pericardial effusion. R/LHC with elevated filling pressures w/o obstructive coronary disease. 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 He was started on GDMT and cardiac PET scan as well as genetic testing reccomended in OP.   PET SCAN 11/25 EF 30% no sarcoid   Echo 10/25 EF 25-30% small effusion   Here today to establish care in the HF Clinic. Very frustrated. 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 Gets winded if walks to far or goes up steps. No CP. Edema has resolved with lasix. Compliant with meds   Cardiac Testing:  - CMR 10/25: LVEF 27%, RVEF 29%, mod MR, NICM LGE pattern in 12% LV myocardium, LGE also with triangle sign consistent with sarcoidosis. - R/LHC: nonobstructive CAD, NICM, RA 9, pa 58/28 (42), pcw 21, co/ci 6.6/2.8 - Echo 10/25: EF 30-35%, G3DD, nl RV, mod pericardial effusion  Past Medical History:  Diagnosis Date   Charcot foot due to diabetes mellitus (HCC)    Chronic kidney disease    Constipation    sometimes soft, some hard -- does go daily - are on fiber    Diabetes mellitus    GERD (gastroesophageal reflux disease)    Headache    Hyperlipidemia    Hypertension    Neuromuscular disorder (HCC)    neuropathy    Current Outpatient Medications  Medication Sig Dispense Refill   Accu-Chek Softclix Lancets lancets Use as instructed 100 each 12   atorvastatin  (LIPITOR) 40 MG tablet Take 1 tablet (40 mg total)  by mouth daily. 90 tablet 3   empagliflozin (JARDIANCE) 10 MG TABS tablet Take 1 tablet (10 mg total) by mouth daily. 30 tablet 0   furosemide (LASIX) 20 MG tablet Take 1 tablet (20 mg total) by mouth daily as needed for fluid or edema. 30 tablet 3   gabapentin  (NEURONTIN ) 300 MG capsule TAKE 1 CAPSULE (300MG  TOTAL) BY MOUTH AT BEDTIME AS NEEDED 90 capsule 0   glucose blood (ACCU-CHEK GUIDE TEST) test strip Use to test blood glucose before meals and at bedtime 300 each 12   metFORMIN  (GLUCOPHAGE -XR) 500 MG 24 hr tablet Take 1 tablet (500 mg total) by mouth daily with supper. 90 tablet 3   metoprolol succinate (TOPROL-XL) 25 MG 24 hr tablet Take 1 tablet (25 mg total) by mouth daily. Take with or immediately following a meal. 30 tablet 5   Multiple Vitamins-Minerals (CENTRUM SILVER MEN 50+ PO) Take 1 tablet by mouth daily.     sacubitril-valsartan (ENTRESTO) 24-26 MG Take 1 tablet by mouth 2 (two) times daily. 60 tablet 0   spironolactone (ALDACTONE) 25 MG tablet Take 0.5 tablets (12.5 mg total) by mouth daily. 30 tablet 0   tirzepatide  (MOUNJARO ) 15 MG/0.5ML Pen Inject 15 mg into the skin once a week. 6 mL 4   tamsulosin  (FLOMAX ) 0.4 MG CAPS capsule Take 1 capsule (0.4 mg total) by mouth daily. (Patient not taking:  Reported on 11/11/2024) 90 capsule 3   No current facility-administered medications for this encounter.    No Known Allergies    Social History   Socioeconomic History   Marital status: Single    Spouse name: Not on file   Number of children: 1   Years of education: 12   Highest education level: Not on file  Occupational History   Not on file  Tobacco Use   Smoking status: Never   Smokeless tobacco: Never  Vaping Use   Vaping status: Never Used  Substance and Sexual Activity   Alcohol use: No   Drug use: No   Sexual activity: Not on file  Other Topics Concern   Not on file  Social History Narrative   Not on file   Social Drivers of Health   Financial Resource  Strain: Low Risk  (10/05/2024)   Overall Financial Resource Strain (CARDIA)    Difficulty of Paying Living Expenses: Not very hard  Food Insecurity: No Food Insecurity (10/11/2024)   Hunger Vital Sign    Worried About Running Out of Food in the Last Year: Never true    Ran Out of Food in the Last Year: Never true  Transportation Needs: No Transportation Needs (10/11/2024)   PRAPARE - Administrator, Civil Service (Medical): No    Lack of Transportation (Non-Medical): No  Physical Activity: Not on file  Stress: Not on file  Social Connections: Not on file  Intimate Partner Violence: Not At Risk (10/11/2024)   Humiliation, Afraid, Rape, and Kick questionnaire    Fear of Current or Ex-Partner: No    Emotionally Abused: No    Physically Abused: No    Sexually Abused: No      Family History  Problem Relation Age of Onset   Diabetes Mellitus II Mother    CAD Mother    Prostate cancer Father    Prostate cancer Maternal Uncle    Prostate cancer Maternal Uncle    Colon cancer Neg Hx    Colon polyps Neg Hx    Blood pressure 128/80, pulse 90, weight 106.5 kg (234 lb 12.8 oz), SpO2 99%.  Filed Weights   11/11/24 1109  Weight: 106.5 kg (234 lb 12.8 oz)   PHYSICAL EXAM: General:  Sitting up. No resp difficulty HEENT: normal Neck: supple. no JVD.  Cor: Regular rate & rhythm. No rubs, gallops or murmurs. Lungs: clear Abdomen: obese soft, nontender, nondistended.Good bowel sounds. Extremities: no cyanosis, clubbing, rash, edema Neuro: alert & orientedx3, cranial nerves grossly intact. moves all 4 extremities w/o difficulty. Affect pleasant  ECG (personally reviewed): n/a  ReDs reading: 29%  ASSESSMENT & PLAN:  Chronic Biventricular HFrEF; NICM - Echo 10/25 with EF 30-35%, G3DD, and normal RV  (new onset) - R/LHC 10/25: No CAD; RA 9, PA 58/28 (42), PCW 21, CO/CI 6.6/2.8 - CMR 10/25: LVEF 27%, RVEF 29%, LGE with triangle sign concerning for cardiac sarcoid - cPET  11/25 EF 30% no sarcoid  - Doubt sarcoid cause of CM. ?HTN, If EF not improving with GDMT will need ICD eval and genetic testing - NYHA II-III  - volume ok  - GDMT: - ? blocker:Toprol 25 - ARB/ARNI: Increase entresto 24/26 -> 49/51 mg bid - MRA: continue spiro 12.5 - SGLT2i: continue jardiance 10 mg daily - Labs today  Pericardial effusion - mod on 10/25 echo - resolved  HTN - BP ok.  - GDMT as above   H/o Obesity - s/p gastric bypass sx -  on tirzepatide  - Body mass index is 28.58 kg/m.  AKI on CKD 3a - baseline Cr fluctuant ~1.3-1.6; last 1.99 - on SGLT2i - labs today  T2DM - well controlled; A1C 6.1  Toribio Fuel, MD 11/11/24

## 2024-11-11 NOTE — Patient Instructions (Signed)
 Medication Changes:  INCREASE ENTRESTO TO 49/51MG  TWICE DAILY   Lab Work:  Labs done today, your results will be available in MyChart, we will contact you for abnormal readings.  Referrals:  YOU HAVE BEEN REFERRED TO CARDIAC REHAB THEY WILL REACH OUT TO YOU OR CALL TO ARRANGE THIS. PLEASE CALL US  WITH ANY CONCERNS   Follow-Up in: 4-6 WEEKS WITH APP AS SCHEDULED   At the [facility], you and your health needs are our priority. We have a designated team specialized in the treatment of Heart Failure. This Care Team includes your primary Heart Failure Specialized Cardiologist (physician), Advanced Practice Providers (APPs- Physician Assistants and Nurse Practitioners), and Pharmacist who all work together to provide you with the care you need, when you need it.   You may see any of the following providers on your designated Care Team at your next follow up:  Dr. Toribio Fuel Dr. Ezra Shuck Dr. Odis Brownie Greig Mosses, NP Caffie Shed, [place] [facility] [place], [place] Beckey Coe, NP Jordan Lee, NP Tinnie Redman, PharmD   Please be sure to bring in all your medications bottles to every appointment.   Need to Contact Us :  If you have any questions or concerns before your next appointment please send us  a message through [place] or call our office at [phone].    TO LEAVE A MESSAGE FOR THE NURSE SELECT OPTION 2, PLEASE LEAVE A MESSAGE INCLUDING: YOUR NAME DATE OF BIRTH CALL BACK NUMBER REASON FOR CALL**this is important as we prioritize the call backs  YOU WILL RECEIVE A CALL BACK THE SAME DAY AS LONG AS YOU CALL BEFORE 4:00 PM

## 2024-11-11 NOTE — Progress Notes (Signed)
 ReDS Vest / Clip - 11/11/24 1200       ReDS Vest / Clip   Station Marker D    Ruler Value 34    ReDS Value Range Low volume    ReDS Actual Value 29

## 2024-11-12 ENCOUNTER — Telehealth (HOSPITAL_COMMUNITY): Payer: Self-pay

## 2024-11-12 NOTE — Telephone Encounter (Signed)
 Called patient and made him aware that disability forms have been filled out and faxed to requesting office.   Copy of forms left at front desk for pick up.   Patient aware and verbalize understanding.

## 2024-11-17 ENCOUNTER — Telehealth (HOSPITAL_COMMUNITY): Payer: Self-pay

## 2024-11-17 NOTE — Telephone Encounter (Signed)
 Pt insurance is active and benefits verified through [employer] Co-pay 0, DED $1,250/$1,250 met, out of pocket $4,890/$4,890 met, co-insurance 20%. no pre-authorization required, Jay/[employer] 11/17/2024@1 :35, REF# [id number]   TCR/ICR? ICR Visit(date of service)limitation? 72 units Can multiple codes be used on the same date of service/visit?(IF ITS A LIMIT) No     Is this a lifetime maximum or an annual maximum? Per 3 years Has the member used any of these services to date? no Is there a time limit (weeks/months) on start of program and/or program completion? no   Will contact patient to see if he is interested in the Cardiac Rehab Program. If interested, patient will need to complete follow up appt. Once completed, patient will be contacted for scheduling upon review by the RN Navigator.

## 2024-11-17 NOTE — Telephone Encounter (Signed)
Called patient to see if he is interested in the Cardiac Rehab Program. Patient expressed interest. Explained scheduling process and went over insurance, patient verbalized understanding. Will contact patient for scheduling once he has been reviewed.

## 2024-11-24 ENCOUNTER — Telehealth (HOSPITAL_COMMUNITY): Payer: Self-pay | Admitting: Vascular Surgery

## 2024-11-24 NOTE — Telephone Encounter (Signed)
 [employer] fax [phone]  or [phone] request medical records

## 2024-11-29 ENCOUNTER — Telehealth (HOSPITAL_COMMUNITY): Payer: Self-pay

## 2024-11-29 NOTE — Telephone Encounter (Signed)
 Patient called back to get scheduled in the Cardiac Rehab Program. Patient will come in for orientation on 12/23 and will attend the 8:15 exercise class.  Pensions consultant.

## 2024-11-29 NOTE — Telephone Encounter (Signed)
 Attempted to schedule cardiac rehab- no answer, left message. Sent MyChart message.

## 2024-11-30 ENCOUNTER — Other Ambulatory Visit (HOSPITAL_COMMUNITY): Payer: Self-pay | Admitting: Cardiology

## 2024-11-30 MED ORDER — SPIRONOLACTONE 25 MG PO TABS: 12.5000 mg | ORAL_TABLET | Freq: Every day | ORAL | 6 refills | Status: AC

## 2024-11-30 MED ORDER — EMPAGLIFLOZIN 10 MG PO TABS: 10.0000 mg | ORAL_TABLET | Freq: Every day | ORAL | 6 refills | Status: AC

## 2024-12-03 ENCOUNTER — Telehealth (HOSPITAL_COMMUNITY): Payer: Self-pay

## 2024-12-03 NOTE — Telephone Encounter (Signed)
 Received a fax requesting medical records from [employer]. Records were successfully faxed to: [phone] ,which was the number provided.. Medical request form will be scanned into patients chart.   Phone number: [phone]

## 2024-12-16 ENCOUNTER — Telehealth (HOSPITAL_COMMUNITY): Payer: Self-pay

## 2024-12-16 NOTE — Telephone Encounter (Signed)
 Attempted to confirm cardiac rehab orientation date of 12/21/24 @ 0800.  Location, what to bring, footwear, and approximate length of orientation given in message.

## 2024-12-20 ENCOUNTER — Telehealth (HOSPITAL_COMMUNITY): Payer: Self-pay

## 2024-12-20 ENCOUNTER — Telehealth: Payer: Self-pay

## 2024-12-20 NOTE — Telephone Encounter (Signed)
 Called to confirm/remind patient of their appointment at the [facility] on 10/21/24.   Appointment:   [x] Confirmed  [] Left mess   [] No answer/No voice mail  [] VM Full/unable to leave message  [] Phone not in service  Patient reminded to bring all medications and/or complete list.  Confirmed patient has transportation. Gave directions, instructed to utilize valet parking.

## 2024-12-20 NOTE — Telephone Encounter (Signed)
 LVM for pt to confirm Cardiac Rehab Orientation appointment and complete nursing eval prior to appt.

## 2024-12-21 ENCOUNTER — Ambulatory Visit (HOSPITAL_COMMUNITY): Payer: Self-pay | Admitting: Adult Health

## 2024-12-21 ENCOUNTER — Encounter (HOSPITAL_COMMUNITY)
Admission: RE | Admit: 2024-12-21 | Discharge: 2024-12-21 | Disposition: A | Discharge status: Home / Self Care | Source: Ambulatory Visit | Attending: Internal Medicine | Admitting: Internal Medicine

## 2024-12-21 ENCOUNTER — Ambulatory Visit (HOSPITAL_COMMUNITY)
Admission: RE | Admit: 2024-12-21 | Discharge: 2024-12-21 | Disposition: A | Discharge status: Home / Self Care | Source: Ambulatory Visit | Attending: Adult Health | Admitting: Adult Health

## 2024-12-21 ENCOUNTER — Encounter (HOSPITAL_COMMUNITY): Payer: Self-pay

## 2024-12-21 ENCOUNTER — Ambulatory Visit (HOSPITAL_COMMUNITY)
Admission: RE | Admit: 2024-12-21 | Discharge: 2024-12-21 | Disposition: A | Discharge status: Home / Self Care | Source: Ambulatory Visit | Attending: Internal Medicine | Admitting: Internal Medicine

## 2024-12-21 VITALS — BP 110/70 | HR 107 | Ht 75.75 in | Wt 243.2 lb

## 2024-12-21 VITALS — BP 118/78 | HR 114 | Wt 241.4 lb

## 2024-12-21 DIAGNOSIS — Z8249 Family history of ischemic heart disease and other diseases of the circulatory system: Secondary | ICD-10-CM | POA: Insufficient documentation

## 2024-12-21 DIAGNOSIS — N1831 Chronic kidney disease, stage 3a: Secondary | ICD-10-CM | POA: Insufficient documentation

## 2024-12-21 DIAGNOSIS — I10 Essential (primary) hypertension: Secondary | ICD-10-CM | POA: Diagnosis not present

## 2024-12-21 DIAGNOSIS — R55 Syncope and collapse: Secondary | ICD-10-CM | POA: Diagnosis not present

## 2024-12-21 DIAGNOSIS — I5022 Chronic systolic (congestive) heart failure: Secondary | ICD-10-CM | POA: Diagnosis not present

## 2024-12-21 DIAGNOSIS — Z7984 Long term (current) use of oral hypoglycemic drugs: Secondary | ICD-10-CM | POA: Insufficient documentation

## 2024-12-21 DIAGNOSIS — Z79899 Other long term (current) drug therapy: Secondary | ICD-10-CM | POA: Diagnosis not present

## 2024-12-21 DIAGNOSIS — I130 Hypertensive heart and chronic kidney disease with heart failure and stage 1 through stage 4 chronic kidney disease, or unspecified chronic kidney disease: Secondary | ICD-10-CM | POA: Diagnosis present

## 2024-12-21 DIAGNOSIS — Z7985 Long-term (current) use of injectable non-insulin antidiabetic drugs: Secondary | ICD-10-CM | POA: Diagnosis not present

## 2024-12-21 DIAGNOSIS — Z9884 Bariatric surgery status: Secondary | ICD-10-CM | POA: Insufficient documentation

## 2024-12-21 DIAGNOSIS — E1122 Type 2 diabetes mellitus with diabetic chronic kidney disease: Secondary | ICD-10-CM | POA: Insufficient documentation

## 2024-12-21 LAB — GLUCOSE, CAPILLARY: Glucose-Capillary: 122 mg/dL — ABNORMAL HIGH (ref 70–99)

## 2024-12-21 MED ORDER — METOPROLOL SUCCINATE ER 25 MG PO TB24: 25.0000 mg | ORAL_TABLET | Freq: Two times a day (BID) | ORAL | 5 refills | Status: AC

## 2024-12-21 NOTE — Progress Notes (Signed)
" °  Heart and Vascular Care Navigation  12/21/2024  Zalman Hull [birth year] [id number]  Reason for Referral: Disability questions/ copay cost concerns   Engaged with patient face to face for initial visit for Heart and Vascular Care Coordination.                                                                                                   Assessment: CSW met with Raymond Ryan to discuss concerns with income.  Has stopped work due to health- is working on applying for STD but believes he will not be able to return to work long term.  Provided with information about SSA and how to apply for disability.  Raymond Ryan also concerned about copays for cardiac rehab.  Raymond Ryan states he will get $138/week from STD and has less than $2000 in savings so should qualify for Medicaid- sent across the street to [facility] to apply for Medicaid.  If approved would cover most of copays for cardiac rehab.  Also provided CAFA for hardship program as he reports over $5000 in outstanding bills.                                     HRT/VAS Care Coordination     Home Assistive Devices/Equipment CBG Meter       Social History:                                                                             SDOH Screenings   Food Insecurity: No Food Insecurity (10/11/2024)  Housing: Unknown (10/11/2024)  Transportation Needs: No Transportation Needs (10/11/2024)  Utilities: Not At Risk (10/11/2024)  Alcohol Screen: Low Risk (10/05/2024)  Depression (PHQ2-9): Low Risk (12/21/2024)  Financial Resource Strain: High Risk (12/21/2024)  Tobacco Use: Low Risk (12/21/2024)    SDOH Interventions: Financial Resources:  Surveyor, Quantity Strain Interventions: [employer] Provided Applying for STD and provided info on applying for [employer] Insecurity:  None reported  Housing Insecurity:  None reported  Transportation:   None reported     Follow-up plan:    CSW will plan to see patient at next clinic visit to check in  and assist as able  Andriette HILARIO Leech, LCSW Clinical Social Worker [facility] Desk#: [phone] Cell#: [phone]     "

## 2024-12-21 NOTE — Progress Notes (Signed)
 Zio patch placed onto patient.  All instructions and information reviewed with patient, they verbalize understanding with no questions.

## 2024-12-21 NOTE — Patient Instructions (Signed)
 Medication Changes:  INCREASE METOPROLOL  SUCCINATE TO 25MG  TWICE DAILY   Testing/Procedures:  Your provider has recommended that  you wear a Zio Patch for 14 days.  This monitor will record your heart rhythm for our review.  IF you have any symptoms while wearing the monitor please press the button.  If you have any issues with the patch or you notice a red or orange light on it please call the company at [phone].  Once you remove the patch please mail it back to the company as soon as possible so we can get the results.  Follow-Up in: 4 weeks as scheduled with APP clinic   At the [facility], you and your health needs are our priority. We have a designated team specialized in the treatment of Heart Failure. This Care Team includes your primary Heart Failure Specialized Cardiologist (physician), Advanced Practice Providers (APPs- Physician Assistants and Nurse Practitioners), and Pharmacist who all work together to provide you with the care you need, when you need it.   You may see any of the following providers on your designated Care Team at your next follow up:  Dr. Toribio Fuel Dr. Ezra Shuck Dr. Odis Brownie Greig Mosses, NP Caffie Shed, [place] [facility] [place], [place] Beckey Coe, NP Jordan Lee, NP Tinnie Redman, PharmD  Please be sure to bring in all your medications bottles to every appointment.   Need to Contact Us :  If you have any questions or concerns before your next appointment please send us  a message through [place] or call our office at [phone].    TO LEAVE A MESSAGE FOR THE NURSE SELECT OPTION 2, PLEASE LEAVE A MESSAGE INCLUDING: YOUR NAME DATE OF BIRTH CALL BACK NUMBER REASON FOR CALL**this is important as we prioritize the call backs  YOU WILL RECEIVE A CALL BACK THE SAME DAY AS LONG AS YOU CALL BEFORE 4:00 PM

## 2024-12-21 NOTE — Progress Notes (Signed)
 "    HEART FAILURE CLINIC CONSULT NOTE  Referring Physician: Fairy Frames, MD PCP: [name] ORN, MD  Cardiologist: Darryle ONEIDA Decent, MD  Chief complaint: HF  HPI: Raymond Ryan is a 58 y.o. male with history of obesity s/p gastric bypass on tizepatide, CKD 3a, T2DM, HTN, and chronic HFrEF.    Admitted to [facility] 10/02/24 with chest pain and dyspnea. CTPE negative. Found to have new systolic heart failure. EF 30-35% with RWMA and a moderate pericardial effusion. R/LHC with elevated filling pressures w/o obstructive coronary disease. 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 He was started on GDMT and cardiac PET scan as well as genetic testing reccomended in OP.   Today he returns for HF follow up.He had syncopal episode 2 weeks ago. He was sitting on the cost and got up fast and he got lightheaded then passed out. He woke up on the floor in the bedroom. Overall feeling fine. Denies SOB/PND/Orthopnea. Appetite ok. No fever or chills. Weight at home  223-227. pounds. Taking all medications. Previously worked as surveyor, mining but out with heart condition.   Cardiac Testing:  - CMR 10/25: LVEF 27%, RVEF 29%, mod MR, NICM LGE pattern in 12% LV myocardium, LGE also with triangle sign consistent with sarcoidosis. - R/LHC: nonobstructive CAD, NICM, RA 9, pa 58/28 (42), pcw 21, co/ci 6.6/2.8 - Echo 10/25: EF 30-35%, G3DD, nl RV, mod pericardial effusion -PET SCAN 11/25 EF 30% no sarcoid   Past Medical History:  Diagnosis Date   Charcot foot due to diabetes mellitus (HCC)    Chronic kidney disease    Constipation    sometimes soft, some hard -- does go daily - are on fiber    Diabetes mellitus    GERD (gastroesophageal reflux disease)    Headache    Hyperlipidemia    Hypertension    Neuromuscular disorder (HCC)    neuropathy    Current Outpatient Medications  Medication Sig Dispense Refill   Accu-Chek Softclix Lancets lancets Use as  instructed 100 each 12   atorvastatin  (LIPITOR) 40 MG tablet Take 1 tablet (40 mg total) by mouth daily. 90 tablet 3   empagliflozin  (JARDIANCE ) 10 MG TABS tablet Take 1 tablet (10 mg total) by mouth daily. 30 tablet 6   furosemide  (LASIX ) 20 MG tablet Take 1 tablet (20 mg total) by mouth daily as needed for fluid or edema. 30 tablet 3   gabapentin  (NEURONTIN ) 300 MG capsule TAKE 1 CAPSULE (300MG  TOTAL) BY MOUTH AT BEDTIME AS NEEDED 90 capsule 0   glucose blood (ACCU-CHEK GUIDE TEST) test strip Use to test blood glucose before meals and at bedtime 300 each 12   metFORMIN  (GLUCOPHAGE -XR) 500 MG 24 hr tablet Take 1 tablet (500 mg total) by mouth daily with supper. 90 tablet 3   metoprolol  succinate (TOPROL -XL) 25 MG 24 hr tablet Take 1 tablet (25 mg total) by mouth daily. Take with or immediately following a meal. 30 tablet 5   Multiple Vitamins-Minerals (CENTRUM SILVER MEN 50+ PO) Take 1 tablet by mouth daily.     sacubitril -valsartan  (ENTRESTO ) 49-51 MG Take 1 tablet by mouth 2 (two) times daily. 60 tablet 3   spironolactone  (ALDACTONE ) 25 MG tablet Take 0.5 tablets (12.5 mg total) by mouth daily. 30 tablet 6   tirzepatide  (MOUNJARO ) 15 MG/0.5ML Pen Inject 15 mg into the skin once a week. 6 mL 4   tamsulosin  (FLOMAX ) 0.4 MG CAPS capsule Take  1 capsule (0.4 mg total) by mouth daily. (Patient not taking: Reported on 12/21/2024) 90 capsule 3   No current facility-administered medications for this encounter.    No Known Allergies    Social History   Socioeconomic History   Marital status: Single    Spouse name: Not on file   Number of children: 1   Years of education: 12   Highest education level: Not on file  Occupational History   Not on file  Tobacco Use   Smoking status: Never   Smokeless tobacco: Never  Vaping Use   Vaping status: Never Used  Substance and Sexual Activity   Alcohol use: No   Drug use: No   Sexual activity: Not on file  Other Topics Concern   Not on file   Social History Narrative   Not on file   Social Drivers of Health   Tobacco Use: Low Risk (12/21/2024)   Patient History    Smoking Tobacco Use: Never    Smokeless Tobacco Use: Never    Passive Exposure: Not on file  Financial Resource Strain: Low Risk (10/05/2024)   Overall Financial Resource Strain (CARDIA)    Difficulty of Paying Living Expenses: Not very hard  Food Insecurity: No Food Insecurity (10/11/2024)   Epic    Worried About Programme Researcher, Broadcasting/film/video in the Last Year: Never true    Ran Out of Food in the Last Year: Never true  Transportation Needs: No Transportation Needs (10/11/2024)   Epic    Lack of Transportation (Medical): No    Lack of Transportation (Non-Medical): No  Physical Activity: Not on file  Stress: Not on file  Social Connections: Not on file  Intimate Partner Violence: Not At Risk (10/11/2024)   Epic    Fear of Current or Ex-Partner: No    Emotionally Abused: No    Physically Abused: No    Sexually Abused: No  Depression (PHQ2-9): Low Risk (12/21/2024)   Depression (PHQ2-9)    PHQ-2 Score: 0  Alcohol Screen: Low Risk (10/05/2024)   Alcohol Screen    Last Alcohol Screening Score (AUDIT): 0  Housing: Unknown (10/11/2024)   Epic    Unable to Pay for Housing in the Last Year: No    Number of Times Moved in the Last Year: Not on file    Homeless in the Last Year: No  Utilities: Not At Risk (10/11/2024)   Epic    Threatened with loss of utilities: No  Health Literacy: Not on file      Family History  Problem Relation Age of Onset   Diabetes Mellitus II Mother    CAD Mother    Prostate cancer Father    Prostate cancer Maternal Uncle    Prostate cancer Maternal Uncle    Colon cancer Neg Hx    Colon polyps Neg Hx    Blood pressure 118/78, pulse (!) 114, weight 109.5 kg (241 lb 6.4 oz), SpO2 99%.  Filed Weights   12/21/24 0937  Weight: 109.5 kg (241 lb 6.4 oz)    PHYSICAL EXAM: General:   No resp difficulty Neck: no JVD.  Cor: Regular  rate & rhythm.  Lungs: clear Abdomen: soft, nontender, nondistended.  Extremities: no  edema Neuro: alert & oriented x3  EKG: Sinus Tach 118 QRS 86 ms  ReDs reading: 36 %, normal    ASSESSMENT & PLAN:  Chronic Biventricular HFrEF; NICM - Echo 10/25 with EF 30-35%, G3DD, and normal RV  (new onset) - R/LHC 10/25:  No CAD; RA 9, PA 58/28 (42), PCW 21, CO/CI 6.6/2.8 - CMR 10/25: LVEF 27%, RVEF 29%, LGE with triangle sign concerning for cardiac sarcoid - cPET 11/25 EF 30% no sarcoid  - Doubt sarcoid cause of CM. ?HTN, If EF not improving with GDMT will need ICD eval and genetic testing -NYHA II. Volume status stable. Continue lasix  as needed. Reds reading 36%.  - GDMT: - ? blocker: Increase Toprol  XL to 25 mg twice a day 25 mg daily. We discussed purpose.  - ARB/ARNI: Continue entresto  49/51 mg bid - MRA: continue spiro 12.5, continue current dose. Recent creatinine 1.8 - SGLT2i: continue jardiance  10 mg daily -Repeat ECHO In February. If EF remains < 35% will refer to EP for ICD. Narrow QRS, CRTD not indicated.   Pericardial effusion - mod on 10/25 echo - Small pericardial effusion 10/29/205 resolved  HTN -Stable Cotninue current regimen   H/o Obesity - s/p gastric bypass sx - on tirzepatide   CKD 3a - baseline Cr fluctuant ~1.3-1.6; last 1.8  - on SGLT2i  T2DM - well controlled; A1C 6.1  Sinus Tach Increase Toprol  XL 25 mg twice a day  Syncope Unwitnessed. Lightheaded in a seated position then collapsed when standing. ?Orthostatic. EKG ok today. No arrhythmias.  Place 2 weeks Zio Monitor.    Follow up in 3-4 weeks APP.  Follow up in 2 months with Dr Cherrie  and an ECHO Provided with work letter.     Greig Mosses, NP 12/21/2024  "

## 2024-12-21 NOTE — Progress Notes (Signed)
"   ReDS Vest / Clip - 12/21/24 1000       ReDS Vest / Clip   Station Marker D    Ruler Value 37    ReDS Value Range Moderate volume overload    ReDS Actual Value 36          "

## 2024-12-21 NOTE — Progress Notes (Addendum)
 Cardiac Rehab Medication Review by a Nurse  Does the patient  feel that his/her medications are working for him/her?  yes  Has the patient been experiencing any side effects to the medications prescribed?  no  Does the patient measure his/her own blood pressure or blood glucose at home?  yes   Does the patient have any problems obtaining medications due to transportation or finances?   no  Understanding of regimen: good Understanding of indications: good Potential of compliance: good    Nurse comments: Raymond Ryan is taking his medications as prescribed and has a good understanding of what his medications are for. Raymond Ryan does not have a BP/ cuff at home. Raymond Ryan says he checks his blood pressures twice a day.    Hadassah Gaw [facility] RN 12/21/2024 8:17 AM

## 2024-12-21 NOTE — Progress Notes (Signed)
 Cardiac Individual Treatment Plan  Patient Details  Name: Raymond Ryan MRN: [id number] Date of Birth: August 11, 1966 Referring Provider:   Flowsheet Row INTENSIVE CARDIAC REHAB ORIENT from 12/21/2024 in [facility]  Referring Provider Cherrie Toribio SAUNDERS, MD    Initial Encounter Date:  Flowsheet Row INTENSIVE CARDIAC REHAB ORIENT from 12/21/2024 in [facility]  Date 12/21/24    Visit Diagnosis: Heart failure, chronic systolic (HCC)  Patient's Home Medications on Admission: Current Medications[1]  Past Medical History: Past Medical History:  Diagnosis Date   Charcot foot due to diabetes mellitus (HCC)    CHF (congestive heart failure) (HCC)    Chronic kidney disease    Constipation    sometimes soft, some hard -- does go daily - are on fiber    Diabetes mellitus    GERD (gastroesophageal reflux disease)    Headache    Hyperlipidemia    Hypertension    Neuromuscular disorder (HCC)    neuropathy    Tobacco Use: Tobacco Use History[2]  Labs: Review Flowsheet  More data exists      Latest Ref Rng & Units 03/04/2024 06/16/2024 10/02/2024 10/03/2024 10/04/2024  Labs for ITP Cardiac and Pulmonary Rehab  Cholestrol 0 - 200 mg/dL - - - 838  -  LDL (calc) 0 - 99 mg/dL - - - 897  -  HDL-C >59 mg/dL - - - 42  -  Trlycerides <150 mg/dL - - - 84  -  Hemoglobin A1c 4.8 - 5.6 % 7.5  7.5  6.1  - -  PH, Arterial 7.35 - 7.45 - - - - 7.445   PCO2 arterial 32 - 48 mmHg - - - - 37.2   Bicarbonate 20.0 - 28.0 mmol/L - - - - 27.1  25.6  22.9   TCO2 22 - 32 mmol/L - - 21  - 28  27  24    Acid-base deficit 0.0 - 2.0 mmol/L - - - - 3.0   O2 Saturation % - - - - 65  94  66     Details       Multiple values from one day are sorted in reverse-chronological order         Capillary Blood Glucose: Lab Results  Component Value Date   GLUCAP 122 (H) 12/21/2024   GLUCAP 126  (H) 10/06/2024   GLUCAP 128 (H) 10/06/2024   GLUCAP 130 (H) 10/05/2024   GLUCAP 103 (H) 10/05/2024     Exercise Target Goals: Exercise Program Goal: Individual exercise prescription set using results from initial 6 min walk test and THRR while considering  patients activity barriers and safety.   Exercise Prescription Goal: Initial exercise prescription builds to 30-45 minutes a day of aerobic activity, 2-3 days per week.  Home exercise guidelines will be given to patient during program as part of exercise prescription that the participant will acknowledge.  Activity Barriers & Risk Stratification:  Activity Barriers & Cardiac Risk Stratification - 12/21/24 0851       Activity Barriers & Cardiac Risk Stratification   Activity Barriers History of Falls   Fall due to syncopal episode   Cardiac Risk Stratification High          6 Minute Walk:  6 Minute Walk     Row Name 12/21/24 0904         6 Minute Walk   Phase Initial  Distance 1522 feet     Walk Time 6 minutes     # of Rest Breaks 0     MPH 2.88     METS 4.14     RPE 9     Perceived Dyspnea  0     VO2 Peak 14.5     Symptoms No     Resting HR 107 bpm     Resting BP 110/70     Resting Oxygen Saturation  99 %     Exercise Oxygen Saturation  during 6 min walk 100 %     Max Ex. HR 128 bpm     Max Ex. BP 122/78     2 Minute Post BP 120/86        Oxygen Initial Assessment:   Oxygen Re-Evaluation:   Oxygen Discharge (Final Oxygen Re-Evaluation):   Initial Exercise Prescription:  Initial Exercise Prescription - 12/21/24 0900       Date of Initial Exercise RX and Referring Provider   Date 12/21/24    Referring Provider Cherrie Toribio SAUNDERS, MD    Expected Discharge Date 03/16/25      Bike   Level 1.5    Watts 35    Minutes 15    METs 2.5      Recumbant Elliptical   Level 1    RPM 40    Watts 35    Minutes 15    METs 2.5      Prescription Details   Frequency (times per week) 2-3     Duration Progress to 30 minutes of continuous aerobic without signs/symptoms of physical distress      Intensity   THRR 40-80% of Max Heartrate 65-131    Ratings of Perceived Exertion 11-13    Perceived Dyspnea 0-4      Progression   Progression Continue to progress workloads to maintain intensity without signs/symptoms of physical distress.      Resistance Training   Training Prescription Yes    Weight 4 lbs    Reps 10-15          Perform Capillary Blood Glucose checks as needed.  Exercise Prescription Changes:   Exercise Comments:   Exercise Goals and Review:   Exercise Goals     Row Name 12/21/24 0841             Exercise Goals   Increase Physical Activity Yes       Intervention Provide advice, education, support and counseling about physical activity/exercise needs.;Develop an individualized exercise prescription for aerobic and resistive training based on initial evaluation findings, risk stratification, comorbidities and participant's personal goals.       Expected Outcomes Short Term: Attend rehab on a regular basis to increase amount of physical activity.;Long Term: Add in home exercise to make exercise part of routine and to increase amount of physical activity.;Long Term: Exercising regularly at least 3-5 days a week.       Increase Strength and Stamina Yes       Intervention Provide advice, education, support and counseling about physical activity/exercise needs.;Develop an individualized exercise prescription for aerobic and resistive training based on initial evaluation findings, risk stratification, comorbidities and participant's personal goals.       Expected Outcomes Short Term: Increase workloads from initial exercise prescription for resistance, speed, and METs.;Short Term: Perform resistance training exercises routinely during rehab and add in resistance training at home;Long Term: Improve cardiorespiratory fitness, muscular endurance and strength as  measured by increased METs and functional  capacity ( )       Able to understand and use rate of perceived exertion (RPE) scale Yes       Intervention Provide education and explanation on how to use RPE scale       Expected Outcomes Short Term: Able to use RPE daily in rehab to express subjective intensity level;Long Term:  Able to use RPE to guide intensity level when exercising independently       Knowledge and understanding of Target Heart Rate Range (THRR) Yes       Intervention Provide education and explanation of THRR including how the numbers were predicted and where they are located for reference       Expected Outcomes Short Term: Able to state/look up THRR;Short Term: Able to use daily as guideline for intensity in rehab;Long Term: Able to use THRR to govern intensity when exercising independently       Able to check pulse independently Yes       Intervention Provide education and demonstration on how to check pulse in carotid and radial arteries.;Review the importance of being able to check your own pulse for safety during independent exercise       Expected Outcomes Short Term: Able to explain why pulse checking is important during independent exercise;Long Term: Able to check pulse independently and accurately       Understanding of Exercise Prescription Yes       Intervention Provide education, explanation, and written materials on patient's individual exercise prescription       Expected Outcomes Short Term: Able to explain program exercise prescription;Long Term: Able to explain home exercise prescription to exercise independently          Exercise Goals Re-Evaluation :   Discharge Exercise Prescription (Final Exercise Prescription Changes):   Nutrition:  Target Goals: Understanding of nutrition guidelines, daily intake of sodium 1500mg , cholesterol 200mg , calories 30% from fat and 7% or less from saturated fats, daily to have 5 or more servings of fruits and  vegetables.  Biometrics:  Pre Biometrics - 12/21/24 0740       Pre Biometrics   Waist Circumference 42 inches    Hip Circumference 40.25 inches    Waist to Hip Ratio 1.04 %    Triceps Skinfold 7 mm    % Body Fat 25 %    Grip Strength 38 kg    Flexibility 9.5 in    Single Leg Stand 29.18 seconds           Nutrition Therapy Plan and Nutrition Goals:   Nutrition Assessments:  MEDIFICTS Score Key: >=70 Need to make dietary changes  40-70 Heart Healthy Diet <= 40 Therapeutic Level Cholesterol Diet    Picture Your Plate Scores: <59 Unhealthy dietary pattern with much room for improvement. 41-50 Dietary pattern unlikely to meet recommendations for good health and room for improvement. 51-60 More healthful dietary pattern, with some room for improvement.  >60 Healthy dietary pattern, although there may be some specific behaviors that could be improved.    Nutrition Goals Re-Evaluation:   Nutrition Goals Re-Evaluation:   Nutrition Goals Discharge (Final Nutrition Goals Re-Evaluation):   Psychosocial: Target Goals: Acknowledge presence or absence of significant depression and/or stress, maximize coping skills, provide positive support system. Participant is able to verbalize types and ability to use techniques and skills needed for reducing stress and depression.  Initial Review & Psychosocial Screening:  Initial Psych Review & Screening - 12/21/24 0946       Initial Review  Current issues with Current Stress Concerns    Source of Stress Concerns Occupation;Chronic Illness    Comments Rodrecus says he has minimal stress      Family Dynamics   Good Support System? Yes   Kery lives alone. Delvon has his son and sister for support     Barriers   Psychosocial barriers to participate in program The patient should benefit from training in stress management and relaxation.      Screening Interventions   Interventions Encouraged to exercise    Expected Outcomes Long Term  Goal: Stressors or current issues are controlled or eliminated.          Quality of Life Scores:  Quality of Life - 12/21/24 0839       Quality of Life   Select Quality of Life      Quality of Life Scores   Health/Function Pre 28.68 %    Socioeconomic Pre 26.19 %    Psych/Spiritual Pre 29.14 %    Family Pre 27.6 %    GLOBAL Pre 28.03 %         Scores of 19 and below usually indicate a poorer quality of life in these areas.  A difference of  2-3 points is a clinically meaningful difference.  A difference of 2-3 points in the total score of the Quality of Life Index has been associated with significant improvement in overall quality of life, self-image, physical symptoms, and general health in studies assessing change in quality of life.  PHQ-9: Review Flowsheet  More data exists      12/21/2024 10/11/2024 09/15/2023 03/13/2023 09/26/2020  Depression screen PHQ 2/9  Decreased Interest 0 0 0 0 0 0  Down, Depressed, Hopeless 0 0 0 0 0 0  PHQ - 2 Score 0 0 0 0 0 0  Altered sleeping 0 - - 1 -  Tired, decreased energy 0 - - 1 -  Change in appetite 0 - - 0 -  Feeling bad or failure about yourself  0 - - 0 -  Trouble concentrating 0 - - 0 -  Moving slowly or fidgety/restless 0 - - 0 -  Suicidal thoughts 0 - - 0 -  PHQ-9 Score 0 - - 2  -  Difficult doing work/chores Not difficult at all - - Not difficult at all -    Details       Data saved with a previous flowsheet row definition   Multiple values from one day are sorted in reverse-chronological order        Interpretation of Total Score  Total Score Depression Severity:  1-4 = Minimal depression, 5-9 = Mild depression, 10-14 = Moderate depression, 15-19 = Moderately severe depression, 20-27 = Severe depression   Psychosocial Evaluation and Intervention:   Psychosocial Re-Evaluation:   Psychosocial Discharge (Final Psychosocial Re-Evaluation):   Vocational Rehabilitation: Provide vocational rehab assistance to  qualifying candidates.   Vocational Rehab Evaluation & Intervention:  Vocational Rehab - 12/21/24 0951       Initial Vocational Rehab Evaluation & Intervention   Assessment shows need for Vocational Rehabilitation No   Kairyn is currently on short term disability and hopes to return to work for [employer]. Hiroki hopes to return to work and does not need vocational rehab at this time.         Education: Education Goals: Education classes will be provided on a weekly basis, covering required topics. Participant will state understanding/return demonstration of topics presented.  Core Videos: Exercise    Move It!  Clinical staff conducted group or individual video education with verbal and written material and guidebook.  Patient learns the recommended Pritikin exercise program. Exercise with the goal of living a long, healthy life. Some of the health benefits of exercise include controlled diabetes, healthier blood pressure levels, improved cholesterol levels, improved heart and lung capacity, improved sleep, and better body composition. Everyone should speak with their doctor before starting or changing an exercise routine.  Biomechanical Limitations Clinical staff conducted group or individual video education with verbal and written material and guidebook.  Patient learns how biomechanical limitations can impact exercise and how we can mitigate and possibly overcome limitations to have an impactful and balanced exercise routine.  Body Composition Clinical staff conducted group or individual video education with verbal and written material and guidebook.  Patient learns that body composition (ratio of muscle mass to fat mass) is a key component to assessing overall fitness, rather than body weight alone. Increased fat mass, especially visceral belly fat, can put us  at increased risk for metabolic syndrome, type 2 diabetes, heart disease, and even death. It is  recommended to combine diet and exercise (cardiovascular and resistance training) to improve your body composition. Seek guidance from your physician and exercise physiologist before implementing an exercise routine.  Exercise Action Plan Clinical staff conducted group or individual video education with verbal and written material and guidebook.  Patient learns the recommended strategies to achieve and enjoy long-term exercise adherence, including variety, self-motivation, self-efficacy, and positive decision making. Benefits of exercise include fitness, good health, weight management, more energy, better sleep, less stress, and overall well-being.  Medical   Heart Disease Risk Reduction Clinical staff conducted group or individual video education with verbal and written material and guidebook.  Patient learns our heart is our most vital organ as it circulates oxygen, nutrients, white blood cells, and hormones throughout the entire body, and carries waste away. Data supports a plant-based eating plan like the Pritikin Program for its effectiveness in slowing progression of and reversing heart disease. The video provides a number of recommendations to address heart disease.   Metabolic Syndrome and Belly Fat  Clinical staff conducted group or individual video education with verbal and written material and guidebook.  Patient learns what metabolic syndrome is, how it leads to heart disease, and how one can reverse it and keep it from coming back. You have metabolic syndrome if you have 3 of the following 5 criteria: abdominal obesity, high blood pressure, high triglycerides, low HDL cholesterol, and high blood sugar.  Hypertension and Heart Disease Clinical staff conducted group or individual video education with verbal and written material and guidebook.  Patient learns that high blood pressure, or hypertension, is very common in the [place] . Hypertension is largely due to excessive salt  intake, but other important risk factors include being overweight, physical inactivity, drinking too much alcohol, smoking, and not eating enough potassium from fruits and vegetables. High blood pressure is a leading risk factor for heart attack, stroke, congestive heart failure, dementia, kidney failure, and premature death. Long-term effects of excessive salt intake include stiffening of the arteries and thickening of heart muscle and organ damage. Recommendations include ways to reduce hypertension and the risk of heart disease.  Diseases of Our Time - Focusing on Diabetes Clinical staff conducted group or individual video education with verbal and written material and guidebook.  Patient learns why the best way to stop diseases of our time  is prevention, through food and other lifestyle changes. Medicine (such as prescription pills and surgeries) is often only a Band-Aid on the problem, not a long-term solution. Most common diseases of our time include obesity, type 2 diabetes, hypertension, heart disease, and cancer. The Pritikin Program is recommended and has been proven to help reduce, reverse, and/or prevent the damaging effects of metabolic syndrome.  Nutrition   Overview of the Pritikin Eating Plan  Clinical staff conducted group or individual video education with verbal and written material and guidebook.  Patient learns about the Pritikin Eating Plan for disease risk reduction. The Pritikin Eating Plan emphasizes a wide variety of unrefined, minimally-processed carbohydrates, like fruits, vegetables, whole grains, and legumes. Go, Caution, and Stop food choices are explained. Plant-based and lean animal proteins are emphasized. Rationale provided for low sodium intake for blood pressure control, low added sugars for blood sugar stabilization, and low added fats and oils for coronary artery disease risk reduction and weight management.  Calorie Density  Clinical staff conducted group or  individual video education with verbal and written material and guidebook.  Patient learns about calorie density and how it impacts the Pritikin Eating Plan. Knowing the characteristics of the food you choose will help you decide whether those foods will lead to weight gain or weight loss, and whether you want to consume more or less of them. Weight loss is usually a side effect of the Pritikin Eating Plan because of its focus on low calorie-dense foods.  Label Reading  Clinical staff conducted group or individual video education with verbal and written material and guidebook.  Patient learns about the Pritikin recommended label reading guidelines and corresponding recommendations regarding calorie density, added sugars, sodium content, and whole grains.  Dining Out - Part 1  Clinical staff conducted group or individual video education with verbal and written material and guidebook.  Patient learns that restaurant meals can be sabotaging because they can be so high in calories, fat, sodium, and/or sugar. Patient learns recommended strategies on how to positively address this and avoid unhealthy pitfalls.  Facts on Fats  Clinical staff conducted group or individual video education with verbal and written material and guidebook.  Patient learns that lifestyle modifications can be just as effective, if not more so, as many medications for lowering your risk of heart disease. A Pritikin lifestyle can help to reduce your risk of inflammation and atherosclerosis (cholesterol build-up, or plaque, in the artery walls). Lifestyle interventions such as dietary choices and physical activity address the cause of atherosclerosis. A review of the types of fats and their impact on blood cholesterol levels, along with dietary recommendations to reduce fat intake is also included.  Nutrition Action Plan  Clinical staff conducted group or individual video education with verbal and written material and guidebook.   Patient learns how to incorporate Pritikin recommendations into their lifestyle. Recommendations include planning and keeping personal health goals in mind as an important part of their success.  Healthy Mind-Set    Healthy Minds, Bodies, Hearts  Clinical staff conducted group or individual video education with verbal and written material and guidebook.  Patient learns how to identify when they are stressed. Video will discuss the impact of that stress, as well as the many benefits of stress management. Patient will also be introduced to stress management techniques. The way we think, act, and feel has an impact on our hearts.  How Our Thoughts Can Heal Our Hearts  Clinical staff conducted group or individual video  education with verbal and written material and guidebook.  Patient learns that negative thoughts can cause depression and anxiety. This can result in negative lifestyle behavior and serious health problems. Cognitive behavioral therapy is an effective method to help control our thoughts in order to change and improve our emotional outlook.  Additional Videos:  Exercise    Improving Performance  Clinical staff conducted group or individual video education with verbal and written material and guidebook.  Patient learns to use a non-linear approach by alternating intensity levels and lengths of time spent exercising to help burn more calories and lose more body fat. Cardiovascular exercise helps improve heart health, metabolism, hormonal balance, blood sugar control, and recovery from fatigue. Resistance training improves strength, endurance, balance, coordination, reaction time, metabolism, and muscle mass. Flexibility exercise improves circulation, posture, and balance. Seek guidance from your physician and exercise physiologist before implementing an exercise routine and learn your capabilities and proper form for all exercise.  Introduction to Yoga  Clinical staff conducted group or  individual video education with verbal and written material and guidebook.  Patient learns about yoga, a discipline of the coming together of mind, breath, and body. The benefits of yoga include improved flexibility, improved range of motion, better posture and core strength, increased lung function, weight loss, and positive self-image. Yogas heart health benefits include lowered blood pressure, healthier heart rate, decreased cholesterol and triglyceride levels, improved immune function, and reduced stress. Seek guidance from your physician and exercise physiologist before implementing an exercise routine and learn your capabilities and proper form for all exercise.  Medical   Aging: Enhancing Your Quality of Life  Clinical staff conducted group or individual video education with verbal and written material and guidebook.  Patient learns key strategies and recommendations to stay in good physical health and enhance quality of life, such as prevention strategies, having an advocate, securing a Health Care Proxy and Power of Attorney, and keeping a list of medications and system for tracking them. It also discusses how to avoid risk for bone loss.  Biology of Weight Control  Clinical staff conducted group or individual video education with verbal and written material and guidebook.  Patient learns that weight gain occurs because we consume more calories than we burn (eating more, moving less). Even if your body weight is normal, you may have higher ratios of fat compared to muscle mass. Too much body fat puts you at increased risk for cardiovascular disease, heart attack, stroke, type 2 diabetes, and obesity-related cancers. In addition to exercise, following the Pritikin Eating Plan can help reduce your risk.  Decoding Lab Results  Clinical staff conducted group or individual video education with verbal and written material and guidebook.  Patient learns that lab test reflects one measurement whose  values change over time and are influenced by many factors, including medication, stress, sleep, exercise, food, hydration, pre-existing medical conditions, and more. It is recommended to use the knowledge from this video to become more involved with your lab results and evaluate your numbers to speak with your doctor.   Diseases of Our Time - Overview  Clinical staff conducted group or individual video education with verbal and written material and guidebook.  Patient learns that according to the CDC, 50% to 70% of chronic diseases (such as obesity, type 2 diabetes, elevated lipids, hypertension, and heart disease) are avoidable through lifestyle improvements including healthier food choices, listening to satiety cues, and increased physical activity.  Sleep Disorders Clinical staff conducted group or individual  video education with verbal and written material and guidebook.  Patient learns how good quality and duration of sleep are important to overall health and well-being. Patient also learns about sleep disorders and how they impact health along with recommendations to address them, including discussing with a physician.  Nutrition  Dining Out - Part 2 Clinical staff conducted group or individual video education with verbal and written material and guidebook.  Patient learns how to plan ahead and communicate in order to maximize their dining experience in a healthy and nutritious manner. Included are recommended food choices based on the type of restaurant the patient is visiting.   Fueling a Banker conducted group or individual video education with verbal and written material and guidebook.  There is a strong connection between our food choices and our health. Diseases like obesity and type 2 diabetes are very prevalent and are in large-part due to lifestyle choices. The Pritikin Eating Plan provides plenty of food and hunger-curbing satisfaction. It is easy to follow,  affordable, and helps reduce health risks.  Menu Workshop  Clinical staff conducted group or individual video education with verbal and written material and guidebook.  Patient learns that restaurant meals can sabotage health goals because they are often packed with calories, fat, sodium, and sugar. Recommendations include strategies to plan ahead and to communicate with the manager, chef, or server to help order a healthier meal.  Planning Your Eating Strategy  Clinical staff conducted group or individual video education with verbal and written material and guidebook.  Patient learns about the Pritikin Eating Plan and its benefit of reducing the risk of disease. The Pritikin Eating Plan does not focus on calories. Instead, it emphasizes high-quality, nutrient-rich foods. By knowing the characteristics of the foods, we choose, we can determine their calorie density and make informed decisions.  Targeting Your Nutrition Priorities  Clinical staff conducted group or individual video education with verbal and written material and guidebook.  Patient learns that lifestyle habits have a tremendous impact on disease risk and progression. This video provides eating and physical activity recommendations based on your personal health goals, such as reducing LDL cholesterol, losing weight, preventing or controlling type 2 diabetes, and reducing high blood pressure.  Vitamins and Minerals  Clinical staff conducted group or individual video education with verbal and written material and guidebook.  Patient learns different ways to obtain key vitamins and minerals, including through a recommended healthy diet. It is important to discuss all supplements you take with your doctor.   Healthy Mind-Set    Smoking Cessation  Clinical staff conducted group or individual video education with verbal and written material and guidebook.  Patient learns that cigarette smoking and tobacco addiction pose a serious health  risk which affects millions of people. Stopping smoking will significantly reduce the risk of heart disease, lung disease, and many forms of cancer. Recommended strategies for quitting are covered, including working with your doctor to develop a successful plan.  Culinary   Becoming a Set Designer conducted group or individual video education with verbal and written material and guidebook.  Patient learns that cooking at home can be healthy, cost-effective, quick, and puts them in control. Keys to cooking healthy recipes will include looking at your recipe, assessing your equipment needs, planning ahead, making it simple, choosing cost-effective seasonal ingredients, and limiting the use of added fats, salts, and sugars.  Cooking - Breakfast and Snacks  Clinical staff conducted group or individual  video education with verbal and written material and guidebook.  Patient learns how important breakfast is to satiety and nutrition through the entire day. Recommendations include key foods to eat during breakfast to help stabilize blood sugar levels and to prevent overeating at meals later in the day. Planning ahead is also a key component.  Cooking - Educational Psychologist conducted group or individual video education with verbal and written material and guidebook.  Patient learns eating strategies to improve overall health, including an approach to cook more at home. Recommendations include thinking of animal protein as a side on your plate rather than center stage and focusing instead on lower calorie dense options like vegetables, fruits, whole grains, and plant-based proteins, such as beans. Making sauces in large quantities to freeze for later and leaving the skin on your vegetables are also recommended to maximize your experience.  Cooking - Healthy Salads and Dressing Clinical staff conducted group or individual video education with verbal and written material and  guidebook.  Patient learns that vegetables, fruits, whole grains, and legumes are the foundations of the Pritikin Eating Plan. Recommendations include how to incorporate each of these in flavorful and healthy salads, and how to create homemade salad dressings. Proper handling of ingredients is also covered. Cooking - Soups and [employer] - Soups and Desserts Clinical staff conducted group or individual video education with verbal and written material and guidebook.  Patient learns that Pritikin soups and desserts make for easy, nutritious, and delicious snacks and meal components that are low in sodium, fat, sugar, and calorie density, while high in vitamins, minerals, and filling fiber. Recommendations include simple and healthy ideas for soups and desserts.   Overview     The Pritikin Solution Program Overview Clinical staff conducted group or individual video education with verbal and written material and guidebook.  Patient learns that the results of the Pritikin Program have been documented in more than 100 articles published in peer-reviewed journals, and the benefits include reducing risk factors for (and, in some cases, even reversing) high cholesterol, high blood pressure, type 2 diabetes, obesity, and more! An overview of the three key pillars of the Pritikin Program will be covered: eating well, doing regular exercise, and having a healthy mind-set.  WORKSHOPS  Exercise: Exercise Basics: Building Your Action Plan Clinical staff led group instruction and group discussion with PowerPoint presentation and patient guidebook. To enhance the learning environment the use of posters, models and videos may be added. At the conclusion of this workshop, patients will comprehend the difference between physical activity and exercise, as well as the benefits of incorporating both, into their routine. Patients will understand the FITT (Frequency, Intensity, Time, and Type) principle and how to  use it to build an exercise action plan. In addition, safety concerns and other considerations for exercise and cardiac rehab will be addressed by the presenter. The purpose of this lesson is to promote a comprehensive and effective weekly exercise routine in order to improve patients overall level of fitness.   Managing Heart Disease: Your Path to a Healthier Heart Clinical staff led group instruction and group discussion with PowerPoint presentation and patient guidebook. To enhance the learning environment the use of posters, models and videos may be added.At the conclusion of this workshop, patients will understand the anatomy and physiology of the heart. Additionally, they will understand how Pritikins three pillars impact the risk factors, the progression, and the management of heart disease.  The purpose of  this lesson is to provide a high-level overview of the heart, heart disease, and how the Pritikin lifestyle positively impacts risk factors.  Exercise Biomechanics Clinical staff led group instruction and group discussion with PowerPoint presentation and patient guidebook. To enhance the learning environment the use of posters, models and videos may be added. Patients will learn how the structural parts of their bodies function and how these functions impact their daily activities, movement, and exercise. Patients will learn how to promote a neutral spine, learn how to manage pain, and identify ways to improve their physical movement in order to promote healthy living. The purpose of this lesson is to expose patients to common physical limitations that impact physical activity. Participants will learn practical ways to adapt and manage aches and pains, and to minimize their effect on regular exercise. Patients will learn how to maintain good posture while sitting, walking, and lifting.  Balance Training and Fall Prevention  Clinical staff led group instruction and group discussion  with PowerPoint presentation and patient guidebook. To enhance the learning environment the use of posters, models and videos may be added. At the conclusion of this workshop, patients will understand the importance of their sensorimotor skills (vision, proprioception, and the vestibular system) in maintaining their ability to balance as they age. Patients will apply a variety of balancing exercises that are appropriate for their current level of function. Patients will understand the common causes for poor balance, possible solutions to these problems, and ways to modify their physical environment in order to minimize their fall risk. The purpose of this lesson is to teach patients about the importance of maintaining balance as they age and ways to minimize their risk of falling.  WORKSHOPS   Nutrition:  Fueling a Ship Broker led group instruction and group discussion with PowerPoint presentation and patient guidebook. To enhance the learning environment the use of posters, models and videos may be added. Patients will review the foundational principles of the Pritikin Eating Plan and understand what constitutes a serving size in each of the food groups. Patients will also learn Pritikin-friendly foods that are better choices when away from home and review make-ahead meal and snack options. Calorie density will be reviewed and applied to three nutrition priorities: weight maintenance, weight loss, and weight gain. The purpose of this lesson is to reinforce (in a group setting) the key concepts around what patients are recommended to eat and how to apply these guidelines when away from home by planning and selecting Pritikin-friendly options. Patients will understand how calorie density may be adjusted for different weight management goals.  Mindful Eating  Clinical staff led group instruction and group discussion with PowerPoint presentation and patient guidebook. To enhance the  learning environment the use of posters, models and videos may be added. Patients will briefly review the concepts of the Pritikin Eating Plan and the importance of low-calorie dense foods. The concept of mindful eating will be introduced as well as the importance of paying attention to internal hunger signals. Triggers for non-hunger eating and techniques for dealing with triggers will be explored. The purpose of this lesson is to provide patients with the opportunity to review the basic principles of the Pritikin Eating Plan, discuss the value of eating mindfully and how to measure internal cues of hunger and fullness using the Hunger Scale. Patients will also discuss reasons for non-hunger eating and learn strategies to use for controlling emotional eating.  Targeting Your Nutrition Priorities Clinical staff led group instruction  and group discussion with PowerPoint presentation and patient guidebook. To enhance the learning environment the use of posters, models and videos may be added. Patients will learn how to determine their genetic susceptibility to disease by reviewing their family history. Patients will gain insight into the importance of diet as part of an overall healthy lifestyle in mitigating the impact of genetics and other environmental insults. The purpose of this lesson is to provide patients with the opportunity to assess their personal nutrition priorities by looking at their family history, their own health history and current risk factors. Patients will also be able to discuss ways of prioritizing and modifying the Pritikin Eating Plan for their highest risk areas  Menu  Clinical staff led group instruction and group discussion with PowerPoint presentation and patient guidebook. To enhance the learning environment the use of posters, models and videos may be added. Using menus brought in from [employer], or printed from [employer], patients will apply the Pritikin dining out  guidelines that were presented in the [employer] video. Patients will also be able to practice these guidelines in a variety of provided scenarios. The purpose of this lesson is to provide patients with the opportunity to practice hands-on learning of the Pritikin Dining Out guidelines with actual menus and practice scenarios.  Label Reading Clinical staff led group instruction and group discussion with PowerPoint presentation and patient guidebook. To enhance the learning environment the use of posters, models and videos may be added. Patients will review and discuss the Pritikin label reading guidelines presented in Pritikins Label Reading Educational series video. Using fool labels brought in from local grocery stores and markets, patients will apply the label reading guidelines and determine if the packaged food meet the Pritikin guidelines. The purpose of this lesson is to provide patients with the opportunity to review, discuss, and practice hands-on learning of the Pritikin Label Reading guidelines with actual packaged food labels. Cooking School  Pritikins [employer] are designed to teach patients ways to prepare quick, simple, and affordable recipes at home. The importance of nutritions role in chronic disease risk reduction is reflected in its emphasis in the overall Pritikin program. By learning how to prepare essential core Pritikin Eating Plan recipes, patients will increase control over what they eat; be able to customize the flavor of foods without the use of added salt, sugar, or fat; and improve the quality of the food they consume. By learning a set of core recipes which are easily assembled, quickly prepared, and affordable, patients are more likely to prepare more healthy foods at home. These workshops focus on convenient breakfasts, simple entres, side dishes, and desserts which can be prepared with minimal effort and are consistent with nutrition  recommendations for cardiovascular risk reduction. Cooking [employer] are taught by a armed forces logistics/support/administrative officer (RD) who has been trained by the [employer]. The chef or RD has a clear understanding of the importance of minimizing - if not completely eliminating - added fat, sugar, and sodium in recipes. Throughout the series of Cooking School Workshop sessions, patients will learn about healthy ingredients and efficient methods of cooking to build confidence in their capability to prepare    Cooking School weekly topics:  Adding Flavor- Sodium-Free  Fast and Healthy Breakfasts  Powerhouse Plant-Based Proteins  Satisfying Salads and Dressings  Simple Sides and Sauces  International Cuisine-Spotlight on the [employer] Zones  Delicious Desserts  Savory Soups  [employer] - Meals in  a Snap  Tasty Appetizers and Snacks  Comforting Weekend Breakfasts  One-Pot Wonders   Fast Evening Meals  Landscape Architect Your Pritikin Plate  WORKSHOPS   Healthy Mindset (Psychosocial):  Focused Goals, Sustainable Changes Clinical staff led group instruction and group discussion with PowerPoint presentation and patient guidebook. To enhance the learning environment the use of posters, models and videos may be added. Patients will be able to apply effective goal setting strategies to establish at least one personal goal, and then take consistent, meaningful action toward that goal. They will learn to identify common barriers to achieving personal goals and develop strategies to overcome them. Patients will also gain an understanding of how our mind-set can impact our ability to achieve goals and the importance of cultivating a positive and growth-oriented mind-set. The purpose of this lesson is to provide patients with a deeper understanding of how to set and achieve personal goals, as well as the tools and strategies needed to overcome common obstacles which may arise along  the way.  From Head to Heart: The Power of a Healthy Outlook  Clinical staff led group instruction and group discussion with PowerPoint presentation and patient guidebook. To enhance the learning environment the use of posters, models and videos may be added. Patients will be able to recognize and describe the impact of emotions and mood on physical health. They will discover the importance of self-care and explore self-care practices which may work for them. Patients will also learn how to utilize the 4 Cs to cultivate a healthier outlook and better manage stress and challenges. The purpose of this lesson is to demonstrate to patients how a healthy outlook is an essential part of maintaining good health, especially as they continue their cardiac rehab journey.  Healthy Sleep for a Healthy Heart Clinical staff led group instruction and group discussion with PowerPoint presentation and patient guidebook. To enhance the learning environment the use of posters, models and videos may be added. At the conclusion of this workshop, patients will be able to demonstrate knowledge of the importance of sleep to overall health, well-being, and quality of life. They will understand the symptoms of, and treatments for, common sleep disorders. Patients will also be able to identify daytime and nighttime behaviors which impact sleep, and they will be able to apply these tools to help manage sleep-related challenges. The purpose of this lesson is to provide patients with a general overview of sleep and outline the importance of quality sleep. Patients will learn about a few of the most common sleep disorders. Patients will also be introduced to the concept of sleep hygiene, and discover ways to self-manage certain sleeping problems through simple daily behavior changes. Finally, the workshop will motivate patients by clarifying the links between quality sleep and their goals of heart-healthy living.   Recognizing and  Reducing Stress Clinical staff led group instruction and group discussion with PowerPoint presentation and patient guidebook. To enhance the learning environment the use of posters, models and videos may be added. At the conclusion of this workshop, patients will be able to understand the types of stress reactions, differentiate between acute and chronic stress, and recognize the impact that chronic stress has on their health. They will also be able to apply different coping mechanisms, such as reframing negative self-talk. Patients will have the opportunity to practice a variety of stress management techniques, such as deep abdominal breathing, progressive muscle relaxation, and/or guided imagery.  The purpose of this lesson is to educate  patients on the role of stress in their lives and to provide healthy techniques for coping with it.  Learning Barriers/Preferences:  Learning Barriers/Preferences - 12/21/24 0950       Learning Barriers/Preferences   Learning Barriers Exercise Concerns   Terry says he sometimes get lightheaded when changing positions   Learning Preferences Pictoral;Written Material          Education Topics:  Knowledge Questionnaire Score:  Knowledge Questionnaire Score - 12/21/24 0839       Knowledge Questionnaire Score   Pre Score 22/24          Core Components/Risk Factors/Patient Goals at Admission:  Personal Goals and Risk Factors at Admission - 12/21/24 0840       Core Components/Risk Factors/Patient Goals on Admission    Weight Management Yes;Obesity;Weight Loss    Intervention Weight Management/Obesity: Establish reasonable short term and long term weight goals.;Obesity: Provide education and appropriate resources to help participant work on and attain dietary goals.    Expected Outcomes Weight Loss: Understanding of general recommendations for a balanced deficit meal plan, which promotes 1-2 lb weight loss per week and includes a negative energy balance  of [phone] kcal/d    Diabetes Yes    Intervention Provide education about signs/symptoms and action to take for hypo/hyperglycemia.;Provide education about proper nutrition, including hydration, and aerobic/resistive exercise prescription along with prescribed medications to achieve blood glucose in normal ranges: Fasting glucose 65-99 mg/dL    Expected Outcomes Short Term: Participant verbalizes understanding of the signs/symptoms and immediate care of hyper/hypoglycemia, proper foot care and importance of medication, aerobic/resistive exercise and nutrition plan for blood glucose control.;Long Term: Attainment of HbA1C < 7%.    Heart Failure Yes    Intervention Provide a combined exercise and nutrition program that is supplemented with education, support and counseling about heart failure. Directed toward relieving symptoms such as shortness of breath, decreased exercise tolerance, and extremity edema.    Expected Outcomes Improve functional capacity of life;Short term: Attendance in program 2-3 days a week with increased exercise capacity. Reported lower sodium intake. Reported increased fruit and vegetable intake. Reports medication compliance.;Short term: Daily weights obtained and reported for increase. Utilizing diuretic protocols set by physician.;Long term: Adoption of self-care skills and reduction of barriers for early signs and symptoms recognition and intervention leading to self-care maintenance.    Hypertension Yes    Intervention Provide education on lifestyle modifcations including regular physical activity/exercise, weight management, moderate sodium restriction and increased consumption of fresh fruit, vegetables, and low fat dairy, alcohol moderation, and smoking cessation.;Monitor prescription use compliance.    Expected Outcomes Short Term: Continued assessment and intervention until BP is < 140/16mm HG in hypertensive participants. < 130/4mm HG in hypertensive participants with  diabetes, heart failure or chronic kidney disease.;Long Term: Maintenance of blood pressure at goal levels.    Lipids Yes    Intervention Provide education and support for participant on nutrition & aerobic/resistive exercise along with prescribed medications to achieve LDL 70mg , HDL >40mg .    Expected Outcomes Short Term: Participant states understanding of desired cholesterol values and is compliant with medications prescribed. Participant is following exercise prescription and nutrition guidelines.;Long Term: Cholesterol controlled with medications as prescribed, with individualized exercise RX and with personalized nutrition plan. Value goals: LDL < 70mg , HDL > 40 mg.          Core Components/Risk Factors/Patient Goals Review:    Core Components/Risk Factors/Patient Goals at Discharge (Final Review):    ITP Comments:  ITP  Comments     Row Name 12/21/24 0740           ITP Comments Medical Director- Dr. Wilbert Bihari, MD. Introduction to the Pritikin Education/ Intensive Cardiac Rehab Program. Reviewed initial orientation folder with Koren.          Comments: Minas attended orientation for the cardiac rehabilitation program on  12/21/2024  to perform initial intake and exercise walk test. Patient introduced to the Pritikin Program education and orientation packet was reviewed. Completed 6-minute walk test, measurements, initial ITP, and exercise prescription. Vital signs stable. Telemetry-normal sinus tach with PVC's this has been previously documented, asymptomatic. Cebert is concerned that he will not be able to participate in cardiac rehab in January 2026 due to cost/finances. He plans to discuss with the heart failure team at his schedule appointment this morning.Hadassah Elpidio Quan RN BSN   Service time was from [phone] to [phone].        [1]  Current Outpatient Medications:    Accu-Chek Softclix Lancets lancets, Use as instructed, Disp: 100 each, Rfl: 12   atorvastatin  (LIPITOR) 40  MG tablet, Take 1 tablet (40 mg total) by mouth daily., Disp: 90 tablet, Rfl: 3   empagliflozin  (JARDIANCE ) 10 MG TABS tablet, Take 1 tablet (10 mg total) by mouth daily., Disp: 30 tablet, Rfl: 6   furosemide  (LASIX ) 20 MG tablet, Take 1 tablet (20 mg total) by mouth daily as needed for fluid or edema., Disp: 30 tablet, Rfl: 3   gabapentin  (NEURONTIN ) 300 MG capsule, TAKE 1 CAPSULE (300MG  TOTAL) BY MOUTH AT BEDTIME AS NEEDED, Disp: 90 capsule, Rfl: 0   glucose blood (ACCU-CHEK GUIDE TEST) test strip, Use to test blood glucose before meals and at bedtime, Disp: 300 each, Rfl: 12   metFORMIN  (GLUCOPHAGE -XR) 500 MG 24 hr tablet, Take 1 tablet (500 mg total) by mouth daily with supper., Disp: 90 tablet, Rfl: 3   metoprolol  succinate (TOPROL -XL) 25 MG 24 hr tablet, Take 1 tablet (25 mg total) by mouth daily. Take with or immediately following a meal., Disp: 30 tablet, Rfl: 5   Multiple Vitamins-Minerals (CENTRUM SILVER MEN 50+ PO), Take 1 tablet by mouth daily., Disp: , Rfl:    sacubitril -valsartan  (ENTRESTO ) 49-51 MG, Take 1 tablet by mouth 2 (two) times daily., Disp: 60 tablet, Rfl: 3   spironolactone  (ALDACTONE ) 25 MG tablet, Take 0.5 tablets (12.5 mg total) by mouth daily., Disp: 30 tablet, Rfl: 6   tirzepatide  (MOUNJARO ) 15 MG/0.5ML Pen, Inject 15 mg into the skin once a week., Disp: 6 mL, Rfl: 4   tamsulosin  (FLOMAX ) 0.4 MG CAPS capsule, Take 1 capsule (0.4 mg total) by mouth daily. (Patient not taking: Reported on 12/21/2024), Disp: 90 capsule, Rfl: 3 [2]  Social History Tobacco Use  Smoking Status Never  Smokeless Tobacco Never

## 2024-12-22 ENCOUNTER — Telehealth (HOSPITAL_COMMUNITY): Payer: Self-pay

## 2024-12-22 NOTE — Telephone Encounter (Signed)
 Patient called and stated that he wanted to cancel remaining sessions and discontinue the program.  He stated that it was too expensive.

## 2024-12-27 ENCOUNTER — Encounter (HOSPITAL_COMMUNITY)

## 2024-12-29 ENCOUNTER — Encounter (HOSPITAL_COMMUNITY)

## 2025-01-03 ENCOUNTER — Encounter (HOSPITAL_COMMUNITY)

## 2025-01-05 ENCOUNTER — Encounter (HOSPITAL_COMMUNITY)

## 2025-01-10 ENCOUNTER — Encounter (HOSPITAL_COMMUNITY)

## 2025-01-12 ENCOUNTER — Encounter (HOSPITAL_COMMUNITY)

## 2025-01-13 NOTE — Telephone Encounter (Signed)
 Telephone number to this request was not in service.

## 2025-01-17 ENCOUNTER — Encounter (HOSPITAL_COMMUNITY)

## 2025-01-18 NOTE — Addendum Note (Signed)
 Encounter addended by: Debarah Garrison MATSU, RN on: 01/18/2025 12:11 PM  Actions taken: Imaging Exam ended

## 2025-01-19 ENCOUNTER — Encounter (HOSPITAL_COMMUNITY)

## 2025-01-21 ENCOUNTER — Ambulatory Visit (HOSPITAL_COMMUNITY)

## 2025-01-24 ENCOUNTER — Encounter (HOSPITAL_COMMUNITY)

## 2025-01-26 ENCOUNTER — Encounter (HOSPITAL_COMMUNITY)

## 2025-01-31 ENCOUNTER — Encounter (HOSPITAL_COMMUNITY)

## 2025-02-02 ENCOUNTER — Encounter (HOSPITAL_COMMUNITY)

## 2025-02-03 ENCOUNTER — Telehealth (HOSPITAL_COMMUNITY): Payer: Self-pay

## 2025-02-03 NOTE — Telephone Encounter (Signed)
 Called to confirm/remind patient of their appointment at the [facility] on 02/04/25.   Appointment:   [] Confirmed  [x] Left mess   [] No answer/No voice mail  [] VM Full/unable to leave message  [] Phone not in service  And to bring in all medications and/or complete list.

## 2025-02-04 ENCOUNTER — Ambulatory Visit (HOSPITAL_COMMUNITY): Admission: RE | Admit: 2025-02-04

## 2025-02-04 ENCOUNTER — Ambulatory Visit (HOSPITAL_COMMUNITY): Payer: Self-pay | Admitting: Family Medicine

## 2025-02-04 ENCOUNTER — Encounter (HOSPITAL_COMMUNITY): Payer: Self-pay

## 2025-02-04 ENCOUNTER — Other Ambulatory Visit (HOSPITAL_COMMUNITY): Payer: Self-pay

## 2025-02-04 VITALS — BP 124/76 | HR 98 | Wt 246.0 lb

## 2025-02-04 DIAGNOSIS — I3139 Other pericardial effusion (noninflammatory): Secondary | ICD-10-CM

## 2025-02-04 DIAGNOSIS — I10 Essential (primary) hypertension: Secondary | ICD-10-CM

## 2025-02-04 DIAGNOSIS — N1831 Chronic kidney disease, stage 3a: Secondary | ICD-10-CM

## 2025-02-04 DIAGNOSIS — I5022 Chronic systolic (congestive) heart failure: Secondary | ICD-10-CM

## 2025-02-04 DIAGNOSIS — E119 Type 2 diabetes mellitus without complications: Secondary | ICD-10-CM

## 2025-02-04 LAB — CBC
HCT: 50.4 % (ref 39.0–52.0)
Hemoglobin: 16.2 g/dL (ref 13.0–17.0)
MCH: 26.9 pg (ref 26.0–34.0)
MCHC: 32.1 g/dL (ref 30.0–36.0)
MCV: 83.6 fL (ref 80.0–100.0)
Platelets: 495 10*3/uL — ABNORMAL HIGH (ref 150–400)
RBC: 6.03 MIL/uL — ABNORMAL HIGH (ref 4.22–5.81)
RDW: 18.7 % — ABNORMAL HIGH (ref 11.5–15.5)
WBC: 6.9 10*3/uL (ref 4.0–10.5)
nRBC: 0 % (ref 0.0–0.2)

## 2025-02-04 LAB — BASIC METABOLIC PANEL WITH GFR
Anion gap: 13 (ref 5–15)
BUN: 25 mg/dL — ABNORMAL HIGH (ref 6–20)
CO2: 25 mmol/L (ref 22–32)
Calcium: 9.7 mg/dL (ref 8.9–10.3)
Chloride: 101 mmol/L (ref 98–111)
Creatinine, Ser: 1.76 mg/dL — ABNORMAL HIGH (ref 0.61–1.24)
GFR, Estimated: 45 mL/min — ABNORMAL LOW
Glucose, Bld: 160 mg/dL — ABNORMAL HIGH (ref 70–99)
Potassium: 4.3 mmol/L (ref 3.5–5.1)
Sodium: 139 mmol/L (ref 135–145)

## 2025-02-04 LAB — IRON AND TIBC
Iron: 54 ug/dL (ref 45–182)
Saturation Ratios: 13 % — ABNORMAL LOW (ref 17.9–39.5)
TIBC: 407 ug/dL (ref 250–450)
UIBC: 353 ug/dL

## 2025-02-04 LAB — FERRITIN: Ferritin: 33 ng/mL (ref 24–336)

## 2025-02-04 NOTE — Addendum Note (Signed)
 Encounter addended by: Glena Harlene HERO, FNP on: 02/04/2025 11:33 AM  Actions taken: Clinical Note Signed

## 2025-02-04 NOTE — Telephone Encounter (Signed)
 Patient is agreeable to proceed with iron infusion

## 2025-02-04 NOTE — Telephone Encounter (Signed)
 Referral sent

## 2025-02-04 NOTE — Progress Notes (Addendum)
 "   [facility] Note  PCP: [name] ORN, MD  Cardiologist: Darryle ONEIDA Decent, MD HF Cardiologist: Dr. Cherrie  HPI: Raymond Ryan is a 59 y.o. male with history of obesity s/p gastric bypass on tizepatide, CKD 3a, T2DM, HTN, and chronic HFrEF.    Admitted 10/25 with chest pain and dyspnea. CTPE negative. Found to have new systolic heart failure. EF 30-35% with RWMA and a moderate pericardial effusion. R/LHC with elevated filling pressures w/o obstructive coronary disease. 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 He was started on GDMT; cardiac PET scan and genetic testing reccomended in OP.   2 week Zio placed 1/26 after her had a syncopal event after standing, which showed mostly NSR, 3 SVT runs (fasted lasting 4 seconds with max rate of 146 bpm), rare PVCs.  Today he returns for HF follow up. Overall feeling fatigued. Has SOB walking up steps, does OK with ADLs and walking short distances on flat ground. No further syncope. Feels occasional palpitations. Denies abnormal bleeding, CP, dizziness, edema, or PND/Orthopnea. Appetite ok. Weight at home 240 pounds. Taking all medications. No tobacco, ETOH, or drug use. 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  Cardiac Testing:  - cPET 11/25: EF 30%, no sarcoid  - CMR 10/25: LVEF 27%, RVEF 29%, mod MR, NICM LGE pattern in 12% LV myocardium, LGE also with triangle sign consistent with sarcoidosis. - R/LHC 10/25: nonobstructive CAD, NICM, RA 9, pa 58/28 (42), pcw 21, co/ci 6.6/2.8 - Echo 10/25: EF 30-35%, G3DD, nl RV, mod pericardial effusion   Past Medical History:  Diagnosis Date   Charcot foot due to diabetes mellitus (HCC)    CHF (congestive heart failure) (HCC)    Chronic kidney disease    Constipation    sometimes soft, some hard -- does go daily - are on fiber    Diabetes mellitus    GERD (gastroesophageal reflux  disease)    Headache    Hyperlipidemia    Hypertension    Neuromuscular disorder (HCC)    neuropathy   Current Outpatient Medications  Medication Sig Dispense Refill   Accu-Chek Softclix Lancets lancets Use as instructed 100 each 12   atorvastatin  (LIPITOR) 40 MG tablet Take 1 tablet (40 mg total) by mouth daily. 90 tablet 3   empagliflozin  (JARDIANCE ) 10 MG TABS tablet Take 1 tablet (10 mg total) by mouth daily. 30 tablet 6   furosemide  (LASIX ) 20 MG tablet Take 1 tablet (20 mg total) by mouth daily as needed for fluid or edema. 30 tablet 3   gabapentin  (NEURONTIN ) 300 MG capsule TAKE 1 CAPSULE (300MG  TOTAL) BY MOUTH AT BEDTIME AS NEEDED 90 capsule 0   glucose blood (ACCU-CHEK GUIDE TEST) test strip Use to test blood glucose before meals and at bedtime 300 each 12   metFORMIN  (GLUCOPHAGE -XR) 500 MG 24 hr tablet Take 1 tablet (500 mg total) by mouth daily with supper. 90 tablet 3   metoprolol  succinate (TOPROL -XL) 25 MG 24 hr tablet Take 1 tablet (25 mg total) by mouth 2 (two) times daily. Take with or immediately following a meal. 60 tablet 5   Multiple Vitamins-Minerals (CENTRUM SILVER MEN 50+ PO) Take 1 tablet by mouth daily.     sacubitril -valsartan  (ENTRESTO ) 49-51 MG Take 1 tablet by mouth 2 (two) times daily. 60 tablet 3   spironolactone  (ALDACTONE ) 25 MG tablet Take 0.5 tablets (12.5  mg total) by mouth daily. 30 tablet 6   tirzepatide  (MOUNJARO ) 15 MG/0.5ML Pen Inject 15 mg into the skin once a week. 6 mL 4   tamsulosin  (FLOMAX ) 0.4 MG CAPS capsule Take 1 capsule (0.4 mg total) by mouth daily. (Patient not taking: Reported on 02/04/2025) 90 capsule 3   No current facility-administered medications for this encounter.   No Known Allergies  Social History   Socioeconomic History   Marital status: Single    Spouse name: Not on file   Number of children: 1   Years of education: 12   Highest education level: High school graduate  Occupational History   Not on file  Tobacco Use    Smoking status: Never   Smokeless tobacco: Never  Vaping Use   Vaping status: Never Used  Substance and Sexual Activity   Alcohol use: No   Drug use: No   Sexual activity: Not on file  Other Topics Concern   Not on file  Social History Narrative   Not on file   Social Drivers of Health   Tobacco Use: Low Risk (12/21/2024)   Patient History    Smoking Tobacco Use: Never    Smokeless Tobacco Use: Never    Passive Exposure: Not on file  Financial Resource Strain: High Risk (12/21/2024)   Overall Financial Resource Strain (CARDIA)    Difficulty of Paying Living Expenses: Hard  Food Insecurity: No Food Insecurity (10/11/2024)   Epic    Worried About Programme Researcher, Broadcasting/film/video in the Last Year: Never true    Ran Out of Food in the Last Year: Never true  Transportation Needs: No Transportation Needs (10/11/2024)   Epic    Lack of Transportation (Medical): No    Lack of Transportation (Non-Medical): No  Physical Activity: Not on file  Stress: Not on file  Social Connections: Not on file  Intimate Partner Violence: Not At Risk (10/11/2024)   Epic    Fear of Current or Ex-Partner: No    Emotionally Abused: No    Physically Abused: No    Sexually Abused: No  Depression (PHQ2-9): Low Risk (12/21/2024)   Depression (PHQ2-9)    PHQ-2 Score: 0  Alcohol Screen: Low Risk (10/05/2024)   Alcohol Screen    Last Alcohol Screening Score (AUDIT): 0  Housing: Unknown (10/11/2024)   Epic    Unable to Pay for Housing in the Last Year: No    Number of Times Moved in the Last Year: Not on file    Homeless in the Last Year: No  Utilities: Not At Risk (10/11/2024)   Epic    Threatened with loss of utilities: No  Health Literacy: Not on file   Family History  Problem Relation Age of Onset   Diabetes Mellitus II Mother    CAD Mother    Prostate cancer Father    Prostate cancer Maternal Uncle    Prostate cancer Maternal Uncle    Colon cancer Neg Hx    Colon polyps Neg Hx    Wt Readings from  Last 3 Encounters:  02/04/25 111.6 kg (246 lb)  12/21/24 109.5 kg (241 lb 6.4 oz)  12/21/24 110.3 kg (243 lb 2.7 oz)   BP 124/76   Pulse 98   Wt 111.6 kg (246 lb)   SpO2 97%   BMI 30.14 kg/m   PHYSICAL EXAM: General:  NAD. No resp difficulty, walked into clinic HEENT: Normal Neck: Supple. No JVD. Cor: Regular rate & rhythm. No rubs, gallops or  murmurs. Lungs: Clear Abdomen: Soft, nontender, nondistended.  Extremities: No cyanosis, clubbing, rash, edema Neuro: Alert & oriented x 3, moves all 4 extremities w/o difficulty. Affect pleasant.   ASSESSMENT & PLAN: Chronic Biventricular HFrEF; NICM - Echo 10/25 with EF 30-35%, G3DD, and normal RV  (new onset) - R/LHC 10/25: No CAD; RA 9, PA 58/28 (42), PCW 21, CO/CI 6.6/2.8 - CMR 10/25: LVEF 27%, RVEF 29%, LGE with triangle sign concerning for cardiac sarcoid - cPET 11/25 EF 30% no sarcoid  - Doubt sarcoid cause of CM. ?HTN, If EF not improving with GDMT will need ICD eval and genetic testing - NYHA I-II, fatigue main symptoms. Volume status stable.  - Continue Jardiance  10 mg daily. - Continue Toprol  XL 25 mg bid. Will not increase with fatigue - Continue Entresto  49/51 mg bid - Continue spironolactone  12.5 mg daily. - Repeat echo. If EF remains < 35% will refer to EP for ICD. Narrow QRS, CRTD not indicated.  - Cardiac rehab is cost prohibitive. Discussed staying active - Labs today. - Arrange for genetic testing. He has life insurance. We discussed implications of genetic testing with respect to life insurance.  Pericardial effusion - mod on 10/25 echo - Small pericardial effusion 10/29/205 resolved  HTN - BP well controlled - Meds as above  CKD 3a - baseline Cr fluctuant ~1.3-1.8 - Continue Jardiance  - BMET today  T2DM - well controlled - A1C 6.1  H/o Obesity - s/p gastric bypass sx - Body mass index is 30.14 kg/m. - on tirzepatide   Syncope - suspect orthostatic  - 2 week Zio reassuring - No further  events  Follow up in 3 months with Dr. Bensimhon. He left work paperwork in office today for employer.  Harlene HERO [place], FNP 02/04/25  "

## 2025-02-04 NOTE — Patient Instructions (Addendum)
 Good to see you today!  Your physician has requested that you have an echocardiogram. Echocardiography is a painless test that uses sound waves to create images of your heart. It provides your doctor with information about the size and shape of your heart and how well your hearts chambers and valves are working. This procedure takes approximately one hour. There are no restrictions for this procedure. Please do NOT wear cologne, perfume, aftershave, or lotions (deodorant is allowed). Please arrive 15 minutes prior to your appointment time.  Please note: We ask at that you not bring children with you during ultrasound (echo/ vascular) testing. Due to room size and safety concerns, children are not allowed in the ultrasound rooms during exams. Our front office staff cannot provide observation of children in our lobby area while testing is being conducted. An adult accompanying a patient to their appointment will only be allowed in the ultrasound room at the discretion of the ultrasound technician under special circumstances. We apologize for any inconvenience.  Your physician recommends that you schedule a follow-up appointment as scheduled  Genetic testing has been collected, this has to be sent to [place]  for processing and can take 1-2 weeks for us  to get results back.  We will let you know the results once reviewed by your provider.  If you have any questions or concerns before your next appointment please send us  a message through [facility] or call our office at [phone].    TO LEAVE A MESSAGE FOR THE NURSE SELECT OPTION 2, PLEASE LEAVE A MESSAGE INCLUDING: YOUR NAME DATE OF BIRTH CALL BACK NUMBER REASON FOR CALL**this is important as we prioritize the call backs  YOU WILL RECEIVE A CALL BACK THE SAME DAY AS LONG AS YOU CALL BEFORE 4:00 PM At the [facility], you and your health needs are our priority. As part of our continuing mission to provide you with exceptional  heart care, we have created designated Provider Care Teams. These Care Teams include your primary Cardiologist (physician) and Advanced Practice Providers (APPs- Physician Assistants and Nurse Practitioners) who all work together to provide you with the care you need, when you need it.   You may see any of the following providers on your designated Care Team at your next follow up: Dr Toribio Fuel Dr Ezra Shuck Dr. Morene Brownie Greig Mosses, NP Caffie Shed, [place] [facility], [place] Beckey Coe, NP Jordan Lee, NP Ellouise Class, NP Tinnie Redman, PharmD Jaun Bash, PharmD   Please be sure to bring in all your medications bottles to every appointment.    Thank you for choosing [facility]

## 2025-02-04 NOTE — Telephone Encounter (Signed)
-----   Message from Rolin LOISE Height, CMA sent at 02/04/2025  2:45 PM EST -----

## 2025-02-07 ENCOUNTER — Encounter (HOSPITAL_COMMUNITY)

## 2025-02-09 ENCOUNTER — Encounter (HOSPITAL_COMMUNITY)

## 2025-02-14 ENCOUNTER — Encounter (HOSPITAL_COMMUNITY)

## 2025-02-16 ENCOUNTER — Encounter (HOSPITAL_COMMUNITY)

## 2025-02-21 ENCOUNTER — Encounter (HOSPITAL_COMMUNITY)

## 2025-02-23 ENCOUNTER — Encounter (HOSPITAL_COMMUNITY)

## 2025-02-24 ENCOUNTER — Ambulatory Visit (HOSPITAL_COMMUNITY)

## 2025-02-28 ENCOUNTER — Encounter (HOSPITAL_COMMUNITY)

## 2025-03-02 ENCOUNTER — Encounter (HOSPITAL_COMMUNITY)

## 2025-03-07 ENCOUNTER — Encounter (HOSPITAL_COMMUNITY)

## 2025-03-09 ENCOUNTER — Encounter (HOSPITAL_COMMUNITY)

## 2025-03-14 ENCOUNTER — Encounter (HOSPITAL_COMMUNITY)

## 2025-03-16 ENCOUNTER — Encounter (HOSPITAL_COMMUNITY)

## 2025-05-02 ENCOUNTER — Ambulatory Visit (HOSPITAL_COMMUNITY): Admitting: Internal Medicine
